# Patient Record
Sex: Female | Born: 1935
Health system: Southern US, Community
[De-identification: ages and names within clinical notes are randomized; demographics above are authoritative.]

## PROBLEM LIST (undated history)

## (undated) DIAGNOSIS — Z9289 Personal history of other medical treatment: Secondary | ICD-10-CM

## (undated) DIAGNOSIS — F329 Major depressive disorder, single episode, unspecified: Secondary | ICD-10-CM

## (undated) DIAGNOSIS — M545 Low back pain, unspecified: Secondary | ICD-10-CM

## (undated) DIAGNOSIS — B029 Zoster without complications: Secondary | ICD-10-CM

## (undated) DIAGNOSIS — R438 Other disturbances of smell and taste: Secondary | ICD-10-CM

## (undated) DIAGNOSIS — E039 Hypothyroidism, unspecified: Secondary | ICD-10-CM

## (undated) DIAGNOSIS — R7301 Impaired fasting glucose: Secondary | ICD-10-CM

## (undated) DIAGNOSIS — K589 Irritable bowel syndrome without diarrhea: Secondary | ICD-10-CM

## (undated) DIAGNOSIS — E559 Vitamin D deficiency, unspecified: Secondary | ICD-10-CM

## (undated) DIAGNOSIS — E041 Nontoxic single thyroid nodule: Secondary | ICD-10-CM

## (undated) DIAGNOSIS — R011 Cardiac murmur, unspecified: Secondary | ICD-10-CM

## (undated) DIAGNOSIS — I421 Obstructive hypertrophic cardiomyopathy: Secondary | ICD-10-CM

## (undated) DIAGNOSIS — I10 Essential (primary) hypertension: Secondary | ICD-10-CM

## (undated) DIAGNOSIS — M5416 Radiculopathy, lumbar region: Secondary | ICD-10-CM

## (undated) DIAGNOSIS — K219 Gastro-esophageal reflux disease without esophagitis: Secondary | ICD-10-CM

## (undated) DIAGNOSIS — F32A Depression, unspecified: Secondary | ICD-10-CM

## (undated) DIAGNOSIS — Z780 Asymptomatic menopausal state: Secondary | ICD-10-CM

## (undated) HISTORY — DX: Major depressive disorder, single episode, unspecified: F32.9

## (undated) HISTORY — DX: Obstructive hypertrophic cardiomyopathy: I42.1

## (undated) HISTORY — DX: Vitamin D deficiency, unspecified: E55.9

## (undated) HISTORY — PX: CATARACT EXTRACTION: SUR2

## (undated) HISTORY — DX: Irritable bowel syndrome, unspecified: K58.9

## (undated) HISTORY — PX: GLAUCOMA SURGERY: SHX656

## (undated) HISTORY — DX: Essential (primary) hypertension: I10

## (undated) HISTORY — DX: Cardiac murmur, unspecified: R01.1

## (undated) HISTORY — PX: ABDOMINAL HYSTERECTOMY: SHX81

## (undated) HISTORY — DX: Low back pain, unspecified: M54.50

## (undated) HISTORY — DX: Hypothyroidism, unspecified: E03.9

## (undated) HISTORY — DX: Low back pain: M54.5

## (undated) HISTORY — PX: THYROIDECTOMY: SHX17

## (undated) HISTORY — PX: BREAST BIOPSY: SHX20

## (undated) HISTORY — PX: OTHER SURGICAL HISTORY: SHX169

## (undated) HISTORY — DX: Gastro-esophageal reflux disease without esophagitis: K21.9

## (undated) HISTORY — DX: Other disturbances of smell and taste: R43.8

## (undated) HISTORY — DX: Impaired fasting glucose: R73.01

## (undated) HISTORY — DX: Radiculopathy, lumbar region: M54.16

## (undated) HISTORY — DX: Personal history of other medical treatment: Z92.89

## (undated) HISTORY — DX: Depression, unspecified: F32.A

## (undated) HISTORY — DX: Nontoxic single thyroid nodule: E04.1

## (undated) HISTORY — DX: Zoster without complications: B02.9

## (undated) HISTORY — DX: Asymptomatic menopausal state: Z78.0

---

## 2000-08-23 ENCOUNTER — Encounter: Admission: RE | Admit: 2000-08-23 | Discharge: 2000-08-23 | Payer: Self-pay | Admitting: Internal Medicine

## 2000-08-23 ENCOUNTER — Encounter: Payer: Self-pay | Admitting: Internal Medicine

## 2004-12-26 ENCOUNTER — Ambulatory Visit (HOSPITAL_COMMUNITY): Admission: RE | Admit: 2004-12-26 | Discharge: 2004-12-26 | Payer: Self-pay | Admitting: Gastroenterology

## 2010-08-07 ENCOUNTER — Encounter: Admission: RE | Admit: 2010-08-07 | Discharge: 2010-08-07 | Payer: Self-pay | Admitting: Internal Medicine

## 2011-03-09 NOTE — Op Note (Signed)
NAMEGLENNA, Vickie Mckee               ACCOUNT NO.:  [id number]   MEDICAL RECORD NO.:  [id number]          PATIENT TYPE:  AMB   LOCATION:  ENDO                         FACILITY:  [facility]   PHYSICIAN:  Danise Edge, M.D.   DATE OF BIRTH:  1930/11/17   DATE OF PROCEDURE:  12/26/2004  DATE OF DISCHARGE:                                 OPERATIVE REPORT   PROCEDURE:  Incomplete colonoscopy.   INDICATIONS:  [name] is a 77 year old female born June 22, 1931. [name] was scheduled to undergo a screening colonoscopy with  polypectomy to prevent colon cancer today. Due to colonic loop formation, I  was able to perform a proctocolonoscopy to the splenic flexure.  Ms.  Dutta has undergone a hysterectomy in the past and adhesions may have  prevented advancement of the endoscope to the cecum.   ENDOSCOPIST:  Danise Edge, M.D.   PREMEDICATION:  Versed 7 mg, Demerol 50 mg.   DESCRIPTION OF PROCEDURE:  After obtaining informed consent Ms. Wimberly was  placed in the left lateral decubitus position. I administered intravenous  Demerol and intravenous Versed to achieve conscious sedation for the  procedure. The patient's blood pressure, oxygen saturation and cardiac  rhythm were monitored throughout the procedure and documented in the medical  record.   Anal inspection and digital rectal exam were normal. The Olympus adjustable  pediatric colonoscope was introduced into the rectum and advanced to  approximately 70 cm from the anal verge which is near the splenic flexure.  Due to colonic loop formation which could not be controlled by applying  external abdominal pressure and repositioning the patient from the left  lateral decubitus position to the supine position and finally the right  lateral decubitus position. A complete colonoscopy was not performed.  Colonic preparation for the exam today was satisfactory at best.   Endoscopic appearance of the rectum was normal.  Endoscopic appearance of the  left colon with the endoscope advanced to 70 cm from the anal verge reveals  extensive colonic diverticulosis but no colonic neoplasia.   ASSESSMENT:  1.  Incomplete proctocolonoscopy to the splenic flexure. A complete      colonoscopy was not performed due to colonic loop formation.  2.  Extensive left colonic diverticulosis but no endoscopic evidence for the      presence of colorectal neoplasia by flexible Proctocolonoscopy to the      splenic flexure.   RECOMMENDATIONS:  Yearly stool Hemoccult card testing. Repeat flexible  proctosigmoidoscopy in 5 years.   If it is important for Ms. Merlin to have her entire colon visualized for  colon polyps, I would recommend scheduling a virtual colonoscopy.      MJ/MEDQ  D:  12/26/2004  T:  12/26/2004  Job:  [id number]   cc:   Theressa Millard, M.D.  [address] [place]  [place] [address]  Fax: [phone]

## 2011-10-29 DIAGNOSIS — R42 Dizziness and giddiness: Secondary | ICD-10-CM | POA: Diagnosis not present

## 2011-10-29 DIAGNOSIS — I10 Essential (primary) hypertension: Secondary | ICD-10-CM | POA: Diagnosis not present

## 2012-01-02 DIAGNOSIS — M25549 Pain in joints of unspecified hand: Secondary | ICD-10-CM | POA: Diagnosis not present

## 2012-01-21 DIAGNOSIS — M6530 Trigger finger, unspecified finger: Secondary | ICD-10-CM | POA: Diagnosis not present

## 2012-02-25 DIAGNOSIS — M19049 Primary osteoarthritis, unspecified hand: Secondary | ICD-10-CM | POA: Diagnosis not present

## 2012-02-25 DIAGNOSIS — M6530 Trigger finger, unspecified finger: Secondary | ICD-10-CM | POA: Diagnosis not present

## 2012-03-03 DIAGNOSIS — Z0000 Encounter for general adult medical examination without abnormal findings: Secondary | ICD-10-CM | POA: Diagnosis not present

## 2012-03-03 DIAGNOSIS — Z1331 Encounter for screening for depression: Secondary | ICD-10-CM | POA: Diagnosis not present

## 2012-03-03 DIAGNOSIS — I10 Essential (primary) hypertension: Secondary | ICD-10-CM | POA: Diagnosis not present

## 2012-03-26 DIAGNOSIS — M6530 Trigger finger, unspecified finger: Secondary | ICD-10-CM | POA: Diagnosis not present

## 2012-08-07 DIAGNOSIS — Z23 Encounter for immunization: Secondary | ICD-10-CM | POA: Diagnosis not present

## 2012-09-03 DIAGNOSIS — I10 Essential (primary) hypertension: Secondary | ICD-10-CM | POA: Diagnosis not present

## 2012-09-03 DIAGNOSIS — R42 Dizziness and giddiness: Secondary | ICD-10-CM | POA: Diagnosis not present

## 2012-09-03 DIAGNOSIS — F4321 Adjustment disorder with depressed mood: Secondary | ICD-10-CM | POA: Diagnosis not present

## 2013-03-05 DIAGNOSIS — Z0000 Encounter for general adult medical examination without abnormal findings: Secondary | ICD-10-CM | POA: Diagnosis not present

## 2013-03-05 DIAGNOSIS — I10 Essential (primary) hypertension: Secondary | ICD-10-CM | POA: Diagnosis not present

## 2013-03-05 DIAGNOSIS — M810 Age-related osteoporosis without current pathological fracture: Secondary | ICD-10-CM | POA: Diagnosis not present

## 2013-03-05 DIAGNOSIS — R634 Abnormal weight loss: Secondary | ICD-10-CM | POA: Diagnosis not present

## 2013-03-05 DIAGNOSIS — F339 Major depressive disorder, recurrent, unspecified: Secondary | ICD-10-CM | POA: Diagnosis not present

## 2013-03-10 DIAGNOSIS — I10 Essential (primary) hypertension: Secondary | ICD-10-CM | POA: Diagnosis not present

## 2013-08-13 DIAGNOSIS — Z23 Encounter for immunization: Secondary | ICD-10-CM | POA: Diagnosis not present

## 2013-08-26 DIAGNOSIS — I10 Essential (primary) hypertension: Secondary | ICD-10-CM | POA: Diagnosis not present

## 2013-08-26 DIAGNOSIS — R5381 Other malaise: Secondary | ICD-10-CM | POA: Diagnosis not present

## 2014-03-09 DIAGNOSIS — I10 Essential (primary) hypertension: Secondary | ICD-10-CM | POA: Diagnosis not present

## 2014-03-09 DIAGNOSIS — Z0000 Encounter for general adult medical examination without abnormal findings: Secondary | ICD-10-CM | POA: Diagnosis not present

## 2014-03-09 DIAGNOSIS — M899 Disorder of bone, unspecified: Secondary | ICD-10-CM | POA: Diagnosis not present

## 2014-03-09 DIAGNOSIS — E041 Nontoxic single thyroid nodule: Secondary | ICD-10-CM | POA: Diagnosis not present

## 2014-03-09 DIAGNOSIS — Z23 Encounter for immunization: Secondary | ICD-10-CM | POA: Diagnosis not present

## 2014-03-09 DIAGNOSIS — E559 Vitamin D deficiency, unspecified: Secondary | ICD-10-CM | POA: Diagnosis not present

## 2014-03-09 DIAGNOSIS — F339 Major depressive disorder, recurrent, unspecified: Secondary | ICD-10-CM | POA: Diagnosis not present

## 2014-03-16 DIAGNOSIS — E041 Nontoxic single thyroid nodule: Secondary | ICD-10-CM | POA: Diagnosis not present

## 2014-03-16 DIAGNOSIS — I10 Essential (primary) hypertension: Secondary | ICD-10-CM | POA: Diagnosis not present

## 2014-03-16 DIAGNOSIS — E559 Vitamin D deficiency, unspecified: Secondary | ICD-10-CM | POA: Diagnosis not present

## 2014-06-11 DIAGNOSIS — R5383 Other fatigue: Secondary | ICD-10-CM | POA: Diagnosis not present

## 2014-06-11 DIAGNOSIS — R42 Dizziness and giddiness: Secondary | ICD-10-CM | POA: Diagnosis not present

## 2014-06-11 DIAGNOSIS — R011 Cardiac murmur, unspecified: Secondary | ICD-10-CM | POA: Diagnosis not present

## 2014-06-11 DIAGNOSIS — R5381 Other malaise: Secondary | ICD-10-CM | POA: Diagnosis not present

## 2014-06-16 ENCOUNTER — Other Ambulatory Visit (HOSPITAL_COMMUNITY): Payer: Self-pay | Admitting: Internal Medicine

## 2014-06-16 ENCOUNTER — Ambulatory Visit (HOSPITAL_COMMUNITY): Payer: Medicare Other | Attending: Internal Medicine | Admitting: Radiology

## 2014-06-16 DIAGNOSIS — I517 Cardiomegaly: Secondary | ICD-10-CM | POA: Diagnosis not present

## 2014-06-16 DIAGNOSIS — R011 Cardiac murmur, unspecified: Secondary | ICD-10-CM

## 2014-06-16 NOTE — Progress Notes (Signed)
Echocardiogram performed.  

## 2014-06-25 DIAGNOSIS — R011 Cardiac murmur, unspecified: Secondary | ICD-10-CM | POA: Diagnosis not present

## 2014-07-23 ENCOUNTER — Encounter: Payer: Self-pay | Admitting: Interventional Cardiology

## 2014-07-23 ENCOUNTER — Ambulatory Visit (INDEPENDENT_AMBULATORY_CARE_PROVIDER_SITE_OTHER): Payer: Medicare Other | Admitting: Interventional Cardiology

## 2014-07-23 VITALS — BP 140/80 | HR 91 | Ht 60.0 in | Wt 140.0 lb

## 2014-07-23 DIAGNOSIS — R29898 Other symptoms and signs involving the musculoskeletal system: Secondary | ICD-10-CM | POA: Diagnosis not present

## 2014-07-23 DIAGNOSIS — R011 Cardiac murmur, unspecified: Secondary | ICD-10-CM | POA: Insufficient documentation

## 2014-07-23 DIAGNOSIS — I10 Essential (primary) hypertension: Secondary | ICD-10-CM

## 2014-07-23 DIAGNOSIS — I421 Obstructive hypertrophic cardiomyopathy: Secondary | ICD-10-CM

## 2014-07-23 MED ORDER — METOPROLOL TARTRATE 25 MG PO TABS: 25.0000 mg | ORAL_TABLET | Freq: Two times a day (BID) | ORAL | Status: DC

## 2014-07-23 NOTE — Patient Instructions (Signed)
Your physician has recommended you make the following change in your medication:   1. Stop Amlodipine.   2. Start Metoprolol 25 mg 1 tablet twice a day.  Your physician has requested that you regularly monitor and record your blood pressure readings at home. Please use the same machine at the same time of day to check your readings and record them to bring to your follow-up visit. Please call next week with BP Readings.  Your physician recommends that you schedule a follow-up appointment in: 2 weeks with Dr. Irish Lack and/or PA/NP.

## 2014-07-23 NOTE — Progress Notes (Signed)
Patient ID: Vickie Mckee, female   DOB: 1930-11-24, 80 y.o.   MRN: [id number]     Patient ID: Vickie Mckee MRN: [id number] DOB/AGE: 1931-05-07 80 y.o.   Referring Physician Dr. Delfina Redwood   Reason for Consultation  : Murmur  HPI: 80 year old woman who was found to have a heart murmur. She underwent echocardiogram which showed LVOT gradient, worse with Valsalva. She has not had syncope. She hasn't generalized weakness, particularly in her legs. This limits walking. She denies any chest discomfort. She does not exercise much due to leg weakness. Her weakness started after her husband died a year ago.  She has been on blood pressure medication for several years.  She is not doing any regular exercise at this time.   Current Outpatient Prescriptions  Medication Sig Dispense Refill  . amLODipine (NORVASC) 5 MG tablet Take 5 mg by mouth daily.       Marland Kitchen aspirin 81 MG tablet Take 81 mg by mouth daily.      . Calcium Carbonate-Vitamin D (RA CALCIUM PLUS VITAMIN D) 600-400 MG-UNIT per tablet Take 1 tablet by mouth 2 (two) times daily.      Marland Kitchen glucosamine-chondroitin 500-400 MG tablet Take 2 tablets by mouth daily.      . hydrochlorothiazide (HYDRODIURIL) 25 MG tablet Take 25 mg by mouth daily.       Marland Kitchen levothyroxine (SYNTHROID, LEVOTHROID) 75 MCG tablet Take 75 mcg by mouth daily before breakfast.       . meclizine (ANTIVERT) 25 MG tablet Take 12.5 mg by mouth 3 (three) times daily as needed for dizziness.      . Multiple Vitamin (MULTIVITAMIN) tablet Take 1 tablet by mouth daily. 40 mg      . NEXIUM 40 MG capsule Take 40 mg by mouth daily at 12 noon.       Marland Kitchen perphenazine-amitriptyline (ETRAFON/TRIAVIL) 4-10 MG TABS Take 1 tablet by mouth daily. depression      . PREMARIN 0.3 MG tablet Take 0.3 mg by mouth daily. 1/2 TABLET BY MOUTH DAILY      . Vitamin D, Ergocalciferol, (DRISDOL) 50000 UNITS CAPS capsule Take 50,000 Units by mouth every 7 (seven) days.       No current facility-administered  medications for this visit.   Past Medical History  Diagnosis Date  . Low back pain   . GERD (gastroesophageal reflux disease)   . Depression   . Thyroid nodule   . Postmenopausal     with osteopenia, bisphosphonates 1/05-1/11, improved osteopenia 4/12-repeat planned late 2015  . Vitamin D deficiency     repleted on weekly vitamin D 50000 iu  . IBS (irritable bowel syndrome)   . Decreased sense of smell     and taste-negative CT scan-2011  . Lumbar radiculopathy     with negative MRI (1991)  . Impaired fasting glucose   . Heart murmur     heard first time 4/12  . Shingles     2015  . History of mammogram     2010 and she does not want to repeat them anymore    Family History  Problem Relation Age of Onset  . Breast cancer Mother   . Hypertension Father   . CVA Father   . Heart failure Sister   . CAD Sister   . Kidney disease Sister   . COPD Brother   . Lung cancer Brother   . CAD Brother   . Diabetes Brother   . Stroke Father   .  Heart attack Neg Hx     History   Social History  . Marital Status: Married    Spouse Name: N/A    Number of Children: N/A  . Years of Education: N/A   Occupational History  . Not on file.   Social History Main Topics  . Smoking status: Never Smoker   . Smokeless tobacco: Not on file  . Alcohol Use: No  . Drug Use: Not on file  . Sexual Activity: Not on file   Other Topics Concern  . Not on file   Social History Narrative  . No narrative on file    Past Surgical History  Procedure Laterality Date  . Cataract extraction      removal with IOL bilateral  . Breast biopsy      x2  . Thyroidectomy      subtotal  . Skin cancer removal    . Glaucoma surgery      laser  . Abdominal hysterectomy        (Not in a hospital admission)  Review of systems complete and found to be negative unless listed above .  No nausea, vomiting.  No fever chills, No focal weakness,  No palpitations.  Physical Exam: Filed Vitals:    07/23/14 0944  BP: 140/80  Pulse: 91    Weight: 140 lb (63.504 kg)  Physical exam:  [place]/AT EOMI No JVD, No carotid bruit RRR F8H8  3/6 systolic murmur, worse with Valsalva No wheezing Soft. NT, nondistended No edema. No focal motor or sensory deficits Normal affect  Labs:   No results found for this basename: WBC, HGB, HCT, MCV, PLT   No results found for this basename: NA, K, CL, CO2, BUN, CREATININE, CALCIUM, LABALBU, PROT, BILITOT, ALKPHOS, ALT, AST, GLUCOSE,  in the last 168 hours No results found for this basename: CKTOTAL, CKMB, CKMBINDEX, TROPONINI    No results found for this basename: CHOL   No results found for this basename: HDL   No results found for this basename: LDLCALC   No results found for this basename: TRIG   No results found for this basename: CHOLHDL   No results found for this basename: LDLDIRECT      Radiology: EKG:  Normal sinus rhythm, LVH, associated ST segment changes  ASSESSMENT AND PLAN:   HOCM: First step is to stop vasodilator amlodipine.  WIll try negative inotrope, metoprolol.  If BP increases on metoprolol, would have to consider adding diltiazem or verapamil.  Goal is to slow heart rate and to use negative intropic effect to decrease LVOT obstruction. Doubt that we will need any invasive therapy for this obstruction given her age and overall status.  HTN: Borderline control.  May need to stop diuretic at some point given HOCM.  No edema in the past off of HCTZ per her report.  : The blood pressure readings next week. In 2 weeks, she should followup   in the office for blood pressure check.  Weakness: Leg weakness not related to HOCM.   Signed:   Mina Marble, MD, [facility] 07/23/2014, 10:43 AM

## 2014-08-02 DIAGNOSIS — Z23 Encounter for immunization: Secondary | ICD-10-CM | POA: Diagnosis not present

## 2014-08-06 ENCOUNTER — Ambulatory Visit (INDEPENDENT_AMBULATORY_CARE_PROVIDER_SITE_OTHER): Payer: Medicare Other | Admitting: Physician Assistant

## 2014-08-06 ENCOUNTER — Encounter: Payer: Self-pay | Admitting: Physician Assistant

## 2014-08-06 VITALS — BP 168/70 | HR 72 | Ht 60.0 in | Wt 140.0 lb

## 2014-08-06 DIAGNOSIS — I10 Essential (primary) hypertension: Secondary | ICD-10-CM | POA: Diagnosis not present

## 2014-08-06 DIAGNOSIS — I421 Obstructive hypertrophic cardiomyopathy: Secondary | ICD-10-CM | POA: Diagnosis not present

## 2014-08-06 DIAGNOSIS — R531 Weakness: Secondary | ICD-10-CM

## 2014-08-06 DIAGNOSIS — E039 Hypothyroidism, unspecified: Secondary | ICD-10-CM | POA: Diagnosis not present

## 2014-08-06 LAB — BASIC METABOLIC PANEL
BUN: 15 mg/dL (ref 6–23)
CO2: 29 meq/L (ref 19–32)
CREATININE: 1.1 mg/dL (ref 0.4–1.2)
Calcium: 9.4 mg/dL (ref 8.4–10.5)
Chloride: 100 mEq/L (ref 96–112)
GFR: 50.65 mL/min — ABNORMAL LOW (ref >60.00)
GLUCOSE: 100 mg/dL — AB (ref 70–99)
Potassium: 3.7 mEq/L (ref 3.5–5.1)
Sodium: 134 mEq/L — ABNORMAL LOW (ref 135–145)

## 2014-08-06 LAB — TSH: TSH: 1.05 u[IU]/mL (ref 0.35–4.50)

## 2014-08-06 MED ORDER — METOPROLOL SUCCINATE ER 50 MG PO TB24: 50.0000 mg | ORAL_TABLET | Freq: Two times a day (BID) | ORAL | Status: DC

## 2014-08-06 NOTE — Progress Notes (Signed)
[place] [place], [place]  [address] Phone: [phone] Fax:  [phone]  Date:  08/06/2014   Patient ID:  Vickie Mckee, Vickie Mckee 01/01/1931, MRN [id number]   PCP:  Valaria Good, MD  Cardiologist:  Dr. Irish Lack   History of Present Illness: Vickie Mckee is a 79 y.o. female with history of HTN, hypothyroidism, and HOCM who presents for clinic followup. She had an echo 05/2014 for a history of murmur which showed moderate concentric and severe basal hypertrophy with LVOT gradient 24->80 with Valsalva, EF 65-70%, grade 1 d/d, high ventricular fillings pressure, mild AI. She was seen by Dr. Irish Lack for her initial consult 07/23/14 at which time she noted generalized weakness particularly in her legs since her husband died a year ago. Amlodipine was stopped. Metoprolol tartrate 25mg  BID was added. Dr. Irish Lack considered stopping diuretic therapy but wanted her to f/u today to see how her BP was doing. He did not feel her leg weakness was due to HOCM.   Today she brings in a log of her BPs which mostly range 160-170/90s. This is consistent with the reading in clinic today. She denies any CP, SOB, nausea, near syncope or syncope. She is doing low grade exercises in a senior program to help strengthen her legs.  2D echo 05/2014 - Left ventricle: There is moderate concentric and severe basal septal hypertrophy with LVOT gradient 24 mmHg that increases to 80 mmHg with Valsalva maneuver. The cavity size was normal. There was moderate concentric hypertrophy. Systolic function was vigorous. The estimated ejection fraction was in the range of 65% to 70%. There was dynamic obstruction during Valsalvaat an indeterminate location, with a peak velocity of 448 cm/sec and a peak gradient of 80 mm Hg. Wall motion was normal; there were no regional wall motion abnormalities. Doppler parameters are consistent with abnormal left ventricular relaxation (grade 1 diastolic dysfunction). Doppler  parameters are consistent with high ventricular filling pressure. - Aortic valve: Trileaflet; mildly thickened, mildly calcified leaflets. Transvalvular velocity was within the normal range. There was no stenosis. There was mild regurgitation. - Aortic root: The aortic root was normal in size. - Mitral valve: Structurally normal valve. There was no regurgitation. - Right atrium: The atrium was normal in size. - Pulmonic valve: There was no regurgitation. - Pulmonary arteries: Systolic pressure was within the normal range. - Pericardium, extracardiac: There was no pericardial effusion. Impressions: - Severe basal septal hypertrophy with siggnificant LVOT gradient 24 mmHg that increases to 80 mmHg with Valsalva maneuver.   Recent Labs: No results found for requested labs within last 365 days.  Wt Readings from Last 3 Encounters:  08/06/14 140 lb (63.504 kg)  07/23/14 140 lb (63.504 kg)     Past Medical History  Diagnosis Date  . Low back pain   . GERD (gastroesophageal reflux disease)   . Depression   . Thyroid nodule   . Postmenopausal     with osteopenia, bisphosphonates 1/05-1/11, improved osteopenia 4/12-repeat planned late 2015  . Vitamin D deficiency     repleted on weekly vitamin D 50000 iu  . IBS (irritable bowel syndrome)   . Decreased sense of smell     and taste-negative CT scan-2011  . Lumbar radiculopathy     with negative MRI (1991)  . Impaired fasting glucose   . Heart murmur     heard first time 4/12  . Shingles     2015  . History of mammogram  2010 and she does not want to repeat them anymore  . Essential hypertension   . HOCM (hypertrophic obstructive cardiomyopathy)     a. Dx by echo 05/2014.    Current Outpatient Prescriptions  Medication Sig Dispense Refill  . aspirin 81 MG tablet Take 81 mg by mouth daily.      . Calcium Carbonate-Vitamin D (RA CALCIUM PLUS VITAMIN D) 600-400 MG-UNIT per tablet Take 1 tablet by mouth 2 (two) times daily.       Marland Kitchen glucosamine-chondroitin 500-400 MG tablet Take 2 tablets by mouth daily.      Marland Kitchen levothyroxine (SYNTHROID, LEVOTHROID) 75 MCG tablet Take 75 mcg by mouth daily before breakfast.       . meclizine (ANTIVERT) 25 MG tablet Take 12.5 mg by mouth 3 (three) times daily as needed for dizziness.      . Multiple Vitamin (MULTIVITAMIN) tablet Take 1 tablet by mouth daily. 40 mg      . NEXIUM 40 MG capsule Take 40 mg by mouth daily at 12 noon.       Marland Kitchen perphenazine-amitriptyline (ETRAFON/TRIAVIL) 4-10 MG TABS Take 1 tablet by mouth daily. depression      . PREMARIN 0.3 MG tablet 1/2 TABLET BY MOUTH DAILY      . Vitamin D, Ergocalciferol, (DRISDOL) 50000 UNITS CAPS capsule Take 50,000 Units by mouth every 7 (seven) days.      . metoprolol succinate (TOPROL-XL) 50 MG 24 hr tablet Take 1 tablet (50 mg total) by mouth 2 (two) times daily. Take with or immediately following a meal.  60 tablet  1   No current facility-administered medications for this visit.    Allergies:   Codeine   Social History:  The patient  reports that she has never smoked. She does not have any smokeless tobacco history on file. She reports that she does not drink alcohol.   Family History:  The patient's family history includes Breast cancer in her mother; CAD in her brother and sister; COPD in her brother; CVA in her father; Diabetes in her brother; Heart failure in her sister; Hypertension in her father; Kidney disease in her sister; Lung cancer in her brother; Stroke in her father. There is no history of Heart attack.   ROS:  Please see the history of present illness.    All other systems reviewed and negative.   PHYSICAL EXAM:  VS:  BP 168/70  Pulse 72  Ht 5' (1.524 m)  Wt 140 lb (63.504 kg)  BMI 27.34 kg/m2 Well nourished, well developed WF in no acute distress HEENT: normal Neck: no JVD Cardiac:  normal S1, S2; RRR; soft SEM, not significantly changed with Valsalva Lungs:  clear to auscultation bilaterally, no  wheezing, rhonchi or rales Abd: soft, nontender, no hepatomegaly Ext: no edema Skin: warm and dry Neuro:  moves all extremities spontaneously, no focal abnormalities noted   ASSESSMENT AND PLAN:  1. HOCM - I discussed the patient's echo with Dr. Aundra Dubin today in Dr. Hassell Done absence. He recommended to stop HCTZ and increase metoprolol to 50mg  BID, but thinks that Toprol XL will provide better 24-hour blood pressure coverage for this patient. I have discontinued Lopressor and prescribed Toprol XL 50mg  BID. She is aware this is a totally different dose/tablet. Willl ask her to check BP daily and call us in 1 week with result. If still elevated, would consider addition of verapamil/diltiazem.  2. HTN - see above. She has not had any recent labwork. Will check BMET and  TSH in case these need to guide our dosing further. 3. Leg weakness - I agree with Dr. Okey Regal that this does not seem related to her cardiac issues. F/u PCP.  Dispo: F/u 2 months with Dr. Irish Lack or myself. I also told her I would talk to our team about how to screen her family members. She has 2 daughters with no known prior cardiac disease.  Signed, Melina Copa, PA-C  08/06/2014 1:05 PM

## 2014-08-06 NOTE — Patient Instructions (Addendum)
Your physician has recommended you make the following change in your medication:   STOP TAKING HYDROCHLOROTHIAZIDE  STOP TAKING LOPRESSOR  START METOPROLOL XL 50 MG  TWICE A DAY   Your physician recommends that you schedule a follow-up appointment in: IN 2 MONTHS WITH DR [place] IF POSSIBLE    Your physician recommends that you return for lab work  TODAY BMET AND TSH   CHECK BLOOD PRESSURE FOR ONE WEEK AND CALL us WITH RESULTS   [place] TO DR [place]

## 2014-08-09 ENCOUNTER — Telehealth: Payer: Self-pay | Admitting: *Deleted

## 2014-08-09 NOTE — Telephone Encounter (Signed)
pt notified about lab results with verbal understanding  

## 2014-08-12 ENCOUNTER — Telehealth: Payer: Self-pay | Admitting: Physician Assistant

## 2014-08-12 NOTE — Telephone Encounter (Addendum)
I spoke to Dr. Caryl Comes about the patient's history of hypertrophic obstructive cardiomyopathy with regard to screening her daughters for the disease. He recommended that her children have an echocardiogram to further assess for the disease since it can be passed down. Please let the patient know. Thanks! Raymon Schlarb PA-C

## 2014-08-13 ENCOUNTER — Telehealth: Payer: Self-pay | Admitting: Interventional Cardiology

## 2014-08-13 NOTE — Telephone Encounter (Signed)
For Dr. Hassell Done review.

## 2014-08-13 NOTE — Telephone Encounter (Signed)
No answer. I will try to reach pt on Monday 10/26 and go over recommendations per Melina Copa, PA and Dr. Caryl Comes

## 2014-08-13 NOTE — Telephone Encounter (Signed)
New problem   Pt is calling to report her BP:  08/07/14      152/88 08/08/14      167/95 08/09/14      173/98 08/10/14      164/88 08/11/14      170/96 08/12/14      170/103 08/13/14      162/87

## 2014-08-14 NOTE — Telephone Encounter (Signed)
Start diltiazem CD 120 mg daily.

## 2014-08-16 ENCOUNTER — Telehealth: Payer: Self-pay | Admitting: *Deleted

## 2014-08-16 MED ORDER — DILTIAZEM HCL ER COATED BEADS 120 MG PO CP24: 120.0000 mg | ORAL_CAPSULE | Freq: Every day | ORAL | Status: DC

## 2014-08-16 NOTE — Telephone Encounter (Signed)
Patient informed of Dr. Hassell Done recommendations to begin Diltiazem CD 120mg .  Order sent to [facility] in [place]. She sees Truitt Merle NP in December.

## 2014-08-16 NOTE — Telephone Encounter (Signed)
Line was busy

## 2014-08-16 NOTE — Telephone Encounter (Signed)
s/w pt today and she has been made aware of per Dr. Caryl Comes that pt's daughters should get echo's due to HOCM in the family. Pt asked should they just go their own dr for this. I said yes and if they need to refer to [place] they can do that then. Pt said ok.

## 2014-08-20 ENCOUNTER — Telehealth: Payer: Self-pay | Admitting: Interventional Cardiology

## 2014-08-20 NOTE — Telephone Encounter (Signed)
Pt called to give readings...   08/20/2014 @ 9 am BP   10/24    168/108  10/25    167/197 10/26    179/89 10/27    165/101 10/28    177/92 10/29    159/93 10/30    156/88

## 2014-08-20 NOTE — Telephone Encounter (Signed)
Pt called her BP reading written bellow. Pt started taking the Diltiazem CD 120 mg three days ago. Pt takes her medication with breakfast between 7 and 8 AM BP has taken about 9:00 AM..

## 2014-08-21 NOTE — Telephone Encounter (Signed)
Increase diltiazem to 240 mg daily.  Please give [place] pulse/HR readings as well in the next week.

## 2014-08-23 NOTE — Telephone Encounter (Signed)
I attempted to call the patient back with Dr. Hassell Done recommendations. No answer/ no machine. I will call her back at a later time.

## 2014-08-25 NOTE — Telephone Encounter (Signed)
I spoke with the patient. She is aware of Dr. Irish Lack recommendations to increase diltiazem to 240 mg daily. She will do this and record BP/ HR readings. She will call back in 1 week with her readings.

## 2014-08-31 DIAGNOSIS — M810 Age-related osteoporosis without current pathological fracture: Secondary | ICD-10-CM | POA: Diagnosis not present

## 2014-08-31 DIAGNOSIS — I10 Essential (primary) hypertension: Secondary | ICD-10-CM | POA: Diagnosis not present

## 2014-09-30 ENCOUNTER — Other Ambulatory Visit: Payer: Self-pay | Admitting: Physician Assistant

## 2014-10-06 ENCOUNTER — Ambulatory Visit (INDEPENDENT_AMBULATORY_CARE_PROVIDER_SITE_OTHER): Payer: Medicare Other | Admitting: Nurse Practitioner

## 2014-10-06 ENCOUNTER — Encounter: Payer: Self-pay | Admitting: Nurse Practitioner

## 2014-10-06 VITALS — BP 140/82 | HR 74 | Ht 59.0 in | Wt 134.1 lb

## 2014-10-06 DIAGNOSIS — I421 Obstructive hypertrophic cardiomyopathy: Secondary | ICD-10-CM

## 2014-10-06 DIAGNOSIS — I10 Essential (primary) hypertension: Secondary | ICD-10-CM | POA: Diagnosis not present

## 2014-10-06 DIAGNOSIS — R531 Weakness: Secondary | ICD-10-CM | POA: Diagnosis not present

## 2014-10-06 MED ORDER — DILTIAZEM HCL ER COATED BEADS 240 MG PO CP24: 240.0000 mg | ORAL_CAPSULE | Freq: Every day | ORAL | Status: DC

## 2014-10-06 NOTE — Patient Instructions (Addendum)
I think you are doing well  Stay on your current medicines -  I have sent in the 240 mg of Diltiazem to take once a day  See Dr. Irish Lack in 4 months  You have "HOCM" -  Hypertrophic cardiomyopathy  Call the [place] office at [phone] if you have any questions, problems or concerns.

## 2014-10-06 NOTE — Progress Notes (Signed)
Vickie Mckee Date of Birth: 1931-09-25 Medical Record #[id number]  History of Present Illness: Vickie Mckee is seen back today for a follow up visit. Seen for Dr. Irish Mckee. She a 80 y.o. female with history of HTN, hypothyroidism, and HOCM. She had an echo 05/2014 for a history of murmur which showed moderate concentric and severe basal hypertrophy with LVOT gradient 24->80 with Valsalva, EF 65-70%, grade 1 d/d, high ventricular fillings pressure, mild AI.   She was seen by Dr. Irish Mckee for her initial consult 07/23/14 at which time she noted generalized weakness particularly in her legs since her husband died a year ago. Amlodipine was stopped. Metoprolol tartrate 25mg  BID was added. He did not feel her leg weakness was due to HOCM.   Last seen in October by Vickie Ku, PA. Based on echo results - HCTZ was stopped and she was changed to Toprol. To consider Verapamil/Diltiazem on follow up.  Comes in today. Here with her 2 twin daughters. She is doing well. No chest pain. Breathing is good. Not lightheaded or dizzy. No syncope. BP doing well. She feels good on her medicine. One of her twin girls had a syncopal spell earlier this year and was seen and evaluated with an echo and was negative for HOCM. Continues with some weakness of her legs - chronic issue.  Current Outpatient Prescriptions  Medication Sig Dispense Refill  . aspirin 81 MG tablet Take 81 mg by mouth daily.    . Calcium Carbonate-Vitamin D (RA CALCIUM PLUS VITAMIN D) 600-400 MG-UNIT per tablet Take 1 tablet by mouth 2 (two) times daily.    Marland Kitchen diltiazem (CARDIZEM CD) 120 MG 24 hr capsule Take two capsules (240 mg) by mouth once daily    . glucosamine-chondroitin 500-400 MG tablet Take 2 tablets by mouth daily.    Marland Kitchen levothyroxine (SYNTHROID, LEVOTHROID) 75 MCG tablet Take 75 mcg by mouth daily before breakfast.     . meclizine (ANTIVERT) 25 MG tablet Take 12.5 mg by mouth 3 (three) times daily as needed for dizziness.    .  metoprolol succinate (TOPROL-XL) 50 MG 24 hr tablet TAKE 1 TABLET BY MOUTH TWICE DAILY TAKE WITH OR IMMEDIATELY FOLLOWING A MEAL 60 tablet 0  . Multiple Vitamin (MULTIVITAMIN) tablet Take 1 tablet by mouth daily. 40 mg    . NEXIUM 40 MG capsule Take 40 mg by mouth daily at 12 noon.     Marland Kitchen perphenazine-amitriptyline (ETRAFON/TRIAVIL) 4-10 MG TABS Take 1 tablet by mouth daily. depression    . PREMARIN 0.3 MG tablet 1/2 TABLET BY MOUTH DAILY    . Vitamin D, Ergocalciferol, (DRISDOL) 50000 UNITS CAPS capsule Take 50,000 Units by mouth every 7 (seven) days.     No current facility-administered medications for this visit.    Allergies  Allergen Reactions  . Codeine Nausea Only    Past Medical History  Diagnosis Date  . Low back pain   . GERD (gastroesophageal reflux disease)   . Depression   . Thyroid nodule   . Postmenopausal     with osteopenia, bisphosphonates 1/05-1/11, improved osteopenia 4/12-repeat planned late 2015  . Vitamin D deficiency     repleted on weekly vitamin D 50000 iu  . IBS (irritable bowel syndrome)   . Decreased sense of smell     and taste-negative CT scan-2011  . Lumbar radiculopathy     with negative MRI (1991)  . Impaired fasting glucose   . Heart murmur     heard first  time 4/12  . Shingles     2015  . History of mammogram     2010 and she does not want to repeat them anymore  . Essential hypertension   . HOCM (hypertrophic obstructive cardiomyopathy)     a. Dx by echo 05/2014.  Marland Kitchen Hypothyroidism     Past Surgical History  Procedure Laterality Date  . Cataract extraction      removal with IOL bilateral  . Breast biopsy      x2  . Thyroidectomy      subtotal  . Skin cancer removal    . Glaucoma surgery      laser  . Abdominal hysterectomy      History  Smoking status  . Never Smoker   Smokeless tobacco  . Not on file    History  Alcohol Use No    Family History  Problem Relation Age of Onset  . Breast cancer Mother   .  Hypertension Father   . CVA Father   . Heart failure Sister   . CAD Sister   . Kidney disease Sister   . COPD Brother   . Lung cancer Brother   . CAD Brother   . Diabetes Brother   . Stroke Father   . Heart attack Neg Hx     Review of Systems: The review of systems is per the HPI.  All other systems were reviewed and are negative.  Physical Exam: BP 140/82 mmHg  Pulse 74  Ht 4\' 11"  (1.499 m)  Wt 134 lb 1.9 oz (60.836 kg)  BMI 27.07 kg/m2  SpO2 97% Patient is very pleasant and in no acute distress. Weight is down 6 pounds. Skin is warm and dry. Color is normal.  HEENT is unremarkable. Normocephalic/atraumatic. PERRL. Sclera are nonicteric. Neck is supple. No masses. No JVD. Lungs are clear. Cardiac exam shows a regular rate and rhythm. Soft systolic murmur noted. Abdomen is soft. Extremities are without edema. Gait and ROM are intact. No gross neurologic deficits noted.  Wt Readings from Last 3 Encounters:  10/06/14 134 lb 1.9 oz (60.836 kg)  08/06/14 140 lb (63.504 kg)  07/23/14 140 lb (63.504 kg)    LABORATORY DATA/PROCEDURES:  Lab Results  Component Value Date   GLUCOSE 100* 08/06/2014   NA 134* 08/06/2014   K 3.7 08/06/2014   CL 100 08/06/2014   CREATININE 1.1 08/06/2014   BUN 15 08/06/2014   CO2 29 08/06/2014   TSH 1.05 08/06/2014    BNP (last 3 results) No results for input(s): PROBNP in the last 8760 hours.  2D echo 05/2014 - Left ventricle: There is moderate concentric and severe basal septal hypertrophy with LVOT gradient 24 mmHg that increases to 80 mmHg with Valsalva maneuver. The cavity size was normal. There was moderate concentric hypertrophy. Systolic function was vigorous. The estimated ejection fraction was in the range of 65% to 70%. There was dynamic obstruction during Valsalvaat an indeterminate location, with a peak velocity of 448 cm/sec and a peak gradient of 80 mm Hg. Wall motion was normal; there were no regional wall motion abnormalities.  Doppler parameters are consistent with abnormal left ventricular relaxation (grade 1 diastolic dysfunction). Doppler parameters are consistent with high ventricular filling pressure. - Aortic valve: Trileaflet; mildly thickened, mildly calcified leaflets. Transvalvular velocity was within the normal range. There was no stenosis. There was mild regurgitation. - Aortic root: The aortic root was normal in size. - Mitral valve: Structurally normal valve. There was no regurgitation. -  Right atrium: The atrium was normal in size. - Pulmonic valve: There was no regurgitation. - Pulmonary arteries: Systolic pressure was within the normal range. - Pericardium, extracardiac: There was no pericardial effusion. Impressions: - Severe basal septal hypertrophy with siggnificant LVOT gradient 24 mmHg that increases to 80 mmHg with Valsalva maneuver.  Assessment / Plan: 1 HOCM - doing well clinically. Refilled diltiazem 240 mg daily. Continue with her current regimen. Reminded about need to maintain hydration. See back in 4 months.   2. HTN -  BP is stable on her current regimen.   3. Leg weakness - chronic issue - not felt to be cardiac related.  Patient is agreeable to this plan and will call if any problems develop in the interim.   Burtis Junes, RN, [facility] [address] [place], [place]  [id number] [phone]

## 2014-11-05 ENCOUNTER — Other Ambulatory Visit: Payer: Self-pay | Admitting: Interventional Cardiology

## 2014-12-22 ENCOUNTER — Telehealth: Payer: Self-pay | Admitting: Nurse Practitioner

## 2014-12-22 NOTE — Telephone Encounter (Signed)
New message      Pt request to talk to Vickie Mckee about her dizziness.  She has it all of the time.

## 2014-12-22 NOTE — Telephone Encounter (Signed)
C/o worsening dizziness that has now included nausea x 2-3 weeks. This is to the point of holding her metoprolol and diltiazem this am because she feels that one of these meds are causing the symptoms.  Pt has been taking meds as prescribed but bp/p are as follows; 12/21/14 182/100, 199/98, 166/101, 178/97, 179/80 with pulse range 69-72 12/22/14  145/84 p 68 Pt denies head cold symptoms,cp, states dizziness is worse an hour after meds.  Pt was told that Cecille Rubin gerhardt Np was not here. I will forward to Dr Irish Lack to consult. Pt was told I will call her around noon to update her, pt satisfied with plan.

## 2014-12-22 NOTE — Telephone Encounter (Signed)
Pt is continuing to hold both meds, feels less dizzy currently. Pt was told that she will hear more later today or tomorrow, pt satisfied with plan.

## 2014-12-22 NOTE — Telephone Encounter (Signed)
Dr Irish Lack will review note and address later.

## 2014-12-23 NOTE — Telephone Encounter (Signed)
She would benefit from negative inotropes instead of vasodilators for BP.  Would have her try verapamil (extended release) 120 mg daily to see if this helps with BP.  With the thickening of her heart, the high BP readings could cause her dizziness.  We need to try to get these down.

## 2014-12-24 ENCOUNTER — Telehealth: Payer: Self-pay | Admitting: *Deleted

## 2014-12-24 MED ORDER — VERAPAMIL HCL ER 120 MG PO TBCR: 120.0000 mg | EXTENDED_RELEASE_TABLET | Freq: Every day | ORAL | Status: DC

## 2014-12-24 NOTE — Telephone Encounter (Signed)
Patient called in to speak with nurse "urgently". Patient has been calling the last few days with sx/concerns regarding headache/dizziness that has been continuous since she started the diltiazem. She is concerned that the metoprolol and diltiazem is causing her problems. She wants to hold off taking them and it is unclear how often she has not taken them. Note from Dr. Irish Lack advises patient to begin Verapamil ER 120 mg daily. Patient will be holding the metoprolol and the diltiazem and starting Verapamil today. She will check her BP this afternoon and call back to report in. She will also monitor BP throughout the weekend to report back on Monday. Patient instructed that if she develops worsening sx, increased blood pressure, return of headache, numbness, tingling, blurred vision or other neurological sx to please seek emergent care through the ED or 911.  Also provided education related to verapamil (time of onset, take with food, no grapefruit juice, potential S.E.) and that patient should not drive when dizzy so someone else needs to go pick up her medication.  Patient verbalized understanding and agreement with treatment care plan.

## 2014-12-24 NOTE — Telephone Encounter (Signed)
Follow UP  Pt called to speak w/ Rn about condition- BP, headache. Phone call transferred to [place].

## 2014-12-24 NOTE — Telephone Encounter (Signed)
Additional phone encounter opened. See phone note dated for today. Will close this encounter.

## 2014-12-25 NOTE — Telephone Encounter (Signed)
OK to discontinue Dilt and toprol at this point

## 2014-12-28 NOTE — Telephone Encounter (Signed)
I called and spoke with the patient to make sure she knew she could discontinue diltiazem and toprol. She was aware of this. Follow up on her BP as well- she reports that yesterday was not a good day for her.  BP's (3/7) were 181/114, 169/104, 172/101, 170/101-  She had previously been dizzy on her dilt/ toprol- this has resolved, but she did feel weak. Today her BP is 150/91. She states she feels "good" today, but is still weak. Overall, better than yesterday.  She is taking verapamil 120 mg at bedtime.  I have advised her to continue to monitor her BP's, but try not to check more than twice daily.  I will forward to Dr. Irish Lack for review and any further recommendations.  I advised I will not call her back if there is nothing new to report from the doctor.  She is aware to call with worsening BP's/ symptoms. She is scheduled for follow up on 01/13/15 with Dr. Irish Lack.

## 2014-12-29 NOTE — Telephone Encounter (Signed)
If her BP is consistnetly > 150/90, would increase verapamil to 240 mg daily.

## 2014-12-29 NOTE — Telephone Encounter (Signed)
The patient is aware of Dr. Hassell Done recommendations. I advised her she may take verapamil 120 mg two tablets at the same time, or if she feels better splitting the dose, she can take a tablet in the AM and a tablet in the PM. She voices understanding.

## 2015-01-04 ENCOUNTER — Other Ambulatory Visit: Payer: Self-pay | Admitting: Interventional Cardiology

## 2015-01-13 ENCOUNTER — Ambulatory Visit
Admission: RE | Admit: 2015-01-13 | Discharge: 2015-01-13 | Disposition: A | Discharge status: Home / Self Care | Payer: Medicare Other | Source: Ambulatory Visit | Attending: Interventional Cardiology | Admitting: Interventional Cardiology

## 2015-01-13 ENCOUNTER — Ambulatory Visit (INDEPENDENT_AMBULATORY_CARE_PROVIDER_SITE_OTHER): Payer: Medicare Other | Admitting: Interventional Cardiology

## 2015-01-13 ENCOUNTER — Encounter: Payer: Self-pay | Admitting: Interventional Cardiology

## 2015-01-13 VITALS — BP 118/80 | HR 81 | Ht 59.0 in | Wt 136.0 lb

## 2015-01-13 DIAGNOSIS — R0602 Shortness of breath: Secondary | ICD-10-CM

## 2015-01-13 DIAGNOSIS — R05 Cough: Secondary | ICD-10-CM | POA: Diagnosis not present

## 2015-01-13 DIAGNOSIS — I421 Obstructive hypertrophic cardiomyopathy: Secondary | ICD-10-CM

## 2015-01-13 DIAGNOSIS — I10 Essential (primary) hypertension: Secondary | ICD-10-CM

## 2015-01-13 DIAGNOSIS — R079 Chest pain, unspecified: Secondary | ICD-10-CM

## 2015-01-13 LAB — COMPREHENSIVE METABOLIC PANEL
ALBUMIN: 3.9 g/dL (ref 3.5–5.2)
ALT: 11 U/L (ref 0–35)
AST: 14 U/L (ref 0–37)
Alkaline Phosphatase: 39 U/L (ref 39–117)
BUN: 17 mg/dL (ref 6–23)
CALCIUM: 10 mg/dL (ref 8.4–10.5)
CHLORIDE: 102 meq/L (ref 96–112)
CO2: 26 meq/L (ref 19–32)
Creatinine, Ser: 0.96 mg/dL (ref 0.40–1.20)
GFR: 59.2 mL/min — AB (ref >60.00)
Glucose, Bld: 88 mg/dL (ref 70–99)
POTASSIUM: 3.8 meq/L (ref 3.5–5.1)
Sodium: 137 mEq/L (ref 135–145)
Total Bilirubin: 0.4 mg/dL (ref 0.2–1.2)
Total Protein: 7 g/dL (ref 6.0–8.3)

## 2015-01-13 LAB — BRAIN NATRIURETIC PEPTIDE: PRO B NATRI PEPTIDE: 39 pg/mL (ref 0.0–100.0)

## 2015-01-13 NOTE — Patient Instructions (Addendum)
Your physician recommends that you continue on your current medications as directed. Please refer to the Current Medication list given to you today.  Your physician recommends that you return for lab work: TODAY (CMET/BNP)  A chest x-ray takes a picture of the organs and structures inside the chest, including the heart, lungs, and blood vessels. This test can show several things, including, whether the heart is enlarges; whether fluid is building up in the lungs; and whether pacemaker / defibrillator leads are still in place.  Your physician wants you to follow-up in: 6 months with Dr. Irish Lack. You will receive a reminder letter in the mail two months in advance. If you don't receive a letter, please call our office to schedule the follow-up appointment.

## 2015-01-13 NOTE — Progress Notes (Signed)
Cardiology Office Note   Date:  01/13/2015   ID:  Vickie Mckee, DOB Jul 11, 1931, MRN [id number]  PCP:  Dorian Heckle, MD  Cardiologist:  Jettie Booze., MD   No chief complaint on file. SHortness of breath    History of Present Illness: Vickie Mckee is a 81 y.o. female who presents for evalaution for HOCM.  She was changed from Amlodipine to Diltiazem.  She had SE with diltiazem and metoprolol (see telephone encounter from early March 2016).  She was changed to verapamil and her sx improved.   Her BP has been much improved.  Her HCTz was stopped several months ago.  Over the past month, she has had some SHOB.  She cannot get a deep breath.  She walks around the house for exercise.  SHe takes care of herself.  She cooks for herself but has not been driving due to the [facility].   No falling.  No recent chest xray or blood work.   No swelling, weight gain.  No problems at night.  No orthopnea or PND.  SHOB only happens in the daytime. She feels generally weak.    When she tries to take a deep breath, she will eventually cough once the breath gets deep enough. No sputum production.  Minimal discomfort in her chest with deep breathing and coughing.  Not related to walking.     Past Medical History  Diagnosis Date  . Low back pain   . GERD (gastroesophageal reflux disease)   . Depression   . Thyroid nodule   . Postmenopausal     with osteopenia, bisphosphonates 1/05-1/11, improved osteopenia 4/12-repeat planned late 2015  . Vitamin D deficiency     repleted on weekly vitamin D 50000 iu  . IBS (irritable bowel syndrome)   . Decreased sense of smell     and taste-negative CT scan-2011  . Lumbar radiculopathy     with negative MRI (1991)  . Impaired fasting glucose   . Heart murmur     heard first time 4/12  . Shingles     2015  . History of mammogram     2010 and she does not want to repeat them anymore  . Essential hypertension   . HOCM (hypertrophic obstructive  cardiomyopathy)     a. Dx by echo 05/2014.  Marland Kitchen Hypothyroidism     Past Surgical History  Procedure Laterality Date  . Cataract extraction      removal with IOL bilateral  . Breast biopsy      x2  . Thyroidectomy      subtotal  . Skin cancer removal    . Glaucoma surgery      laser  . Abdominal hysterectomy       Current Outpatient Prescriptions  Medication Sig Dispense Refill  . aspirin 81 MG tablet Take 81 mg by mouth daily.    . Calcium Carbonate-Vitamin D (RA CALCIUM PLUS VITAMIN D) 600-400 MG-UNIT per tablet Take 1 tablet by mouth 2 (two) times daily.    Marland Kitchen glucosamine-chondroitin 500-400 MG tablet Take 2 tablets by mouth daily.    Marland Kitchen levothyroxine (SYNTHROID, LEVOTHROID) 75 MCG tablet Take 75 mcg by mouth daily before breakfast.     . meclizine (ANTIVERT) 25 MG tablet Take 12.5 mg by mouth 3 (three) times daily as needed for dizziness.    . Multiple Vitamin (MULTIVITAMIN) tablet Take 1 tablet by mouth daily. 40 mg    . NEXIUM 40 MG capsule Take  40 mg by mouth daily at 12 noon.     Marland Kitchen perphenazine-amitriptyline (ETRAFON/TRIAVIL) 4-10 MG TABS Take 1 tablet by mouth daily. depression    . PREMARIN 0.3 MG tablet 1/2 TABLET BY MOUTH DAILY    . verapamil (CALAN-SR) 120 MG CR tablet Take 1 tablet (120 mg total) by mouth at bedtime. 90 tablet 1  . Vitamin D, Ergocalciferol, (DRISDOL) 50000 UNITS CAPS capsule Take 50,000 Units by mouth every 7 (seven) days.     No current facility-administered medications for this visit.    Allergies:   Codeine    Social History:  The patient  reports that she has never smoked. She has never used smokeless tobacco. She reports that she does not drink alcohol.   Family History:  The patient's *family history includes Breast cancer in her mother; CAD in her brother and sister; COPD in her brother; CVA in her father; Diabetes in her brother; Heart failure in her sister; Hypertension in her father; Kidney disease in her sister; Lung cancer in her  brother; Stroke in her father. There is no history of Heart attack.    ROS:  Please see the history of present illness.   Otherwise, review of systems are positive for shortness of breath.  She has had problems with pescription meds in the past.  Nonproductive cough.   All other systems are reviewed and negative.    PHYSICAL EXAM: VS:  BP 118/80 mmHg  Pulse 81  Ht $R'4\' 11"'rS$  (1.499 m)  Wt 136 lb (61.689 kg)  BMI 27.45 kg/m2  SpO2 99% , BMI Body mass index is 27.45 kg/(m^2). GEN: Well nourished, well developed, mildly SHOB HEENT: normal Neck: no JVD, carotid bruits, or masses Cardiac: RRR; no murmurs, rubs, or gallops,no edema  Respiratory:  clear to auscultation bilaterally, normal work of breathing GI: soft, nontender, nondistended, + BS MS: no deformity or atrophy Skin: warm and dry, no rash Neuro:  Strength and sensation are intact Psych: euthymic mood, anxious affect   EKG:  EKG is ordered today. The ekg ordered today demonstrates NSR, LVH, early repol pattern- no change   Recent Labs: 08/06/2014: BUN 15; Creatinine 1.1; Potassium 3.7; Sodium 134*; TSH 1.05    Lipid Panel No results found for: CHOL, TRIG, HDL, CHOLHDL, VLDL, LDLCALC, LDLDIRECT    Wt Readings from Last 3 Encounters:  01/13/15 136 lb (61.689 kg)  10/06/14 134 lb 1.9 oz (60.836 kg)  08/06/14 140 lb (63.504 kg)      Other studies Reviewed: Additional studies/ records that were reviewed today include: 2015 echo. Review of the above records demonstrates: Normal LV function, LVH   ASSESSMENT AND PLAN:  1.  SHOB: No evidence of fluid overload on exam. Will check chest x-ray and BNP along with electrolytes. She has had this shortness of breath now for a month. Question whether she could have some type of seasonal allergies. If her cardiac workup is negative, would recommend follow-up with her primary care physician. She does have an appointment in April. I reviewed her echocardiogram result. She does have  hypertrophic obstructive cardiomyopathy, but this would not cause her shortness of breath at rest.  ECG reviewed and showed no significant change.  2.  Hypertension: She is tolerating verapamil very well compared to diltiazem and metoprolol. We stopped the amlodipine because of her hypertrophic obstructive cardiomyopathy. Blood pressure is well controlled today. It has been controlled at home also.   Current medicines are reviewed at length with the patient today.  The  patient has concerns regarding medicines.  The following changes have been made:  no change  Labs/ tests ordered today include: CMET, BNP, CXR  Orders Placed This Encounter  Procedures  . DG Chest 2 View  . B Nat Peptide  . Comp Met (CMET)  . EKG 12-Lead     Disposition:   FU with me in 6 months   Teresita Madura., MD  01/13/2015 10:25 AM    [facility] [place], [place], [place]  [address] Phone: [phone]; Fax: [phone]

## 2015-01-17 ENCOUNTER — Telehealth: Payer: Self-pay | Admitting: Interventional Cardiology

## 2015-01-17 DIAGNOSIS — R0602 Shortness of breath: Secondary | ICD-10-CM | POA: Diagnosis not present

## 2015-01-17 DIAGNOSIS — T7840XA Allergy, unspecified, initial encounter: Secondary | ICD-10-CM | POA: Diagnosis not present

## 2015-01-17 DIAGNOSIS — Z7982 Long term (current) use of aspirin: Secondary | ICD-10-CM | POA: Diagnosis not present

## 2015-01-17 DIAGNOSIS — T783XXA Angioneurotic edema, initial encounter: Secondary | ICD-10-CM | POA: Diagnosis not present

## 2015-01-17 DIAGNOSIS — Z79899 Other long term (current) drug therapy: Secondary | ICD-10-CM | POA: Diagnosis not present

## 2015-01-17 NOTE — Telephone Encounter (Signed)
Spoke with daughter who reports pt was seen in the ED at [facility] today for swelling of her tongue.  This has been treated and pt is being sent home on prednisone.  The daughter is concerned that the cause of this was increasing the Verapamil to BID.  She is asking if it is OK that she stop the verapamil be stopped to see if the swelling improves.  Advised OK to hold but they need to keep a check on her BP and report if it increases.  I will forward to Dr Irish Lack for his information.

## 2015-01-17 NOTE — Telephone Encounter (Signed)
Pt c/o medication issue:  1. Name of Medication: Verapamil  2. How are you currently taking this medication (dosage and times per day)? 2 tab 120mg   3. Are you having a reaction (difficulty breathing--STAT)? Yes, Sob  4. What is your medication issue? Swelling of her tongue

## 2015-01-18 MED ORDER — VERAPAMIL HCL ER 240 MG PO TBCR: 240.0000 mg | EXTENDED_RELEASE_TABLET | Freq: Every day | ORAL | Status: DC

## 2015-01-18 NOTE — Telephone Encounter (Signed)
Spoke with pt in regards to Metoprolol. Pt states that she took her Metoprolol yesterday morning and that shortly after taking, her tongue began to swell and filled up her whole mouth. Her daughter came and took her to ED. Pt states that they put her on Prednisone taper packet and Famotidine 20mg  QD. Pt states that she has been more tired the last 4 weeks but has gotten out occasionally but is worn out the following day. Pt states that chest discomfort has improved since last weeks visit. Pt states that she did not take Verapamil yesterday morning.  Informed pt that Dr. Irish Lack had changed her to Verapamil 240mg  daily at the beginning of March. Pt states that she was out of the medication and that is why she didn't take it. Informed pt that I would send in prescription, verified pharmacy.  Informed pt that I would send this information to Dr. Irish Lack for review and would call her back if Dr. Irish Lack made any changes or had new orders. Informed pt of lab and chest xray results and advised her to contact PCP office to get an appt to be seen due to xray being suggestive of bronchitis. Pt verbalized understanding and was in agreement with this plan.

## 2015-01-18 NOTE — Telephone Encounter (Signed)
Follow up     Pt c/o medication issue:  1. Name of Medication: metoprolol tartrate  2. How are you currently taking this medication (dosage and times per day)? 25mg  bid 3. Are you having a reaction (difficulty breathing--STAT)? no 4. What is your medication issue? Pt said she had to go to the ER yesterday because her tongue was swelling and she could not breathe.  Pt had just taken this medication prior to her tongue swelling.

## 2015-01-18 NOTE — Telephone Encounter (Signed)
Stay on verapamil 240 mg daily.  Stop metoprolol.

## 2015-01-18 NOTE — Telephone Encounter (Signed)
Informed pt to stay on Verapamil 240mg  daily and to stop metoprolol. Pt verbalized understanding and was in agreement with this plan.

## 2015-01-21 ENCOUNTER — Telehealth: Payer: Self-pay | Admitting: Interventional Cardiology

## 2015-01-21 NOTE — Telephone Encounter (Signed)
New Message  Pt called states that she took verapamil (CALAN-SR) 240 MG CR tablet during the day instead of at night. She requests a call back to determine if its ok//sr

## 2015-01-21 NOTE — Telephone Encounter (Signed)
Spoke with pt and she states that she took a Verapamil 240 CR last night about 10pm and then accidentally took another one at 1pm today and wanted to know if it was ok. Spoke with Dr. Angelena Form and he said pt would be fine. Called pt back and informed her that Dr. Angelena Form said she would be fine. Pt verbalized understanding and was very thankful for the call.

## 2015-01-24 ENCOUNTER — Emergency Department (HOSPITAL_COMMUNITY): Payer: Medicare Other

## 2015-01-24 ENCOUNTER — Emergency Department (HOSPITAL_COMMUNITY)
Admission: EM | Admit: 2015-01-24 | Discharge: 2015-01-25 | Disposition: A | Discharge status: Home / Self Care | Payer: Medicare Other | Attending: Emergency Medicine | Admitting: Emergency Medicine

## 2015-01-24 ENCOUNTER — Encounter (HOSPITAL_COMMUNITY): Payer: Self-pay | Admitting: *Deleted

## 2015-01-24 DIAGNOSIS — Z79899 Other long term (current) drug therapy: Secondary | ICD-10-CM | POA: Diagnosis not present

## 2015-01-24 DIAGNOSIS — K219 Gastro-esophageal reflux disease without esophagitis: Secondary | ICD-10-CM | POA: Insufficient documentation

## 2015-01-24 DIAGNOSIS — I10 Essential (primary) hypertension: Secondary | ICD-10-CM | POA: Diagnosis not present

## 2015-01-24 DIAGNOSIS — Z8619 Personal history of other infectious and parasitic diseases: Secondary | ICD-10-CM | POA: Diagnosis not present

## 2015-01-24 DIAGNOSIS — R001 Bradycardia, unspecified: Secondary | ICD-10-CM | POA: Insufficient documentation

## 2015-01-24 DIAGNOSIS — E039 Hypothyroidism, unspecified: Secondary | ICD-10-CM | POA: Insufficient documentation

## 2015-01-24 DIAGNOSIS — Z792 Long term (current) use of antibiotics: Secondary | ICD-10-CM | POA: Insufficient documentation

## 2015-01-24 DIAGNOSIS — D72829 Elevated white blood cell count, unspecified: Secondary | ICD-10-CM | POA: Diagnosis not present

## 2015-01-24 DIAGNOSIS — R0602 Shortness of breath: Secondary | ICD-10-CM | POA: Diagnosis present

## 2015-01-24 DIAGNOSIS — T783XXD Angioneurotic edema, subsequent encounter: Secondary | ICD-10-CM | POA: Diagnosis not present

## 2015-01-24 DIAGNOSIS — R05 Cough: Secondary | ICD-10-CM | POA: Diagnosis not present

## 2015-01-24 DIAGNOSIS — R791 Abnormal coagulation profile: Secondary | ICD-10-CM | POA: Diagnosis not present

## 2015-01-24 NOTE — ED Notes (Addendum)
Pt reporting SOB for past 2 weeks.  Family reports that she did see her PCP today.  Pt has had recent chest x-ray and showed she had chest x-ray.  States the her PCP called her today and said she had an elevated WBC and D-dimer (2.1).  Physician recommend follow-up to rule out a PE.   Pt reports being started on an inhaler and antibiotic today.

## 2015-01-24 NOTE — ED Provider Notes (Signed)
CSN: [id number]     Arrival date & time 01/24/15  2149 History  This chart was scribed for Veryl Speak, MD by Eustaquio Maize, ED Scribe. This patient was seen in room APA10/APA10 and the patient's care was started at 11:44 PM.    Chief Complaint  Patient presents with  . Abnormal Lab    The history is provided by the patient and a relative. No language interpreter was used.     HPI Comments: Vickie Mckee is a 81 y.o. female with hx hypertrophic obstructive cardiomyopathy who presents to the Emergency Department complaining of an abnormal lab value. Pt was seen at PCP's office earlier today for shortness of breath and generalized weakness for the past 2 weeks. Pt had CXR and blood work done. She was called later in the day by PCP informing her that she had elevated WBC and D-Dimer (2.1) and referred to come to ED for PE  Rule out. Pt reports that she began coughing and having mild chest pains today as well. Pt was started on antibiotics today. Denies fever, leg swelling, diarrhea, nausea, vomiting, or any other symptoms. Pt was seen in the last 2 weeks at Cardiologists office as well as in the ED at [facility] prior to being seen at PCP's. She had CXRs done both times. The cardiologist informed pt that she had Bronchitis but she was not put on antibiotics at that time.   Past Medical History  Diagnosis Date  . Low back pain   . GERD (gastroesophageal reflux disease)   . Depression   . Thyroid nodule   . Postmenopausal     with osteopenia, bisphosphonates 1/05-1/11, improved osteopenia 4/12-repeat planned late 2015  . Vitamin D deficiency     repleted on weekly vitamin D 50000 iu  . IBS (irritable bowel syndrome)   . Decreased sense of smell     and taste-negative CT scan-2011  . Lumbar radiculopathy     with negative MRI (1991)  . Impaired fasting glucose   . Heart murmur     heard first time 4/12  . Shingles     2015  . History of mammogram     2010 and she does not want to repeat  them anymore  . Essential hypertension   . HOCM (hypertrophic obstructive cardiomyopathy)     a. Dx by echo 05/2014.  Marland Kitchen Hypothyroidism    Past Surgical History  Procedure Laterality Date  . Cataract extraction      removal with IOL bilateral  . Breast biopsy      x2  . Thyroidectomy      subtotal  . Skin cancer removal    . Glaucoma surgery      laser  . Abdominal hysterectomy     Family History  Problem Relation Age of Onset  . Breast cancer Mother   . Hypertension Father   . CVA Father   . Heart failure Sister   . CAD Sister   . Kidney disease Sister   . COPD Brother   . Lung cancer Brother   . CAD Brother   . Diabetes Brother   . Stroke Father   . Heart attack Neg Hx    History  Substance Use Topics  . Smoking status: Never Smoker   . Smokeless tobacco: Never Used  . Alcohol Use: No   OB History    No data available     Review of Systems  A complete 10 system review of systems was  obtained and all systems are negative except as noted in the HPI and PMH.    Allergies  Codeine  Home Medications   Prior to Admission medications   Medication Sig Start Date End Date Taking? Authorizing Provider  azithromycin (ZITHROMAX) 250 MG tablet Take by mouth daily.   Yes Historical Provider, MD  aspirin 81 MG tablet Take 81 mg by mouth daily.    Historical Provider, MD  Calcium Carbonate-Vitamin D (RA CALCIUM PLUS VITAMIN D) 600-400 MG-UNIT per tablet Take 1 tablet by mouth 2 (two) times daily.    Historical Provider, MD  glucosamine-chondroitin 500-400 MG tablet Take 2 tablets by mouth daily.    Historical Provider, MD  levothyroxine (SYNTHROID, LEVOTHROID) 75 MCG tablet Take 75 mcg by mouth daily before breakfast.  07/14/14   Historical Provider, MD  meclizine (ANTIVERT) 25 MG tablet Take 12.5 mg by mouth 3 (three) times daily as needed for dizziness.    Historical Provider, MD  Multiple Vitamin (MULTIVITAMIN) tablet Take 1 tablet by mouth daily. 40 mg    Historical  Provider, MD  NEXIUM 40 MG capsule Take 40 mg by mouth daily at 12 noon.  06/16/14   Historical Provider, MD  perphenazine-amitriptyline (ETRAFON/TRIAVIL) 4-10 MG TABS Take 1 tablet by mouth daily. depression 06/16/14   Historical Provider, MD  PREMARIN 0.3 MG tablet 1/2 TABLET BY MOUTH DAILY 07/14/14   Historical Provider, MD  verapamil (CALAN-SR) 240 MG CR tablet Take 1 tablet (240 mg total) by mouth at bedtime. 01/18/15   Jettie Booze, MD  Vitamin D, Ergocalciferol, (DRISDOL) 50000 UNITS CAPS capsule Take 50,000 Units by mouth every 7 (seven) days.    Historical Provider, MD   Triage Vitals: BP 121/69 mmHg  Pulse 88  Temp(Src)   Resp 18  Ht 4\' 10"  (1.473 m)  Wt 137 lb (62.143 kg)  BMI 28.64 kg/m2  SpO2 95%   Physical Exam  Constitutional: She is oriented to person, place, and time. She appears well-developed and well-nourished. No distress.  HENT:  Head: Normocephalic and atraumatic.  Eyes: Conjunctivae and EOM are normal.  Neck: Neck supple. No tracheal deviation present.  Cardiovascular: Normal rate, regular rhythm and normal heart sounds.   Pulmonary/Chest: Effort normal and breath sounds normal. No respiratory distress.  Musculoskeletal: Normal range of motion. She exhibits no edema.  Neurological: She is alert and oriented to person, place, and time.  Skin: Skin is warm and dry.  Psychiatric: She has a normal mood and affect. Her behavior is normal.  Nursing note and vitals reviewed.   ED Course  Procedures (including critical care time)  DIAGNOSTIC STUDIES: Oxygen Saturation is 95% on RA, normal by my interpretation.    COORDINATION OF CARE: 11:52 PM-Discussed treatment plan which includes CT Chest with pt at bedside and pt agreed to plan.   Labs Review Labs Reviewed  COMPREHENSIVE METABOLIC PANEL - Abnormal; Notable for the following:    Potassium 3.4 (*)    Glucose, Bld 106 (*)    BUN 25 (*)    Creatinine, Ser 1.24 (*)    GFR calc non Af Amer 40 (*)     GFR calc Af Amer 46 (*)    All other components within normal limits  CBC WITH DIFFERENTIAL/PLATELET - Abnormal; Notable for the following:    WBC 14.8 (*)    Neutro Abs 9.7 (*)    Monocytes Absolute 1.4 (*)    All other components within normal limits  TROPONIN I  BRAIN NATRIURETIC PEPTIDE  Imaging Review Dg Chest 2 View  01/24/2015   CLINICAL DATA:  Increasing shortness of breath for 1 week. Can to ED for abnormal lab values.  EXAM: CHEST  2 VIEW  COMPARISON:  01/17/2015  FINDINGS: No cardiomegaly. There are stable aortic and hilar contours. Chronic interstitial coarsening without pneumonia or edema. No effusion or pneumothorax.  IMPRESSION: Stable exam.  No acute cardiopulmonary disease.   Electronically Signed   By: Monte Fantasia M.D.   On: 01/24/2015 22:43   Ct Angio Chest Pe W/cm &/or Wo Cm  01/25/2015   CLINICAL DATA:  Patient was seen at primary care physician office earlier today for shortness of breath and generalize weakness over the past 2 weeks. Patient found to have elevated white cell count and elevated D-dimer. Cough and mild chest pain today.  EXAM: CT ANGIOGRAPHY CHEST WITH CONTRAST  TECHNIQUE: Multidetector CT imaging of the chest was performed using the standard protocol during bolus administration of intravenous contrast. Multiplanar CT image reconstructions and MIPs were obtained to evaluate the vascular anatomy.  CONTRAST:  139mL OMNIPAQUE IOHEXOL 350 MG/ML SOLN  COMPARISON:  None.  FINDINGS: Technically adequate study with good opacification of the central and segmental pulmonary arteries. No focal filling defects. No evidence of significant pulmonary embolus.  Normal heart size. Normal caliber thoracic aorta. No aortic dissection. Great vessel origins are patent. Mild calcification in the aorta. Small esophageal hiatal hernia. Esophagus is decompressed. No significant lymphadenopathy in the chest.  Peripheral interstitial changes in the lungs likely representing fibrosis.  No focal airspace disease or consolidation. No pneumothorax. No pleural effusions. Airways appear patent.  Included portions of the upper abdominal organs are grossly unremarkable. Degenerative changes in the spine. No destructive bone lesions.  Review of the MIP images confirms the above findings.  IMPRESSION: No evidence of significant pulmonary embolus. Peripheral interstitial fibrosis in the lungs. No evidence of active pulmonary disease.   Electronically Signed   By: Lucienne Capers M.D.   On: 01/25/2015 00:48     EKG Interpretation None      MDM   Final diagnoses:  Shortness of breath    Patient is an 81 year old female sent from her primary doctor's office for evaluation of shortness of breath and weakness for the past 2 weeks. She had laboratory studies done there yesterday which revealed an elevated d-dimer. She was sent here for rule out of pulmonary embolism. Her workup today reveals no evidence for pulmonary embolism, congestive heart failure, or other acute cardiopulmonary abnormality. She is not anemic and electrolytes are essentially within normal limits. She is observed and appears to be appropriate for discharge. She was started on an antibiotic earlier today for presumed bronchitis. We have decided to give this antibiotic 48 hours to work. If it is not improving, she is to follow up with her primary Dr. and return to the ER if her symptoms significantly worsen.   I personally performed the services described in this documentation, which was scribed in my presence. The recorded information has been reviewed and is accurate.      Veryl Speak, MD 01/25/15 [phone]

## 2015-01-25 DIAGNOSIS — R0602 Shortness of breath: Secondary | ICD-10-CM | POA: Diagnosis not present

## 2015-01-25 LAB — CBC WITH DIFFERENTIAL/PLATELET
BASOS ABS: 0 10*3/uL (ref 0.0–0.1)
BASOS PCT: 0 % (ref 0–1)
EOS ABS: 0.3 10*3/uL (ref 0.0–0.7)
Eosinophils Relative: 2 % (ref 0–5)
HCT: 42.7 % (ref 36.0–46.0)
Hemoglobin: 14.9 g/dL (ref 12.0–15.0)
LYMPHS PCT: 23 % (ref 12–46)
Lymphs Abs: 3.4 10*3/uL (ref 0.7–4.0)
MCH: 31 pg (ref 26.0–34.0)
MCHC: 34.9 g/dL (ref 30.0–36.0)
MCV: 88.8 fL (ref 78.0–100.0)
Monocytes Absolute: 1.4 10*3/uL — ABNORMAL HIGH (ref 0.1–1.0)
Monocytes Relative: 10 % (ref 3–12)
NEUTROS ABS: 9.7 10*3/uL — AB (ref 1.7–7.7)
NEUTROS PCT: 65 % (ref 43–77)
PLATELETS: 312 10*3/uL (ref 150–400)
RBC: 4.81 MIL/uL (ref 3.87–5.11)
RDW: 13.3 % (ref 11.5–15.5)
WBC: 14.8 10*3/uL — AB (ref 4.0–10.5)

## 2015-01-25 LAB — COMPREHENSIVE METABOLIC PANEL
ALT: 17 U/L (ref 0–35)
ANION GAP: 8 (ref 5–15)
AST: 15 U/L (ref 0–37)
Albumin: 3.9 g/dL (ref 3.5–5.2)
Alkaline Phosphatase: 41 U/L (ref 39–117)
BUN: 25 mg/dL — AB (ref 6–23)
CALCIUM: 9.1 mg/dL (ref 8.4–10.5)
CO2: 25 mmol/L (ref 19–32)
CREATININE: 1.24 mg/dL — AB (ref 0.50–1.10)
Chloride: 104 mmol/L (ref 96–112)
GFR calc Af Amer: 46 mL/min — ABNORMAL LOW (ref >90)
GFR calc non Af Amer: 40 mL/min — ABNORMAL LOW (ref >90)
Glucose, Bld: 106 mg/dL — ABNORMAL HIGH (ref 70–99)
Potassium: 3.4 mmol/L — ABNORMAL LOW (ref 3.5–5.1)
Sodium: 137 mmol/L (ref 135–145)
TOTAL PROTEIN: 6.8 g/dL (ref 6.0–8.3)
Total Bilirubin: 0.6 mg/dL (ref 0.3–1.2)

## 2015-01-25 LAB — TROPONIN I

## 2015-01-25 LAB — BRAIN NATRIURETIC PEPTIDE: B Natriuretic Peptide: 66 pg/mL (ref 0.0–100.0)

## 2015-01-25 MED ORDER — IOHEXOL 350 MG/ML SOLN
100.0000 mL | Freq: Once | INTRAVENOUS | Status: AC | PRN
  Administered 2015-01-25: 100 mL via INTRAVENOUS

## 2015-01-25 NOTE — ED Notes (Signed)
Patient states that she is not in  Pain, states she is tired and sleepy.

## 2015-01-25 NOTE — Discharge Instructions (Signed)
Continue your antibiotics as prescribed.  Return to the emergency department if your symptoms significantly worsen or change.   Shortness of Breath Shortness of breath means you have trouble breathing. It could also mean that you have a medical problem. You should get immediate medical care for shortness of breath. CAUSES   Not enough oxygen in the air such as with high altitudes or a smoke-filled room.  Certain lung diseases, infections, or problems.  Heart disease or conditions, such as angina or heart failure.  Low red blood cells (anemia).  Poor physical fitness, which can cause shortness of breath when you exercise.  Chest or back injuries or stiffness.  Being overweight.  Smoking.  Anxiety, which can make you feel like you are not getting enough air. DIAGNOSIS  Serious medical problems can often be found during your physical exam. Tests may also be done to determine why you are having shortness of breath. Tests may include:  Chest X-rays.  Lung function tests.  Blood tests.  An electrocardiogram (ECG).  An ambulatory electrocardiogram. An ambulatory ECG records your heartbeat patterns over a 24-hour period.  Exercise testing.  A transthoracic echocardiogram (TTE). During echocardiography, sound waves are used to evaluate how blood flows through your heart.  A transesophageal echocardiogram (TEE).  Imaging scans. Your health care provider may not be able to find a cause for your shortness of breath after your exam. In this case, it is important to have a follow-up exam with your health care provider as directed.  TREATMENT  Treatment for shortness of breath depends on the cause of your symptoms and can vary greatly. HOME CARE INSTRUCTIONS   Do not smoke. Smoking is a common cause of shortness of breath. If you smoke, ask for help to quit.  Avoid being around chemicals or things that may bother your breathing, such as paint fumes and dust.  Rest as needed.  Slowly resume your usual activities.  If medicines were prescribed, take them as directed for the full length of time directed. This includes oxygen and any inhaled medicines.  Keep all follow-up appointments as directed by your health care provider. SEEK MEDICAL CARE IF:   Your condition does not improve in the time expected.  You have a hard time doing your normal activities even with rest.  You have any new symptoms. SEEK IMMEDIATE MEDICAL CARE IF:   Your shortness of breath gets worse.  You feel light-headed, faint, or develop a cough not controlled with medicines.  You start coughing up blood.  You have pain with breathing.  You have chest pain or pain in your arms, shoulders, or abdomen.  You have a fever.  You are unable to walk up stairs or exercise the way you normally do. MAKE SURE YOU:  Understand these instructions.  Will watch your condition.  Will get help right away if you are not doing well or get worse. Document Released: 07/03/2001 Document Revised: 10/13/2013 Document Reviewed: 12/24/2011 [facility] Patient Information 2015 [place], [place]. This information is not intended to replace advice given to you by your health care provider. Make sure you discuss any questions you have with your health care provider.

## 2015-02-01 DIAGNOSIS — I10 Essential (primary) hypertension: Secondary | ICD-10-CM | POA: Diagnosis not present

## 2015-02-01 DIAGNOSIS — I421 Obstructive hypertrophic cardiomyopathy: Secondary | ICD-10-CM | POA: Diagnosis not present

## 2015-02-01 DIAGNOSIS — J209 Acute bronchitis, unspecified: Secondary | ICD-10-CM | POA: Diagnosis not present

## 2015-04-01 DIAGNOSIS — I10 Essential (primary) hypertension: Secondary | ICD-10-CM | POA: Diagnosis not present

## 2015-04-01 DIAGNOSIS — Z1389 Encounter for screening for other disorder: Secondary | ICD-10-CM | POA: Diagnosis not present

## 2015-04-01 DIAGNOSIS — Z0000 Encounter for general adult medical examination without abnormal findings: Secondary | ICD-10-CM | POA: Diagnosis not present

## 2015-04-01 DIAGNOSIS — F3340 Major depressive disorder, recurrent, in remission, unspecified: Secondary | ICD-10-CM | POA: Diagnosis not present

## 2015-04-01 DIAGNOSIS — I421 Obstructive hypertrophic cardiomyopathy: Secondary | ICD-10-CM | POA: Diagnosis not present

## 2015-04-07 ENCOUNTER — Telehealth: Payer: Self-pay | Admitting: *Deleted

## 2015-04-07 NOTE — Telephone Encounter (Signed)
Patient called and would like a call back to discuss if ok to take the meds prescribed by her dentist yesterday along with her cardiac meds. Please advise. Thanks, MI

## 2015-04-07 NOTE — Telephone Encounter (Signed)
Spoke with pt and she states that she is having a tooth extracted soon and that her Dentist prescribed Clindamycin 300mg  and she wanted to make sure that it was ok to take this medication. Informed pt that was fine as that is on of the medications we typically prescribe as well for dental procedures. Pt verbalized understanding.

## 2015-04-18 ENCOUNTER — Telehealth: Payer: Self-pay | Admitting: Interventional Cardiology

## 2015-04-18 NOTE — Telephone Encounter (Signed)
Pt states she continues to be short of breath, it is not improved and unchanged  since office visit with Dr Irish Lack 01/13/15. Pt states she saw MD 01/24/15, had lab done and was sent to ED later that day. ED records from 01/24/15  indicate no evidence for pulmonary embolus, congestive heart failure or other cardiopulmonary abnormality. Pt states her chest hurts when she takes a deep breath and is constant.  Pt saw her PCP 2 weeks ago, discussed her symptoms with PCP, was not given any new recommendations.   I reviewed with Dr Albina Billet recommended a PFT if pt would like to pursue this, follow up with PCP.  Pt advised, verbalized understanding, declined to schedule PFT at this time, said she would call back if she changed her mind about having PFT done.

## 2015-04-18 NOTE — Telephone Encounter (Signed)
New Message   Pt c/o Shortness Of Breath: STAT if SOB developed within the last 24 hours or pt is noticeably SOB on the phone  1. Are you currently SOB (can you hear that pt is SOB on the phone)? Yes  2. How long have you been experiencing SOB? Weeks  3. Are you SOB when sitting or when up moving around? BOTH pt can not lay down to sleep   4. Are you currently experiencing any other symptoms? Pt sad she is having trouble walking

## 2015-05-31 ENCOUNTER — Telehealth: Payer: Self-pay | Admitting: Interventional Cardiology

## 2015-05-31 DIAGNOSIS — R0602 Shortness of breath: Secondary | ICD-10-CM

## 2015-05-31 NOTE — Telephone Encounter (Signed)
Spoke with pt and informed her that Dr. Irish Lack would like for her to have PFTs and see what those results show. Pt verbalized understanding and was in agreement with this plan.

## 2015-05-31 NOTE — Telephone Encounter (Signed)
Ok to check PFTs: lung volumes, spirometry DLCO.  -Shortness of breath Await results

## 2015-05-31 NOTE — Telephone Encounter (Signed)
Spoke with pt and she states that she has been feeling weaker over the last few weeks. Pt states some days she feels better than others but then she has days where she is just generally weak and can hardly get around. Pt states she does have SOB even when sitting, no SOB noted while on the phone. Pt checked vitals while on the phone and they were 121/81, HR 97. Pt states that she is eating and drinking well. Pt states she occasionally has a slight pain in the middle of her chest that does not radiate. Pt states she seen her PCP about a month ago for yearly physical and no changes were made. Informed pt that back in June, Dr. Irish Lack had wanted to check her PFTs and at that time she declined. Pt states that she is willing to have this done now if Dr. Irish Lack feels it is necessary. Pt states that daughter wanted pt to be seen this month if at all possible instead of waiting until appt with Dr. Irish Lack on 9/21. Will forward to Dr. Irish Lack for review and advisement.

## 2015-05-31 NOTE — Telephone Encounter (Signed)
New Message      Pt's daughter calling stating that pt is getting weaker and weaker everyday and wants to know if pt can be seen this month. There are no PA appt's available for this month. Please call back and advise.

## 2015-06-29 ENCOUNTER — Ambulatory Visit (INDEPENDENT_AMBULATORY_CARE_PROVIDER_SITE_OTHER): Payer: Medicare Other | Admitting: Internal Medicine

## 2015-06-29 DIAGNOSIS — R0602 Shortness of breath: Secondary | ICD-10-CM

## 2015-06-29 LAB — PULMONARY FUNCTION TEST
DL/VA % pred: 119 %
DL/VA: 4.88 ml/min/mmHg/L
DLCO UNC: 18.51 ml/min/mmHg
DLCO unc % pred: 105 %
FEF 25-75 Post: 4.42 L/sec
FEF 25-75 Pre: 4.05 L/sec
FEF2575-%Change-Post: 9 %
FEF2575-%Pred-Post: 428 %
FEF2575-%Pred-Pre: 393 %
FEV1-%CHANGE-POST: 0 %
FEV1-%PRED-POST: 169 %
FEV1-%Pred-Pre: 168 %
FEV1-POST: 2.38 L
FEV1-Pre: 2.38 L
FEV1FVC-%Change-Post: 2 %
FEV1FVC-%Pred-Pre: 120 %
FEV6-%CHANGE-POST: -2 %
FEV6-%PRED-POST: 145 %
FEV6-%PRED-PRE: 148 %
FEV6-POST: 2.63 L
FEV6-Pre: 2.7 L
FEV6FVC-%Change-Post: 0 %
FEV6FVC-%Pred-Post: 106 %
FEV6FVC-%Pred-Pre: 107 %
FVC-%Change-Post: -1 %
FVC-%PRED-POST: 136 %
FVC-%Pred-Pre: 139 %
FVC-Post: 2.65 L
FVC-Pre: 2.7 L
POST FEV6/FVC RATIO: 99 %
PRE FEV6/FVC RATIO: 100 %
Post FEV1/FVC ratio: 90 %
Pre FEV1/FVC ratio: 88 %
RV % PRED: 76 %
RV: 1.66 L
TLC % pred: 99 %
TLC: 4.27 L

## 2015-06-29 NOTE — Progress Notes (Signed)
PFT done today. 

## 2015-07-13 ENCOUNTER — Telehealth: Payer: Self-pay | Admitting: Interventional Cardiology

## 2015-07-13 ENCOUNTER — Ambulatory Visit: Payer: Medicare Other | Admitting: Interventional Cardiology

## 2015-07-13 NOTE — Telephone Encounter (Signed)
error 

## 2015-07-18 ENCOUNTER — Telehealth: Payer: Self-pay | Admitting: Interventional Cardiology

## 2015-07-18 MED ORDER — VERAPAMIL HCL ER 240 MG PO TBCR: 240.0000 mg | EXTENDED_RELEASE_TABLET | Freq: Every day | ORAL | Status: DC

## 2015-07-18 NOTE — Telephone Encounter (Signed)
Spoke with pt and she states that she called the pharmacy to get a refill and they told her that it had been cancelled. Advised pt that I would send in a new prescription because we have no stopped this medication. Pt verbalized understanding and was appreciative for call back.

## 2015-07-18 NOTE — Telephone Encounter (Signed)
New Message  Pt calling to speak w;/ RN concerning continuing verapamil. Pt was to take medication for f/u on 9/21 but had to resch for 10/18. Pt wanted to see if she needed to continue medication or not. Please call back and discuss,.

## 2015-08-03 DIAGNOSIS — Z23 Encounter for immunization: Secondary | ICD-10-CM | POA: Diagnosis not present

## 2015-08-09 ENCOUNTER — Ambulatory Visit (INDEPENDENT_AMBULATORY_CARE_PROVIDER_SITE_OTHER): Payer: Medicare Other | Admitting: Interventional Cardiology

## 2015-08-09 ENCOUNTER — Encounter: Payer: Self-pay | Admitting: Interventional Cardiology

## 2015-08-09 VITALS — BP 120/70 | HR 89 | Ht <= 58 in | Wt 131.0 lb

## 2015-08-09 DIAGNOSIS — R29898 Other symptoms and signs involving the musculoskeletal system: Secondary | ICD-10-CM

## 2015-08-09 DIAGNOSIS — I10 Essential (primary) hypertension: Secondary | ICD-10-CM | POA: Diagnosis not present

## 2015-08-09 DIAGNOSIS — I421 Obstructive hypertrophic cardiomyopathy: Secondary | ICD-10-CM | POA: Diagnosis not present

## 2015-08-09 NOTE — Patient Instructions (Signed)
Medication Instructions:  Same-no changes  Labwork: None  Testing/Procedures: None  Follow-Up: Your physician recommends that you schedule a follow-up appointment in: as needed       

## 2015-08-09 NOTE — Progress Notes (Signed)
Patient ID: Vickie Mckee, female   DOB: [birth year], 81 y.o.   MRN: [id number]     Cardiology Office Note   Date:  08/09/2015   ID:  Vickie Mckee, DOB 07/15/1931, MRN [id number]  PCP:  Kandice Hams, MD    No chief complaint on file.    Wt Readings from Last 3 Encounters:  08/09/15 131 lb (59.421 kg)  01/24/15 137 lb (62.143 kg)  01/13/15 136 lb (61.689 kg)       History of Present Illness: Vickie Mckee is a 80 y.o. female   who presents for evalaution for HOCM. She was changed from Amlodipine to Diltiazem. She had SE with diltiazem and metoprolol (see telephone encounter from early March 2016). She was changed to verapamil and her sx improved. Her BP has been much improved. Her HCTz was stopped in the past as well.  She reports leg weakness after an episode of bronchitis.  SHe still tries to walk.  Her breathing is better.  Leg weakness limits her the most.  SHe has nearly fallen.    She has lost 10 lbs intentionally.  No chest pain or recent shortness of breath.    Past Medical History  Diagnosis Date  . Low back pain   . GERD (gastroesophageal reflux disease)   . Depression   . Thyroid nodule   . Postmenopausal     with osteopenia, bisphosphonates 1/05-1/11, improved osteopenia 4/12-repeat planned late 2015  . Vitamin D deficiency     repleted on weekly vitamin D 50000 iu  . IBS (irritable bowel syndrome)   . Decreased sense of smell     and taste-negative CT scan-2011  . Lumbar radiculopathy     with negative MRI (1991)  . Impaired fasting glucose   . Heart murmur     heard first time 4/12  . Shingles     2015  . History of mammogram     2010 and she does not want to repeat them anymore  . Essential hypertension   . HOCM (hypertrophic obstructive cardiomyopathy) ([place])     a. Dx by echo 05/2014.  Marland Kitchen Hypothyroidism     Past Surgical History  Procedure Laterality Date  . Cataract extraction      removal with IOL bilateral  . Breast biopsy      x2  . Thyroidectomy      subtotal  . Skin cancer removal    . Glaucoma surgery      laser  . Abdominal hysterectomy       Current Outpatient Prescriptions  Medication Sig Dispense Refill  . aspirin 81 MG tablet Take 81 mg by mouth daily.    . Calcium Carbonate-Vitamin D (RA CALCIUM PLUS VITAMIN D) 600-400 MG-UNIT per tablet Take 1 tablet by mouth 2 (two) times daily.    . famotidine (PEPCID) 20 MG tablet Take 20 mg by mouth daily.     Marland Kitchen glucosamine-chondroitin 500-400 MG tablet Take 2 tablets by mouth daily.    . hydrochlorothiazide (HYDRODIURIL) 25 MG tablet Take 25 mg by mouth daily.     Marland Kitchen levothyroxine (SYNTHROID, LEVOTHROID) 75 MCG tablet Take 75 mcg by mouth daily before breakfast.     . meclizine (ANTIVERT) 25 MG tablet Take 12.5 mg by mouth 3 (three) times daily as needed for dizziness.    . Multiple Vitamin (MULTIVITAMIN) tablet Take 1 tablet by mouth daily. 40 mg    . NEXIUM 40 MG capsule Take 40 mg by mouth daily  at 12 noon.     Marland Kitchen perphenazine-amitriptyline (ETRAFON/TRIAVIL) 4-10 MG TABS Take 1 tablet by mouth daily. depression    . sertraline (ZOLOFT) 50 MG tablet Take 50 mg by mouth daily.     . verapamil (CALAN-SR) 240 MG CR tablet Take 1 tablet (240 mg total) by mouth at bedtime. 30 tablet 3  . Vitamin D, Ergocalciferol, (DRISDOL) 50000 UNITS CAPS capsule Take 50,000 Units by mouth every 7 (seven) days.     No current facility-administered medications for this visit.    Allergies:   Codeine    Social History:  The patient  reports that she has never smoked. She has never used smokeless tobacco. She reports that she does not drink alcohol.   Family History:  The patient's family history includes Breast cancer in her mother; CAD in her brother and sister; COPD in her brother; CVA in her father; Diabetes in her brother; Heart failure in her sister; Hypertension in her father; Kidney disease in her sister; Lung cancer in her brother; Stroke in her father. There is no  history of Heart attack.    ROS:  Please see the history of present illness.   Otherwise, review of systems are positive for cough improved; leg weakness.   All other systems are reviewed and negative.    PHYSICAL EXAM: VS:  BP 120/70 mmHg  Pulse 89  Ht 4\' 10"  (1.473 m)  Wt 131 lb (59.421 kg)  BMI 27.39 kg/m2  SpO2 93% , BMI Body mass index is 27.39 kg/(m^2). GEN: Well nourished, well developed, in no acute distress HEENT: normal Neck: no JVD, carotid bruits, or masses Cardiac: RRR; no murmurs, rubs, or gallops,no edema  Respiratory:  clear to auscultation bilaterally, normal work of breathing GI: soft, nontender, nondistended, + BS MS: no deformity or atrophy Skin: warm and dry, no rash Neuro:  Strength and sensation are intact, 4/5 leg strength bilaterally, symmetric Psych: euthymic mood, full affect     Recent Labs: 01/13/2015: Pro B Natriuretic peptide (BNP) 39.0 01/25/2015: ALT 17; B Natriuretic Peptide 66.0; BUN 25*; Creatinine, Ser 1.24*; Hemoglobin 14.9; Platelets 312; Potassium 3.4*; Sodium 137   Lipid Panel No results found for: CHOL, TRIG, HDL, CHOLHDL, VLDL, LDLCALC, LDLDIRECT   Other studies Reviewed: Additional studies/ records that were reviewed today with results demonstrating: Echo showing significant dynamic intracavitary LV gradient.   ASSESSMENT AND PLAN:  1. Hypertrophic cardiomyopathy: She has no exertional symptoms in terms of shortness of breath or chest discomfort. She is only limited by leg weakness. Would not plan any further invasive management of her hypertrophic artery mouth he. Continue verapamil. 2. Leg weakness: I asked her to follow-up with her primary care doctor. Therapy may help. 3. Hypertension: Well controlled. Continue current medicines. Unclear exactly what medicines she is on. Will have to confirm nonetheless. She may have restarted HCTZ. 4. She will follow-up with Dr. Delfina Redwood. She is comfortable following up As needed.   Current  medicines are reviewed at length with the patient today.  The patient concerns regarding her medicines were addressed.  The following changes have been made:  No change  Labs/ tests ordered today include:  No orders of the defined types were placed in this encounter.    Recommend 150 minutes/week of aerobic exercise Low fat, low carb, high fiber diet recommended  Disposition:   FU in prn   Teresita Madura., MD  08/09/2015 2:02 PM    [facility] [place],  [place]  [address] Phone: [phone]; Fax: [phone]

## 2016-04-30 DIAGNOSIS — Z0000 Encounter for general adult medical examination without abnormal findings: Secondary | ICD-10-CM | POA: Diagnosis not present

## 2016-04-30 DIAGNOSIS — I10 Essential (primary) hypertension: Secondary | ICD-10-CM | POA: Diagnosis not present

## 2016-04-30 DIAGNOSIS — R5383 Other fatigue: Secondary | ICD-10-CM | POA: Diagnosis not present

## 2016-04-30 DIAGNOSIS — Z1389 Encounter for screening for other disorder: Secondary | ICD-10-CM | POA: Diagnosis not present

## 2016-04-30 DIAGNOSIS — E039 Hypothyroidism, unspecified: Secondary | ICD-10-CM | POA: Diagnosis not present

## 2016-04-30 DIAGNOSIS — M810 Age-related osteoporosis without current pathological fracture: Secondary | ICD-10-CM | POA: Diagnosis not present

## 2016-04-30 DIAGNOSIS — I421 Obstructive hypertrophic cardiomyopathy: Secondary | ICD-10-CM | POA: Diagnosis not present

## 2016-04-30 DIAGNOSIS — F3340 Major depressive disorder, recurrent, in remission, unspecified: Secondary | ICD-10-CM | POA: Diagnosis not present

## 2016-04-30 DIAGNOSIS — R29898 Other symptoms and signs involving the musculoskeletal system: Secondary | ICD-10-CM | POA: Diagnosis not present

## 2017-02-06 DIAGNOSIS — C44629 Squamous cell carcinoma of skin of left upper limb, including shoulder: Secondary | ICD-10-CM | POA: Diagnosis not present

## 2017-02-06 DIAGNOSIS — B078 Other viral warts: Secondary | ICD-10-CM | POA: Diagnosis not present

## 2017-06-18 DIAGNOSIS — I10 Essential (primary) hypertension: Secondary | ICD-10-CM | POA: Diagnosis not present

## 2017-06-18 DIAGNOSIS — M810 Age-related osteoporosis without current pathological fracture: Secondary | ICD-10-CM | POA: Diagnosis not present

## 2017-06-18 DIAGNOSIS — E039 Hypothyroidism, unspecified: Secondary | ICD-10-CM | POA: Diagnosis not present

## 2017-06-18 DIAGNOSIS — E559 Vitamin D deficiency, unspecified: Secondary | ICD-10-CM | POA: Diagnosis not present

## 2017-06-18 DIAGNOSIS — K219 Gastro-esophageal reflux disease without esophagitis: Secondary | ICD-10-CM | POA: Diagnosis not present

## 2017-06-18 DIAGNOSIS — F3340 Major depressive disorder, recurrent, in remission, unspecified: Secondary | ICD-10-CM | POA: Diagnosis not present

## 2017-06-18 DIAGNOSIS — I421 Obstructive hypertrophic cardiomyopathy: Secondary | ICD-10-CM | POA: Diagnosis not present

## 2017-07-17 DIAGNOSIS — I10 Essential (primary) hypertension: Secondary | ICD-10-CM | POA: Diagnosis not present

## 2017-07-17 DIAGNOSIS — E041 Nontoxic single thyroid nodule: Secondary | ICD-10-CM | POA: Diagnosis not present

## 2017-07-17 DIAGNOSIS — M810 Age-related osteoporosis without current pathological fracture: Secondary | ICD-10-CM | POA: Diagnosis not present

## 2017-07-17 DIAGNOSIS — K219 Gastro-esophageal reflux disease without esophagitis: Secondary | ICD-10-CM | POA: Diagnosis not present

## 2017-07-17 DIAGNOSIS — I421 Obstructive hypertrophic cardiomyopathy: Secondary | ICD-10-CM | POA: Diagnosis not present

## 2017-07-17 DIAGNOSIS — Z23 Encounter for immunization: Secondary | ICD-10-CM | POA: Diagnosis not present

## 2017-07-17 DIAGNOSIS — Z0000 Encounter for general adult medical examination without abnormal findings: Secondary | ICD-10-CM | POA: Diagnosis not present

## 2017-07-17 DIAGNOSIS — Z1389 Encounter for screening for other disorder: Secondary | ICD-10-CM | POA: Diagnosis not present

## 2017-07-17 DIAGNOSIS — F322 Major depressive disorder, single episode, severe without psychotic features: Secondary | ICD-10-CM | POA: Diagnosis not present

## 2017-07-17 DIAGNOSIS — E039 Hypothyroidism, unspecified: Secondary | ICD-10-CM | POA: Diagnosis not present

## 2018-01-16 DIAGNOSIS — E559 Vitamin D deficiency, unspecified: Secondary | ICD-10-CM | POA: Diagnosis not present

## 2018-01-16 DIAGNOSIS — E039 Hypothyroidism, unspecified: Secondary | ICD-10-CM | POA: Diagnosis not present

## 2018-01-16 DIAGNOSIS — I10 Essential (primary) hypertension: Secondary | ICD-10-CM | POA: Diagnosis not present

## 2018-01-16 DIAGNOSIS — I421 Obstructive hypertrophic cardiomyopathy: Secondary | ICD-10-CM | POA: Diagnosis not present

## 2018-01-16 DIAGNOSIS — F322 Major depressive disorder, single episode, severe without psychotic features: Secondary | ICD-10-CM | POA: Diagnosis not present

## 2018-01-16 DIAGNOSIS — M810 Age-related osteoporosis without current pathological fracture: Secondary | ICD-10-CM | POA: Diagnosis not present

## 2018-09-26 DIAGNOSIS — M810 Age-related osteoporosis without current pathological fracture: Secondary | ICD-10-CM | POA: Diagnosis not present

## 2018-09-26 DIAGNOSIS — F3340 Major depressive disorder, recurrent, in remission, unspecified: Secondary | ICD-10-CM | POA: Diagnosis not present

## 2018-09-26 DIAGNOSIS — Z1389 Encounter for screening for other disorder: Secondary | ICD-10-CM | POA: Diagnosis not present

## 2018-09-26 DIAGNOSIS — R413 Other amnesia: Secondary | ICD-10-CM | POA: Diagnosis not present

## 2018-09-26 DIAGNOSIS — E039 Hypothyroidism, unspecified: Secondary | ICD-10-CM | POA: Diagnosis not present

## 2018-09-26 DIAGNOSIS — E781 Pure hyperglyceridemia: Secondary | ICD-10-CM | POA: Diagnosis not present

## 2018-09-26 DIAGNOSIS — Z0000 Encounter for general adult medical examination without abnormal findings: Secondary | ICD-10-CM | POA: Diagnosis not present

## 2018-09-26 DIAGNOSIS — I421 Obstructive hypertrophic cardiomyopathy: Secondary | ICD-10-CM | POA: Diagnosis not present

## 2018-12-02 DIAGNOSIS — R413 Other amnesia: Secondary | ICD-10-CM | POA: Diagnosis not present

## 2018-12-02 DIAGNOSIS — M810 Age-related osteoporosis without current pathological fracture: Secondary | ICD-10-CM | POA: Diagnosis not present

## 2018-12-02 DIAGNOSIS — F3340 Major depressive disorder, recurrent, in remission, unspecified: Secondary | ICD-10-CM | POA: Diagnosis not present

## 2018-12-03 ENCOUNTER — Encounter: Payer: Self-pay | Admitting: Neurology

## 2019-02-23 ENCOUNTER — Ambulatory Visit: Payer: Medicare Other | Admitting: Neurology

## 2019-03-27 ENCOUNTER — Encounter: Payer: Self-pay | Admitting: Neurology

## 2019-04-14 ENCOUNTER — Ambulatory Visit: Payer: Medicare Other | Admitting: Neurology

## 2019-06-22 DIAGNOSIS — I10 Essential (primary) hypertension: Secondary | ICD-10-CM | POA: Diagnosis not present

## 2019-06-22 DIAGNOSIS — E039 Hypothyroidism, unspecified: Secondary | ICD-10-CM | POA: Diagnosis not present

## 2019-06-22 DIAGNOSIS — R413 Other amnesia: Secondary | ICD-10-CM | POA: Diagnosis not present

## 2019-06-22 DIAGNOSIS — F3340 Major depressive disorder, recurrent, in remission, unspecified: Secondary | ICD-10-CM | POA: Diagnosis not present

## 2019-06-22 DIAGNOSIS — M810 Age-related osteoporosis without current pathological fracture: Secondary | ICD-10-CM | POA: Diagnosis not present

## 2020-02-22 DIAGNOSIS — Z23 Encounter for immunization: Secondary | ICD-10-CM | POA: Diagnosis not present

## 2020-03-08 DIAGNOSIS — Z23 Encounter for immunization: Secondary | ICD-10-CM | POA: Diagnosis not present

## 2020-07-20 ENCOUNTER — Other Ambulatory Visit (HOSPITAL_COMMUNITY): Payer: Self-pay | Admitting: Internal Medicine

## 2020-07-20 DIAGNOSIS — I10 Essential (primary) hypertension: Secondary | ICD-10-CM | POA: Diagnosis not present

## 2020-07-20 DIAGNOSIS — R413 Other amnesia: Secondary | ICD-10-CM | POA: Diagnosis not present

## 2020-07-20 DIAGNOSIS — M810 Age-related osteoporosis without current pathological fracture: Secondary | ICD-10-CM | POA: Diagnosis not present

## 2020-07-20 DIAGNOSIS — F3340 Major depressive disorder, recurrent, in remission, unspecified: Secondary | ICD-10-CM | POA: Diagnosis not present

## 2020-07-20 DIAGNOSIS — Z0000 Encounter for general adult medical examination without abnormal findings: Secondary | ICD-10-CM | POA: Diagnosis not present

## 2020-07-20 DIAGNOSIS — Z1389 Encounter for screening for other disorder: Secondary | ICD-10-CM | POA: Diagnosis not present

## 2020-07-20 DIAGNOSIS — E039 Hypothyroidism, unspecified: Secondary | ICD-10-CM | POA: Diagnosis not present

## 2020-07-20 DIAGNOSIS — Z23 Encounter for immunization: Secondary | ICD-10-CM | POA: Diagnosis not present

## 2020-08-16 ENCOUNTER — Other Ambulatory Visit (HOSPITAL_COMMUNITY): Payer: Medicare Other

## 2020-08-22 ENCOUNTER — Other Ambulatory Visit (HOSPITAL_COMMUNITY): Payer: Medicare Other

## 2020-08-25 DIAGNOSIS — R946 Abnormal results of thyroid function studies: Secondary | ICD-10-CM | POA: Diagnosis not present

## 2020-11-03 ENCOUNTER — Other Ambulatory Visit: Payer: Self-pay | Admitting: Internal Medicine

## 2020-11-03 ENCOUNTER — Ambulatory Visit
Admission: RE | Admit: 2020-11-03 | Discharge: 2020-11-03 | Disposition: A | Discharge status: Home / Self Care | Payer: Medicare Other | Source: Ambulatory Visit | Attending: Internal Medicine | Admitting: Internal Medicine

## 2020-11-03 DIAGNOSIS — I10 Essential (primary) hypertension: Secondary | ICD-10-CM | POA: Diagnosis not present

## 2020-11-03 DIAGNOSIS — S0990XA Unspecified injury of head, initial encounter: Secondary | ICD-10-CM | POA: Diagnosis not present

## 2020-11-03 DIAGNOSIS — S0990XS Unspecified injury of head, sequela: Secondary | ICD-10-CM

## 2020-11-03 DIAGNOSIS — R42 Dizziness and giddiness: Secondary | ICD-10-CM | POA: Diagnosis not present

## 2020-11-03 DIAGNOSIS — W19XXXS Unspecified fall, sequela: Secondary | ICD-10-CM | POA: Diagnosis not present

## 2020-11-03 DIAGNOSIS — R040 Epistaxis: Secondary | ICD-10-CM | POA: Diagnosis not present

## 2020-11-03 DIAGNOSIS — G44209 Tension-type headache, unspecified, not intractable: Secondary | ICD-10-CM | POA: Diagnosis not present

## 2021-02-06 DIAGNOSIS — I10 Essential (primary) hypertension: Secondary | ICD-10-CM | POA: Diagnosis not present

## 2021-02-06 DIAGNOSIS — R040 Epistaxis: Secondary | ICD-10-CM | POA: Diagnosis not present

## 2021-02-06 DIAGNOSIS — F3340 Major depressive disorder, recurrent, in remission, unspecified: Secondary | ICD-10-CM | POA: Diagnosis not present

## 2021-02-06 DIAGNOSIS — I421 Obstructive hypertrophic cardiomyopathy: Secondary | ICD-10-CM | POA: Diagnosis not present

## 2021-02-06 DIAGNOSIS — M810 Age-related osteoporosis without current pathological fracture: Secondary | ICD-10-CM | POA: Diagnosis not present

## 2021-02-06 DIAGNOSIS — E039 Hypothyroidism, unspecified: Secondary | ICD-10-CM | POA: Diagnosis not present

## 2021-05-29 ENCOUNTER — Inpatient Hospital Stay (HOSPITAL_COMMUNITY)
Admission: EM | Admit: 2021-05-29 | Discharge: 2021-06-01 | DRG: 065 | Disposition: A | Discharge status: Rehabilitation Facility | Payer: Medicare Other | Attending: Family Medicine | Admitting: Family Medicine

## 2021-05-29 ENCOUNTER — Inpatient Hospital Stay (HOSPITAL_COMMUNITY): Payer: Medicare Other

## 2021-05-29 ENCOUNTER — Emergency Department (HOSPITAL_COMMUNITY): Payer: Medicare Other

## 2021-05-29 ENCOUNTER — Other Ambulatory Visit (HOSPITAL_COMMUNITY): Payer: Medicare Other

## 2021-05-29 ENCOUNTER — Other Ambulatory Visit: Payer: Self-pay

## 2021-05-29 ENCOUNTER — Encounter (HOSPITAL_COMMUNITY): Payer: Self-pay | Admitting: *Deleted

## 2021-05-29 DIAGNOSIS — F0390 Unspecified dementia without behavioral disturbance: Secondary | ICD-10-CM | POA: Diagnosis present

## 2021-05-29 DIAGNOSIS — I10 Essential (primary) hypertension: Secondary | ICD-10-CM | POA: Diagnosis present

## 2021-05-29 DIAGNOSIS — Z7982 Long term (current) use of aspirin: Secondary | ICD-10-CM | POA: Diagnosis not present

## 2021-05-29 DIAGNOSIS — I6329 Cerebral infarction due to unspecified occlusion or stenosis of other precerebral arteries: Secondary | ICD-10-CM | POA: Diagnosis not present

## 2021-05-29 DIAGNOSIS — Z743 Need for continuous supervision: Secondary | ICD-10-CM | POA: Diagnosis not present

## 2021-05-29 DIAGNOSIS — I639 Cerebral infarction, unspecified: Secondary | ICD-10-CM

## 2021-05-29 DIAGNOSIS — G8194 Hemiplegia, unspecified affecting left nondominant side: Secondary | ICD-10-CM | POA: Diagnosis present

## 2021-05-29 DIAGNOSIS — W1830XA Fall on same level, unspecified, initial encounter: Secondary | ICD-10-CM | POA: Diagnosis present

## 2021-05-29 DIAGNOSIS — G319 Degenerative disease of nervous system, unspecified: Secondary | ICD-10-CM | POA: Diagnosis not present

## 2021-05-29 DIAGNOSIS — I672 Cerebral atherosclerosis: Secondary | ICD-10-CM | POA: Diagnosis present

## 2021-05-29 DIAGNOSIS — I499 Cardiac arrhythmia, unspecified: Secondary | ICD-10-CM | POA: Diagnosis not present

## 2021-05-29 DIAGNOSIS — M8580 Other specified disorders of bone density and structure, unspecified site: Secondary | ICD-10-CM | POA: Diagnosis present

## 2021-05-29 DIAGNOSIS — I6502 Occlusion and stenosis of left vertebral artery: Secondary | ICD-10-CM | POA: Diagnosis not present

## 2021-05-29 DIAGNOSIS — F419 Anxiety disorder, unspecified: Secondary | ICD-10-CM | POA: Diagnosis present

## 2021-05-29 DIAGNOSIS — Z823 Family history of stroke: Secondary | ICD-10-CM | POA: Diagnosis not present

## 2021-05-29 DIAGNOSIS — I6389 Other cerebral infarction: Secondary | ICD-10-CM

## 2021-05-29 DIAGNOSIS — R1312 Dysphagia, oropharyngeal phase: Secondary | ICD-10-CM | POA: Diagnosis present

## 2021-05-29 DIAGNOSIS — Z79899 Other long term (current) drug therapy: Secondary | ICD-10-CM

## 2021-05-29 DIAGNOSIS — E039 Hypothyroidism, unspecified: Secondary | ICD-10-CM

## 2021-05-29 DIAGNOSIS — E890 Postprocedural hypothyroidism: Secondary | ICD-10-CM | POA: Diagnosis present

## 2021-05-29 DIAGNOSIS — D72829 Elevated white blood cell count, unspecified: Secondary | ICD-10-CM | POA: Diagnosis present

## 2021-05-29 DIAGNOSIS — I6350 Cerebral infarction due to unspecified occlusion or stenosis of unspecified cerebral artery: Secondary | ICD-10-CM | POA: Diagnosis not present

## 2021-05-29 DIAGNOSIS — G9340 Encephalopathy, unspecified: Secondary | ICD-10-CM | POA: Diagnosis not present

## 2021-05-29 DIAGNOSIS — G8929 Other chronic pain: Secondary | ICD-10-CM | POA: Diagnosis present

## 2021-05-29 DIAGNOSIS — Z7902 Long term (current) use of antithrombotics/antiplatelets: Secondary | ICD-10-CM | POA: Diagnosis not present

## 2021-05-29 DIAGNOSIS — F32A Depression, unspecified: Secondary | ICD-10-CM | POA: Diagnosis present

## 2021-05-29 DIAGNOSIS — Z8249 Family history of ischemic heart disease and other diseases of the circulatory system: Secondary | ICD-10-CM

## 2021-05-29 DIAGNOSIS — Z801 Family history of malignant neoplasm of trachea, bronchus and lung: Secondary | ICD-10-CM

## 2021-05-29 DIAGNOSIS — R29898 Other symptoms and signs involving the musculoskeletal system: Secondary | ICD-10-CM | POA: Diagnosis not present

## 2021-05-29 DIAGNOSIS — Z85828 Personal history of other malignant neoplasm of skin: Secondary | ICD-10-CM

## 2021-05-29 DIAGNOSIS — K5900 Constipation, unspecified: Secondary | ICD-10-CM | POA: Diagnosis present

## 2021-05-29 DIAGNOSIS — Z825 Family history of asthma and other chronic lower respiratory diseases: Secondary | ICD-10-CM

## 2021-05-29 DIAGNOSIS — E785 Hyperlipidemia, unspecified: Secondary | ICD-10-CM | POA: Diagnosis present

## 2021-05-29 DIAGNOSIS — K589 Irritable bowel syndrome without diarrhea: Secondary | ICD-10-CM | POA: Diagnosis present

## 2021-05-29 DIAGNOSIS — Z20822 Contact with and (suspected) exposure to covid-19: Secondary | ICD-10-CM | POA: Diagnosis present

## 2021-05-29 DIAGNOSIS — I69322 Dysarthria following cerebral infarction: Secondary | ICD-10-CM | POA: Diagnosis not present

## 2021-05-29 DIAGNOSIS — E559 Vitamin D deficiency, unspecified: Secondary | ICD-10-CM | POA: Diagnosis present

## 2021-05-29 DIAGNOSIS — I69354 Hemiplegia and hemiparesis following cerebral infarction affecting left non-dominant side: Secondary | ICD-10-CM | POA: Diagnosis present

## 2021-05-29 DIAGNOSIS — M5416 Radiculopathy, lumbar region: Secondary | ICD-10-CM | POA: Diagnosis present

## 2021-05-29 DIAGNOSIS — R29711 NIHSS score 11: Secondary | ICD-10-CM | POA: Diagnosis present

## 2021-05-29 DIAGNOSIS — R29818 Other symptoms and signs involving the nervous system: Secondary | ICD-10-CM | POA: Diagnosis not present

## 2021-05-29 DIAGNOSIS — Z803 Family history of malignant neoplasm of breast: Secondary | ICD-10-CM

## 2021-05-29 DIAGNOSIS — E876 Hypokalemia: Secondary | ICD-10-CM | POA: Diagnosis not present

## 2021-05-29 DIAGNOSIS — Z885 Allergy status to narcotic agent status: Secondary | ICD-10-CM

## 2021-05-29 DIAGNOSIS — Z90710 Acquired absence of both cervix and uterus: Secondary | ICD-10-CM

## 2021-05-29 DIAGNOSIS — I69391 Dysphagia following cerebral infarction: Secondary | ICD-10-CM | POA: Diagnosis not present

## 2021-05-29 DIAGNOSIS — R471 Dysarthria and anarthria: Secondary | ICD-10-CM | POA: Diagnosis present

## 2021-05-29 DIAGNOSIS — R404 Transient alteration of awareness: Secondary | ICD-10-CM | POA: Diagnosis not present

## 2021-05-29 DIAGNOSIS — Z79890 Hormone replacement therapy (postmenopausal): Secondary | ICD-10-CM

## 2021-05-29 DIAGNOSIS — I6523 Occlusion and stenosis of bilateral carotid arteries: Secondary | ICD-10-CM | POA: Diagnosis not present

## 2021-05-29 DIAGNOSIS — Z833 Family history of diabetes mellitus: Secondary | ICD-10-CM

## 2021-05-29 DIAGNOSIS — I69952 Hemiplegia and hemiparesis following unspecified cerebrovascular disease affecting left dominant side: Secondary | ICD-10-CM | POA: Diagnosis not present

## 2021-05-29 DIAGNOSIS — I421 Obstructive hypertrophic cardiomyopathy: Secondary | ICD-10-CM | POA: Diagnosis present

## 2021-05-29 DIAGNOSIS — K219 Gastro-esophageal reflux disease without esophagitis: Secondary | ICD-10-CM | POA: Diagnosis present

## 2021-05-29 DIAGNOSIS — I69319 Unspecified symptoms and signs involving cognitive functions following cerebral infarction: Secondary | ICD-10-CM | POA: Diagnosis not present

## 2021-05-29 DIAGNOSIS — E46 Unspecified protein-calorie malnutrition: Secondary | ICD-10-CM | POA: Diagnosis present

## 2021-05-29 DIAGNOSIS — R52 Pain, unspecified: Secondary | ICD-10-CM | POA: Diagnosis not present

## 2021-05-29 DIAGNOSIS — R29810 Facial weakness: Secondary | ICD-10-CM | POA: Diagnosis present

## 2021-05-29 DIAGNOSIS — Z841 Family history of disorders of kidney and ureter: Secondary | ICD-10-CM

## 2021-05-29 DIAGNOSIS — E8809 Other disorders of plasma-protein metabolism, not elsewhere classified: Secondary | ICD-10-CM | POA: Diagnosis not present

## 2021-05-29 DIAGNOSIS — G8190 Hemiplegia, unspecified affecting unspecified side: Secondary | ICD-10-CM | POA: Diagnosis not present

## 2021-05-29 LAB — DIFFERENTIAL
Band Neutrophils: 2 %
Basophils Absolute: 0 10*3/uL (ref 0.0–0.1)
Basophils Relative: 0 %
Eosinophils Absolute: 0.2 10*3/uL (ref 0.0–0.5)
Eosinophils Relative: 1 %
Lymphocytes Relative: 11 %
Lymphs Abs: 2.3 10*3/uL (ref 0.7–4.0)
Monocytes Absolute: 0 10*3/uL — ABNORMAL LOW (ref 0.1–1.0)
Monocytes Relative: 0 %
Neutro Abs: 18.7 10*3/uL — ABNORMAL HIGH (ref 1.7–7.7)
Neutrophils Relative %: 86 %

## 2021-05-29 LAB — COMPREHENSIVE METABOLIC PANEL WITH GFR
ALT: 20 U/L (ref 0–44)
AST: 47 U/L — ABNORMAL HIGH (ref 15–41)
Albumin: 4.2 g/dL (ref 3.5–5.0)
Alkaline Phosphatase: 37 U/L — ABNORMAL LOW (ref 38–126)
Anion gap: 13 (ref 5–15)
BUN: 18 mg/dL (ref 8–23)
CO2: 23 mmol/L (ref 22–32)
Calcium: 8.9 mg/dL (ref 8.9–10.3)
Chloride: 100 mmol/L (ref 98–111)
Creatinine, Ser: 0.9 mg/dL (ref 0.44–1.00)
GFR, Estimated: >60 mL/min
Glucose, Bld: 137 mg/dL — ABNORMAL HIGH (ref 70–99)
Potassium: 2.6 mmol/L — CL (ref 3.5–5.1)
Sodium: 136 mmol/L (ref 135–145)
Total Bilirubin: 1.2 mg/dL (ref 0.3–1.2)
Total Protein: 7.4 g/dL (ref 6.5–8.1)

## 2021-05-29 LAB — RESP PANEL BY RT-PCR (FLU A&B, COVID) ARPGX2
Influenza A by PCR: NEGATIVE
Influenza B by PCR: NEGATIVE
SARS Coronavirus 2 by RT PCR: NEGATIVE

## 2021-05-29 LAB — RAPID URINE DRUG SCREEN, HOSP PERFORMED
Amphetamines: NOT DETECTED
Barbiturates: NOT DETECTED
Benzodiazepines: NOT DETECTED
Cocaine: NOT DETECTED
Opiates: NOT DETECTED
Tetrahydrocannabinol: NOT DETECTED

## 2021-05-29 LAB — CBC
HCT: 45.5 % (ref 36.0–46.0)
Hemoglobin: 15.6 g/dL — ABNORMAL HIGH (ref 12.0–15.0)
MCH: 29.9 pg (ref 26.0–34.0)
MCHC: 34.3 g/dL (ref 30.0–36.0)
MCV: 87.2 fL (ref 80.0–100.0)
Platelets: 317 10*3/uL (ref 150–400)
RBC: 5.22 MIL/uL — ABNORMAL HIGH (ref 3.87–5.11)
RDW: 13.7 % (ref 11.5–15.5)
WBC: 21.3 10*3/uL — ABNORMAL HIGH (ref 4.0–10.5)
nRBC: 0 % (ref 0.0–0.2)

## 2021-05-29 LAB — I-STAT CHEM 8, ED
BUN: 18 mg/dL (ref 8–23)
Calcium, Ion: 1.15 mmol/L (ref 1.15–1.40)
Chloride: 100 mmol/L (ref 98–111)
Creatinine, Ser: 0.7 mg/dL (ref 0.44–1.00)
Glucose, Bld: 133 mg/dL — ABNORMAL HIGH (ref 70–99)
HCT: 46 % (ref 36.0–46.0)
Hemoglobin: 15.6 g/dL — ABNORMAL HIGH (ref 12.0–15.0)
Potassium: 2.6 mmol/L — CL (ref 3.5–5.1)
Sodium: 141 mmol/L (ref 135–145)
TCO2: 27 mmol/L (ref 22–32)

## 2021-05-29 LAB — URINALYSIS, ROUTINE W REFLEX MICROSCOPIC
Bacteria, UA: NONE SEEN
Bilirubin Urine: NEGATIVE
Glucose, UA: NEGATIVE mg/dL
Ketones, ur: 80 mg/dL — AB
Nitrite: NEGATIVE
Protein, ur: 30 mg/dL — AB
Specific Gravity, Urine: 1.043 — ABNORMAL HIGH (ref 1.005–1.030)
pH: 8 (ref 5.0–8.0)

## 2021-05-29 LAB — PROTIME-INR
INR: 1 (ref 0.8–1.2)
Prothrombin Time: 13.5 s (ref 11.4–15.2)

## 2021-05-29 LAB — VITAMIN D 25 HYDROXY (VIT D DEFICIENCY, FRACTURES): Vit D, 25-Hydroxy: 36.2 ng/mL (ref 30–100)

## 2021-05-29 LAB — TSH: TSH: 2.093 u[IU]/mL (ref 0.350–4.500)

## 2021-05-29 LAB — ETHANOL: Alcohol, Ethyl (B): <10 mg/dL (ref <10)

## 2021-05-29 LAB — APTT: aPTT: 25 s (ref 24–36)

## 2021-05-29 LAB — MAGNESIUM: Magnesium: 1.8 mg/dL (ref 1.7–2.4)

## 2021-05-29 LAB — VITAMIN B12: Vitamin B-12: 584 pg/mL (ref 180–914)

## 2021-05-29 LAB — CBG MONITORING, ED: Glucose-Capillary: 141 mg/dL — ABNORMAL HIGH (ref 70–99)

## 2021-05-29 MED ORDER — ASPIRIN 325 MG PO TABS
650.0000 mg | ORAL_TABLET | Freq: Once | ORAL | Status: DC
  Filled 2021-05-29: qty 2

## 2021-05-29 MED ORDER — LEVOTHYROXINE SODIUM 50 MCG PO TABS: 75.0000 ug | ORAL_TABLET | Freq: Every day | ORAL | Status: DC

## 2021-05-29 MED ORDER — LEVOTHYROXINE SODIUM 100 MCG/5ML IV SOLN: 37.5000 ug | Freq: Every day | INTRAVENOUS | Status: DC

## 2021-05-29 MED ORDER — PANTOPRAZOLE SODIUM 40 MG PO TBEC: 40.0000 mg | DELAYED_RELEASE_TABLET | Freq: Every day | ORAL | Status: DC

## 2021-05-29 MED ORDER — ACETAMINOPHEN 325 MG PO TABS
650.0000 mg | ORAL_TABLET | ORAL | Status: DC | PRN
  Administered 2021-05-31: 650 mg via ORAL
  Filled 2021-05-29: qty 2

## 2021-05-29 MED ORDER — POTASSIUM CHLORIDE CRYS ER 20 MEQ PO TBCR
40.0000 meq | EXTENDED_RELEASE_TABLET | Freq: Once | ORAL | Status: DC
  Filled 2021-05-29: qty 2

## 2021-05-29 MED ORDER — PANTOPRAZOLE SODIUM 40 MG IV SOLR
40.0000 mg | INTRAVENOUS | Status: DC
  Administered 2021-05-29: 40 mg via INTRAVENOUS
  Filled 2021-05-29 (×2): qty 40

## 2021-05-29 MED ORDER — POTASSIUM CHLORIDE 10 MEQ/100ML IV SOLN
10.0000 meq | INTRAVENOUS | Status: AC
  Administered 2021-05-29 (×2): 10 meq via INTRAVENOUS
  Filled 2021-05-29 (×2): qty 100

## 2021-05-29 MED ORDER — POTASSIUM CHLORIDE IN NACL 40-0.9 MEQ/L-% IV SOLN
INTRAVENOUS | Status: DC
  Filled 2021-05-29 (×2): qty 1000

## 2021-05-29 MED ORDER — ASPIRIN 325 MG PO TABS: 650.0000 mg | ORAL_TABLET | Freq: Once | ORAL | Status: DC

## 2021-05-29 MED ORDER — SERTRALINE HCL 50 MG PO TABS: 100.0000 mg | ORAL_TABLET | Freq: Every day | ORAL | Status: DC

## 2021-05-29 MED ORDER — ACETAMINOPHEN 160 MG/5ML PO SOLN: 650.0000 mg | ORAL | Status: DC | PRN

## 2021-05-29 MED ORDER — SENNOSIDES-DOCUSATE SODIUM 8.6-50 MG PO TABS: 1.0000 | ORAL_TABLET | Freq: Every evening | ORAL | Status: DC | PRN

## 2021-05-29 MED ORDER — CLOPIDOGREL BISULFATE 75 MG PO TABS
75.0000 mg | ORAL_TABLET | Freq: Every day | ORAL | Status: DC
  Administered 2021-05-30 – 2021-06-01 (×3): 75 mg via ORAL
  Filled 2021-05-29 (×3): qty 1

## 2021-05-29 MED ORDER — IOHEXOL 350 MG/ML SOLN
75.0000 mL | Freq: Once | INTRAVENOUS | Status: AC | PRN
  Administered 2021-05-29: 75 mL via INTRAVENOUS

## 2021-05-29 MED ORDER — POTASSIUM CHLORIDE 10 MEQ/100ML IV SOLN
10.0000 meq | Freq: Once | INTRAVENOUS | Status: AC
  Administered 2021-05-29: 10 meq via INTRAVENOUS
  Filled 2021-05-29: qty 100

## 2021-05-29 MED ORDER — CLOPIDOGREL BISULFATE 75 MG PO TABS: 75.0000 mg | ORAL_TABLET | Freq: Every day | ORAL | Status: DC

## 2021-05-29 MED ORDER — ASPIRIN 300 MG RE SUPP
300.0000 mg | Freq: Every day | RECTAL | Status: DC
  Administered 2021-05-29 – 2021-05-30 (×2): 300 mg via RECTAL
  Filled 2021-05-29 (×2): qty 1

## 2021-05-29 MED ORDER — ASPIRIN EC 81 MG PO TBEC: 81.0000 mg | DELAYED_RELEASE_TABLET | Freq: Every day | ORAL | Status: DC

## 2021-05-29 MED ORDER — ENOXAPARIN SODIUM 40 MG/0.4ML IJ SOSY
40.0000 mg | PREFILLED_SYRINGE | INTRAMUSCULAR | Status: DC
  Administered 2021-05-29 – 2021-05-31 (×3): 40 mg via SUBCUTANEOUS
  Filled 2021-05-29 (×3): qty 0.4

## 2021-05-29 MED ORDER — SODIUM CHLORIDE 0.9 % IV SOLN: INTRAVENOUS | Status: DC

## 2021-05-29 MED ORDER — STROKE: EARLY STAGES OF RECOVERY BOOK
Freq: Once | Status: AC
  Filled 2021-05-29: qty 1

## 2021-05-29 MED ORDER — ACETAMINOPHEN 650 MG RE SUPP: 650.0000 mg | RECTAL | Status: DC | PRN

## 2021-05-29 MED ORDER — DONEPEZIL HCL 5 MG PO TABS: 5.0000 mg | ORAL_TABLET | Freq: Every day | ORAL | Status: DC

## 2021-05-29 NOTE — ED Notes (Signed)
Patient assisted on bedpan and states she needs bedside commode to have bowel movement. Patient educated on complete left sided weakness and safety issue with trying to assist to bedside commode. Daughter at bedside verbalized understanding. 2 person assist with changing patient's brief. Purewick resecured.

## 2021-05-29 NOTE — ED Notes (Signed)
Yellow Fall Risk Arm Band applied. Yellow grip socks applied. Instructed pt to not attempt to get up due to left sided weakness.

## 2021-05-29 NOTE — ED Notes (Signed)
Patient transported to CT 

## 2021-05-29 NOTE — H&P (Signed)
History and Physical    Vickie Mckee [id number] DOB: 05/28/1931 DOA: 05/29/2021  PCP: Seward Carol, MD   Patient coming from: home   I have personally briefly reviewed patient's old medical records in [place]  Chief Complaint: left sided weakness and dysarthria  HPI: Vickie Mckee is a 87 y.o. female with medical history significant of essential hypertension, hypothyroidism, gastroesophageal reflux disease, depression, vitamin D deficiency, history of HOCM and chronic low back pain; who presented to the hospital secondary to left-sided weakness and dysarthria.  Patient is on clear about exact Timing of initiation of her symptoms; she reported last felt normal after waking up this morning around 7 AM.  Patient reported ambulating as she normally does after waking up in the morning, and anywhere in between 8 and 9 she has no recollection of the events, family was not able to reach her and contacted EMS in the way to her house.  Patient was found on the floor demonstrating left-sided weakness, left lateralized gaze, vomiting content next to her and also dysarthria.  Patient denies chest pain, shortness of breath, abdominal pain, headaches, fever, chills, dysuria, hematuria, melena, hematochezia, sick contacts or any other complaints.  There is no history of urinary or fecal incontinence when patient was found.  Of note, there is no records of COVID vaccination; PCR in the ED was negative.  ED Course: Patient with strokelike symptoms demonstrating dysarthria, left-sided weakness with a muscle strength of 3 out of 5; positive hypokalemia, and negative CT head without contrast.  Case was discussed with neurology service who felt patient was out of window for tPA; was recommended for patient to be admitted to [facility] to complete stroke work-up.  Review of Systems: As per HPI otherwise all other systems reviewed and are negative.   Past Medical History:  Diagnosis Date    Decreased sense of smell    and taste-negative CT scan-2011   Depression    Essential hypertension    GERD (gastroesophageal reflux disease)    Heart murmur    heard first time 4/12   History of mammogram    2010 and she does not want to repeat them anymore   HOCM (hypertrophic obstructive cardiomyopathy) ([place])    a. Dx by echo 05/2014.   Hypothyroidism    IBS (irritable bowel syndrome)    Impaired fasting glucose    Low back pain    Lumbar radiculopathy    with negative MRI (1991)   Postmenopausal    with osteopenia, bisphosphonates 1/05-1/11, improved osteopenia 4/12-repeat planned late 2015   Shingles    2015   Thyroid nodule    Vitamin D deficiency    repleted on weekly vitamin D 50000 iu    Past Surgical History:  Procedure Laterality Date   ABDOMINAL HYSTERECTOMY     BREAST BIOPSY     x2   CATARACT EXTRACTION     removal with IOL bilateral   GLAUCOMA SURGERY     laser   skin cancer removal     THYROIDECTOMY     subtotal    Social History  reports that she has never smoked. She has never used smokeless tobacco. She reports that she does not drink alcohol and does not use drugs.  Allergies  Allergen Reactions   Codeine Nausea Only    Family History  Problem Relation Age of Onset   Breast cancer Mother    Hypertension Father    CVA Father  Heart failure Sister    CAD Sister    Kidney disease Sister    COPD Brother    Lung cancer Brother    CAD Brother    Diabetes Brother    Stroke Father    Heart attack Neg Hx     Prior to Admission medications   Medication Sig Start Date End Date Taking? Authorizing Provider  Cholecalciferol (VITAMIN D-3) 25 MCG (1000 UT) CAPS Take 1 capsule by mouth daily.   Yes [provider]  donepezil (ARICEPT) 5 MG tablet Take 5 mg by mouth at bedtime.   Yes [provider]  hydrochlorothiazide (HYDRODIURIL) 25 MG tablet Take 25 mg by mouth daily.  07/27/15  Yes [provider]  levothyroxine  (SYNTHROID, LEVOTHROID) 75 MCG tablet Take 75 mcg by mouth daily before breakfast.  07/14/14  Yes [provider]  pyridOXINE (VITAMIN B-6) 25 MG tablet Take 25 mg by mouth daily.   Yes [provider]  sertraline (ZOLOFT) 50 MG tablet Take 100 mg by mouth daily. 07/27/15  Yes [provider]  verapamil (CALAN-SR) 240 MG CR tablet Take 1 tablet (240 mg total) by mouth at bedtime. 07/18/15  Yes Jettie Booze, MD  vitamin C (ASCORBIC ACID) 500 MG tablet Take 500 mg by mouth daily.   Yes [provider]  zinc gluconate 50 MG tablet Take 50 mg by mouth daily.   Yes [provider]    Physical Exam: Vitals:   05/29/21 1358 05/29/21 1400 05/29/21 1430 05/29/21 1445  BP:  (!) 163/74 (!) 157/63 (!) 163/70  Pulse:  85 71 98  Resp:  (!) 25 (!) 28 (!) 24  Temp: 97.7 F (36.5 C)     TempSrc: Oral     SpO2:  95% 96% 96%  Weight: 60.4 kg     Height: '5\' 2"'$  (1.575 m)       Constitutional: No nystagmus, no chest pain, no nausea vomiting currently.  Some improvement in previously described dysarthria appreciated; continue to experience left-sided weakness (left upper extremity more than left lower extremity). Vitals:   05/29/21 1358 05/29/21 1400 05/29/21 1430 05/29/21 1445  BP:  (!) 163/74 (!) 157/63 (!) 163/70  Pulse:  85 71 98  Resp:  (!) 25 (!) 28 (!) 24  Temp: 97.7 F (36.5 C)     TempSrc: Oral     SpO2:  95% 96% 96%  Weight: 60.4 kg     Height: '5\' 2"'$  (1.575 m)      Eyes: PERRL, lids and conjunctivae normal; no icterus ENMT: Mucous membranes are moist. Posterior pharynx clear of any exudate or lesions. Neck: normal, supple, no masses, no thyromegaly, no JVD. Respiratory: clear to auscultation bilaterally, no wheezing, no crackles. Normal respiratory effort. No accessory muscle use.  Cardiovascular: Regular rate and rhythm, no rubs, no gallops, no JVD on exam. Abdomen: no tenderness, no masses palpated. No hepatosplenomegaly. Bowel sounds  positive.  Musculoskeletal: No cyanosis or clubbing; no joint deformity appreciated.  No edema Skin: No rashes or petechiae. Neurologic: CN 2-12 grossly intact.  Patient with left-sided 3 out of 5 muscle strength; positive dysarthria, expressed having mild difficulty finding words. Psychiatric: Normal judgment and insight. Alert and oriented x 3.  Stable mood.   Labs on Admission: I have personally reviewed following labs and imaging studies  CBC: Recent Labs  Lab 05/29/21 1327 05/29/21 1401  WBC 21.3*  --   NEUTROABS 18.7*  --   HGB 15.6* 15.6*  HCT  45.5 46.0  MCV 87.2  --   PLT 317  --     Basic Metabolic Panel: Recent Labs  Lab 05/29/21 1327 05/29/21 1328 05/29/21 1401  NA 136  --  141  K 2.6*  --  2.6*  CL 100  --  100  CO2 23  --   --   GLUCOSE 137*  --  133*  BUN 18  --  18  CREATININE 0.90  --  0.70  CALCIUM 8.9  --   --   MG  --  1.8  --     GFR: Estimated Creatinine Clearance: 42.4 mL/min (by C-G formula based on SCr of 0.7 mg/dL).  Liver Function Tests: Recent Labs  Lab 05/29/21 1327  AST 47*  ALT 20  ALKPHOS 37*  BILITOT 1.2  PROT 7.4  ALBUMIN 4.2    Urine analysis: No results found for: COLORURINE, APPEARANCEUR, LABSPEC, [place], GLUCOSEU, HGBUR, BILIRUBINUR, KETONESUR, PROTEINUR, UROBILINOGEN, NITRITE, LEUKOCYTESUR  Radiological Exams on Admission: CT ANGIO NECK W OR WO CONTRAST  Result Date: 05/29/2021 CLINICAL DATA:  Neuro deficit, acute, stroke suspected. Additional history provided: Left-sided weakness onset 1 hour prior to arrival. EXAM: CT ANGIOGRAPHY HEAD AND NECK TECHNIQUE: Multidetector CT imaging of the head and neck was performed using the standard protocol during bolus administration of intravenous contrast. Multiplanar CT image reconstructions and MIPs were obtained to evaluate the vascular anatomy. Carotid stenosis measurements (when applicable) are obtained utilizing NASCET criteria, using the distal internal carotid diameter as  the denominator. CONTRAST:  93m OMNIPAQUE IOHEXOL 350 MG/ML SOLN COMPARISON:  Noncontrast head CT performed earlier today 05/29/2021. FINDINGS: CTA NECK FINDINGS Aortic arch: Common origin of the innominate and left common carotid arteries. The left vertebral artery arises directly from the aortic arch. Atherosclerotic plaque within the visualized aortic arch and proximal major branch vessels of the neck. No hemodynamically significant innominate or proximal subclavian artery stenosis. Right carotid system: CCA and ICA patent within the neck without stenosis mild calcified plaque within the carotid bifurcation. Left carotid system: CCA and ICA patent within the neck without stenosis. Minimal calcified plaque within the carotid bifurcation. Nonspecific fusiform dilation of the distal cervical ICA to 7 mm. Vertebral arteries: Patent within the neck. The right vertebral artery is dominant. Mild to moderate stenosis within the distal V2 left vertebral artery. No other significant cervical vertebral artery stenosis is identified. Skeleton: Cervical spondylosis. C3-C4 and C4-C5 grade 1 anterolisthesis. No acute bony abnormality or aggressive osseous lesion. Other neck: No neck mass or cervical lymphadenopathy. Upper chest: No consolidation within the imaged lung apices. Review of the MIP images confirms the above findings CTA HEAD FINDINGS Anterior circulation: The intracranial internal carotid arteries are patent. Nonstenotic calcified plaque within both vessels. The M1 middle cerebral arteries are patent. No M2 proximal branch occlusion or high-grade proximal stenosis is identified. The anterior cerebral arteries are patent. The A1 left anterior cerebral artery is hypoplastic. 1 mm posteriorly projecting vascular protrusion arising from the region of the anterior communicating artery likely reflecting an aneurysm (series 6, image 99). 2 mm aneurysm arising from the distal M1 segment of the right middle cerebral artery  (series 9, image 23). At least one branch vessel appears to arise from this aneurysm Posterior circulation: The non dominant intracranial left vertebral artery is developmentally diminutive, but patent. The intracranial right vertebral artery is patent without significant stenosis. Developmentally diminutive basilar artery with superimposed mild atherosclerotic irregularity and stenoses. Hypoplastic P1 segments bilaterally with sizable bilateral posterior communicating  arteries. The posterior cerebral arteries are patent without high-grade proximal stenosis. Venous sinuses: Within the limitations of contrast timing, no convincing thrombus. Anatomic variants: As described Review of the MIP images confirms the above findings No intracranial large vessel occlusion identified. These results were called by telephone at the time of interpretation on 05/29/2021 at 2:25pm to provider ERIC [facility] , who verbally acknowledged these results. IMPRESSION: CTA neck: 1. The common carotid and internal carotid arteries are patent within the neck without stenosis. Mild atherosclerotic plaque within both carotid systems within the neck, as described. 2. Vertebral arteries patent within the neck. Mild-to-moderate stenosis within the distal V2 segment of the non-dominant left vertebral artery. 3. Mild nonspecific fusiform dilation of the distal cervical left ICA to 7 mm. CTA head: 1. No intracranial large vessel occlusion or proximal high-grade arterial stenosis identified. 2. Intracranial atherosclerotic disease, as described. 3. 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 At least one branch vessel arises from this aneurysm. 4. 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 Electronically Signed   By: Kellie Simmering DO   On: 05/29/2021 14:53   CT HEAD CODE STROKE WO CONTRAST  Result Date: 05/29/2021 CLINICAL DATA:  Code stroke. Neuro deficit, acute, stroke suspected. Additional  history provided: Left-sided weakness, last known well 1 hour prior to arrival. EXAM: CT HEAD WITHOUT CONTRAST TECHNIQUE: Contiguous axial images were obtained from the base of the skull through the vertex without intravenous contrast. COMPARISON:  Head CT 11/03/2020. FINDINGS: Brain: Mild generalized cerebral atrophy. There is no acute intracranial hemorrhage. No demarcated cortical infarct. No extra-axial fluid collection. No evidence of an intracranial mass. No midline shift. Vascular: No hyperdense vessel.  Atherosclerotic calcifications Skull: Normal. Negative for fracture or focal lesion. Sinuses/Orbits: Leftward gaze. Mild mucosal thickening within the left ethmoid air cells. Small volume frothy secretions within the right sphenoid sinus. ASPECTS ([place] Stroke Program Early CT Score) - Ganglionic level infarction (caudate, lentiform nuclei, internal capsule, insula, M1-M3 cortex): 7 - Supraganglionic infarction (M4-M6 cortex): 3 Total score (0-10 with 10 being normal): 10 These results were called by telephone at the time of interpretation on 05/29/2021 at 1:38 pm to provider [facility] , who verbally acknowledged these results. IMPRESSION: No evidence of acute intracranial abnormality. ASPECTS is 10. Mild generalized cerebral atrophy. Paranasal sinus disease at the imaged levels, as described. Electronically Signed   By: Kellie Simmering DO   On: 05/29/2021 13:40   CT ANGIO HEAD CODE STROKE  Result Date: 05/29/2021 CLINICAL DATA:  Neuro deficit, acute, stroke suspected. Additional history provided: Left-sided weakness onset 1 hour prior to arrival. EXAM: CT ANGIOGRAPHY HEAD AND NECK TECHNIQUE: Multidetector CT imaging of the head and neck was performed using the standard protocol during bolus administration of intravenous contrast. Multiplanar CT image reconstructions and MIPs were obtained to evaluate the vascular anatomy. Carotid stenosis measurements (when applicable) are obtained utilizing NASCET  criteria, using the distal internal carotid diameter as the denominator. CONTRAST:  22m OMNIPAQUE IOHEXOL 350 MG/ML SOLN COMPARISON:  Noncontrast head CT performed earlier today 05/29/2021. FINDINGS: CTA NECK FINDINGS Aortic arch: Common origin of the innominate and left common carotid arteries. The left vertebral artery arises directly from the aortic arch. Atherosclerotic plaque within the visualized aortic arch and proximal major branch vessels of the neck. No hemodynamically significant innominate or proximal subclavian artery stenosis. Right carotid system: CCA and ICA patent within the neck without stenosis mild calcified plaque within the carotid  bifurcation. Left carotid system: CCA and ICA patent within the neck without stenosis. Minimal calcified plaque within the carotid bifurcation. Nonspecific fusiform dilation of the distal cervical ICA to 7 mm. Vertebral arteries: Patent within the neck. The right vertebral artery is dominant. Mild to moderate stenosis within the distal V2 left vertebral artery. No other significant cervical vertebral artery stenosis is identified. Skeleton: Cervical spondylosis. C3-C4 and C4-C5 grade 1 anterolisthesis. No acute bony abnormality or aggressive osseous lesion. Other neck: No neck mass or cervical lymphadenopathy. Upper chest: No consolidation within the imaged lung apices. Review of the MIP images confirms the above findings CTA HEAD FINDINGS Anterior circulation: The intracranial internal carotid arteries are patent. Nonstenotic calcified plaque within both vessels. The M1 middle cerebral arteries are patent. No M2 proximal branch occlusion or high-grade proximal stenosis is identified. The anterior cerebral arteries are patent. The A1 left anterior cerebral artery is hypoplastic. 1 mm posteriorly projecting vascular protrusion arising from the region of the anterior communicating artery likely reflecting an aneurysm (series 6, image 99). 2 mm aneurysm arising from  the distal M1 segment of the right middle cerebral artery (series 9, image 23). At least one branch vessel appears to arise from this aneurysm Posterior circulation: The non dominant intracranial left vertebral artery is developmentally diminutive, but patent. The intracranial right vertebral artery is patent without significant stenosis. Developmentally diminutive basilar artery with superimposed mild atherosclerotic irregularity and stenoses. Hypoplastic P1 segments bilaterally with sizable bilateral posterior communicating arteries. The posterior cerebral arteries are patent without high-grade proximal stenosis. Venous sinuses: Within the limitations of contrast timing, no convincing thrombus. Anatomic variants: As described Review of the MIP images confirms the above findings No intracranial large vessel occlusion identified. These results were called by telephone at the time of interpretation on 05/29/2021 at 2:25pm to provider ERIC [facility] , who verbally acknowledged these results. IMPRESSION: CTA neck: 1. The common carotid and internal carotid arteries are patent within the neck without stenosis. Mild atherosclerotic plaque within both carotid systems within the neck, as described. 2. Vertebral arteries patent within the neck. Mild-to-moderate stenosis within the distal V2 segment of the non-dominant left vertebral artery. 3. Mild nonspecific fusiform dilation of the distal cervical left ICA to 7 mm. CTA head: 1. No intracranial large vessel occlusion or proximal high-grade arterial stenosis identified. 2. Intracranial atherosclerotic disease, as described. 3. 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 At least one branch vessel arises from this aneurysm. 4. 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 Electronically Signed   By: Kellie Simmering DO   On: 05/29/2021 14:53    EKG: Independently reviewed.  No acute ischemic changes.  Sinus  rhythm.  Assessment/Plan 1-strokelike symptoms: With concern for right-sided Ischemic stroke ([place]) -Patient has failed bedside swallowing evaluation and will kept n.p.o. -Speech therapy, physical therapy and Occupational Therapy has been requested. -Will check MRI, 2D echo, TSH, lipid panel, A1c, vitamin D and vitamin B12. -Given inability to take oral medications currently will use 300 mg of aspirin rectally. -Neurology service has been consulted and will follow further recommendations. -Of note, patient was actively taking a baby aspirin on daily basis prior to hospitalization. -will check EEG -So far CT head without contrast demonstrated no acute intracranial abnormality. -CT angiogram of the neck with common carotid and internal carotid arteries are patent within the neck without stenosis.  Mild atherosclerotic plaque within both carotid system within the neck appreciated.  Vertebral arteries  patent within the neck with a mild to moderate stenosis within the distal V2 segment of the nondominant left vertebral artery.  CT angio of the head without intracranial large pressure occlusion or proximal high-grade arterial stenosis identified.  2-hypokalemia -In the setting of diuretic use most likely -Will provide fluid resuscitation and electrolyte repletion -will check magnesium level  3-leukocytosis -No signs of acute infection appreciated -Most likely stress demargination -Will follow white blood cells count after fluid resuscitation.  4-hypertension -Allowing permissive hypertension in the setting of acute ischemic process -If Needed will use hydralazine.  5-hypothyroidism -Will check TSH as mentioned above -Continue Synthroid; provided through IV given inability to swallow pills currently.  6-gastroesophageal flux disease -Continue PPI  7-depression -Will resume home antidepressant regimen when able to tolerate p.o.  8-history of mild dementia -Plan is to resume the use of  donepezil when able to tolerate p.o.   DVT prophylaxis: Lovenox Code Status:   Full code Family Communication:  No family available during evaluation. Disposition Plan:   Patient is from:  Home  Anticipated DC to:  To be determined  Anticipated DC date:  Hopefully 2 days to reach medical stability  Anticipated DC barriers: Stroke work-up completion condition by neurology service.  Consults called:  Neurology service Admission status:  Inpatient, telemetry, length of stay more than 2 midnights.  Severity of Illness: The appropriate patient status for this patient is INPATIENT. Inpatient status is judged to be reasonable and necessary in order to provide the required intensity of service to ensure the patient's safety. The patient's presenting symptoms, physical exam findings, and initial radiographic and laboratory data in the context of their chronic comorbidities is felt to place them at high risk for further clinical deterioration. Furthermore, it is not anticipated that the patient will be medically stable for discharge from the hospital within 2 midnights of admission. The following factors support the patient status of inpatient.   " The patient's presenting symptoms include left-sided weakness and dysarthria. " The worrisome physical exam findings include ongoing left-sided weakness, dysarthria and difficulty finding words.. " The initial radiographic and laboratory data are worrisome because of CT scan of the head was negative for acute intracranial normalities; no physical examination suggesting acute right-sided ischemic stroke.. " The chronic co-morbidities include risk factors included hypertension and age; patient also with hypothyroidism and failure to receive protection while using baby aspirin on daily basis prior to admission..   * I certify that at the point of admission it is my clinical judgment that the patient will require inpatient hospital care spanning beyond 2  midnights from the point of admission due to high intensity of service, high risk for further deterioration and high frequency of surveillance required.Barton Dubois MD [facility]  How to contact the [facility] Attending or Consulting provider [place] or covering provider during after hours [place], for this patient?   Check the care team in [facility] and look for a) attending/consulting [facility] provider listed and b) the [facility] team listed Log into [email or web address] and use [place]'s universal password to access. If you do not have the password, please contact the hospital operator. Locate the [facility] provider you are looking for under [facility] and page to a number that you can be directly reached. If you still have difficulty reaching the provider, please page the [facility] (Director on Call) for the Hospitalists listed on amion for assistance.  05/29/2021, 5:14 PM

## 2021-05-29 NOTE — ED Notes (Signed)
Dr. Almyra Free notified for failing stroke swallow screen. Patient and family educated on NPO due to coughing after one sip of water. Dr. Almyra Free verbalized to withhold all medications and NPO.

## 2021-05-29 NOTE — ED Notes (Signed)
Awaiting treatment decision via Neurologist.

## 2021-05-29 NOTE — ED Provider Notes (Signed)
[facility] EMERGENCY DEPARTMENT Provider Note   CSN: [id number] Arrival date & time: 05/29/21  1316  An emergency department physician performed an initial assessment on this suspected stroke patient at 1320.  History Chief Complaint  Patient presents with   Code Stroke    Vickie Mckee is a 87 y.o. female.  Patient presented by ambulance as a stroke alert activation.  She is alone and think she woke up around 8 or 9 AM.  She states that she was able to ambulate across her home.  The next and she knew she was on the ground.  Family tried to call her to check on her, when they could not reach her ambulance was called to her home and they found her on the ground.  Patient appeared to have left-sided upper and lower extremity weakness and slurred speech.  Otherwise awake and alert and attempting to follow commands.      Past Medical History:  Diagnosis Date   Decreased sense of smell    and taste-negative CT scan-2011   Depression    Essential hypertension    GERD (gastroesophageal reflux disease)    Heart murmur    heard first time 4/12   History of mammogram    2010 and she does not want to repeat them anymore   HOCM (hypertrophic obstructive cardiomyopathy) ([place])    a. Dx by echo 05/2014.   Hypothyroidism    IBS (irritable bowel syndrome)    Impaired fasting glucose    Low back pain    Lumbar radiculopathy    with negative MRI (1991)   Postmenopausal    with osteopenia, bisphosphonates 1/05-1/11, improved osteopenia 4/12-repeat planned late 2015   Shingles    2015   Thyroid nodule    Vitamin D deficiency    repleted on weekly vitamin D 50000 iu    Patient Active Problem List   Diagnosis Date Noted   Essential hypertension    Hypothyroidism    HOCM (hypertrophic obstructive cardiomyopathy) ([place]) 07/23/2014   Weakness of both legs 07/23/2014   Heart murmur     Past Surgical History:  Procedure Laterality Date   ABDOMINAL HYSTERECTOMY     BREAST BIOPSY      x2   CATARACT EXTRACTION     removal with IOL bilateral   GLAUCOMA SURGERY     laser   skin cancer removal     THYROIDECTOMY     subtotal     OB History   No obstetric history on file.     Family History  Problem Relation Age of Onset   Breast cancer Mother    Hypertension Father    CVA Father    Heart failure Sister    CAD Sister    Kidney disease Sister    COPD Brother    Lung cancer Brother    CAD Brother    Diabetes Brother    Stroke Father    Heart attack Neg Hx     Social History   Tobacco Use   Smoking status: Never   Smokeless tobacco: Never  Vaping Use   Vaping Use: Never used  Substance Use Topics   Alcohol use: No    Alcohol/week: 0.0 standard drinks   Drug use: Never    Home Medications Prior to Admission medications   Medication Sig Start Date End Date Taking? Authorizing Provider  aspirin 81 MG tablet Take 81 mg by mouth daily.    [provider]  Calcium Carbonate-Vitamin  D (RA CALCIUM PLUS VITAMIN D) 600-400 MG-UNIT per tablet Take 1 tablet by mouth 2 (two) times daily.    [provider]  famotidine (PEPCID) 20 MG tablet Take 20 mg by mouth daily.  07/27/15   [provider]  glucosamine-chondroitin 500-400 MG tablet Take 2 tablets by mouth daily.    [provider]  hydrochlorothiazide (HYDRODIURIL) 25 MG tablet Take 25 mg by mouth daily.  07/27/15   [provider]  levothyroxine (SYNTHROID, LEVOTHROID) 75 MCG tablet Take 75 mcg by mouth daily before breakfast.  07/14/14   [provider]  meclizine (ANTIVERT) 25 MG tablet Take 12.5 mg by mouth 3 (three) times daily as needed for dizziness.    [provider]  Multiple Vitamin (MULTIVITAMIN) tablet Take 1 tablet by mouth daily. 40 mg    [provider]  NEXIUM 40 MG capsule Take 40 mg by mouth daily at 12 noon.  06/16/14   [provider]  perphenazine-amitriptyline (ETRAFON/TRIAVIL) 4-10 MG TABS Take 1 tablet by  mouth daily. depression 06/16/14   [provider]  sertraline (ZOLOFT) 50 MG tablet Take 50 mg by mouth daily.  07/27/15   [provider]  verapamil (CALAN-SR) 240 MG CR tablet Take 1 tablet (240 mg total) by mouth at bedtime. 07/18/15   Jettie Booze, MD  Vitamin D, Ergocalciferol, (DRISDOL) 50000 UNITS CAPS capsule Take 50,000 Units by mouth every 7 (seven) days.    [provider]    Allergies    Codeine  Review of Systems   Review of Systems  Constitutional:  Negative for fever.  HENT:  Negative for ear pain.   Eyes:  Negative for pain.  Respiratory:  Negative for cough.   Cardiovascular:  Negative for chest pain.  Gastrointestinal:  Negative for abdominal pain.  Genitourinary:  Negative for flank pain.  Musculoskeletal:  Negative for back pain.  Skin:  Negative for rash.  Neurological:  Negative for headaches.   Physical Exam Updated Vital Signs BP (!) 157/63   Pulse 71   Temp 97.7 F (36.5 C) (Oral)   Resp (!) 28   Ht '5\' 2"'$  (1.575 m)   Wt 60.4 kg   SpO2 96%   BMI 24.34 kg/m   Physical Exam Constitutional:      General: She is not in acute distress.    Appearance: Normal appearance.  HENT:     Head: Normocephalic.     Nose: Nose normal.  Eyes:     Extraocular Movements: Extraocular movements intact.  Cardiovascular:     Rate and Rhythm: Normal rate.  Pulmonary:     Effort: Pulmonary effort is normal.  Musculoskeletal:        General: Normal range of motion.     Cervical back: Normal range of motion.  Neurological:     Mental Status: She is alert.     Comments: Weakness to left upper and left lower extremity 3 out of 5.  Cranial nerves II through XII otherwise appear to be intact.  Patient has slurred speech, no obvious facial droop noted.    ED Results / Procedures / Treatments   Labs (all labs ordered are listed, but only abnormal results are displayed) Labs Reviewed  CBC - Abnormal; Notable for the following  components:      Result Value   WBC 21.3 (*)    RBC 5.22 (*)    Hemoglobin 15.6 (*)    All other components within normal limits  COMPREHENSIVE  METABOLIC PANEL - Abnormal; Notable for the following components:   Potassium 2.6 (*)    Glucose, Bld 137 (*)    AST 47 (*)    Alkaline Phosphatase 37 (*)    All other components within normal limits  I-STAT CHEM 8, ED - Abnormal; Notable for the following components:   Potassium 2.6 (*)    Glucose, Bld 133 (*)    Hemoglobin 15.6 (*)    All other components within normal limits  CBG MONITORING, ED - Abnormal; Notable for the following components:   Glucose-Capillary 141 (*)    All other components within normal limits  RESP PANEL BY RT-PCR (FLU A&B, COVID) ARPGX2  ETHANOL  PROTIME-INR  APTT  DIFFERENTIAL  RAPID URINE DRUG SCREEN, HOSP PERFORMED  URINALYSIS, ROUTINE W REFLEX MICROSCOPIC    EKG None  Radiology CT HEAD CODE STROKE WO CONTRAST  Result Date: 05/29/2021 CLINICAL DATA:  Code stroke. Neuro deficit, acute, stroke suspected. Additional history provided: Left-sided weakness, last known well 1 hour prior to arrival. EXAM: CT HEAD WITHOUT CONTRAST TECHNIQUE: Contiguous axial images were obtained from the base of the skull through the vertex without intravenous contrast. COMPARISON:  Head CT 11/03/2020. FINDINGS: Brain: Mild generalized cerebral atrophy. There is no acute intracranial hemorrhage. No demarcated cortical infarct. No extra-axial fluid collection. No evidence of an intracranial mass. No midline shift. Vascular: No hyperdense vessel.  Atherosclerotic calcifications Skull: Normal. Negative for fracture or focal lesion. Sinuses/Orbits: Leftward gaze. Mild mucosal thickening within the left ethmoid air cells. Small volume frothy secretions within the right sphenoid sinus. ASPECTS ([place] Stroke Program Early CT Score) - Ganglionic level infarction (caudate, lentiform nuclei, internal capsule, insula, M1-M3 cortex): 7 -  Supraganglionic infarction (M4-M6 cortex): 3 Total score (0-10 with 10 being normal): 10 These results were called by telephone at the time of interpretation on 05/29/2021 at 1:38 pm to provider [facility] , who verbally acknowledged these results. IMPRESSION: No evidence of acute intracranial abnormality. ASPECTS is 10. Mild generalized cerebral atrophy. Paranasal sinus disease at the imaged levels, as described. Electronically Signed   By: Kellie Simmering DO   On: 05/29/2021 13:40    Procedures .Critical Care  Date/Time: 05/29/2021 2:38 PM Performed by: Luna Fuse, MD Authorized by: Luna Fuse, MD   Critical care provider statement:    Critical care time (minutes):  45   Critical care was necessary to treat or prevent imminent or life-threatening deterioration of the following conditions:  CNS failure or compromise   Critical care was time spent personally by me on the following activities:  Discussions with consultants, evaluation of patient's response to treatment, examination of patient, ordering and performing treatments and interventions, ordering and review of laboratory studies, ordering and review of radiographic studies, pulse oximetry, re-evaluation of patient's condition, obtaining history from patient or surrogate and review of old charts   Medications Ordered in ED Medications  potassium chloride 10 mEq in 100 mL IVPB (has no administration in time range)  potassium chloride SA (KLOR-CON) CR tablet 40 mEq (has no administration in time range)  aspirin tablet 650 mg (has no administration in time range)  clopidogrel (PLAVIX) tablet 75 mg (has no administration in time range)  aspirin EC tablet 81 mg (has no administration in time range)  iohexol (OMNIPAQUE) 350 MG/ML injection 75 mL (75 mLs Intravenous Contrast Given 05/29/21 1416)    ED Course  I have reviewed the triage vital signs and the nursing notes.  Pertinent labs & imaging  results that were available during my care  of the patient were reviewed by me and considered in my medical decision making (see chart for details).    MDM Rules/Calculators/A&P                           Stroke alert was activated upon arrival to the ER.  CT imaging of the brain shows no acute findings.  No large vessel occlusion noted on CT angio.  Case reviewed by telemetry neurology, recommending admission here at [facility] and further MRI imaging and work-up.  Potassium found to be low and given repletion here.   Final Clinical Impression(s) / ED Diagnoses Final diagnoses:  Acute ischemic stroke ([place])  Hypokalemia    Rx / DC Orders ED Discharge Orders     None        Luna Fuse, MD 05/29/21 1438

## 2021-05-29 NOTE — ED Triage Notes (Signed)
Pt brought in by [facility] with c/o left sided facial droop, slurred speech, left sided weakness (arm and leg). EMS reports BP 180/80, CBG WNL. Pt reports she got up this morning and felt normal and walked. Pt unsure exactly what time she woke up, her best timeframe at this time is between 0800-900. Family found pt lying on the floor around 1100. Pt had vomited on the floor. Pt lives alone.

## 2021-05-29 NOTE — ED Notes (Signed)
Dr. Earnest Conroy into speak with patient and daughter per daughters request.

## 2021-05-29 NOTE — Progress Notes (Signed)
  Echocardiogram 2D Echocardiogram has been performed.  Vickie Mckee 05/29/2021, 4:22 PM

## 2021-05-29 NOTE — Consult Note (Signed)
[facility] TeleNeurology Consult Services    Date of Service:  05/29/2021    Impression: Acute ischemic stroke. Possible localizations include right thalamus, right internal capsule and right pons.     Metrics: Last Known Well: Between 7 AM and 9 AM per patient Symptoms: As per HPI.  Patient is not a candidate for thrombolytic.   Location of the provider: [facility]  Location of the patient: [facility] Pre-Morbid Modified Rankin Scale: 0  This consult was provided via telemedicine with 2-way video and audio communication. The patient/family was informed that care would be provided in this way and agreed to receive care in this manner.   ED Physician notified of diagnostic impression and management plan at: 2:34 PM.   Assessment: 87 year old female presenting with acute dysarthria, LUE > LLE weakness and mild left sided facial droop.   -Exam findings are most consistent with acute ischemic stroke. Possible localizations include right thalamus, right internal capsule and right pons.   -CT head shows no evidence of acute intracranial abnormality. ASPECTS is 10. Mild generalized cerebral atrophy is noted.   -CTA neck: The common carotid and internal carotid arteries are patent within the neck without stenosis. Mild atherosclerotic plaque within both carotid systems within the neck. Vertebral arteries patent within the neck. Mild-to-moderate stenosis within the distal V2 segment of the non-dominant left vertebral artery. Mild nonspecific fusiform dilation of the distal cervical left ICA to 7 mm. -CTA head: No intracranial large vessel occlusion or proximal high-grade arterial stenosis identified. Intracranial atherosclerotic disease. 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 At least one branch vessel arises from this aneurysm. 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 -Stroke  risk factors:  HTN and advanced age - Classifiable as having failed ASA monotherapy     Recommendations: - Stroke work up to include MRI brain, TTE and cardiac telemetry. - HgbA1c, fasting lipid panel - PT consult, OT consult, Speech consult - ASA 650 mg crushed x 1 now. Add Plavix 75 mg po qd to her daily ASA regimen of 81 mg po qd - Given her advanced age, risks of statin therapy may outweigh potential benefits.  - Modified permissive HTN protocol given advanced age. For the next 24 hours, treat if SBP > 180. After 24 hours, gradually normalize SBP to a goal of 120-140.  - Risk factor modification - Frequent neuro checks - NPO until passes stroke swallow screen - TSH - Continue home donepezil      ------------------------------------------------------------------------------   History of Present Illness: The patient is an 87 year old female with a PMHx of HTN, hypertrophic obstructive cardiomyopathy, hypothyroidism, shingles, IBS, GERD and vitamin D deficiency who presents to the [facility] ED with acute onset of left facial droop, left arm and leg weakness and slurred speech. LKN was not clear, patient stating between 8-9 AM initially, then 7 AM on subsequent interview. She woke up normal and was able to ambulate, with time of symptom onset not exactly known. She lives at home alone. Family tried to call and got no answer, so someone from the family came by her home where she was found on the floor. Family member also noted vomitus on the floor. EMS was called and on arrival they noted left sided weakness, left facial droop and slurred speech. She also had a left gaze preference, which had resolved by the time of her arrival to the ED.  Per EMS, her slurred speech had improved somewhat by the time of her arrival to the ED. Her BP per EMS was 180/80. CBG was normal.      Past Medical History: Past Medical History:  Diagnosis Date   Decreased sense of smell    and taste-negative CT  scan-2011   Depression    Essential hypertension    GERD (gastroesophageal reflux disease)    Heart murmur    heard first time 4/12   History of mammogram    2010 and she does not want to repeat them anymore   HOCM (hypertrophic obstructive cardiomyopathy) ([place])    a. Dx by echo 05/2014.   Hypothyroidism    IBS (irritable bowel syndrome)    Impaired fasting glucose    Low back pain    Lumbar radiculopathy    with negative MRI (1991)   Postmenopausal    with osteopenia, bisphosphonates 1/05-1/11, improved osteopenia 4/12-repeat planned late 2015   Shingles    2015   Thyroid nodule    Vitamin D deficiency    repleted on weekly vitamin D 50000 iu      Past Surgical History: Past Surgical History:  Procedure Laterality Date   ABDOMINAL HYSTERECTOMY     BREAST BIOPSY     x2   CATARACT EXTRACTION     removal with IOL bilateral   GLAUCOMA SURGERY     laser   skin cancer removal     THYROIDECTOMY     subtotal      Medications:  No current facility-administered medications on file prior to encounter.   Current Outpatient Medications on File Prior to Encounter  Medication Sig Dispense Refill   Cholecalciferol (VITAMIN D-3) 25 MCG (1000 UT) CAPS Take 1 capsule by mouth daily.     donepezil (ARICEPT) 5 MG tablet Take 5 mg by mouth at bedtime.     hydrochlorothiazide (HYDRODIURIL) 25 MG tablet Take 25 mg by mouth daily.      levothyroxine (SYNTHROID, LEVOTHROID) 75 MCG tablet Take 75 mcg by mouth daily before breakfast.      pyridOXINE (VITAMIN B-6) 25 MG tablet Take 25 mg by mouth daily.     sertraline (ZOLOFT) 50 MG tablet Take 100 mg by mouth daily.     verapamil (CALAN-SR) 240 MG CR tablet Take 1 tablet (240 mg total) by mouth at bedtime. 30 tablet 3   vitamin C (ASCORBIC ACID) 500 MG tablet Take 500 mg by mouth daily.     zinc gluconate 50 MG tablet Take 50 mg by mouth daily.    Daily ASA      Social History: No alcohol or drug use. Never smoker.   Family History:   Reviewed in Epic   ROS: The patient is a poor historian, limiting provision of ROS.     Anticoagulant use:  No.    Antiplatelet use: ASA   Examination:      1A: Level of Consciousness - 0 1B: Ask Month and Age - 2 1C: Blink Eyes & Squeeze Hands - 0 2: Test Horizontal Extraocular Movements - 0 3: Test Visual Fields - 0 4: Test Facial Palsy (Use Grimace if Obtunded) - 1 5A: Test Left Arm Motor Drift - 4 5B: Test Right Arm Motor Drift - 3 6A: Test Left Leg Motor Drift - 0 6B: Test Right Leg Motor Drift - 0 7: Test Limb Ataxia (FNF/Heel-Shin) - 0 8: Test Sensation -  0 9: Test Language/Aphasia - 0 10: Test Dysarthria -  Severe Dysarthria: 1 11: Test Extinction/Inattention - Extinction to bilateral simultaneous stimulation 0   NIHSS Score: 11     Patient/Family was informed the Neurology Consult would occur via TeleHealth consult by way of interactive audio and video telecommunications and consented to receiving care in this manner.   Patient is being evaluated for possible acute neurologic impairment and high pretest probability of imminent or life-threatening deterioration. I spent total of 40 minutes providing care to this patient, including time for face to face visit via telemedicine, review of medical records, imaging studies and discussion of findings with providers, the patient and/or family.   Electronically signed: Dr. Kerney Elbe

## 2021-05-29 NOTE — ED Notes (Signed)
Patient is not a TPA candidate per neurologist.

## 2021-05-30 ENCOUNTER — Inpatient Hospital Stay (HOSPITAL_COMMUNITY)
Admit: 2021-05-30 | Discharge: 2021-05-30 | Disposition: A | Discharge status: Home / Self Care | Payer: Medicare Other | Attending: Internal Medicine | Admitting: Internal Medicine

## 2021-05-30 ENCOUNTER — Inpatient Hospital Stay (HOSPITAL_COMMUNITY): Payer: Medicare Other

## 2021-05-30 DIAGNOSIS — I69952 Hemiplegia and hemiparesis following unspecified cerebrovascular disease affecting left dominant side: Secondary | ICD-10-CM | POA: Diagnosis not present

## 2021-05-30 DIAGNOSIS — G9340 Encephalopathy, unspecified: Secondary | ICD-10-CM

## 2021-05-30 DIAGNOSIS — R471 Dysarthria and anarthria: Secondary | ICD-10-CM | POA: Diagnosis not present

## 2021-05-30 LAB — LIPID PANEL
Cholesterol: 203 mg/dL — ABNORMAL HIGH (ref 0–200)
HDL: 37 mg/dL — ABNORMAL LOW (ref >40)
LDL Cholesterol: 127 mg/dL — ABNORMAL HIGH (ref 0–99)
Total CHOL/HDL Ratio: 5.5 RATIO
Triglycerides: 196 mg/dL — ABNORMAL HIGH (ref <150)
VLDL: 39 mg/dL (ref 0–40)

## 2021-05-30 LAB — HEMOGLOBIN A1C
Hgb A1c MFr Bld: 5.7 % — ABNORMAL HIGH (ref 4.8–5.6)
Mean Plasma Glucose: 116.89 mg/dL

## 2021-05-30 LAB — GLUCOSE, CAPILLARY
Glucose-Capillary: 116 mg/dL — ABNORMAL HIGH (ref 70–99)
Glucose-Capillary: 138 mg/dL — ABNORMAL HIGH (ref 70–99)
Glucose-Capillary: 111 mg/dL — ABNORMAL HIGH (ref 70–99)
Glucose-Capillary: 125 mg/dL — ABNORMAL HIGH (ref 70–99)

## 2021-05-30 LAB — ECHOCARDIOGRAM COMPLETE
AV Mean grad: 4 mmHg
AV Peak grad: 8.4 mmHg
Ao pk vel: 1.45 m/s
Area-P 1/2: 6.17 cm2
Height: 62 in
S' Lateral: 1.51 cm
Weight: 2129.6 oz

## 2021-05-30 MED ORDER — HYDRALAZINE HCL 20 MG/ML IJ SOLN: 10.0000 mg | Freq: Three times a day (TID) | INTRAMUSCULAR | Status: DC | PRN

## 2021-05-30 MED ORDER — SERTRALINE HCL 50 MG PO TABS
100.0000 mg | ORAL_TABLET | Freq: Every day | ORAL | Status: DC
  Administered 2021-05-30 – 2021-06-01 (×3): 100 mg via ORAL
  Filled 2021-05-30 (×3): qty 2

## 2021-05-30 MED ORDER — LEVOTHYROXINE SODIUM 75 MCG PO TABS
75.0000 ug | ORAL_TABLET | Freq: Every day | ORAL | Status: DC
  Administered 2021-05-31 – 2021-06-01 (×2): 75 ug via ORAL
  Filled 2021-05-30 (×2): qty 1

## 2021-05-30 MED ORDER — ASCORBIC ACID 500 MG PO TABS
500.0000 mg | ORAL_TABLET | Freq: Every day | ORAL | Status: DC
  Administered 2021-05-30 – 2021-06-01 (×3): 500 mg via ORAL
  Filled 2021-05-30 (×3): qty 1

## 2021-05-30 MED ORDER — DONEPEZIL HCL 5 MG PO TABS
5.0000 mg | ORAL_TABLET | Freq: Every day | ORAL | Status: DC
  Administered 2021-05-30 – 2021-05-31 (×2): 5 mg via ORAL
  Filled 2021-05-30 (×2): qty 1

## 2021-05-30 MED ORDER — PANTOPRAZOLE SODIUM 40 MG PO TBEC
40.0000 mg | DELAYED_RELEASE_TABLET | Freq: Every day | ORAL | Status: DC
  Administered 2021-05-30 – 2021-06-01 (×3): 40 mg via ORAL
  Filled 2021-05-30 (×3): qty 1

## 2021-05-30 MED ORDER — METHOCARBAMOL 1000 MG/10ML IJ SOLN
500.0000 mg | Freq: Three times a day (TID) | INTRAVENOUS | Status: DC | PRN
  Administered 2021-05-30: 500 mg via INTRAVENOUS
  Filled 2021-05-30: qty 5

## 2021-05-30 MED ORDER — ASPIRIN 325 MG PO TABS
325.0000 mg | ORAL_TABLET | Freq: Every day | ORAL | Status: DC
  Administered 2021-06-01: 325 mg via ORAL
  Filled 2021-05-30 (×2): qty 1

## 2021-05-30 MED ORDER — ZINC SULFATE 220 (50 ZN) MG PO CAPS
220.0000 mg | ORAL_CAPSULE | Freq: Every day | ORAL | Status: DC
  Administered 2021-05-30 – 2021-06-01 (×3): 220 mg via ORAL
  Filled 2021-05-30 (×3): qty 1

## 2021-05-30 MED ORDER — LORAZEPAM 2 MG/ML IJ SOLN
1.0000 mg | Freq: Once | INTRAMUSCULAR | Status: AC
  Administered 2021-05-30: 1 mg via INTRAVENOUS
  Filled 2021-05-30: qty 1

## 2021-05-30 NOTE — Evaluation (Addendum)
Speech Language Pathology Evaluation Patient Details Name: Vickie Mckee MRN: [id number] DOB: 03-Sep-1931 Today's Date: 05/30/2021 Time: [id number] SLP Time Calculation (min) (ACUTE ONLY): 25 min  Problem List:  Patient Active Problem List   Diagnosis Date Noted   Ischemic stroke ([place]) 05/29/2021   Essential hypertension    Hypothyroidism    HOCM (hypertrophic obstructive cardiomyopathy) ([place]) 07/23/2014   Weakness of both legs 07/23/2014   Heart murmur    Past Medical History:  Past Medical History:  Diagnosis Date   Decreased sense of smell    and taste-negative CT scan-2011   Depression    Essential hypertension    GERD (gastroesophageal reflux disease)    Heart murmur    heard first time 4/12   History of mammogram    2010 and she does not want to repeat them anymore   HOCM (hypertrophic obstructive cardiomyopathy) ([place])    a. Dx by echo 05/2014.   Hypothyroidism    IBS (irritable bowel syndrome)    Impaired fasting glucose    Low back pain    Lumbar radiculopathy    with negative MRI (1991)   Postmenopausal    with osteopenia, bisphosphonates 1/05-1/11, improved osteopenia 4/12-repeat planned late 2015   Shingles    2015   Thyroid nodule    Vitamin D deficiency    repleted on weekly vitamin D 50000 iu   Past Surgical History:  Past Surgical History:  Procedure Laterality Date   ABDOMINAL HYSTERECTOMY     BREAST BIOPSY     x2   CATARACT EXTRACTION     removal with IOL bilateral   GLAUCOMA SURGERY     laser   skin cancer removal     THYROIDECTOMY     subtotal   HPI:  87 y.o. female with medical history significant of essential hypertension, hypothyroidism, gastroesophageal reflux disease, depression, vitamin D deficiency, history of HOCM and chronic low back pain; who presented to the hospital secondary to left-sided weakness and dysarthria.  Patient is on clear about exact  Timing of initiation of her symptoms; she reported last felt normal after waking up  this morning around 7 AM.  Patient reported ambulating as she normally does after waking up in the morning, and anywhere in between 8 and 9 she has no recollection of the events, family was not able to reach her and contacted EMS in the way to her house.  Patient was found on the floor demonstrating left-sided weakness, left lateralized gaze, vomiting content next to her and also dysarthria; MRI reveals "Acute right pontine infarct. Mild associated edema without mass effect. Multiple small remote bilateral cerebellar lacunar infarcts BSE/MBS completed recommending Dysphagia 1/nectar-thickened liquids; SLE generated.  Assessment / Plan / Recommendation Clinical Impression  Pt administered the SLUMS ([place] Mental Status Examination) with the following score obtained 3/26 (with writing portion eliminated) with 27/30 being a normal score on this assessment.  Deficits in the areas of orientation, auditory comprehension, attention and memory noted with specific difficulty observed with retaining words after a time delay, answering questions within a short paragraph, simple calculation task, repeating numbers backwards past 2 digits, and directives past 2-step with functional tasks.  Pt was only able to state name, but unable to determine date, situation and/or place during this assessment.  Pt's speech was mild-moderately dysarthric and perseverative with phrase-conversation being 50-75% intelligible d/t decreased vocal intensity and imprecise articulation during simple conversation.  Pt with perseveration and tangential speech during conversation with overall attention  impacting all areas of impairment.  Pt was able to read simple phrases/sentences aloud to decode information, but was unable to comprehend and/or retain information from a short paragraph when asked questions regarding information presented.  Recommend pt continue to receive ST for dysphagia and speech/cognitive-linguistic deficits  during acute stay.  Thank you for this consultation.    SLP Assessment  SLP Recommendation/Assessment: Patient needs continued Speech Language Pathology Services SLP Visit Diagnosis: Dysarthria and anarthria (R47.1);Attention and concentration deficit;Cognitive communication deficit (R41.841) Attention and concentration deficit following: Cerebral infarction    Follow Up Recommendations  24 hour supervision/assistance;Other (comment) (TBD)    Frequency and Duration min 2x/week  1 week      SLP Evaluation Cognition  Overall Cognitive Status: Impaired/Different from baseline Arousal/Alertness: Awake/alert Orientation Level: Oriented to person;Disoriented to place;Disoriented to time;Disoriented to situation Attention: Sustained Sustained Attention: Impaired Sustained Attention Impairment: Verbal basic;Functional basic Memory: Impaired Memory Impairment: Retrieval deficit;Decreased recall of new information;Decreased short term memory Decreased Short Term Memory: Verbal basic;Functional basic Immediate Memory Recall: Sock;Blue;Bed Memory Recall Sock: Not able to recall Memory Recall Blue: Not able to recall Memory Recall Bed: Not able to recall Awareness: Impaired Awareness Impairment: Anticipatory impairment Problem Solving: Impaired Problem Solving Impairment: Verbal basic;Functional basic Behaviors: Perseveration Safety/Judgment: Impaired       Comprehension  Auditory Comprehension Overall Auditory Comprehension: Impaired Commands: Impaired Two Step Basic Commands: 25-49% accurate Conversation: Simple Interfering Components: Attention;Working Field seismologist: Repetition Retail banker: Within [employer] Reading Comprehension Reading Status: Unable to assess (comment) (Pt able to decode but comprehension/memory impact overall ability)    Expression Expression Primary Mode of Expression: Verbal Verbal  Expression Overall Verbal Expression: Appears within functional limits for tasks assessed Level of Generative/Spontaneous Verbalization: Conversation Repetition: No impairment Naming: No impairment Pragmatics: Impairment Impairments: Topic maintenance;Turn Taking;Topic appropriateness Interfering Components: Attention Non-Verbal Means of Communication: Not applicable Written Expression Dominant Hand: Right Written Expression: Not tested   Oral / Motor  Oral Motor/Sensory Function Overall Oral Motor/Sensory Function: Mild impairment Facial ROM: Reduced left Facial Symmetry: Abnormal symmetry left Facial Strength: Reduced left Facial Sensation: Reduced left Lingual ROM: Reduced left Lingual Symmetry: Abnormal symmetry left Lingual Strength: Reduced Lingual Sensation: Reduced Motor Speech Overall Motor Speech: Appears within functional limits for tasks assessed Respiration: Within functional limits Phonation: Low vocal intensity Resonance: Within functional limits Articulation: Impaired Level of Impairment: Phrase Intelligibility: Intelligibility reduced Word: 50-74% accurate Phrase: 50-74% accurate Sentence: 50-74% accurate Conversation: 25-49% accurate Motor Planning: Witnin functional limits Motor Speech Errors: Not applicable                       Elvina Sidle, M.S., CCC-SLP 05/30/2021, 2:23 PM

## 2021-05-30 NOTE — Consult Note (Signed)
Ahoskie A. Merlene Laughter, MD     [email or web address]          Vickie Mckee is an 87 y.o. female.   ASSESSMENT/PLAN:  Right pontine infarct presenting with the severe dysarthria and left hemiplegia: Risk factors age and the hypertension. Dual antiplatelet agents are recommended for 1 month. Afterwards single agent aspirin 81 mg chewable.  Statin is also recommended. The patient will needed tends physical and occupational therapies given the profound weakness.  Small intracranial aneurysms: Annual surveillance would CTA is recommended. Intervention not recommended at this time given the age, size of the aneurysms and comorbidities.     The patient is 62 and lives by herself. She typically gets around with a walker. She has twin daughters that checks on her frequently. They had difficulties getting hold of her and the decided to checking her physically. She was noted to be on the floor with spasming of the left lower extremity dysarthric and weak on the left side. She continues to have significant weakness on the left side and the her symptoms persist. She complains of significant spasm in of the left lower extremity. It appears that she was amnestic to how she fell on the floor. No visible injuries are reported. She apparently has been noted to have mild memory impairment. The review systems otherwise negative.     GENERAL:  She is doing well at this time.  HEENT:  Neck is supple no trauma noted.  ABDOMEN: soft  EXTREMITIES: No edema   BACK: Normal  SKIN: Normal by inspection.    MENTAL STATUS:  she is awake and cooperates with evaluation. She has a severe dysarthria. She is oriented to her age but not to the current month thinking it is April. She knows she is in the hospital.  CRANIAL NERVES: Pupils are equal, round and reactive to light and accomodation; extra ocular movements are full, there is no significant nystagmus; visual fields are full; upper and lower  facial muscles are normal in strength and symmetric, there is flattening of the L nasolabial folds; tongue is midline; uvula is midline; shoulder elevation is normal.  MOTOR:  Right side shows normal tone, bulk and strength without drift. Left side is 0/5. The left leg is also abducted and externally rotated.  COORDINATION: Right finger to nose is normal, No rest tremor; no intention tremor; no postural tremor; no bradykinesia.  REFLEXES: Deep tendon reflexes are symmetrical and normal.   SENSATION: Normal to pain.  NIHSS 1,3,3,2  EQUAL 9.  Blood pressure (!) 154/77, pulse 78, temperature 98.1 F (36.7 C), temperature source Oral, resp. rate 20, height '5\' 2"'$  (1.575 m), weight 60.4 kg, SpO2 97 %.  Past Medical History:  Diagnosis Date   Decreased sense of smell    and taste-negative CT scan-2011   Depression    Essential hypertension    GERD (gastroesophageal reflux disease)    Heart murmur    heard first time 4/12   History of mammogram    2010 and she does not want to repeat them anymore   HOCM (hypertrophic obstructive cardiomyopathy) ([place])    a. Dx by echo 05/2014.   Hypothyroidism    IBS (irritable bowel syndrome)    Impaired fasting glucose    Low back pain    Lumbar radiculopathy    with negative MRI (1991)   Postmenopausal    with osteopenia, bisphosphonates 1/05-1/11, improved osteopenia 4/12-repeat planned late 2015   Shingles    2015  Thyroid nodule    Vitamin D deficiency    repleted on weekly vitamin D 50000 iu    Past Surgical History:  Procedure Laterality Date   ABDOMINAL HYSTERECTOMY     BREAST BIOPSY     x2   CATARACT EXTRACTION     removal with IOL bilateral   GLAUCOMA SURGERY     laser   skin cancer removal     THYROIDECTOMY     subtotal    Family History  Problem Relation Age of Onset   Breast cancer Mother    Hypertension Father    CVA Father    Heart failure Sister    CAD Sister    Kidney disease Sister    COPD Brother    Lung  cancer Brother    CAD Brother    Diabetes Brother    Stroke Father    Heart attack Neg Hx     Social History:  reports that she has never smoked. She has never used smokeless tobacco. She reports that she does not drink alcohol and does not use drugs.  Allergies:  Allergies  Allergen Reactions   Codeine Nausea Only    Medications: Prior to Admission medications   Medication Sig Start Date End Date Taking? Authorizing Provider  Cholecalciferol (VITAMIN D-3) 25 MCG (1000 UT) CAPS Take 1 capsule by mouth daily.   Yes [provider]  donepezil (ARICEPT) 5 MG tablet Take 5 mg by mouth at bedtime.   Yes [provider]  hydrochlorothiazide (HYDRODIURIL) 25 MG tablet Take 25 mg by mouth daily.  07/27/15  Yes [provider]  levothyroxine (SYNTHROID, LEVOTHROID) 75 MCG tablet Take 75 mcg by mouth daily before breakfast.  07/14/14  Yes [provider]  pyridOXINE (VITAMIN B-6) 25 MG tablet Take 25 mg by mouth daily.   Yes [provider]  sertraline (ZOLOFT) 50 MG tablet Take 100 mg by mouth daily. 07/27/15  Yes [provider]  verapamil (CALAN-SR) 240 MG CR tablet Take 1 tablet (240 mg total) by mouth at bedtime. 07/18/15  Yes Jettie Booze, MD  vitamin C (ASCORBIC ACID) 500 MG tablet Take 500 mg by mouth daily.   Yes [provider]  zinc gluconate 50 MG tablet Take 50 mg by mouth daily.   Yes [provider]    Scheduled Meds:  vitamin C  500 mg Oral Daily   [START ON 05/31/2021] aspirin  325 mg Oral Daily   clopidogrel  75 mg Oral Daily   donepezil  5 mg Oral QHS   enoxaparin (LOVENOX) injection  40 mg Subcutaneous Q24H   [START ON 05/31/2021] levothyroxine  75 mcg Oral QAC breakfast   pantoprazole  40 mg Oral Daily   sertraline  100 mg Oral Daily   zinc sulfate  220 mg Oral Daily   Continuous Infusions:  0.9 % NaCl with KCl 40 mEq / L 75 mL/hr at 05/29/21 1745   methocarbamol (ROBAXIN) IV 500 mg  (05/30/21 1206)   PRN Meds:.acetaminophen **OR** acetaminophen (TYLENOL) oral liquid 160 mg/5 mL **OR** acetaminophen, hydrALAZINE, methocarbamol (ROBAXIN) IV     Results for orders placed or performed during the hospital encounter of 05/29/21 (from the past 48 hour(s))  Ethanol     Status: None   Collection Time: 05/29/21  1:27 PM  Result Value Ref Range   Alcohol, Ethyl (B) <10 <10 mg/dL    Comment: (NOTE) Lowest detectable limit for serum alcohol is 10 mg/dL.  For medical  purposes only. Performed at [facility], [address], [place], [place] [address]   Protime-INR     Status: None   Collection Time: 05/29/21  1:27 PM  Result Value Ref Range   Prothrombin Time 13.5 11.4 - 15.2 seconds   INR 1.0 0.8 - 1.2    Comment: (NOTE) INR goal varies based on device and disease states. Performed at [facility], [address] [place], [place] [address]   APTT     Status: None   Collection Time: 05/29/21  1:27 PM  Result Value Ref Range   aPTT 25 24 - 36 seconds    Comment: Performed at [facility], [address] [place], [place] [address]  CBC     Status: Abnormal   Collection Time: 05/29/21  1:27 PM  Result Value Ref Range   WBC 21.3 (H) 4.0 - 10.5 K/uL   RBC 5.22 (H) 3.87 - 5.11 MIL/uL   Hemoglobin 15.6 (H) 12.0 - 15.0 g/dL   HCT 45.5 36.0 - 46.0 %   MCV 87.2 80.0 - 100.0 fL   MCH 29.9 26.0 - 34.0 pg   MCHC 34.3 30.0 - 36.0 g/dL   RDW 13.7 11.5 - 15.5 %   Platelets 317 150 - 400 K/uL   nRBC 0.0 0.0 - 0.2 %    Comment: Performed at [facility], [address] [place], [place] [address]  Differential     Status: Abnormal   Collection Time: 05/29/21  1:27 PM  Result Value Ref Range   Neutrophils Relative % 86 %   Neutro Abs 18.7 (H) 1.7 - 7.7 K/uL   Band Neutrophils 2 %   Lymphocytes Relative 11 %   Lymphs Abs 2.3 0.7 - 4.0 K/uL   Monocytes Relative 0 %   Monocytes Absolute 0.0 (L) 0.1 - 1.0 K/uL   Eosinophils Relative 1 %   Eosinophils Absolute 0.2 0.0 - 0.5  K/uL   Basophils Relative 0 %   Basophils Absolute 0.0 0.0 - 0.1 K/uL    Comment: Performed at [facility], [address] [place], [place] [address]  Comprehensive metabolic panel     Status: Abnormal   Collection Time: 05/29/21  1:27 PM  Result Value Ref Range   Sodium 136 135 - 145 mmol/L   Potassium 2.6 (LL) 3.5 - 5.1 mmol/L    Comment: CRITICAL RESULT CALLED TO, READ BACK BY AND VERIFIED WITH: LONG,J 1405 05/29/2021 COLEMAN,R    Chloride 100 98 - 111 mmol/L   CO2 23 22 - 32 mmol/L   Glucose, Bld 137 (H) 70 - 99 mg/dL    Comment: Glucose reference range applies only to samples taken after fasting for at least 8 hours.   BUN 18 8 - 23 mg/dL   Creatinine, Ser 0.90 0.44 - 1.00 mg/dL   Calcium 8.9 8.9 - 10.3 mg/dL   Total Protein 7.4 6.5 - 8.1 g/dL   Albumin 4.2 3.5 - 5.0 g/dL   AST 47 (H) 15 - 41 U/L   ALT 20 0 - 44 U/L   Alkaline Phosphatase 37 (L) 38 - 126 U/L   Total Bilirubin 1.2 0.3 - 1.2 mg/dL   GFR, Estimated >60 >60 mL/min    Comment: (NOTE) Calculated using the CKD-EPI Creatinine Equation (2021)    Anion gap 13 5 - 15    Comment: Performed at [facility], [address], [place], [place] [address]  Urine rapid drug screen (hosp performed)     Status: None  Collection Time: 05/29/21  1:27 PM  Result Value Ref Range   Opiates NONE DETECTED NONE DETECTED   Cocaine NONE DETECTED NONE DETECTED   Benzodiazepines NONE DETECTED NONE DETECTED   Amphetamines NONE DETECTED NONE DETECTED   Tetrahydrocannabinol NONE DETECTED NONE DETECTED   Barbiturates NONE DETECTED NONE DETECTED    Comment: (NOTE) DRUG SCREEN FOR MEDICAL PURPOSES ONLY.  IF CONFIRMATION IS NEEDED FOR ANY PURPOSE, NOTIFY LAB WITHIN 5 DAYS.  LOWEST DETECTABLE LIMITS FOR URINE DRUG SCREEN Drug Class                     Cutoff (ng/mL) Amphetamine and metabolites    1000 Barbiturate and metabolites    200 Benzodiazepine                 [id number] Tricyclics and metabolites     300 Opiates and metabolites         300 Cocaine and metabolites        300 THC                            50 Performed at [facility], [address], [place], [place] [address]   Urinalysis, Routine w reflex microscopic Urine, Clean Catch     Status: Abnormal   Collection Time: 05/29/21  1:27 PM  Result Value Ref Range   Color, Urine YELLOW YELLOW   APPearance CLEAR CLEAR   Specific Gravity, Urine 1.043 (H) 1.005 - 1.030   pH 8.0 5.0 - 8.0   Glucose, UA NEGATIVE NEGATIVE mg/dL   Hgb urine dipstick MODERATE (A) NEGATIVE   Bilirubin Urine NEGATIVE NEGATIVE   Ketones, ur 80 (A) NEGATIVE mg/dL   Protein, ur 30 (A) NEGATIVE mg/dL   Nitrite NEGATIVE NEGATIVE   Leukocytes,Ua SMALL (A) NEGATIVE   RBC / HPF 11-20 0 - 5 RBC/hpf   WBC, UA 6-10 0 - 5 WBC/hpf   Bacteria, UA NONE SEEN NONE SEEN   Squamous Epithelial / LPF 0-5 0 - 5    Comment: Performed at [facility], [address], [place], [place] [address]  TSH     Status: None   Collection Time: 05/29/21  1:28 PM  Result Value Ref Range   TSH 2.093 0.350 - 4.500 uIU/mL    Comment: Performed by a 3rd Generation assay with a functional sensitivity of <=0.01 uIU/mL. Performed at [facility], [address], [place], [place] [address]   Vitamin B12     Status: None   Collection Time: 05/29/21  1:28 PM  Result Value Ref Range   Vitamin B-12 584 180 - 914 pg/mL    Comment: (NOTE) This assay is not validated for testing neonatal or myeloproliferative syndrome specimens for Vitamin B12 levels. Performed at [facility], [address] [place], [place] [address]   Magnesium     Status: None   Collection Time: 05/29/21  1:28 PM  Result Value Ref Range   Magnesium 1.8 1.7 - 2.4 mg/dL    Comment: Performed at [facility], [address], [place], [place] [address]  VITAMIN D 25 Hydroxy (Vit-D Deficiency, Fractures)     Status: None   Collection Time: 05/29/21  1:28 PM  Result Value Ref Range   Vit D, 25-Hydroxy 36.20 30 - 100 ng/mL    Comment: (NOTE) Vitamin D  deficiency has been defined by the Institute of Medicine  and an Endocrine Society practice guideline as a level of  serum 25-OH  vitamin D less than 20 ng/mL (1,2). The Endocrine Society went on to  further define vitamin D insufficiency as a level between 21 and 29  ng/mL (2).  1. IOM (Institute of Medicine). 2010. Dietary reference intakes for  calcium and D. [place]: The [employer]. 2. Holick MF, Binkley [place], Bischoff-Ferrari HA, et al. Evaluation,  treatment, and prevention of vitamin D deficiency: an Endocrine  Society clinical practice guideline, JCEM. 2011 Jul; 96(7): 1911-30.  Performed at [facility], [place] [address] [place], [place] [address]   Resp Panel by RT-PCR (Flu A&B, Covid) Nasopharyngeal Swab     Status: None   Collection Time: 05/29/21  1:40 PM   Specimen: Nasopharyngeal Swab; Nasopharyngeal(NP) swabs in vial transport medium  Result Value Ref Range   SARS Coronavirus 2 by RT PCR NEGATIVE NEGATIVE    Comment: (NOTE) SARS-CoV-2 target nucleic acids are NOT DETECTED.  The SARS-CoV-2 RNA is generally detectable in upper respiratory specimens during the acute phase of infection. The lowest concentration of SARS-CoV-2 viral copies this assay can detect is 138 copies/mL. A negative result does not preclude SARS-Cov-2 infection and should not be used as the sole basis for treatment or other patient management decisions. A negative result may occur with  improper specimen collection/handling, submission of specimen other than nasopharyngeal swab, presence of viral mutation(s) within the areas targeted by this assay, and inadequate number of viral copies(<138 copies/mL). A negative result must be combined with clinical observations, patient history, and epidemiological information. The expected result is Negative.  Fact Sheet for Patients:  EntrepreneurPulse.com.au  Fact Sheet for Healthcare Providers:   IncredibleEmployment.be  This test is no t yet approved or cleared by the [place] FDA and  has been authorized for detection and/or diagnosis of SARS-CoV-2 by FDA under an Emergency Use Authorization (EUA). This EUA will remain  in effect (meaning this test can be used) for the duration of the COVID-19 declaration under Section 564(b)(1) of the Act, 21 U.S.C.section 360bbb-3(b)(1), unless the authorization is terminated  or revoked sooner.       Influenza A by PCR NEGATIVE NEGATIVE   Influenza B by PCR NEGATIVE NEGATIVE    Comment: (NOTE) The Xpert Xpress SARS-CoV-2/FLU/RSV plus assay is intended as an aid in the diagnosis of influenza from Nasopharyngeal swab specimens and should not be used as a sole basis for treatment. Nasal washings and aspirates are unacceptable for Xpert Xpress SARS-CoV-2/FLU/RSV testing.  Fact Sheet for Patients: EntrepreneurPulse.com.au  Fact Sheet for Healthcare Providers: IncredibleEmployment.be  This test is not yet approved or cleared by the [place] FDA and has been authorized for detection and/or diagnosis of SARS-CoV-2 by FDA under an Emergency Use Authorization (EUA). This EUA will remain in effect (meaning this test can be used) for the duration of the COVID-19 declaration under Section 564(b)(1) of the Act, 21 U.S.C. section 360bbb-3(b)(1), unless the authorization is terminated or revoked.  Performed at [facility], [address], [place], [place] [address]   CBG monitoring, ED     Status: Abnormal   Collection Time: 05/29/21  1:43 PM  Result Value Ref Range   Glucose-Capillary 141 (H) 70 - 99 mg/dL    Comment: Glucose reference range applies only to samples taken after fasting for at least 8 hours.  I-stat chem 8, ED     Status: Abnormal   Collection Time: 05/29/21  2:01 PM  Result Value Ref Range   Sodium 141 135 - 145  mmol/L   Potassium 2.6 (LL) 3.5 - 5.1 mmol/L    Chloride 100 98 - 111 mmol/L   BUN 18 8 - 23 mg/dL   Creatinine, Ser 0.70 0.44 - 1.00 mg/dL   Glucose, Bld 133 (H) 70 - 99 mg/dL    Comment: Glucose reference range applies only to samples taken after fasting for at least 8 hours.   Calcium, Ion 1.15 1.15 - 1.40 mmol/L   TCO2 27 22 - 32 mmol/L   Hemoglobin 15.6 (H) 12.0 - 15.0 g/dL   HCT 46.0 36.0 - 46.0 %  Hemoglobin A1c     Status: Abnormal   Collection Time: 05/30/21  3:51 AM  Result Value Ref Range   Hgb A1c MFr Bld 5.7 (H) 4.8 - 5.6 %    Comment: (NOTE) Pre diabetes:          5.7%-6.4%  Diabetes:              >6.4%  Glycemic control for   <7.0% adults with diabetes    Mean Plasma Glucose 116.89 mg/dL    Comment: Performed at [facility] [address] [place], [place] [address]  Lipid panel     Status: Abnormal   Collection Time: 05/30/21  3:51 AM  Result Value Ref Range   Cholesterol 203 (H) 0 - 200 mg/dL   Triglycerides 196 (H) <150 mg/dL   HDL 37 (L) >40 mg/dL   Total CHOL/HDL Ratio 5.5 RATIO   VLDL 39 0 - 40 mg/dL   LDL Cholesterol 127 (H) 0 - 99 mg/dL    Comment:        Total Cholesterol/HDL:CHD Risk Coronary Heart Disease Risk Table                     Men   Women  1/2 Average Risk   3.4   3.3  Average Risk       5.0   4.4  2 X Average Risk   9.6   7.1  3 X Average Risk  23.4   11.0        Use the calculated Patient Ratio above and the CHD Risk Table to determine the patient's CHD Risk.        ATP III CLASSIFICATION (LDL):  <100     mg/dL   Optimal  100-129  mg/dL   Near or Above                    Optimal  130-159  mg/dL   Borderline  160-189  mg/dL   High  >190     mg/dL   Very High Performed at [facility], [address], [place], [place] [address]   Glucose, capillary     Status: Abnormal   Collection Time: 05/30/21  7:54 AM  Result Value Ref Range   Glucose-Capillary 116 (H) 70 - 99 mg/dL    Comment: Glucose reference range applies only to samples taken after fasting for at least 8  hours.  Glucose, capillary     Status: Abnormal   Collection Time: 05/30/21 11:54 AM  Result Value Ref Range   Glucose-Capillary 138 (H) 70 - 99 mg/dL    Comment: Glucose reference range applies only to samples taken after fasting for at least 8 hours.  Glucose, capillary     Status: Abnormal   Collection Time: 05/30/21  5:09 PM  Result Value Ref Range   Glucose-Capillary 111 (H) 70 - 99 mg/dL  Comment: Glucose reference range applies only to samples taken after fasting for at least 8 hours.    Studies/Results:  TTE  1. No SAM noted mid/apical cavity obliteration in systole with some flow  acceleration peak velocity in 50msec range . Left ventricular ejection  fraction, by estimation, is 60 to 65%. The left ventricle has normal  function. The left ventricle has no  regional wall motion abnormalities. There is severe left ventricular  hypertrophy. Left ventricular diastolic parameters are consistent with  Grade I diastolic dysfunction (impaired relaxation).   2. Right ventricular systolic function is normal. The right ventricular  size is normal.   3. The mitral valve is normal in structure. No evidence of mitral valve  regurgitation. No evidence of mitral stenosis.   4. The aortic valve is normal in structure. Aortic valve regurgitation is  not visualized. No aortic stenosis is present.   5. The inferior vena cava is normal in size with greater than 50%  respiratory variability, suggesting right atrial pressure of 3 mmHg.       EEG Description: The posterior dominant rhythm consists of 6 Hz activity of moderate voltage (25-35 uV) seen predominantly in posterior head regions, symmetric and reactive to eye opening and eye closing. Sleep was characterized by vertex waves, sleep spindles (12 to 14 Hz), maximal frontocentral region. Hyperventilation and photic stimulation were not performed.      ABNORMALITY - Background slow   IMPRESSION: This study is suggestive of mild  diffuse encephalopathy, nonspecific etiology. No seizures or epileptiform discharges were seen throughout the recording.    HEAD NECK CTA IMPRESSION: CTA neck:   1. The common carotid and internal carotid arteries are patent within the neck without stenosis. Mild atherosclerotic plaque within both carotid systems within the neck, as described. 2. Vertebral arteries patent within the neck. Mild-to-moderate stenosis within the distal V2 segment of the non-dominant left vertebral artery. 3. Mild nonspecific fusiform dilation of the distal cervical left ICA to 7 mm.   CTA head:   1. No intracranial large vessel occlusion or proximal high-grade arterial stenosis identified. 2. Intracranial atherosclerotic disease, as described. 3. 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 At least one branch vessel arises from this aneurysm. 4. 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      BRAIN MRI FINDINGS: Motion limited study.  Within this limitation:   Brain: Acute right pontine infarct. Mild associated edema without mass effect. Mild for age scattered T2 hyperintensities in the white matter, nonspecific but likely related to chronic microvascular ischemic disease. Multiple small remote bilateral cerebellar lacunar infarcts. Moderate atrophy with prominence of the extra-axial spaces. Bilateral choroid plexus xanthogranulomas, which restrict diffusion. No hydrocephalus. No midline shift. No evidence of acute hemorrhage.   Vascular: Further evaluated on same day CTA.   Skull and upper cervical spine: Normal marrow signal.   Sinuses/Orbits: Mild ethmoid air cell mucosal thickening. Small volume of frothy secretions in the right sphenoid sinus. No acute orbital findings.   Other: No sizable mastoid effusions.   IMPRESSION: 1. Acute right pontine infarct. Mild associated edema without mass effect. 2. Multiple small  remote bilateral cerebellar lacunar infarcts. 3. Mild for age chronic microvascular ischemic disease and moderate atrophy.     THE MRI SCAN IS REVIEWED IN PERSON AND SHOWS ACUTE INFARCT INVOLVING RIGHT PONTINE [facility]. IS VERY LITTLE ATROPHY FOR AGE ALSO THE WHITE CHANGES NOTED. NO HEMORRHAGE.  Lason Eveland A. Merlene Laughter, M.D.  Diplomate, Tax adviser of Psychiatry and Neurology ( Neurology). 05/30/2021, 5:48 PM

## 2021-05-30 NOTE — Evaluation (Addendum)
Clinical/Bedside Swallow Evaluation Patient Details  Name: Vickie Mckee MRN: [id number] Date of Birth: April 08, 1931  Today's Date: 05/30/2021 Time: SLP Start Time (ACUTE ONLY): 0830 SLP Stop Time (ACUTE ONLY): 0915 SLP Time Calculation (min) (ACUTE ONLY): 45 min  Past Medical History:  Past Medical History:  Diagnosis Date   Decreased sense of smell    and taste-negative CT scan-2011   Depression    Essential hypertension    GERD (gastroesophageal reflux disease)    Heart murmur    heard first time 4/12   History of mammogram    2010 and she does not want to repeat them anymore   HOCM (hypertrophic obstructive cardiomyopathy) ([place])    a. Dx by echo 05/2014.   Hypothyroidism    IBS (irritable bowel syndrome)    Impaired fasting glucose    Low back pain    Lumbar radiculopathy    with negative MRI (1991)   Postmenopausal    with osteopenia, bisphosphonates 1/05-1/11, improved osteopenia 4/12-repeat planned late 2015   Shingles    2015   Thyroid nodule    Vitamin D deficiency    repleted on weekly vitamin D 50000 iu   Past Surgical History:  Past Surgical History:  Procedure Laterality Date   ABDOMINAL HYSTERECTOMY     BREAST BIOPSY     x2   CATARACT EXTRACTION     removal with IOL bilateral   GLAUCOMA SURGERY     laser   skin cancer removal     THYROIDECTOMY     subtotal   HPI:  Vickie Mckee is a 87 y.o. female with medical history significant of essential hypertension, hypothyroidism, gastroesophageal reflux disease, depression, vitamin D deficiency, history of HOCM and chronic low back pain; who presented to the hospital secondary to left-sided weakness and dysarthria. Patient was found on the floor demonstrating left-sided weakness, left lateralized gaze, vomiting content next to her and also dysarthria. MRI reveals "Acute right pontine infarct. Mild associated edema without mass effect. Multiple small remote bilateral cerebellar lacunar infarcts." Pt failed [facility]  stroke swallow, BSE requested.   Assessment / Plan / Recommendation Clinical Impression  Clinical swallowing evaluation completed while Pt was sitting upright in bed; Pt's two daughters were present at bedside for evaluation. Oral mechanism exam reveals L facial droop and decreased L ROM. Pt consumed ice chips without signs of airway compromise, however with increased amounts of thin liquid via cup and straw sips of liquid resulted in immediate coughing and wet vocal quality. Puree was initially consumed without incident but after 2-3 tsp bites Pt began to cough and report globus sensation. One regular trial resulted in poor oral awareness, decreased mastication and coordination of bolus. Recommend continue NPO (with the exception of single ice chips) pending MBSS completion later this am in order to objectively assess the swallowing function. SLP Visit Diagnosis: Dysphagia, unspecified (R13.10)    Aspiration Risk  Moderate aspiration risk    Diet Recommendation NPO   Liquid Administration via: Spoon (for single ice chips after oral care)    Other  Recommendations Oral Care Recommendations: Oral care QID;Oral care prior to ice chip/H20   Follow up Recommendations   Inpatient Rehab     Frequency and Duration min 2x/week          Prognosis Prognosis for Safe Diet Advancement: Good      Swallow Study   General Date of Onset: 05/29/21 HPI: Vickie Mckee is a 87 y.o. female with medical history significant  of essential hypertension, hypothyroidism, gastroesophageal reflux disease, depression, vitamin D deficiency, history of HOCM and chronic low back pain; who presented to the hospital secondary to left-sided weakness and dysarthria. Patient was found on the floor demonstrating left-sided weakness, left lateralized gaze, vomiting content next to her and also dysarthria. MRI reveals "Acute right pontine infarct. Mild associated edema without mass effect. Multiple small remote bilateral  cerebellar lacunar infarcts." Pt failed [facility] stroke swallow, BSE requested. Type of Study: Bedside Swallow Evaluation Previous Swallow Assessment: none in chart Diet Prior to this Study: NPO Temperature Spikes Noted: No Respiratory Status: Room air History of Recent Intubation: No Behavior/Cognition: Alert;Cooperative;Pleasant mood Oral Cavity Assessment: Dry Oral Care Completed by SLP: Recent completion by staff Oral Cavity - Dentition: Adequate natural dentition Vision: Functional for self-feeding Self-Feeding Abilities: Able to feed self;Needs assist;Needs set up Patient Positioning: Upright in bed Baseline Vocal Quality: Normal Volitional Cough: Strong Volitional Swallow: Able to elicit    Oral/Motor/Sensory Function Overall Oral Motor/Sensory Function: Moderate impairment Facial ROM: Reduced left;Suspected CN VII (facial) dysfunction Facial Symmetry: Abnormal symmetry left Facial Strength: Reduced left Facial Sensation: Reduced left Lingual ROM: Reduced left Lingual Symmetry: Abnormal symmetry left   Ice Chips Ice chips: Within functional limits   Thin Liquid Thin Liquid: Impaired Presentation: Spoon;Cup;Straw Oral Phase Impairments: Poor awareness of bolus;Reduced labial seal;Reduced lingual movement/coordination Oral Phase Functional Implications: Left anterior spillage Pharyngeal  Phase Impairments: Multiple swallows;Throat Clearing - Delayed;Throat Clearing - Immediate;Cough - Immediate;Cough - Delayed    Nectar Thick Nectar Thick Liquid: Impaired Presentation: Cup Pharyngeal Phase Impairments: Multiple swallows;Wet Vocal Quality;Cough - Delayed   Honey Thick Honey Thick Liquid: Not tested   Puree Puree: Impaired Presentation: Spoon Oral Phase Functional Implications: Prolonged oral transit;Oral residue;Oral holding Pharyngeal Phase Impairments: Multiple swallows;Wet Vocal Quality;Cough - Delayed   Solid     Solid: Impaired Presentation: Self Fed Oral Phase  Impairments: Poor awareness of bolus;Impaired mastication;Reduced lingual movement/coordination Oral Phase Functional Implications: Oral residue;Oral holding;Prolonged oral transit;Impaired mastication Pharyngeal Phase Impairments: Multiple swallows;Throat Clearing - Delayed     Marek Nghiem H. Roddie Mc, CCC-SLP Speech Language Pathologist  Wende Bushy 05/30/2021,9:30 AM

## 2021-05-30 NOTE — Plan of Care (Signed)
  Problem: Acute Rehab PT Goals(only PT should resolve) Goal: Pt Will Go Supine/Side To Sit Outcome: Progressing Flowsheets (Taken 05/30/2021 1238) Pt will go Supine/Side to Sit: with minimal assist Goal: Pt Will Go Sit To Supine/Side Outcome: Progressing Flowsheets (Taken 05/30/2021 1238) Pt will go Sit to Supine/Side: with minimal assist Goal: Patient Will Transfer Sit To/From Stand Outcome: Progressing Flowsheets (Taken 05/30/2021 1238) Patient will transfer sit to/from stand:  with moderate assist  with minimal assist Goal: Pt Will Transfer Bed To Chair/Chair To Bed Outcome: Progressing Flowsheets (Taken 05/30/2021 1238) Pt will Transfer Bed to Chair/Chair to Bed:  with min assist  with mod assist Goal: Pt Will Ambulate Outcome: Progressing Flowsheets (Taken 05/30/2021 1238) Pt will Ambulate:  25 feet  with least restrictive assistive device  with minimal assist  with moderate assist Goal: Pt/caregiver will Perform Home Exercise Program Outcome: Progressing Flowsheets (Taken 05/30/2021 1238) Pt/caregiver will Perform Home Exercise Program:  For increased strengthening  For improved balance  With Supervision, verbal cues required/provided  12:40 PM, 05/30/21 Mearl Latin PT, DPT Physical Therapist at [facility]

## 2021-05-30 NOTE — Progress Notes (Signed)
SLP Cancellation Note  Patient Details Name: Vickie Mckee MRN: [id number] DOB: [birth year]   Cancelled treatment:        Acknowledge SLE order; Primary need - dysphagia evaluated today via BSE and MBSS. Plan to complete SLE when schedule permits. Thank you,  Jaikob Borgwardt H. Roddie Mc, CCC-SLP Speech Language Pathologist    Wende Bushy 05/30/2021, 11:41 AM

## 2021-05-30 NOTE — Evaluation (Signed)
Physical Therapy Evaluation Patient Details Name: Vickie Mckee MRN: [id number] DOB: Feb 13, 1931 Today's Date: 05/30/2021   History of Present Illness  Vickie Mckee is a 87 y.o. female with medical history significant of essential hypertension, hypothyroidism, gastroesophageal reflux disease, depression, vitamin D deficiency, history of HOCM and chronic low back pain; who presented to the hospital secondary to left-sided weakness and dysarthria.  Patient is on clear about exact  Timing of initiation of her symptoms; she reported last felt normal after waking up this morning around 7 AM.  Patient reported ambulating as she normally does after waking up in the morning, and anywhere in between 8 and 9 she has no recollection of the events, family was not able to reach her and contacted EMS in the way to her house.  Patient was found on the floor demonstrating left-sided weakness, left lateralized gaze, vomiting content next to her and also dysarthria.  Patient denies chest pain, shortness of breath, abdominal pain, headaches, fever, chills, dysuria, hematuria, melena, hematochezia, sick contacts or any other complaints.  There is no history of urinary or fecal incontinence when patient was found.   Clinical Impression  Patient limited for functional mobility as stated below secondary to weakness, fatigue, hemiplegia, and impaired balance. Patient requiring mostly mod assist with bed mobility secondary to limited L sided function. Patient able to pull to seated with PT assist. Patient demonstrating impaired sitting balance initially with L sided lean requiring support which improves after about 10 minutes and patient able to maintain sitting balance without assist. Patient assisted back to supine at end of session with nursing and family present. Patient will benefit from continued physical therapy in hospital and recommended venue below to increase strength, balance, endurance for safe ADLs and gait.      Follow Up Recommendations CIR    Equipment Recommendations  None recommended by PT    Recommendations for Other Services       Precautions / Restrictions Precautions Precautions: Fall Restrictions Weight Bearing Restrictions: No      Mobility  Bed Mobility Overal bed mobility: Needs Assistance Bed Mobility: Supine to Sit;Sit to Supine;Rolling Rolling: Mod assist;Min assist   Supine to sit: Mod assist;HOB elevated Sit to supine: Mod assist   General bed mobility comments: assist for LE movement and to pull to seated to transition to seated EOB; assist for LE movement back into bed    Transfers                    Ambulation/Gait                Stairs            Wheelchair Mobility    Modified Rankin (Stroke Patients Only)       Balance Overall balance assessment: Needs assistance Sitting-balance support: Single extremity supported;Feet unsupported Sitting balance-Leahy Scale: Fair Sitting balance - Comments: fair/poor initially, after several minutes able to maintain sitting balance without PT assist Postural control: Left lateral lean                                   Pertinent Vitals/Pain Pain Assessment: No/denies pain    Home Living Family/patient expects to be discharged to:: Private residence Living Arrangements: Alone Available Help at Discharge: Family;Friend(s);Available PRN/intermittently Type of Home: House Home Access: Ramped entrance     Home Layout: Two level;Able to live on main level with bedroom/bathroom  Home Equipment: [place] - 2 wheels;Shower seat;Bedside commode      Prior Function Level of Independence: Independent with assistive device(s)         Comments: Patient and family state houshold ambulation with RW, independent with basic ADL, family and friends assist PRN     Hand Dominance        Extremity/Trunk Assessment   Upper Extremity Assessment Upper Extremity Assessment: Defer  to OT evaluation    Lower Extremity Assessment Lower Extremity Assessment: LLE deficits/detail LLE Deficits / Details: LLE flaccid with spontanous L ankle DF, no spasticity LLE; grossly tender and hypertonic L hip adductors likely due to strain with falling LLE Sensation: WNL       Communication   Communication: No difficulties  Cognition Arousal/Alertness: Awake/alert Behavior During Therapy: WFL for tasks assessed/performed Overall Cognitive Status: Within Functional Limits for tasks assessed                                        General Comments      Exercises     Assessment/Plan    PT Assessment Patient needs continued PT services  PT Problem List Decreased strength;Decreased mobility;Decreased coordination;Decreased activity tolerance;Decreased balance;Decreased knowledge of use of DME       PT Treatment Interventions DME instruction;Therapeutic exercise;Gait training;Balance training;Stair training;Neuromuscular re-education;Functional mobility training;Therapeutic activities;Patient/family education    PT Goals (Current goals can be found in the Care Plan section)  Acute Rehab PT Goals Patient Stated Goal: return home after getting more mobile PT Goal Formulation: With patient Time For Goal Achievement: 06/13/21 Potential to Achieve Goals: Fair    Frequency Min 3X/week   Barriers to discharge        Co-evaluation               AM-PAC PT "6 Clicks" Mobility  Outcome Measure Help needed turning from your back to your side while in a flat bed without using bedrails?: A Little Help needed moving from lying on your back to sitting on the side of a flat bed without using bedrails?: A Lot Help needed moving to and from a bed to a chair (including a wheelchair)?: A Lot Help needed standing up from a chair using your arms (e.g., wheelchair or bedside chair)?: A Lot Help needed to walk in hospital room?: A Lot Help needed climbing 3-5 steps  with a railing? : Total 6 Click Score: 12    End of Session   Activity Tolerance: Patient tolerated treatment well Patient left: in bed;with call bell/phone within reach;with nursing/sitter in room;with family/visitor present Nurse Communication: Mobility status PT Visit Diagnosis: Unsteadiness on feet (R26.81);Other abnormalities of gait and mobility (R26.89);Muscle weakness (generalized) (M62.81);Other symptoms and signs involving the nervous system (R29.898);Hemiplegia and hemiparesis Hemiplegia - Right/Left: Left Hemiplegia - caused by: Cerebral infarction    Time: [id number] PT Time Calculation (min) (ACUTE ONLY): 36 min   Charges:   PT Evaluation $PT Eval Moderate Complexity: 1 Mod PT Treatments $Therapeutic Activity: 23-37 mins       12:35 PM, 05/30/21 Mearl Latin PT, DPT Physical Therapist at [facility]

## 2021-05-30 NOTE — Progress Notes (Addendum)
Initial Nutrition Assessment  DOCUMENTATION CODES:      INTERVENTION:  Magic cup TID with meals, each supplement provides 290 kcal and 9 grams of protein   NUTRITION DIAGNOSIS:   Inadequate oral intake related to dysphagia as evidenced by Meal intake 0%.   GOAL:  Patient will meet greater than or equal to 90% of their needs   MONITOR:  Diet advancement, Labs, PO intake  REASON FOR ASSESSMENT:   Malnutrition Screening Tool    ASSESSMENT: Patient is a 86 yo female with GERD, HTN, Vitamin D deficiency, chronic low back pain.   Presents to ED with left sided weakness and dysarthria. Stroke work up in progress.   SLP following and recommending NPO at this time. Moderate aspiration risk.  Spoke with patient bedside. Nonsensical statements at times. Unable to obtain meaningful nutrition history at this point. Will continue to follow.  Pureed diet for lunch untouched. Nectar thick liquids.   Medications reviewed and include: Protonix, levothyroxine.   Weight history stable: 59-61 kg since December of 2015.  IVF- NS KCL @ 75 ml/hr.  Labs: Hypokalemia BMP Latest Ref Rng & Units 05/29/2021 05/29/2021 01/25/2015  Glucose 70 - 99 mg/dL 133(H) 137(H) 106(H)  BUN 8 - 23 mg/dL 18 18 25(H)  Creatinine 0.44 - 1.00 mg/dL 0.70 0.90 1.24(H)  Sodium 135 - 145 mmol/L 141 136 137  Potassium 3.5 - 5.1 mmol/L 2.6(LL) 2.6(LL) 3.4(L)  Chloride 98 - 111 mmol/L 100 100 104  CO2 22 - 32 mmol/L - 23 25  Calcium 8.9 - 10.3 mg/dL - 8.9 9.1      NUTRITION - FOCUSED PHYSICAL EXAM: Nutrition-Focused physical exam completed. Findings are moderate upper arm fat depletion, moderate clavicle muscle depletion.     Diet Order:   Diet Order             DIET - DYS 1 Room service appropriate? Yes; Fluid consistency: Nectar Thick  Diet effective now                   EDUCATION NEEDS:  Not appropriate for education at this time  Skin:  Skin Assessment: Reviewed RN Assessment  Last BM:   Unknown  Height:   Ht Readings from Last 1 Encounters:  05/29/21 '5\' 2"'$  (1.575 m)    Weight:   Wt Readings from Last 1 Encounters:  05/29/21 60.4 kg    Ideal Body Weight:   50 kg  BMI:  Body mass index is 24.34 kg/m.  Estimated Nutritional Needs:   Kcal:  1600-1700  Protein:  78-85  Fluid:  >1500 ml   Colman Cater MS,RD,CSG,LDN Contact: AMION

## 2021-05-30 NOTE — Progress Notes (Signed)
PROGRESS NOTE    Vickie Mckee  [id number] DOB: June 06, 1931 DOA: 05/29/2021 PCP: Seward Carol, MD   Chief Complaint  Patient presents with   Code Stroke    Brief admission narrative:  Vickie Mckee is a 87 y.o. female with medical history significant of essential hypertension, hypothyroidism, gastroesophageal reflux disease, depression, vitamin D deficiency, history of HOCM and chronic low back pain; who presented to the hospital secondary to left-sided weakness and dysarthria.  Patient is on clear about exact Timing of initiation of her symptoms; she reported last felt normal after waking up this morning around 7 AM.  Patient reported ambulating as she normally does after waking up in the morning, and anywhere in between 8 and 9 she has no recollection of the events, family was not able to reach her and contacted EMS in the way to her house.  Patient was found on the floor demonstrating left-sided weakness, left lateralized gaze, vomiting content next to her and also dysarthria.  Patient denies chest pain, shortness of breath, abdominal pain, headaches, fever, chills, dysuria, hematuria, melena, hematochezia, sick contacts or any other complaints.  There is no history of urinary or fecal incontinence when patient was found.   Of note, there is no records of COVID vaccination; PCR in the ED was negative.   ED Course: Patient with strokelike symptoms demonstrating dysarthria, left-sided weakness with a muscle strength of 3 out of 5; positive hypokalemia, and negative CT head without contrast.  Case was discussed with neurology service who felt patient was out of window for tPA; was recommended for patient to be admitted to [facility] to complete stroke work-up.  Assessment & Plan: 1-acute right pontine ischemic stroke -Speech therapy has seen patient and recommendation for dysphagia 1 diet in place -Physical therapy recommending CIR -No large vessels occlusion  appreciated -Neurology evaluation pending -EEG demonstrating no epileptiform waves. -Secondary prevention using aspirin and Plavix. -Continue risk factor modification and allow permissive hypertension.  2-hypertension -Continue as needed hydralazine -Allowing permissive hypertension.  3-hypothyroidism -Continue Synthroid  4-mild dementia/depression and anxiety -Continue Aricept. -Continue sertraline  5-gastroesophageal flux disease -Continue PPI  6-hypokalemia -Electrolytes has been repleted -Will follow trend  7-dysphagia: Secondary to residual effects from acute stroke -Will follow speech therapy recommendation -Currently dysphagia 1 diet with medications given as a whole inside applesauce -Patient is status post MBS   DVT prophylaxis: Lovenox Code Status: Full code Family Communication: Daughter is at bedside. Disposition:   Status is: Inpatient  Remains inpatient appropriate because:IV treatments appropriate due to intensity of illness or inability to take PO  Dispo: The patient is from: Home              Anticipated d/c is to: CIR              Patient currently is not medically stable to d/c.   Difficult to place patient No       Consultants:  Neurology service  Procedures:  See below for x-ray reports.  Antimicrobials:  None   Subjective: Complaining of left-sided weakness, difficulty finding words and dysarthria.  No chest pain, no abdominal pain, no nausea or vomiting.  Denies dysuria.  Objective: Vitals:   05/30/21 0901 05/30/21 0928 05/30/21 1242 05/30/21 1744  BP: 138/77 (!) 158/74 (!) 144/66 (!) 154/77  Pulse: 83 79 82 78  Resp: '17 20 20 20  '$ Temp: 98.2 F (36.8 C) 98.5 F (36.9 C) 97.6 F (36.4 C) 98.1 F (36.7 C)  TempSrc: Oral  Oral Oral  SpO2: 98% 97% 94% 97%  Weight:      Height:        Intake/Output Summary (Last 24 hours) at 05/30/2021 1812 Last data filed at 05/30/2021 1200 Gross per 24 hour  Intake 0 ml  Output --  Net  0 ml   Filed Weights   05/29/21 1358  Weight: 60.4 kg    Examination:  General exam: Afebrile, no chest pain, no nausea, no vomiting.  Still with dysarthria, mild difficulty finding words, repeating herself at times and with complaints of left-sided weakness. Respiratory system: Good air movement bilaterally; no wheezing or crackles. Cardiovascular system: S1 & S2 heard, RRR.  No JVD, no rubs, no gallops. Gastrointestinal system: Abdomen is nondistended, soft and nontender. No organomegaly or masses felt. Normal bowel sounds heard. Central nervous system: Alert and oriented.  Left-sided weakness (muscle strength 3 out of 5), positive dysarthria and difficulty finding words. Extremities: No cyanosis or clubbing. Skin: No petechiae. Psychiatry: Mood & affect appropriate.     Data Reviewed: I have personally reviewed following labs and imaging studies  CBC: Recent Labs  Lab 05/29/21 1327 05/29/21 1401  WBC 21.3*  --   NEUTROABS 18.7*  --   HGB 15.6* 15.6*  HCT 45.5 46.0  MCV 87.2  --   PLT 317  --     Basic Metabolic Panel: Recent Labs  Lab 05/29/21 1327 05/29/21 1328 05/29/21 1401  NA 136  --  141  K 2.6*  --  2.6*  CL 100  --  100  CO2 23  --   --   GLUCOSE 137*  --  133*  BUN 18  --  18  CREATININE 0.90  --  0.70  CALCIUM 8.9  --   --   MG  --  1.8  --     GFR: Estimated Creatinine Clearance: 42.4 mL/min (by C-G formula based on SCr of 0.7 mg/dL).  Liver Function Tests: Recent Labs  Lab 05/29/21 1327  AST 47*  ALT 20  ALKPHOS 37*  BILITOT 1.2  PROT 7.4  ALBUMIN 4.2    CBG: Recent Labs  Lab 05/29/21 1343 05/30/21 0754 05/30/21 1154 05/30/21 1709  GLUCAP 141* 116* 138* 111*     Recent Results (from the past 240 hour(s))  Resp Panel by RT-PCR (Flu A&B, Covid) Nasopharyngeal Swab     Status: None   Collection Time: 05/29/21  1:40 PM   Specimen: Nasopharyngeal Swab; Nasopharyngeal(NP) swabs in vial transport medium  Result Value Ref Range  Status   SARS Coronavirus 2 by RT PCR NEGATIVE NEGATIVE Final    Comment: (NOTE) SARS-CoV-2 target nucleic acids are NOT DETECTED.  The SARS-CoV-2 RNA is generally detectable in upper respiratory specimens during the acute phase of infection. The lowest concentration of SARS-CoV-2 viral copies this assay can detect is 138 copies/mL. A negative result does not preclude SARS-Cov-2 infection and should not be used as the sole basis for treatment or other patient management decisions. A negative result may occur with  improper specimen collection/handling, submission of specimen other than nasopharyngeal swab, presence of viral mutation(s) within the areas targeted by this assay, and inadequate number of viral copies(<138 copies/mL). A negative result must be combined with clinical observations, patient history, and epidemiological information. The expected result is Negative.  Fact Sheet for Patients:  EntrepreneurPulse.com.au  Fact Sheet for Healthcare Providers:  IncredibleEmployment.be  This test is no t yet approved or cleared by the [place] and  has been authorized for detection and/or diagnosis of SARS-CoV-2 by FDA under an Emergency Use Authorization (EUA). This EUA will remain  in effect (meaning this test can be used) for the duration of the COVID-19 declaration under Section 564(b)(1) of the Act, 21 U.S.C.section 360bbb-3(b)(1), unless the authorization is terminated  or revoked sooner.       Influenza A by PCR NEGATIVE NEGATIVE Final   Influenza B by PCR NEGATIVE NEGATIVE Final    Comment: (NOTE) The Xpert Xpress SARS-CoV-2/FLU/RSV plus assay is intended as an aid in the diagnosis of influenza from Nasopharyngeal swab specimens and should not be used as a sole basis for treatment. Nasal washings and aspirates are unacceptable for Xpert Xpress SARS-CoV-2/FLU/RSV testing.  Fact Sheet for  Patients: EntrepreneurPulse.com.au  Fact Sheet for Healthcare Providers: IncredibleEmployment.be  This test is not yet approved or cleared by the [place] FDA and has been authorized for detection and/or diagnosis of SARS-CoV-2 by FDA under an Emergency Use Authorization (EUA). This EUA will remain in effect (meaning this test can be used) for the duration of the COVID-19 declaration under Section 564(b)(1) of the Act, 21 U.S.C. section 360bbb-3(b)(1), unless the authorization is terminated or revoked.  Performed at [facility], [address] [place], [place] [address]      Radiology Studies: CT ANGIO NECK W OR WO CONTRAST  Result Date: 05/29/2021 CLINICAL DATA:  Neuro deficit, acute, stroke suspected. Additional history provided: Left-sided weakness onset 1 hour prior to arrival. EXAM: CT ANGIOGRAPHY HEAD AND NECK TECHNIQUE: Multidetector CT imaging of the head and neck was performed using the standard protocol during bolus administration of intravenous contrast. Multiplanar CT image reconstructions and MIPs were obtained to evaluate the vascular anatomy. Carotid stenosis measurements (when applicable) are obtained utilizing NASCET criteria, using the distal internal carotid diameter as the denominator. CONTRAST:  81m OMNIPAQUE IOHEXOL 350 MG/ML SOLN COMPARISON:  Noncontrast head CT performed earlier today 05/29/2021. FINDINGS: CTA NECK FINDINGS Aortic arch: Common origin of the innominate and left common carotid arteries. The left vertebral artery arises directly from the aortic arch. Atherosclerotic plaque within the visualized aortic arch and proximal major branch vessels of the neck. No hemodynamically significant innominate or proximal subclavian artery stenosis. Right carotid system: CCA and ICA patent within the neck without stenosis mild calcified plaque within the carotid bifurcation. Left carotid system: CCA and ICA patent within the neck  without stenosis. Minimal calcified plaque within the carotid bifurcation. Nonspecific fusiform dilation of the distal cervical ICA to 7 mm. Vertebral arteries: Patent within the neck. The right vertebral artery is dominant. Mild to moderate stenosis within the distal V2 left vertebral artery. No other significant cervical vertebral artery stenosis is identified. Skeleton: Cervical spondylosis. C3-C4 and C4-C5 grade 1 anterolisthesis. No acute bony abnormality or aggressive osseous lesion. Other neck: No neck mass or cervical lymphadenopathy. Upper chest: No consolidation within the imaged lung apices. Review of the MIP images confirms the above findings CTA HEAD FINDINGS Anterior circulation: The intracranial internal carotid arteries are patent. Nonstenotic calcified plaque within both vessels. The M1 middle cerebral arteries are patent. No M2 proximal branch occlusion or high-grade proximal stenosis is identified. The anterior cerebral arteries are patent. The A1 left anterior cerebral artery is hypoplastic. 1 mm posteriorly projecting vascular protrusion arising from the region of the anterior communicating artery likely reflecting an aneurysm (series 6, image 99). 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 At least  one branch vessel appears to arise from this aneurysm Posterior circulation: The non dominant intracranial left vertebral artery is developmentally diminutive, but patent. The intracranial right vertebral artery is patent without significant stenosis. Developmentally diminutive basilar artery with superimposed mild atherosclerotic irregularity and stenoses. Hypoplastic P1 segments bilaterally with sizable bilateral posterior communicating arteries. The posterior cerebral arteries are patent without high-grade proximal stenosis. Venous sinuses: Within the limitations of contrast timing, no convincing thrombus. Anatomic variants: As  described Review of the MIP images confirms the above findings No intracranial large vessel occlusion identified. These results were called by telephone at the time of interpretation on 05/29/2021 at 2:25pm to provider Vickie Mckee [facility] , who verbally acknowledged these results. IMPRESSION: CTA neck: 1. The common carotid and internal carotid arteries are patent within the neck without stenosis. Mild atherosclerotic plaque within both carotid systems within the neck, as described. 2. Vertebral arteries patent within the neck. Mild-to-moderate stenosis within the distal V2 segment of the non-dominant left vertebral artery. 3. Mild nonspecific fusiform dilation of the distal cervical left ICA to 7 mm. CTA head: 1. No intracranial large vessel occlusion or proximal high-grade arterial stenosis identified. 2. Intracranial atherosclerotic disease, as described. 3. 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 At least one branch vessel arises from this aneurysm. 4. 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 Electronically Signed   By: Kellie Simmering DO   On: 05/29/2021 14:53   MR BRAIN WO CONTRAST  Result Date: 05/29/2021 CLINICAL DATA:  Neuro deficit, acute, stroke suspected EXAM: MRI HEAD WITHOUT CONTRAST TECHNIQUE: Multiplanar, multiecho pulse sequences of the brain and surrounding structures were obtained without intravenous contrast. COMPARISON:  Same day CT head. FINDINGS: Motion limited study.  Within this limitation: Brain: Acute right pontine infarct. Mild associated edema without mass effect. Mild for age scattered T2 hyperintensities in the white matter, nonspecific but likely related to chronic microvascular ischemic disease. Multiple small remote bilateral cerebellar lacunar infarcts. Moderate atrophy with prominence of the extra-axial spaces. Bilateral choroid plexus xanthogranulomas, which restrict diffusion. No hydrocephalus. No midline shift. No  evidence of acute hemorrhage. Vascular: Further evaluated on same day CTA. Skull and upper cervical spine: Normal marrow signal. Sinuses/Orbits: Mild ethmoid air cell mucosal thickening. Small volume of frothy secretions in the right sphenoid sinus. No acute orbital findings. Other: No sizable mastoid effusions. IMPRESSION: 1. Acute right pontine infarct. Mild associated edema without mass effect. 2. Multiple small remote bilateral cerebellar lacunar infarcts. 3. Mild for age chronic microvascular ischemic disease and moderate atrophy. Electronically Signed   By: Margaretha Sheffield MD   On: 05/29/2021 17:51   DG Swallowing Func-Speech Pathology  Result Date: 05/30/2021 Formatting of this result is different from the original. Objective Swallowing Evaluation: Type of Study: MBS-Modified Barium Swallow Study  Patient Details Name: Vickie Mckee MRN: [id number] Date of Birth: 12-03-1930 Today's Date: 05/30/2021 Time: SLP Start Time (ACUTE ONLY): 1015 -SLP Stop Time (ACUTE ONLY): [id number] SLP Time Calculation (min) (ACUTE ONLY): 21 min Past Medical History: Past Medical History: Diagnosis Date  Decreased sense of smell   and taste-negative CT scan-2011  Depression   Essential hypertension   GERD (gastroesophageal reflux disease)   Heart murmur   heard first time 4/12  History of mammogram   2010 and she does not want to repeat them anymore  HOCM (hypertrophic obstructive cardiomyopathy) ([place])   a. Dx by echo 05/2014.  Hypothyroidism   IBS (irritable bowel syndrome)  Impaired fasting glucose   Low back pain   Lumbar radiculopathy   with negative MRI (1991)  Postmenopausal   with osteopenia, bisphosphonates 1/05-1/11, improved osteopenia 4/12-repeat planned late 2015  Shingles   2015  Thyroid nodule   Vitamin D deficiency   repleted on weekly vitamin D 50000 iu Past Surgical History: Past Surgical History: Procedure Laterality Date  ABDOMINAL HYSTERECTOMY    BREAST BIOPSY    x2  CATARACT EXTRACTION    removal with IOL  bilateral  GLAUCOMA SURGERY    laser  skin cancer removal    THYROIDECTOMY    subtotal HPI: Vickie Mckee is a 87 y.o. female with medical history significant of essential hypertension, hypothyroidism, gastroesophageal reflux disease, depression, vitamin D deficiency, history of HOCM and chronic low back pain; who presented to the hospital secondary to left-sided weakness and dysarthria. Patient was found on the floor demonstrating left-sided weakness, left lateralized gaze, vomiting content next to her and also dysarthria. MRI reveals "Acute right pontine infarct. Mild associated edema without mass effect. Multiple small remote bilateral cerebellar lacunar infarcts." Pt failed [place] stroke swallow, BSE requested.  No data recorded Assessment / Plan / Recommendation CHL IP CLINICAL IMPRESSIONS 05/30/2021 Clinical Impression Pt presents with mild/moderate sensorimotor oropharyngeal dysphagia characterized by silent and sensed aspiration of thin liquids. Pt demonstrates decreased laryngeal vestibule closure with cup sips of thin liquid resulting in penetration to the cords during the swallow that is not sensed and trace amounts typically fall below the cords after the swallow (aspiration is sensed ~50% of the time). Decreased penetration was noted with straw sips of thin liquids, however, trace amounts still fall to the cords and were likely eventually aspirated. Chin tuck was very effective in minimizing/eliminating penetration during the swallow, which also minimized/eliminated aspiration after the swallow. Minimal/occasional flash/shallow penetration visualized with NTL trials with NO aspiration. Puree textures were consumed withouth incident, note as multiple trials went on Pt with mild valleculae residue required a verbal cue for repeat dry swallow. With regular textures Pt demonstrated prolonged oral prep stage, reporting biting her L cheeck during mastication, poor awareness of bolus at times (resulting in  holding) and mild/mod oral residue after the swallow. Pt swallowed barium tablet without incident. Recommend initiate conservative diet of D1/puree and NECTAR thick liquids with initiation of skilled ST to implement strategies for oral dysphagia and trials of thin liquids with chin tuck -- further, permit free water (water only after oral care). Despite the fact that chin tuck was effective in minimizing/eliminating penetration/aspiration during the swallow Pt is still at high risk for aspiration with thin liquids secondary to changes in mentation post this acute CVA resulting in talking during PO despite cues not to, suspected memory deficit (?ability to consistently utilize chin tuck), and mild pharyngeal residue post swallow could still be silently aspirated. Puree is recommended secondary to poor oral control and awareness of new decreased labial/lingual ROM and strength. Pt has good potential for diet upgrade to thin/soft textures with ST therapy and as awareness and mentation hopefully improve. Above reviewed with Pt and Pt's daughters. SLP Visit Diagnosis Dysphagia, oropharyngeal phase (R13.12) Attention and concentration deficit following -- Frontal lobe and executive function deficit following -- Impact on safety and function Mild aspiration risk;Moderate aspiration risk   CHL IP TREATMENT RECOMMENDATION 05/30/2021 Treatment Recommendations Therapy as outlined in treatment plan below   Prognosis 05/30/2021 Prognosis for Safe Diet Advancement Good Barriers to Reach Goals Cognitive deficits Barriers/Prognosis Comment -- CHL IP DIET RECOMMENDATION  05/30/2021 SLP Diet Recommendations Dysphagia 1 (Puree) solids;Nectar thick liquid Liquid Administration via Cup Medication Administration Whole meds with liquid Compensations Minimize environmental distractions;Slow rate;Small sips/bites Postural Changes Remain semi-upright after after feeds/meals (Comment);Seated upright at 90 degrees   CHL IP OTHER RECOMMENDATIONS  05/30/2021 Recommended Consults -- Oral Care Recommendations Oral care BID Other Recommendations Remove water pitcher;Order thickener from pharmacy;Prohibited food (jello, ice cream, thin soups)   CHL IP FOLLOW UP RECOMMENDATIONS 05/30/2021 Follow up Recommendations Skilled Nursing facility;24 hour supervision/assistance   CHL IP FREQUENCY AND DURATION 05/30/2021 Speech Therapy Frequency (ACUTE ONLY) min 2x/week Treatment Duration 1 week      CHL IP ORAL PHASE 05/30/2021 Oral Phase Impaired Oral - Pudding Teaspoon -- Oral - Pudding Cup -- Oral - Honey Teaspoon -- Oral - Honey Cup -- Oral - Nectar Teaspoon NT Oral - Nectar Cup Decreased bolus cohesion;Lingual pumping Oral - Nectar Straw NT Oral - Thin Teaspoon Premature spillage;Decreased bolus cohesion;Piecemeal swallowing;Lingual/palatal residue;Lingual pumping;Weak lingual manipulation Oral - Thin Cup Premature spillage;Decreased bolus cohesion;Piecemeal swallowing;Lingual/palatal residue;Lingual pumping;Weak lingual manipulation Oral - Thin Straw Premature spillage;Decreased bolus cohesion;Piecemeal swallowing;Lingual/palatal residue;Lingual pumping;Weak lingual manipulation Oral - Puree Decreased bolus cohesion;Delayed oral transit;Lingual pumping;Weak lingual manipulation;Lingual/palatal residue;Piecemeal swallowing Oral - Mech Soft NT Oral - Regular Decreased bolus cohesion;Delayed oral transit;Lingual pumping;Weak lingual manipulation;Lingual/palatal residue;Piecemeal swallowing Oral - Multi-Consistency NT Oral - Pill WFL Oral Phase - Comment --  CHL IP PHARYNGEAL PHASE 05/30/2021 Pharyngeal Phase Impaired Pharyngeal- Pudding Teaspoon -- Pharyngeal -- Pharyngeal- Pudding Cup -- Pharyngeal -- Pharyngeal- Honey Teaspoon -- Pharyngeal -- Pharyngeal- Honey Cup -- Pharyngeal -- Pharyngeal- Nectar Teaspoon NT Pharyngeal -- Pharyngeal- Nectar Cup Penetration/Aspiration during swallow;Pharyngeal residue - pyriform;Pharyngeal residue - valleculae Pharyngeal Material enters  airway, remains ABOVE vocal cords then ejected out Pharyngeal- Nectar Straw NT Pharyngeal -- Pharyngeal- Thin Teaspoon Delayed swallow initiation-pyriform sinuses;Penetration/Aspiration during swallow;Trace aspiration;Pharyngeal residue - valleculae;Pharyngeal residue - pyriform Pharyngeal Material enters airway, remains ABOVE vocal cords then ejected out Pharyngeal- Thin Cup Delayed swallow initiation-pyriform sinuses;Penetration/Aspiration during swallow;Trace aspiration;Pharyngeal residue - valleculae;Pharyngeal residue - pyriform Pharyngeal Material enters airway, passes BELOW cords and not ejected out despite cough attempt by patient Pharyngeal- Thin Straw Delayed swallow initiation-pyriform sinuses;Penetration/Aspiration during swallow;Pharyngeal residue - valleculae;Pharyngeal residue - pyriform Pharyngeal Material enters airway, remains ABOVE vocal cords then ejected out Pharyngeal- Puree Pharyngeal residue - valleculae Pharyngeal -- Pharyngeal- Mechanical Soft NT Pharyngeal -- Pharyngeal- Regular Pharyngeal residue - valleculae Pharyngeal -- Pharyngeal- Multi-consistency NT Pharyngeal -- Pharyngeal- Pill WFL Pharyngeal -- Pharyngeal Comment --  CHL IP CERVICAL ESOPHAGEAL PHASE 05/30/2021 Cervical Esophageal Phase WFL Pudding Teaspoon -- Pudding Cup -- Honey Teaspoon -- Honey Cup -- Nectar Teaspoon -- Nectar Cup -- Nectar Straw -- Thin Teaspoon -- Thin Cup -- Thin Straw -- Puree -- Mechanical Soft -- Regular -- Multi-consistency -- Pill -- Cervical Esophageal Comment -- Amelia H. Roddie Mc, CCC-SLP Speech Language Pathologist Wende Bushy 05/30/2021, 11:41 AM              EEG adult  Result Date: 05/30/2021 Lora Havens, MD     05/30/2021  5:27 PM Patient Name: Vickie Mckee MRN: [id number] Epilepsy Attending: Lora Havens Referring Physician/Provider: Dr Barton Dubois Date: 05/30/2021 Duration: 24.27 mins Patient history: 87yo F with left sided weakness and dysarthria. EEG to evaluate for  seizure. Level of alertness: Awake, asleep AEDs during EEG study: Technical aspects: This EEG study was done with scalp electrodes positioned according to the 10-20 International system of electrode placement. Electrical activity was acquired at  a sampling rate of '500Hz'$  and reviewed with a high frequency filter of '70Hz'$  and a low frequency filter of '1Hz'$ . EEG data were recorded continuously and digitally stored. Description: The posterior dominant rhythm consists of 6 Hz activity of moderate voltage (25-35 uV) seen predominantly in posterior head regions, symmetric and reactive to eye opening and eye closing. Sleep was characterized by vertex waves, sleep spindles (12 to 14 Hz), maximal frontocentral region. Hyperventilation and photic stimulation were not performed.   ABNORMALITY - Background slow IMPRESSION: This study is suggestive of mild diffuse encephalopathy, nonspecific etiology. No seizures or epileptiform discharges were seen throughout the recording. Lora Havens   ECHOCARDIOGRAM COMPLETE  Result Date: 05/30/2021    ECHOCARDIOGRAM REPORT   Patient Name:   Vickie Mckee Date of Exam: 05/29/2021 Medical Rec #:  [id number]       Height:       62.0 in Accession #:    [id number]      Weight:       133.1 lb Date of Birth:  [birth year]        BSA:          1.608 m Patient Age:    33 years        BP:           163/70 mmHg Patient Gender: F               HR:           89 bpm. Exam Location:  [facility] Procedure: 2D Echo, Cardiac Doppler and Color Doppler Indications:    Stroke I63.9  History:        Patient has prior history of Echocardiogram examinations, most                 recent 06/16/2014. Signs/Symptoms:Murmur; Risk                 Factors:Hypertension. HOCM (hypertrophic obstructive                 cardiomyopathy), GERD.  Sonographer:    Wenda Low Referring Phys: [place]  Sonographer Comments: Patient is unable to perform Valsalva and breathing instructions due to AMS. IMPRESSIONS  1.  No SAM noted mid/apical cavity obliteration in systole with some flow acceleration peak velocity in 87msec range . Left ventricular ejection fraction, by estimation, is 60 to 65%. The left ventricle has normal function. The left ventricle has no regional wall motion abnormalities. There is severe left ventricular hypertrophy. Left ventricular diastolic parameters are consistent with Grade I diastolic dysfunction (impaired relaxation).  2. Right ventricular systolic function is normal. The right ventricular size is normal.  3. The mitral valve is normal in structure. No evidence of mitral valve regurgitation. No evidence of mitral stenosis.  4. The aortic valve is normal in structure. Aortic valve regurgitation is not visualized. No aortic stenosis is present.  5. The inferior vena cava is normal in size with greater than 50% respiratory variability, suggesting right atrial pressure of 3 mmHg. FINDINGS  Left Ventricle: No SAM noted mid/apical cavity obliteration in systole with some flow acceleration peak velocity in 266mec range. Left ventricular ejection fraction, by estimation, is 60 to 65%. The left ventricle has normal function. The left ventricle  has no regional wall motion abnormalities. The left ventricular internal cavity size was small. There is severe left ventricular hypertrophy. Left ventricular diastolic parameters are consistent with Grade I diastolic dysfunction (impaired relaxation). Right Ventricle: The right  ventricular size is normal. No increase in right ventricular wall thickness. Right ventricular systolic function is normal. Left Atrium: Left atrial size was normal in size. Right Atrium: Right atrial size was normal in size. Pericardium: There is no evidence of pericardial effusion. Mitral Valve: The mitral valve is normal in structure. No evidence of mitral valve regurgitation. No evidence of mitral valve stenosis. MV peak gradient, 16.6 mmHg. The mean mitral valve gradient is 4.0 mmHg.  Tricuspid Valve: The tricuspid valve is normal in structure. Tricuspid valve regurgitation is not demonstrated. No evidence of tricuspid stenosis. Aortic Valve: The aortic valve is normal in structure. Aortic valve regurgitation is not visualized. No aortic stenosis is present. Aortic valve mean gradient measures 4.0 mmHg. Aortic valve peak gradient measures 8.4 mmHg. Pulmonic Valve: The pulmonic valve was normal in structure. Pulmonic valve regurgitation is not visualized. No evidence of pulmonic stenosis. Aorta: The aortic root is normal in size and structure. Venous: The inferior vena cava is normal in size with greater than 50% respiratory variability, suggesting right atrial pressure of 3 mmHg. IAS/Shunts: No atrial level shunt detected by color flow Doppler.  LEFT VENTRICLE PLAX 2D LVIDd:         2.79 cm Diastology LVIDs:         1.51 cm LV e' medial:    8.59 cm/s LV PW:         0.97 cm LV E/e' medial:  6.0 LV IVS:        1.78 cm LV e' lateral:   6.31 cm/s                        LV E/e' lateral: 8.1  LEFT ATRIUM             Index LA diam:        2.90 cm 1.80 cm/m LA Vol (A2C):   18.7 ml 11.63 ml/m LA Vol (A4C):   23.8 ml 14.80 ml/m LA Biplane Vol: 24.9 ml 15.49 ml/m  AORTIC VALVE AV Vmax:           145.00 cm/s AV Vmean:          98.300 cm/s AV VTI:            0.272 m AV Peak Grad:      8.4 mmHg AV Mean Grad:      4.0 mmHg LVOT Vmax:         120.00 cm/s LVOT Vmean:        86.300 cm/s LVOT VTI:          0.251 m LVOT/AV VTI ratio: 0.92 MITRAL VALVE MV Area (PHT): 6.17 cm     SHUNTS MV Peak grad:  16.6 mmHg    Systemic VTI: 0.25 m MV Mean grad:  4.0 mmHg MV Vmax:       2.04 m/s MV Vmean:      92.0 cm/s MV Decel Time: 123 msec MV E velocity: 51.40 cm/s MV A velocity: 107.00 cm/s MV E/A ratio:  0.48 Jenkins Rouge MD Electronically signed by Jenkins Rouge MD Signature Date/Time: 05/30/2021/8:31:34 AM    Final    CT HEAD CODE STROKE WO CONTRAST  Result Date: 05/29/2021 CLINICAL DATA:  Code stroke. Neuro deficit,  acute, stroke suspected. Additional history provided: Left-sided weakness, last known well 1 hour prior to arrival. EXAM: CT HEAD WITHOUT CONTRAST TECHNIQUE: Contiguous axial images were obtained from the base of the skull through the vertex without intravenous contrast. COMPARISON:  Head CT 11/03/2020. FINDINGS: Brain: Mild generalized cerebral atrophy. There is no acute intracranial hemorrhage. No demarcated cortical infarct. No extra-axial fluid collection. No evidence of an intracranial mass. No midline shift. Vascular: No hyperdense vessel.  Atherosclerotic calcifications Skull: Normal. Negative for fracture or focal lesion. Sinuses/Orbits: Leftward gaze. Mild mucosal thickening within the left ethmoid air cells. Small volume frothy secretions within the right sphenoid sinus. ASPECTS ([place] Stroke Program Early CT Score) - Ganglionic level infarction (caudate, lentiform nuclei, internal capsule, insula, M1-M3 cortex): 7 - Supraganglionic infarction (M4-M6 cortex): 3 Total score (0-10 with 10 being normal): 10 These results were called by telephone at the time of interpretation on 05/29/2021 at 1:38 pm to provider [facility] , who verbally acknowledged these results. IMPRESSION: No evidence of acute intracranial abnormality. ASPECTS is 10. Mild generalized cerebral atrophy. Paranasal sinus disease at the imaged levels, as described. Electronically Signed   By: Kellie Simmering DO   On: 05/29/2021 13:40   CT ANGIO HEAD CODE STROKE  Result Date: 05/29/2021 CLINICAL DATA:  Neuro deficit, acute, stroke suspected. Additional history provided: Left-sided weakness onset 1 hour prior to arrival. EXAM: CT ANGIOGRAPHY HEAD AND NECK TECHNIQUE: Multidetector CT imaging of the head and neck was performed using the standard protocol during bolus administration of intravenous contrast. Multiplanar CT image reconstructions and MIPs were obtained to evaluate the vascular anatomy. Carotid stenosis measurements (when applicable)  are obtained utilizing NASCET criteria, using the distal internal carotid diameter as the denominator. CONTRAST:  59m OMNIPAQUE IOHEXOL 350 MG/ML SOLN COMPARISON:  Noncontrast head CT performed earlier today 05/29/2021. FINDINGS: CTA NECK FINDINGS Aortic arch: Common origin of the innominate and left common carotid arteries. The left vertebral artery arises directly from the aortic arch. Atherosclerotic plaque within the visualized aortic arch and proximal major branch vessels of the neck. No hemodynamically significant innominate or proximal subclavian artery stenosis. Right carotid system: CCA and ICA patent within the neck without stenosis mild calcified plaque within the carotid bifurcation. Left carotid system: CCA and ICA patent within the neck without stenosis. Minimal calcified plaque within the carotid bifurcation. Nonspecific fusiform dilation of the distal cervical ICA to 7 mm. Vertebral arteries: Patent within the neck. The right vertebral artery is dominant. Mild to moderate stenosis within the distal V2 left vertebral artery. No other significant cervical vertebral artery stenosis is identified. Skeleton: Cervical spondylosis. C3-C4 and C4-C5 grade 1 anterolisthesis. No acute bony abnormality or aggressive osseous lesion. Other neck: No neck mass or cervical lymphadenopathy. Upper chest: No consolidation within the imaged lung apices. Review of the MIP images confirms the above findings CTA HEAD FINDINGS Anterior circulation: The intracranial internal carotid arteries are patent. Nonstenotic calcified plaque within both vessels. The M1 middle cerebral arteries are patent. No M2 proximal branch occlusion or high-grade proximal stenosis is identified. The anterior cerebral arteries are patent. The A1 left anterior cerebral artery is hypoplastic. 1 mm posteriorly projecting vascular protrusion arising from the region of the anterior communicating artery likely reflecting an aneurysm (series 6, image 99).  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 At least one branch vessel appears to arise from this aneurysm Posterior circulation: The non dominant intracranial left vertebral artery is developmentally diminutive, but patent. The intracranial right vertebral artery is patent without significant stenosis. Developmentally diminutive basilar artery with superimposed mild atherosclerotic irregularity and stenoses. Hypoplastic P1 segments bilaterally with sizable bilateral posterior communicating arteries. The posterior cerebral arteries are  patent without high-grade proximal stenosis. Venous sinuses: Within the limitations of contrast timing, no convincing thrombus. Anatomic variants: As described Review of the MIP images confirms the above findings No intracranial large vessel occlusion identified. These results were called by telephone at the time of interpretation on 05/29/2021 at 2:25pm to provider Vickie Mckee [facility] , who verbally acknowledged these results. IMPRESSION: CTA neck: 1. The common carotid and internal carotid arteries are patent within the neck without stenosis. Mild atherosclerotic plaque within both carotid systems within the neck, as described. 2. Vertebral arteries patent within the neck. Mild-to-moderate stenosis within the distal V2 segment of the non-dominant left vertebral artery. 3. Mild nonspecific fusiform dilation of the distal cervical left ICA to 7 mm. CTA head: 1. No intracranial large vessel occlusion or proximal high-grade arterial stenosis identified. 2. Intracranial atherosclerotic disease, as described. 3. 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 At least one branch vessel arises from this aneurysm. 4. 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 Electronically Signed   By: Kellie Simmering DO   On: 05/29/2021 14:53     Scheduled Meds:  vitamin C  500 mg Oral Daily    [START ON 05/31/2021] aspirin  325 mg Oral Daily   clopidogrel  75 mg Oral Daily   donepezil  5 mg Oral QHS   enoxaparin (LOVENOX) injection  40 mg Subcutaneous Q24H   [START ON 05/31/2021] levothyroxine  75 mcg Oral QAC breakfast   pantoprazole  40 mg Oral Daily   sertraline  100 mg Oral Daily   zinc sulfate  220 mg Oral Daily   Continuous Infusions:  0.9 % NaCl with KCl 40 mEq / L 50 mL/hr at 05/30/21 1803   methocarbamol (ROBAXIN) IV 500 mg (05/30/21 1206)     LOS: 1 day    Time spent: 35 minutes.   Barton Dubois, MD [facility]   To contact the attending provider between 7A-7P or the covering provider during after hours 7P-7A, please log into the web site [email or web address] and access using universal [place] password for that web site. If you do not have the password, please call the hospital operator.  05/30/2021, 6:12 PM

## 2021-05-30 NOTE — Procedures (Signed)
Patient Name: TALORE CLINE  MRN: [id number]  Epilepsy Attending: Lora Havens  Referring Physician/Provider: Dr Barton Dubois Date: 05/30/2021 Duration: 24.27 mins  Patient history: 87yo F with left sided weakness and dysarthria. EEG to evaluate for seizure.   Level of alertness: Awake, asleep  AEDs during EEG study:   Technical aspects: This EEG study was done with scalp electrodes positioned according to the 10-20 International system of electrode placement. Electrical activity was acquired at a sampling rate of '500Hz'$  and reviewed with a high frequency filter of '70Hz'$  and a low frequency filter of '1Hz'$ . EEG data were recorded continuously and digitally stored.   Description: The posterior dominant rhythm consists of 6 Hz activity of moderate voltage (25-35 uV) seen predominantly in posterior head regions, symmetric and reactive to eye opening and eye closing. Sleep was characterized by vertex waves, sleep spindles (12 to 14 Hz), maximal frontocentral region. Hyperventilation and photic stimulation were not performed.     ABNORMALITY - Background slow  IMPRESSION: This study is suggestive of mild diffuse encephalopathy, nonspecific etiology. No seizures or epileptiform discharges were seen throughout the recording.  Jaman Aro Barbra Sarks

## 2021-05-30 NOTE — Progress Notes (Signed)
EEG Completed; Results Pending  

## 2021-05-30 NOTE — Plan of Care (Signed)
Pt admitted for fall/possible stroke. Pt put on Tele NSR. Bed in lowest position, bed alarm on, 3/4 rails up and locked. BP (!) 146/81 (BP Location: Right Arm)   Pulse 83   Temp 98.5 F (36.9 C) (Oral)   Resp 19   Ht '5\' 2"'$  (1.575 m)   Wt 60.4 kg   SpO2 95%   BMI 24.34 kg/m  Pt had no c/o pain or s/s of distress. Pt failed swallow study downstairs and remains NPO.

## 2021-05-30 NOTE — Evaluation (Signed)
Modified Barium Swallow Progress Note  Patient Details  Name: Vickie Mckee MRN: [id number] Date of Birth: 1931-09-23  Today's Date: 05/30/2021  Modified Barium Swallow completed.  Full report located under Chart Review in the Imaging Section.  Brief recommendations include the following:  Clinical Impression  Pt presents with mild/moderate sensorimotor oropharyngeal dysphagia characterized by silent and sensed aspiration of thin liquids. Pt demonstrates decreased laryngeal vestibule closure with cup sips of thin liquid resulting in penetration to the cords during the swallow that is not sensed and trace amounts typically fall below the cords after the swallow (aspiration is sensed ~50% of the time). Decreased penetration was noted with straw sips of thin liquids, however, trace amounts still fall to the cords and were likely eventually aspirated. Chin tuck was very effective in minimizing/eliminating penetration during the swallow, which also minimized/eliminated aspiration after the swallow. Minimal/occasional flash/shallow penetration visualized with NTL trials with NO aspiration. Puree textures were consumed withouth incident, note as multiple trials went on Pt with mild valleculae residue required a verbal cue for repeat dry swallow. With regular textures Pt demonstrated prolonged oral prep stage, reporting biting her L cheeck during mastication, poor awareness of bolus at times (resulting in holding) and mild/mod oral residue after the swallow. Pt swallowed barium tablet without incident. Recommend initiate conservative diet of D1/puree and NECTAR thick liquids with initiation of skilled ST to implement strategies for oral dysphagia and trials of thin liquids with chin tuck -- further, permit free water (water only after oral care). Despite the fact that chin tuck was effective in minimizing/eliminating penetration/aspiration during the swallow Pt is still at high risk for aspiration with thin  liquids secondary to changes in mentation post this acute CVA resulting in talking during PO despite cues not to, suspected memory deficit (?ability to consistently utilize chin tuck), and mild pharyngeal residue post swallow could still be silently aspirated. Puree is recommended secondary to poor oral control and awareness of new decreased labial/lingual ROM and strength. Pt has good potential for diet upgrade to thin/soft textures with ST therapy and as awareness and mentation hopefully improve. Above reviewed with Pt and Pt's daughters.   Swallow Evaluation Recommendations       SLP Diet Recommendations: Dysphagia 1 (Puree) solids;Nectar thick liquid   Liquid Administration via: Cup   Medication Administration: Whole meds with liquid   Supervision: Patient able to self feed;Staff to assist with self feeding;Intermittent supervision to cue for compensatory strategies   Compensations: Minimize environmental distractions;Slow rate;Small sips/bites   Postural Changes: Remain semi-upright after after feeds/meals (Comment);Seated upright at 90 degrees   Oral Care Recommendations: Oral care BID   Other Recommendations: Remove water pitcher;Order thickener from pharmacy;Prohibited food (jello, ice cream, thin soups)  Mcdaniel Ohms H. Roddie Mc, [place] Speech Language Pathologist   Wende Bushy 05/30/2021,11:38 AM

## 2021-05-31 LAB — CBC
HCT: 40.9 % (ref 36.0–46.0)
Hemoglobin: 13.8 g/dL (ref 12.0–15.0)
MCH: 30.3 pg (ref 26.0–34.0)
MCHC: 33.7 g/dL (ref 30.0–36.0)
MCV: 89.7 fL (ref 80.0–100.0)
Platelets: 269 10*3/uL (ref 150–400)
RBC: 4.56 MIL/uL (ref 3.87–5.11)
RDW: 14.5 % (ref 11.5–15.5)
WBC: 12.6 10*3/uL — ABNORMAL HIGH (ref 4.0–10.5)
nRBC: 0 % (ref 0.0–0.2)

## 2021-05-31 LAB — BASIC METABOLIC PANEL
Anion gap: 9 (ref 5–15)
BUN: 14 mg/dL (ref 8–23)
CO2: 24 mmol/L (ref 22–32)
Calcium: 8.4 mg/dL — ABNORMAL LOW (ref 8.9–10.3)
Chloride: 107 mmol/L (ref 98–111)
Creatinine, Ser: 0.7 mg/dL (ref 0.44–1.00)
GFR, Estimated: >60 mL/min (ref >60)
Glucose, Bld: 118 mg/dL — ABNORMAL HIGH (ref 70–99)
Potassium: 3 mmol/L — ABNORMAL LOW (ref 3.5–5.1)
Sodium: 140 mmol/L (ref 135–145)

## 2021-05-31 LAB — GLUCOSE, CAPILLARY
Glucose-Capillary: 124 mg/dL — ABNORMAL HIGH (ref 70–99)
Glucose-Capillary: 106 mg/dL — ABNORMAL HIGH (ref 70–99)
Glucose-Capillary: 128 mg/dL — ABNORMAL HIGH (ref 70–99)

## 2021-05-31 MED ORDER — ATORVASTATIN CALCIUM 40 MG PO TABS
40.0000 mg | ORAL_TABLET | Freq: Every day | ORAL | Status: DC
  Administered 2021-05-31 – 2021-06-01 (×2): 40 mg via ORAL
  Filled 2021-05-31 (×2): qty 1

## 2021-05-31 MED ORDER — ASPIRIN 300 MG RE SUPP
300.0000 mg | Freq: Once | RECTAL | Status: AC
  Administered 2021-05-31: 300 mg via RECTAL
  Filled 2021-05-31: qty 1

## 2021-05-31 MED ORDER — KCL IN DEXTROSE-NACL 40-5-0.45 MEQ/L-%-% IV SOLN: INTRAVENOUS | Status: DC

## 2021-05-31 NOTE — Progress Notes (Signed)
Pt attempting to take clothes off and removed tele. Dr. Arville Go aware and prn ativan given with relief. Louanne Skye

## 2021-05-31 NOTE — Progress Notes (Signed)
OT Cancellation Note  Patient Details Name: Vickie Mckee MRN: [id number] DOB: [birth year]   Cancelled Treatment:    Reason Eval/Treat Not Completed: Fatigue/lethargy limiting ability to participate. Pt reportedly was given ativan last night and is sleeping soundly this morning. Will attempt to evaluate pt later as time permits.   Appolonia Ackert OT, MOT   Larey Seat 05/31/2021, 9:34 AM

## 2021-05-31 NOTE — Progress Notes (Addendum)
PROGRESS NOTE    [name]  [id number] DOB: Jan 14, 1931 DOA: 05/29/2021 PCP: Seward Carol, MD   Chief Complaint  Patient presents with   Code Stroke    Brief admission narrative:  Vickie Mckee is a 87 y.o. female with medical history significant of essential hypertension, hypothyroidism, gastroesophageal reflux disease, depression, vitamin D deficiency, history of HOCM and chronic low back pain; who presented to the hospital secondary to left-sided weakness and dysarthria.  Patient is on clear about exact Timing of initiation of her symptoms; she reported last felt normal after waking up this morning around 7 AM.  Patient reported ambulating as she normally does after waking up in the morning, and anywhere in between 8 and 9 she has no recollection of the events, family was not able to reach her and contacted EMS in the way to her house.  Patient was found on the floor demonstrating left-sided weakness, left lateralized gaze, vomiting content next to her and also dysarthria.  Patient denies chest pain, shortness of breath, abdominal pain, headaches, fever, chills, dysuria, hematuria, melena, hematochezia, sick contacts or any other complaints.  There is no history of urinary or fecal incontinence when patient was found.   Of note, there is no records of COVID vaccination; PCR in the ED was negative.   ED Course: Patient with strokelike symptoms demonstrating dysarthria, left-sided weakness with a muscle strength of 3 out of 5; positive hypokalemia, and negative CT head without contrast.  Case was discussed with neurology service who felt patient was out of window for tPA; was recommended for patient to be admitted to [facility] to complete stroke work-up.  Assessment & Plan: 1-acute right pontine ischemic stroke -Speech therapy has seen patient and recommendation for dysphagia 1 diet in place -Physical therapy recommending CIR -No large vessels occlusion  appreciated -Neurology evaluation appreciated -EEG demonstrating no epileptiform waves. -Continue aspirin and Plavix for 1 month and then antiplatelet monotherapy after that -Start Lipitor (LDL 127 HDL 37) -Continue risk factor modification and allow permissive hypertension.  2-hypertension -Continue as needed hydralazine -Allowing permissive hypertension.  3-hypothyroidism -Continue Synthroid  4-mild dementia/depression and anxiety-- -Continue Aricept. -Continue sertraline  5-gastroesophageal flux disease -Continue PPI  6-hypokalemia -Electrolytes has been repleted -Will follow trend  7-dysphagia: Secondary to residual effects from acute stroke --Speech therapy eval appreciated, MBS noted -Currently dysphagia 1 diet with medications given as a whole inside applesauce  8)Dispo--- awaiting possible transfer to [facility] for rehab   DVT prophylaxis: Lovenox Code Status: Full code Family Communication: Daughter is at bedside. Disposition:   Status is: Inpatient  Remains inpatient appropriate because:IV treatments appropriate due to intensity of illness or inability to take PO  Dispo: The patient is from: Home              Anticipated d/c is to: [facility]              Patient currently is not medically stable to d/c.   Difficult to place patient No     Consultants:  Neurology service  Procedures:  See below for x-ray reports.  Antimicrobials:  None   Subjective: -Sleepy this a.m. after receiving Ativan overnight -daughter at bedside  Objective: Vitals:   05/31/21 0528 05/31/21 0841 05/31/21 1002 05/31/21 1300  BP: (!) 161/88 (!) 165/73 (!) 145/86 (!) 164/75  Pulse: 88 86 82 72  Resp: 18 (!) 28 (!) 22 20  Temp: 99.3 F (37.4 C) 98.1 F (36.7 C) 98.2 F (36.8 C)  TempSrc: Oral Axillary Axillary Axillary  SpO2: 98% (!) 89% 96% 97%  Weight:      Height:        Intake/Output Summary (Last 24 hours) at 05/31/2021 1420 Last data filed at 05/31/2021 1039 Gross  per 24 hour  Intake 725.37 ml  Output 300 ml  Net 425.37 ml   Filed Weights   05/29/21 1358  Weight: 60.4 kg    Examination:   Physical Exam  Gen:-Quite sleepy after IV Ativan  HEENT:- .AT, No sclera icterus Neck-Supple Neck,No JVD,.  Lungs-  CTAB, and movement is fair  CV- S1, S2 normal, RRR Abd-  +ve B.Sounds, Abd Soft, No tenderness,    Extremity/Skin:- No  edema,   good pulses Psych-at baseline usually quite functional at per family, with only mild cognitive and memory concerns  neuro-generalized weakness, speech deficits persist, swallowing concerns persist, left-sided hemiparesis 3/5 strength noted    Data Reviewed: I have personally reviewed following labs and imaging studies  CBC: Recent Labs  Lab 05/29/21 1327 05/29/21 1401 05/31/21 0427  WBC 21.3*  --  12.6*  NEUTROABS 18.7*  --   --   HGB 15.6* 15.6* 13.8  HCT 45.5 46.0 40.9  MCV 87.2  --  89.7  PLT 317  --  [id number]    Basic Metabolic Panel: Recent Labs  Lab 05/29/21 1327 05/29/21 1328 05/29/21 1401 05/31/21 0427  NA 136  --  141 140  K 2.6*  --  2.6* 3.0*  CL 100  --  100 107  CO2 23  --   --  24  GLUCOSE 137*  --  133* 118*  BUN 18  --  18 14  CREATININE 0.90  --  0.70 0.70  CALCIUM 8.9  --   --  8.4*  MG  --  1.8  --   --     GFR: Estimated Creatinine Clearance: 42.4 mL/min (by C-G formula based on SCr of 0.7 mg/dL).  Liver Function Tests: Recent Labs  Lab 05/29/21 1327  AST 47*  ALT 20  ALKPHOS 37*  BILITOT 1.2  PROT 7.4  ALBUMIN 4.2    CBG: Recent Labs  Lab 05/30/21 1154 05/30/21 1709 05/30/21 2135 05/31/21 0731 05/31/21 1145  GLUCAP 138* 111* 125* 124* 106*     Recent Results (from the past 240 hour(s))  Resp Panel by RT-PCR (Flu A&B, Covid) Nasopharyngeal Swab     Status: None   Collection Time: 05/29/21  1:40 PM   Specimen: Nasopharyngeal Swab; Nasopharyngeal(NP) swabs in vial transport medium  Result Value Ref Range Status   SARS Coronavirus 2 by RT PCR  NEGATIVE NEGATIVE Final    Comment: (NOTE) SARS-CoV-2 target nucleic acids are NOT DETECTED.  The SARS-CoV-2 RNA is generally detectable in upper respiratory specimens during the acute phase of infection. The lowest concentration of SARS-CoV-2 viral copies this assay can detect is 138 copies/mL. A negative result does not preclude SARS-Cov-2 infection and should not be used as the sole basis for treatment or other patient management decisions. A negative result may occur with  improper specimen collection/handling, submission of specimen other than nasopharyngeal swab, presence of viral mutation(s) within the areas targeted by this assay, and inadequate number of viral copies(<138 copies/mL). A negative result must be combined with clinical observations, patient history, and epidemiological information. The expected result is Negative.  Fact Sheet for Patients:  EntrepreneurPulse.com.au  Fact Sheet for Healthcare Providers:  IncredibleEmployment.be  This test is no t yet approved or cleared  by the [place] and  has been authorized for detection and/or diagnosis of SARS-CoV-2 by FDA under an Emergency Use Authorization (EUA). This EUA will remain  in effect (meaning this test can be used) for the duration of the COVID-19 declaration under Section 564(b)(1) of the Act, 21 U.S.C.section 360bbb-3(b)(1), unless the authorization is terminated  or revoked sooner.       Influenza A by PCR NEGATIVE NEGATIVE Final   Influenza B by PCR NEGATIVE NEGATIVE Final    Comment: (NOTE) The Xpert Xpress SARS-CoV-2/FLU/RSV plus assay is intended as an aid in the diagnosis of influenza from Nasopharyngeal swab specimens and should not be used as a sole basis for treatment. Nasal washings and aspirates are unacceptable for Xpert Xpress SARS-CoV-2/FLU/RSV testing.  Fact Sheet for Patients: EntrepreneurPulse.com.au  Fact Sheet for  Healthcare Providers: IncredibleEmployment.be  This test is not yet approved or cleared by the [place] FDA and has been authorized for detection and/or diagnosis of SARS-CoV-2 by FDA under an Emergency Use Authorization (EUA). This EUA will remain in effect (meaning this test can be used) for the duration of the COVID-19 declaration under Section 564(b)(1) of the Act, 21 U.S.C. section 360bbb-3(b)(1), unless the authorization is terminated or revoked.  Performed at [facility], [address] [place], [place] [address]      Radiology Studies: CT ANGIO NECK W OR WO CONTRAST  Result Date: 05/29/2021 CLINICAL DATA:  Neuro deficit, acute, stroke suspected. Additional history provided: Left-sided weakness onset 1 hour prior to arrival. EXAM: CT ANGIOGRAPHY HEAD AND NECK TECHNIQUE: Multidetector CT imaging of the head and neck was performed using the standard protocol during bolus administration of intravenous contrast. Multiplanar CT image reconstructions and MIPs were obtained to evaluate the vascular anatomy. Carotid stenosis measurements (when applicable) are obtained utilizing NASCET criteria, using the distal internal carotid diameter as the denominator. CONTRAST:  22m OMNIPAQUE IOHEXOL 350 MG/ML SOLN COMPARISON:  Noncontrast head CT performed earlier today 05/29/2021. FINDINGS: CTA NECK FINDINGS Aortic arch: Common origin of the innominate and left common carotid arteries. The left vertebral artery arises directly from the aortic arch. Atherosclerotic plaque within the visualized aortic arch and proximal major branch vessels of the neck. No hemodynamically significant innominate or proximal subclavian artery stenosis. Right carotid system: CCA and ICA patent within the neck without stenosis mild calcified plaque within the carotid bifurcation. Left carotid system: CCA and ICA patent within the neck without stenosis. Minimal calcified plaque within the carotid bifurcation.  Nonspecific fusiform dilation of the distal cervical ICA to 7 mm. Vertebral arteries: Patent within the neck. The right vertebral artery is dominant. Mild to moderate stenosis within the distal V2 left vertebral artery. No other significant cervical vertebral artery stenosis is identified. Skeleton: Cervical spondylosis. C3-C4 and C4-C5 grade 1 anterolisthesis. No acute bony abnormality or aggressive osseous lesion. Other neck: No neck mass or cervical lymphadenopathy. Upper chest: No consolidation within the imaged lung apices. Review of the MIP images confirms the above findings CTA HEAD FINDINGS Anterior circulation: The intracranial internal carotid arteries are patent. Nonstenotic calcified plaque within both vessels. The M1 middle cerebral arteries are patent. No M2 proximal branch occlusion or high-grade proximal stenosis is identified. The anterior cerebral arteries are patent. The A1 left anterior cerebral artery is hypoplastic. 1 mm posteriorly projecting vascular protrusion arising from the region of the anterior communicating artery likely reflecting an aneurysm (series 6, image 99). 2 mm aneurysm arising from the distal M1 segment of the right middle cerebral  artery (series 9, image 23). At least one branch vessel appears to arise from this aneurysm Posterior circulation: The non dominant intracranial left vertebral artery is developmentally diminutive, but patent. The intracranial right vertebral artery is patent without significant stenosis. Developmentally diminutive basilar artery with superimposed mild atherosclerotic irregularity and stenoses. Hypoplastic P1 segments bilaterally with sizable bilateral posterior communicating arteries. The posterior cerebral arteries are patent without high-grade proximal stenosis. Venous sinuses: Within the limitations of contrast timing, no convincing thrombus. Anatomic variants: As described Review of the MIP images confirms the above findings No intracranial  large vessel occlusion identified. These results were called by telephone at the time of interpretation on 05/29/2021 at 2:25pm to provider ERIC [facility] , who verbally acknowledged these results. IMPRESSION: CTA neck: 1. The common carotid and internal carotid arteries are patent within the neck without stenosis. Mild atherosclerotic plaque within both carotid systems within the neck, as described. 2. Vertebral arteries patent within the neck. Mild-to-moderate stenosis within the distal V2 segment of the non-dominant left vertebral artery. 3. Mild nonspecific fusiform dilation of the distal cervical left ICA to 7 mm. CTA head: 1. No intracranial large vessel occlusion or proximal high-grade arterial stenosis identified. 2. Intracranial atherosclerotic disease, as described. 3. 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 At least one branch vessel arises from this aneurysm. 4. 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 Electronically Signed   By: Kellie Simmering DO   On: 05/29/2021 14:53   MR BRAIN WO CONTRAST  Result Date: 05/29/2021 CLINICAL DATA:  Neuro deficit, acute, stroke suspected EXAM: MRI HEAD WITHOUT CONTRAST TECHNIQUE: Multiplanar, multiecho pulse sequences of the brain and surrounding structures were obtained without intravenous contrast. COMPARISON:  Same day CT head. FINDINGS: Motion limited study.  Within this limitation: Brain: Acute right pontine infarct. Mild associated edema without mass effect. Mild for age scattered T2 hyperintensities in the white matter, nonspecific but likely related to chronic microvascular ischemic disease. Multiple small remote bilateral cerebellar lacunar infarcts. Moderate atrophy with prominence of the extra-axial spaces. Bilateral choroid plexus xanthogranulomas, which restrict diffusion. No hydrocephalus. No midline shift. No evidence of acute hemorrhage. Vascular: Further evaluated on same day CTA. Skull and  upper cervical spine: Normal marrow signal. Sinuses/Orbits: Mild ethmoid air cell mucosal thickening. Small volume of frothy secretions in the right sphenoid sinus. No acute orbital findings. Other: No sizable mastoid effusions. IMPRESSION: 1. Acute right pontine infarct. Mild associated edema without mass effect. 2. Multiple small remote bilateral cerebellar lacunar infarcts. 3. Mild for age chronic microvascular ischemic disease and moderate atrophy. Electronically Signed   By: Margaretha Sheffield MD   On: 05/29/2021 17:51   DG Swallowing Func-Speech Pathology  Result Date: 05/30/2021 Formatting of this result is different from the original. Objective Swallowing Evaluation: Type of Study: MBS-Modified Barium Swallow Study  Patient Details Name: GALINA OREA MRN: [id number] Date of Birth: 10/23/1930 Today's Date: 05/30/2021 Time: SLP Start Time (ACUTE ONLY): 1015 -SLP Stop Time (ACUTE ONLY): [id number] SLP Time Calculation (min) (ACUTE ONLY): 21 min Past Medical History: Past Medical History: Diagnosis Date  Decreased sense of smell   and taste-negative CT scan-2011  Depression   Essential hypertension   GERD (gastroesophageal reflux disease)   Heart murmur   heard first time 4/12  History of mammogram   2010 and she does not want to repeat them anymore  HOCM (hypertrophic obstructive cardiomyopathy) ([place])   a. Dx by echo 05/2014.  Hypothyroidism  IBS (irritable bowel syndrome)   Impaired fasting glucose   Low back pain   Lumbar radiculopathy   with negative MRI (1991)  Postmenopausal   with osteopenia, bisphosphonates 1/05-1/11, improved osteopenia 4/12-repeat planned late 2015  Shingles   2015  Thyroid nodule   Vitamin D deficiency   repleted on weekly vitamin D 50000 iu Past Surgical History: Past Surgical History: Procedure Laterality Date  ABDOMINAL HYSTERECTOMY    BREAST BIOPSY    x2  CATARACT EXTRACTION    removal with IOL bilateral  GLAUCOMA SURGERY    laser  skin cancer removal    THYROIDECTOMY    subtotal HPI:  TAKIAH VANDENBERG is a 87 y.o. female with medical history significant of essential hypertension, hypothyroidism, gastroesophageal reflux disease, depression, vitamin D deficiency, history of HOCM and chronic low back pain; who presented to the hospital secondary to left-sided weakness and dysarthria. Patient was found on the floor demonstrating left-sided weakness, left lateralized gaze, vomiting content next to her and also dysarthria. MRI reveals "Acute right pontine infarct. Mild associated edema without mass effect. Multiple small remote bilateral cerebellar lacunar infarcts." Pt failed [place] stroke swallow, BSE requested.  No data recorded Assessment / Plan / Recommendation CHL IP CLINICAL IMPRESSIONS 05/30/2021 Clinical Impression Pt presents with mild/moderate sensorimotor oropharyngeal dysphagia characterized by silent and sensed aspiration of thin liquids. Pt demonstrates decreased laryngeal vestibule closure with cup sips of thin liquid resulting in penetration to the cords during the swallow that is not sensed and trace amounts typically fall below the cords after the swallow (aspiration is sensed ~50% of the time). Decreased penetration was noted with straw sips of thin liquids, however, trace amounts still fall to the cords and were likely eventually aspirated. Chin tuck was very effective in minimizing/eliminating penetration during the swallow, which also minimized/eliminated aspiration after the swallow. Minimal/occasional flash/shallow penetration visualized with NTL trials with NO aspiration. Puree textures were consumed withouth incident, note as multiple trials went on Pt with mild valleculae residue required a verbal cue for repeat dry swallow. With regular textures Pt demonstrated prolonged oral prep stage, reporting biting her L cheeck during mastication, poor awareness of bolus at times (resulting in holding) and mild/mod oral residue after the swallow. Pt swallowed barium tablet without  incident. Recommend initiate conservative diet of D1/puree and NECTAR thick liquids with initiation of skilled ST to implement strategies for oral dysphagia and trials of thin liquids with chin tuck -- further, permit free water (water only after oral care). Despite the fact that chin tuck was effective in minimizing/eliminating penetration/aspiration during the swallow Pt is still at high risk for aspiration with thin liquids secondary to changes in mentation post this acute CVA resulting in talking during PO despite cues not to, suspected memory deficit (?ability to consistently utilize chin tuck), and mild pharyngeal residue post swallow could still be silently aspirated. Puree is recommended secondary to poor oral control and awareness of new decreased labial/lingual ROM and strength. Pt has good potential for diet upgrade to thin/soft textures with ST therapy and as awareness and mentation hopefully improve. Above reviewed with Pt and Pt's daughters. SLP Visit Diagnosis Dysphagia, oropharyngeal phase (R13.12) Attention and concentration deficit following -- Frontal lobe and executive function deficit following -- Impact on safety and function Mild aspiration risk;Moderate aspiration risk   CHL IP TREATMENT RECOMMENDATION 05/30/2021 Treatment Recommendations Therapy as outlined in treatment plan below   Prognosis 05/30/2021 Prognosis for Safe Diet Advancement Good Barriers to Reach Goals Cognitive deficits Barriers/Prognosis  Comment -- CHL IP DIET RECOMMENDATION 05/30/2021 SLP Diet Recommendations Dysphagia 1 (Puree) solids;Nectar thick liquid Liquid Administration via Cup Medication Administration Whole meds with liquid Compensations Minimize environmental distractions;Slow rate;Small sips/bites Postural Changes Remain semi-upright after after feeds/meals (Comment);Seated upright at 90 degrees   CHL IP OTHER RECOMMENDATIONS 05/30/2021 Recommended Consults -- Oral Care Recommendations Oral care BID Other Recommendations  Remove water pitcher;Order thickener from pharmacy;Prohibited food (jello, ice cream, thin soups)   CHL IP FOLLOW UP RECOMMENDATIONS 05/30/2021 Follow up Recommendations Skilled Nursing facility;24 hour supervision/assistance   CHL IP FREQUENCY AND DURATION 05/30/2021 Speech Therapy Frequency (ACUTE ONLY) min 2x/week Treatment Duration 1 week      CHL IP ORAL PHASE 05/30/2021 Oral Phase Impaired Oral - Pudding Teaspoon -- Oral - Pudding Cup -- Oral - Honey Teaspoon -- Oral - Honey Cup -- Oral - Nectar Teaspoon NT Oral - Nectar Cup Decreased bolus cohesion;Lingual pumping Oral - Nectar Straw NT Oral - Thin Teaspoon Premature spillage;Decreased bolus cohesion;Piecemeal swallowing;Lingual/palatal residue;Lingual pumping;Weak lingual manipulation Oral - Thin Cup Premature spillage;Decreased bolus cohesion;Piecemeal swallowing;Lingual/palatal residue;Lingual pumping;Weak lingual manipulation Oral - Thin Straw Premature spillage;Decreased bolus cohesion;Piecemeal swallowing;Lingual/palatal residue;Lingual pumping;Weak lingual manipulation Oral - Puree Decreased bolus cohesion;Delayed oral transit;Lingual pumping;Weak lingual manipulation;Lingual/palatal residue;Piecemeal swallowing Oral - Mech Soft NT Oral - Regular Decreased bolus cohesion;Delayed oral transit;Lingual pumping;Weak lingual manipulation;Lingual/palatal residue;Piecemeal swallowing Oral - Multi-Consistency NT Oral - Pill WFL Oral Phase - Comment --  CHL IP PHARYNGEAL PHASE 05/30/2021 Pharyngeal Phase Impaired Pharyngeal- Pudding Teaspoon -- Pharyngeal -- Pharyngeal- Pudding Cup -- Pharyngeal -- Pharyngeal- Honey Teaspoon -- Pharyngeal -- Pharyngeal- Honey Cup -- Pharyngeal -- Pharyngeal- Nectar Teaspoon NT Pharyngeal -- Pharyngeal- Nectar Cup Penetration/Aspiration during swallow;Pharyngeal residue - pyriform;Pharyngeal residue - valleculae Pharyngeal Material enters airway, remains ABOVE vocal cords then ejected out Pharyngeal- Nectar Straw NT Pharyngeal --  Pharyngeal- Thin Teaspoon Delayed swallow initiation-pyriform sinuses;Penetration/Aspiration during swallow;Trace aspiration;Pharyngeal residue - valleculae;Pharyngeal residue - pyriform Pharyngeal Material enters airway, remains ABOVE vocal cords then ejected out Pharyngeal- Thin Cup Delayed swallow initiation-pyriform sinuses;Penetration/Aspiration during swallow;Trace aspiration;Pharyngeal residue - valleculae;Pharyngeal residue - pyriform Pharyngeal Material enters airway, passes BELOW cords and not ejected out despite cough attempt by patient Pharyngeal- Thin Straw Delayed swallow initiation-pyriform sinuses;Penetration/Aspiration during swallow;Pharyngeal residue - valleculae;Pharyngeal residue - pyriform Pharyngeal Material enters airway, remains ABOVE vocal cords then ejected out Pharyngeal- Puree Pharyngeal residue - valleculae Pharyngeal -- Pharyngeal- Mechanical Soft NT Pharyngeal -- Pharyngeal- Regular Pharyngeal residue - valleculae Pharyngeal -- Pharyngeal- Multi-consistency NT Pharyngeal -- Pharyngeal- Pill WFL Pharyngeal -- Pharyngeal Comment --  CHL IP CERVICAL ESOPHAGEAL PHASE 05/30/2021 Cervical Esophageal Phase WFL Pudding Teaspoon -- Pudding Cup -- Honey Teaspoon -- Honey Cup -- Nectar Teaspoon -- Nectar Cup -- Nectar Straw -- Thin Teaspoon -- Thin Cup -- Thin Straw -- Puree -- Mechanical Soft -- Regular -- Multi-consistency -- Pill -- Cervical Esophageal Comment -- Amelia H. Roddie Mc, CCC-SLP Speech Language Pathologist Wende Bushy 05/30/2021, 11:41 AM              EEG adult  Result Date: 05/30/2021 Lora Havens, MD     05/30/2021  5:27 PM Patient Name: Vickie Mckee MRN: [id number] Epilepsy Attending: Lora Havens Referring Physician/Provider: Dr Barton Dubois Date: 05/30/2021 Duration: 24.27 mins Patient history: 87yo F with left sided weakness and dysarthria. EEG to evaluate for seizure. Level of alertness: Awake, asleep AEDs during EEG study: Technical aspects: This EEG  study was done with scalp electrodes positioned according to the 10-20 International system of electrode  placement. Electrical activity was acquired at a sampling rate of '500Hz'$  and reviewed with a high frequency filter of '70Hz'$  and a low frequency filter of '1Hz'$ . EEG data were recorded continuously and digitally stored. Description: The posterior dominant rhythm consists of 6 Hz activity of moderate voltage (25-35 uV) seen predominantly in posterior head regions, symmetric and reactive to eye opening and eye closing. Sleep was characterized by vertex waves, sleep spindles (12 to 14 Hz), maximal frontocentral region. Hyperventilation and photic stimulation were not performed.   ABNORMALITY - Background slow IMPRESSION: This study is suggestive of mild diffuse encephalopathy, nonspecific etiology. No seizures or epileptiform discharges were seen throughout the recording. Lora Havens   ECHOCARDIOGRAM COMPLETE  Result Date: 05/30/2021    ECHOCARDIOGRAM REPORT   Patient Name:   TALLIE PLEWA Date of Exam: 05/29/2021 Medical Rec #:  [id number]       Height:       62.0 in Accession #:    [id number]      Weight:       133.1 lb Date of Birth:  [birth year]        BSA:          1.608 m Patient Age:    36 years        BP:           163/70 mmHg Patient Gender: F               HR:           89 bpm. Exam Location:  [facility] Procedure: 2D Echo, Cardiac Doppler and Color Doppler Indications:    Stroke I63.9  History:        Patient has prior history of Echocardiogram examinations, most                 recent 06/16/2014. Signs/Symptoms:Murmur; Risk                 Factors:Hypertension. HOCM (hypertrophic obstructive                 cardiomyopathy), GERD.  Sonographer:    Wenda Low Referring Phys: [place]  Sonographer Comments: Patient is unable to perform Valsalva and breathing instructions due to AMS. IMPRESSIONS  1. No SAM noted mid/apical cavity obliteration in systole with some flow acceleration peak  velocity in 108msec range . Left ventricular ejection fraction, by estimation, is 60 to 65%. The left ventricle has normal function. The left ventricle has no regional wall motion abnormalities. There is severe left ventricular hypertrophy. Left ventricular diastolic parameters are consistent with Grade I diastolic dysfunction (impaired relaxation).  2. Right ventricular systolic function is normal. The right ventricular size is normal.  3. The mitral valve is normal in structure. No evidence of mitral valve regurgitation. No evidence of mitral stenosis.  4. The aortic valve is normal in structure. Aortic valve regurgitation is not visualized. No aortic stenosis is present.  5. The inferior vena cava is normal in size with greater than 50% respiratory variability, suggesting right atrial pressure of 3 mmHg. FINDINGS  Left Ventricle: No SAM noted mid/apical cavity obliteration in systole with some flow acceleration peak velocity in 263mec range. Left ventricular ejection fraction, by estimation, is 60 to 65%. The left ventricle has normal function. The left ventricle  has no regional wall motion abnormalities. The left ventricular internal cavity size was small. There is severe left ventricular hypertrophy. Left ventricular diastolic parameters are consistent with Grade I diastolic dysfunction (  impaired relaxation). Right Ventricle: The right ventricular size is normal. No increase in right ventricular wall thickness. Right ventricular systolic function is normal. Left Atrium: Left atrial size was normal in size. Right Atrium: Right atrial size was normal in size. Pericardium: There is no evidence of pericardial effusion. Mitral Valve: The mitral valve is normal in structure. No evidence of mitral valve regurgitation. No evidence of mitral valve stenosis. MV peak gradient, 16.6 mmHg. The mean mitral valve gradient is 4.0 mmHg. Tricuspid Valve: The tricuspid valve is normal in structure. Tricuspid valve regurgitation  is not demonstrated. No evidence of tricuspid stenosis. Aortic Valve: The aortic valve is normal in structure. Aortic valve regurgitation is not visualized. No aortic stenosis is present. Aortic valve mean gradient measures 4.0 mmHg. Aortic valve peak gradient measures 8.4 mmHg. Pulmonic Valve: The pulmonic valve was normal in structure. Pulmonic valve regurgitation is not visualized. No evidence of pulmonic stenosis. Aorta: The aortic root is normal in size and structure. Venous: The inferior vena cava is normal in size with greater than 50% respiratory variability, suggesting right atrial pressure of 3 mmHg. IAS/Shunts: No atrial level shunt detected by color flow Doppler.  LEFT VENTRICLE PLAX 2D LVIDd:         2.79 cm Diastology LVIDs:         1.51 cm LV e' medial:    8.59 cm/s LV PW:         0.97 cm LV E/e' medial:  6.0 LV IVS:        1.78 cm LV e' lateral:   6.31 cm/s                        LV E/e' lateral: 8.1  LEFT ATRIUM             Index LA diam:        2.90 cm 1.80 cm/m LA Vol (A2C):   18.7 ml 11.63 ml/m LA Vol (A4C):   23.8 ml 14.80 ml/m LA Biplane Vol: 24.9 ml 15.49 ml/m  AORTIC VALVE AV Vmax:           145.00 cm/s AV Vmean:          98.300 cm/s AV VTI:            0.272 m AV Peak Grad:      8.4 mmHg AV Mean Grad:      4.0 mmHg LVOT Vmax:         120.00 cm/s LVOT Vmean:        86.300 cm/s LVOT VTI:          0.251 m LVOT/AV VTI ratio: 0.92 MITRAL VALVE MV Area (PHT): 6.17 cm     SHUNTS MV Peak grad:  16.6 mmHg    Systemic VTI: 0.25 m MV Mean grad:  4.0 mmHg MV Vmax:       2.04 m/s MV Vmean:      92.0 cm/s MV Decel Time: 123 msec MV E velocity: 51.40 cm/s MV A velocity: 107.00 cm/s MV E/A ratio:  0.48 Jenkins Rouge MD Electronically signed by Jenkins Rouge MD Signature Date/Time: 05/30/2021/8:31:34 AM    Final    CT ANGIO HEAD CODE STROKE  Result Date: 05/29/2021 CLINICAL DATA:  Neuro deficit, acute, stroke suspected. Additional history provided: Left-sided weakness onset 1 hour prior to arrival. EXAM:  CT ANGIOGRAPHY HEAD AND NECK TECHNIQUE: Multidetector CT imaging of the head and neck was performed using the standard protocol during bolus administration  of intravenous contrast. Multiplanar CT image reconstructions and MIPs were obtained to evaluate the vascular anatomy. Carotid stenosis measurements (when applicable) are obtained utilizing NASCET criteria, using the distal internal carotid diameter as the denominator. CONTRAST:  67m OMNIPAQUE IOHEXOL 350 MG/ML SOLN COMPARISON:  Noncontrast head CT performed earlier today 05/29/2021. FINDINGS: CTA NECK FINDINGS Aortic arch: Common origin of the innominate and left common carotid arteries. The left vertebral artery arises directly from the aortic arch. Atherosclerotic plaque within the visualized aortic arch and proximal major branch vessels of the neck. No hemodynamically significant innominate or proximal subclavian artery stenosis. Right carotid system: CCA and ICA patent within the neck without stenosis mild calcified plaque within the carotid bifurcation. Left carotid system: CCA and ICA patent within the neck without stenosis. Minimal calcified plaque within the carotid bifurcation. Nonspecific fusiform dilation of the distal cervical ICA to 7 mm. Vertebral arteries: Patent within the neck. The right vertebral artery is dominant. Mild to moderate stenosis within the distal V2 left vertebral artery. No other significant cervical vertebral artery stenosis is identified. Skeleton: Cervical spondylosis. C3-C4 and C4-C5 grade 1 anterolisthesis. No acute bony abnormality or aggressive osseous lesion. Other neck: No neck mass or cervical lymphadenopathy. Upper chest: No consolidation within the imaged lung apices. Review of the MIP images confirms the above findings CTA HEAD FINDINGS Anterior circulation: The intracranial internal carotid arteries are patent. Nonstenotic calcified plaque within both vessels. The M1 middle cerebral arteries are patent. No M2  proximal branch occlusion or high-grade proximal stenosis is identified. The anterior cerebral arteries are patent. The A1 left anterior cerebral artery is hypoplastic. 1 mm posteriorly projecting vascular protrusion arising from the region of the anterior communicating artery likely reflecting an aneurysm (series 6, image 99). 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 At least one branch vessel appears to arise from this aneurysm Posterior circulation: The non dominant intracranial left vertebral artery is developmentally diminutive, but patent. The intracranial right vertebral artery is patent without significant stenosis. Developmentally diminutive basilar artery with superimposed mild atherosclerotic irregularity and stenoses. Hypoplastic P1 segments bilaterally with sizable bilateral posterior communicating arteries. The posterior cerebral arteries are patent without high-grade proximal stenosis. Venous sinuses: Within the limitations of contrast timing, no convincing thrombus. Anatomic variants: As described Review of the MIP images confirms the above findings No intracranial large vessel occlusion identified. These results were called by telephone at the time of interpretation on 05/29/2021 at 2:25pm to provider [facility], who verbally acknowledged these results. IMPRESSION: CTA neck: 1. The common carotid and internal carotid arteries are patent within the neck without stenosis. Mild atherosclerotic plaque within both carotid systems within the neck, as described. 2. Vertebral arteries patent within the neck. Mild-to-moderate stenosis within the distal V2 segment of the non-dominant left vertebral artery. 3. Mild nonspecific fusiform dilation of the distal cervical left ICA to 7 mm. CTA head: 1. No intracranial large vessel occlusion or proximal high-grade arterial stenosis identified. 2. Intracranial atherosclerotic disease, as described. 3.  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 At least one branch vessel arises from this aneurysm. 4. 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 Electronically Signed   By: KKellie SimmeringDO   On: 05/29/2021 14:53     Scheduled Meds:  vitamin C  500 mg Oral Daily   aspirin  325 mg Oral Daily   atorvastatin  40 mg Oral Daily   clopidogrel  75 mg Oral Daily   donepezil  5 mg Oral QHS   enoxaparin (LOVENOX) injection  40 mg Subcutaneous Q24H   levothyroxine  75 mcg Oral QAC breakfast   pantoprazole  40 mg Oral Daily   sertraline  100 mg Oral Daily   zinc sulfate  220 mg Oral Daily   Continuous Infusions:  dextrose 5 % and 0.45 % NaCl with KCl 40 mEq/L 75 mL/hr at 05/31/21 1139   methocarbamol (ROBAXIN) IV 500 mg (05/30/21 1206)     LOS: 2 days    Roxan Hockey, MD [facility]   To contact the attending provider between 7A-7P or the covering provider during after hours 7P-7A, please log into the web site [email or web address] and access using universal [place] password for that web site. If you do not have the password, please call the hospital operator.  05/31/2021, 2:20 PM

## 2021-05-31 NOTE — Progress Notes (Signed)
Inpatient Rehab Admissions Coordinator:   Spoke to pt's daughter, Butch Penny, over the phone to discuss CIR recommendations.  I explained average length of stay ~2 weeks (dependent upon progress), with 3 hr/day of therapy.  I reviewed Medicare benefits with her.  Family is open to CIR and will plan to provide 24/7 supervision to min assist at discharge in the patient's home.  She did state that pt could also discharge to a family member's home if needed.  Prior to admission pt was mod I with her RW for mobility and ADLs, with assist for all IADLs and family checking in on her daily.  I will continue to follow for medical readiness and potential CIR admit pending bed availability.   Shann Medal, PT, DPT Admissions Coordinator [phone] 05/31/21  1:24 PM

## 2021-06-01 ENCOUNTER — Other Ambulatory Visit: Payer: Self-pay

## 2021-06-01 ENCOUNTER — Inpatient Hospital Stay (HOSPITAL_COMMUNITY)
Admission: RE | Admit: 2021-06-01 | Discharge: 2021-06-30 | DRG: 057 | Disposition: A | Discharge status: Skilled Nursing Facility | Payer: Medicare Other | Source: Other Acute Inpatient Hospital | Attending: Physical Medicine & Rehabilitation | Admitting: Physical Medicine & Rehabilitation

## 2021-06-01 ENCOUNTER — Encounter (HOSPITAL_COMMUNITY): Payer: Self-pay | Admitting: Physical Medicine & Rehabilitation

## 2021-06-01 DIAGNOSIS — M545 Low back pain, unspecified: Secondary | ICD-10-CM | POA: Diagnosis not present

## 2021-06-01 DIAGNOSIS — I639 Cerebral infarction, unspecified: Secondary | ICD-10-CM

## 2021-06-01 DIAGNOSIS — R262 Difficulty in walking, not elsewhere classified: Secondary | ICD-10-CM | POA: Diagnosis not present

## 2021-06-01 DIAGNOSIS — Z885 Allergy status to narcotic agent status: Secondary | ICD-10-CM

## 2021-06-01 DIAGNOSIS — G459 Transient cerebral ischemic attack, unspecified: Secondary | ICD-10-CM | POA: Diagnosis not present

## 2021-06-01 DIAGNOSIS — E876 Hypokalemia: Secondary | ICD-10-CM | POA: Diagnosis not present

## 2021-06-01 DIAGNOSIS — R41841 Cognitive communication deficit: Secondary | ICD-10-CM | POA: Diagnosis not present

## 2021-06-01 DIAGNOSIS — G8194 Hemiplegia, unspecified affecting left nondominant side: Secondary | ICD-10-CM | POA: Diagnosis not present

## 2021-06-01 DIAGNOSIS — K5900 Constipation, unspecified: Secondary | ICD-10-CM | POA: Diagnosis present

## 2021-06-01 DIAGNOSIS — E039 Hypothyroidism, unspecified: Secondary | ICD-10-CM | POA: Diagnosis present

## 2021-06-01 DIAGNOSIS — E785 Hyperlipidemia, unspecified: Secondary | ICD-10-CM | POA: Diagnosis present

## 2021-06-01 DIAGNOSIS — I6350 Cerebral infarction due to unspecified occlusion or stenosis of unspecified cerebral artery: Secondary | ICD-10-CM | POA: Diagnosis present

## 2021-06-01 DIAGNOSIS — R1312 Dysphagia, oropharyngeal phase: Secondary | ICD-10-CM | POA: Diagnosis present

## 2021-06-01 DIAGNOSIS — Z841 Family history of disorders of kidney and ureter: Secondary | ICD-10-CM

## 2021-06-01 DIAGNOSIS — Z8249 Family history of ischemic heart disease and other diseases of the circulatory system: Secondary | ICD-10-CM

## 2021-06-01 DIAGNOSIS — Z825 Family history of asthma and other chronic lower respiratory diseases: Secondary | ICD-10-CM | POA: Diagnosis not present

## 2021-06-01 DIAGNOSIS — E559 Vitamin D deficiency, unspecified: Secondary | ICD-10-CM | POA: Diagnosis present

## 2021-06-01 DIAGNOSIS — K219 Gastro-esophageal reflux disease without esophagitis: Secondary | ICD-10-CM | POA: Diagnosis present

## 2021-06-01 DIAGNOSIS — R770 Abnormality of albumin: Secondary | ICD-10-CM | POA: Diagnosis not present

## 2021-06-01 DIAGNOSIS — Z7401 Bed confinement status: Secondary | ICD-10-CM | POA: Diagnosis not present

## 2021-06-01 DIAGNOSIS — K0889 Other specified disorders of teeth and supporting structures: Secondary | ICD-10-CM | POA: Diagnosis not present

## 2021-06-01 DIAGNOSIS — Z801 Family history of malignant neoplasm of trachea, bronchus and lung: Secondary | ICD-10-CM

## 2021-06-01 DIAGNOSIS — Z79890 Hormone replacement therapy (postmenopausal): Secondary | ICD-10-CM

## 2021-06-01 DIAGNOSIS — Z803 Family history of malignant neoplasm of breast: Secondary | ICD-10-CM

## 2021-06-01 DIAGNOSIS — Z823 Family history of stroke: Secondary | ICD-10-CM

## 2021-06-01 DIAGNOSIS — Q282 Arteriovenous malformation of cerebral vessels: Secondary | ICD-10-CM | POA: Diagnosis not present

## 2021-06-01 DIAGNOSIS — M5416 Radiculopathy, lumbar region: Secondary | ICD-10-CM | POA: Diagnosis present

## 2021-06-01 DIAGNOSIS — E8809 Other disorders of plasma-protein metabolism, not elsewhere classified: Secondary | ICD-10-CM | POA: Diagnosis not present

## 2021-06-01 DIAGNOSIS — Z6823 Body mass index (BMI) 23.0-23.9, adult: Secondary | ICD-10-CM

## 2021-06-01 DIAGNOSIS — I69391 Dysphagia following cerebral infarction: Secondary | ICD-10-CM | POA: Diagnosis not present

## 2021-06-01 DIAGNOSIS — K589 Irritable bowel syndrome without diarrhea: Secondary | ICD-10-CM | POA: Diagnosis present

## 2021-06-01 DIAGNOSIS — I69322 Dysarthria following cerebral infarction: Secondary | ICD-10-CM | POA: Diagnosis not present

## 2021-06-01 DIAGNOSIS — M8580 Other specified disorders of bone density and structure, unspecified site: Secondary | ICD-10-CM | POA: Diagnosis present

## 2021-06-01 DIAGNOSIS — E46 Unspecified protein-calorie malnutrition: Secondary | ICD-10-CM | POA: Diagnosis present

## 2021-06-01 DIAGNOSIS — H9190 Unspecified hearing loss, unspecified ear: Secondary | ICD-10-CM | POA: Diagnosis not present

## 2021-06-01 DIAGNOSIS — F0390 Unspecified dementia without behavioral disturbance: Secondary | ICD-10-CM | POA: Diagnosis not present

## 2021-06-01 DIAGNOSIS — I421 Obstructive hypertrophic cardiomyopathy: Secondary | ICD-10-CM | POA: Diagnosis present

## 2021-06-01 DIAGNOSIS — I69319 Unspecified symptoms and signs involving cognitive functions following cerebral infarction: Secondary | ICD-10-CM | POA: Diagnosis not present

## 2021-06-01 DIAGNOSIS — Z833 Family history of diabetes mellitus: Secondary | ICD-10-CM

## 2021-06-01 DIAGNOSIS — I10 Essential (primary) hypertension: Secondary | ICD-10-CM | POA: Diagnosis present

## 2021-06-01 DIAGNOSIS — Z20822 Contact with and (suspected) exposure to covid-19: Secondary | ICD-10-CM | POA: Diagnosis present

## 2021-06-01 DIAGNOSIS — Z741 Need for assistance with personal care: Secondary | ICD-10-CM | POA: Diagnosis not present

## 2021-06-01 DIAGNOSIS — J690 Pneumonitis due to inhalation of food and vomit: Secondary | ICD-10-CM | POA: Diagnosis not present

## 2021-06-01 DIAGNOSIS — R531 Weakness: Secondary | ICD-10-CM | POA: Diagnosis not present

## 2021-06-01 DIAGNOSIS — F32A Depression, unspecified: Secondary | ICD-10-CM | POA: Diagnosis present

## 2021-06-01 DIAGNOSIS — Z79899 Other long term (current) drug therapy: Secondary | ICD-10-CM

## 2021-06-01 DIAGNOSIS — M6281 Muscle weakness (generalized): Secondary | ICD-10-CM | POA: Diagnosis not present

## 2021-06-01 DIAGNOSIS — I69354 Hemiplegia and hemiparesis following cerebral infarction affecting left non-dominant side: Principal | ICD-10-CM

## 2021-06-01 DIAGNOSIS — F39 Unspecified mood [affective] disorder: Secondary | ICD-10-CM | POA: Diagnosis not present

## 2021-06-01 DIAGNOSIS — I63211 Cerebral infarction due to unspecified occlusion or stenosis of right vertebral arteries: Secondary | ICD-10-CM | POA: Diagnosis not present

## 2021-06-01 LAB — BASIC METABOLIC PANEL
Anion gap: 4 — ABNORMAL LOW (ref 5–15)
BUN: 16 mg/dL (ref 8–23)
CO2: 26 mmol/L (ref 22–32)
Calcium: 8.2 mg/dL — ABNORMAL LOW (ref 8.9–10.3)
Chloride: 108 mmol/L (ref 98–111)
Creatinine, Ser: 0.77 mg/dL (ref 0.44–1.00)
GFR, Estimated: >60 mL/min (ref >60)
Glucose, Bld: 121 mg/dL — ABNORMAL HIGH (ref 70–99)
Potassium: 3.1 mmol/L — ABNORMAL LOW (ref 3.5–5.1)
Sodium: 138 mmol/L (ref 135–145)

## 2021-06-01 LAB — CBC
HCT: 37.4 % (ref 36.0–46.0)
HCT: 41.5 % (ref 36.0–46.0)
Hemoglobin: 12.9 g/dL (ref 12.0–15.0)
Hemoglobin: 13.7 g/dL (ref 12.0–15.0)
MCH: 31.2 pg (ref 26.0–34.0)
MCH: 29.6 pg (ref 26.0–34.0)
MCHC: 34.5 g/dL (ref 30.0–36.0)
MCHC: 33 g/dL (ref 30.0–36.0)
MCV: 90.3 fL (ref 80.0–100.0)
MCV: 89.6 fL (ref 80.0–100.0)
Platelets: 240 10*3/uL (ref 150–400)
Platelets: 262 10*3/uL (ref 150–400)
RBC: 4.14 MIL/uL (ref 3.87–5.11)
RBC: 4.63 MIL/uL (ref 3.87–5.11)
RDW: 14.4 % (ref 11.5–15.5)
RDW: 14.2 % (ref 11.5–15.5)
WBC: 9 10*3/uL (ref 4.0–10.5)
WBC: 9.4 10*3/uL (ref 4.0–10.5)
nRBC: 0 % (ref 0.0–0.2)
nRBC: 0 % (ref 0.0–0.2)

## 2021-06-01 LAB — GLUCOSE, CAPILLARY
Glucose-Capillary: 135 mg/dL — ABNORMAL HIGH (ref 70–99)
Glucose-Capillary: 122 mg/dL — ABNORMAL HIGH (ref 70–99)
Glucose-Capillary: 115 mg/dL — ABNORMAL HIGH (ref 70–99)

## 2021-06-01 LAB — CREATININE, SERUM
Creatinine, Ser: 0.74 mg/dL (ref 0.44–1.00)
GFR, Estimated: >60 mL/min (ref >60)

## 2021-06-01 MED ORDER — POTASSIUM CHLORIDE CRYS ER 20 MEQ PO TBCR
40.0000 meq | EXTENDED_RELEASE_TABLET | ORAL | Status: DC
Start: 2021-06-01 — End: 2021-06-01
  Administered 2021-06-01: 40 meq via ORAL
  Filled 2021-06-01: qty 2

## 2021-06-01 MED ORDER — CLOPIDOGREL BISULFATE 75 MG PO TABS
75.0000 mg | ORAL_TABLET | Freq: Every day | ORAL | Status: DC
  Administered 2021-06-02 – 2021-06-30 (×29): 75 mg via ORAL
  Filled 2021-06-01 (×29): qty 1

## 2021-06-01 MED ORDER — PANTOPRAZOLE SODIUM 40 MG PO TBEC
40.0000 mg | DELAYED_RELEASE_TABLET | Freq: Every day | ORAL | Status: DC
  Administered 2021-06-02 – 2021-06-07 (×6): 40 mg via ORAL
  Filled 2021-06-01 (×7): qty 1

## 2021-06-01 MED ORDER — ENSURE ENLIVE PO LIQD
237.0000 mL | Freq: Two times a day (BID) | ORAL | Status: DC
  Administered 2021-06-01 – 2021-06-30 (×51): 237 mL via ORAL

## 2021-06-01 MED ORDER — LEVOTHYROXINE SODIUM 75 MCG PO TABS
75.0000 ug | ORAL_TABLET | Freq: Every day | ORAL | Status: DC
  Administered 2021-06-02 – 2021-06-30 (×29): 75 ug via ORAL
  Filled 2021-06-01 (×29): qty 1

## 2021-06-01 MED ORDER — ACETAMINOPHEN 650 MG RE SUPP
650.0000 mg | RECTAL | Status: DC | PRN
  Filled 2021-06-01: qty 1

## 2021-06-01 MED ORDER — POLYETHYLENE GLYCOL 3350 17 G PO PACK
17.0000 g | PACK | Freq: Every day | ORAL | Status: DC
  Administered 2021-06-02 – 2021-06-30 (×26): 17 g via ORAL
  Filled 2021-06-01 (×29): qty 1

## 2021-06-01 MED ORDER — PANTOPRAZOLE SODIUM 40 MG PO TBEC: 40.0000 mg | DELAYED_RELEASE_TABLET | Freq: Every day | ORAL | 3 refills | Status: DC

## 2021-06-01 MED ORDER — ASPIRIN EC 81 MG PO TBEC
81.0000 mg | DELAYED_RELEASE_TABLET | Freq: Every day | ORAL | Status: DC
  Administered 2021-06-02 – 2021-06-27 (×26): 81 mg via ORAL
  Filled 2021-06-01 (×27): qty 1

## 2021-06-01 MED ORDER — ENOXAPARIN SODIUM 40 MG/0.4ML IJ SOSY
40.0000 mg | PREFILLED_SYRINGE | INTRAMUSCULAR | Status: DC
  Administered 2021-06-01 – 2021-06-23 (×23): 40 mg via SUBCUTANEOUS
  Filled 2021-06-01 (×25): qty 0.4

## 2021-06-01 MED ORDER — ASCORBIC ACID 500 MG PO TABS
500.0000 mg | ORAL_TABLET | Freq: Every day | ORAL | Status: DC
  Administered 2021-06-02 – 2021-06-30 (×29): 500 mg via ORAL
  Filled 2021-06-01 (×29): qty 1

## 2021-06-01 MED ORDER — POLYETHYLENE GLYCOL 3350 17 G PO PACK
17.0000 g | PACK | Freq: Every day | ORAL | Status: DC
  Administered 2021-06-01: 17 g via ORAL
  Filled 2021-06-01: qty 1

## 2021-06-01 MED ORDER — ZINC SULFATE 220 (50 ZN) MG PO CAPS
220.0000 mg | ORAL_CAPSULE | Freq: Every day | ORAL | Status: DC
  Administered 2021-06-02 – 2021-06-30 (×29): 220 mg via ORAL
  Filled 2021-06-01 (×29): qty 1

## 2021-06-01 MED ORDER — SENNOSIDES-DOCUSATE SODIUM 8.6-50 MG PO TABS: 2.0000 | ORAL_TABLET | Freq: Every day | ORAL | Status: DC

## 2021-06-01 MED ORDER — ACETAMINOPHEN 160 MG/5ML PO SOLN: 650.0000 mg | ORAL | Status: DC | PRN

## 2021-06-01 MED ORDER — SENNOSIDES-DOCUSATE SODIUM 8.6-50 MG PO TABS
2.0000 | ORAL_TABLET | Freq: Every day | ORAL | Status: DC
  Administered 2021-06-01 – 2021-06-29 (×29): 2 via ORAL
  Filled 2021-06-01 (×28): qty 2

## 2021-06-01 MED ORDER — ENOXAPARIN SODIUM 40 MG/0.4ML IJ SOSY: 40.0000 mg | PREFILLED_SYRINGE | INTRAMUSCULAR | Status: DC

## 2021-06-01 MED ORDER — SERTRALINE HCL 50 MG PO TABS
100.0000 mg | ORAL_TABLET | Freq: Every day | ORAL | Status: DC
  Administered 2021-06-02 – 2021-06-30 (×29): 100 mg via ORAL
  Filled 2021-06-01 (×29): qty 2

## 2021-06-01 MED ORDER — ATORVASTATIN CALCIUM 40 MG PO TABS
40.0000 mg | ORAL_TABLET | Freq: Every day | ORAL | Status: DC
  Administered 2021-06-02 – 2021-06-30 (×29): 40 mg via ORAL
  Filled 2021-06-01 (×30): qty 1

## 2021-06-01 MED ORDER — ACETAMINOPHEN 325 MG PO TABS
650.0000 mg | ORAL_TABLET | ORAL | Status: DC | PRN
  Administered 2021-06-03 – 2021-06-27 (×14): 650 mg via ORAL
  Filled 2021-06-01 (×18): qty 2

## 2021-06-01 MED ORDER — DONEPEZIL HCL 5 MG PO TABS
5.0000 mg | ORAL_TABLET | Freq: Every day | ORAL | Status: DC
  Administered 2021-06-01 – 2021-06-29 (×29): 5 mg via ORAL
  Filled 2021-06-01 (×29): qty 1

## 2021-06-01 MED ORDER — CLOPIDOGREL BISULFATE 75 MG PO TABS: 75.0000 mg | ORAL_TABLET | Freq: Every day | ORAL | 5 refills | Status: DC

## 2021-06-01 MED ORDER — SENNOSIDES-DOCUSATE SODIUM 8.6-50 MG PO TABS: 2.0000 | ORAL_TABLET | Freq: Every day | ORAL | 5 refills | Status: DC

## 2021-06-01 MED ORDER — ASPIRIN 81 MG PO TBEC: 81.0000 mg | DELAYED_RELEASE_TABLET | Freq: Every day | ORAL | 0 refills | Status: DC

## 2021-06-01 MED ORDER — ACETAMINOPHEN 325 MG PO TABS: 650.0000 mg | ORAL_TABLET | ORAL | 0 refills | Status: DC | PRN

## 2021-06-01 MED ORDER — ASPIRIN EC 81 MG PO TBEC
81.0000 mg | DELAYED_RELEASE_TABLET | Freq: Every day | ORAL | Status: DC
  Filled 2021-06-01: qty 1

## 2021-06-01 MED ORDER — BISACODYL 10 MG RE SUPP
10.0000 mg | Freq: Once | RECTAL | Status: AC
  Administered 2021-06-01: 10 mg via RECTAL
  Filled 2021-06-01: qty 1

## 2021-06-01 MED ORDER — ATORVASTATIN CALCIUM 40 MG PO TABS: 40.0000 mg | ORAL_TABLET | Freq: Every day | ORAL | 5 refills | Status: DC

## 2021-06-01 MED ORDER — POLYETHYLENE GLYCOL 3350 17 G PO PACK: 17.0000 g | PACK | Freq: Every day | ORAL | 0 refills | Status: AC

## 2021-06-01 NOTE — Progress Notes (Signed)
New Admission Note:     Arrival Method: [facility]     Mental Orientation: alert but disoriented   Assessment:done   Skin:ecchymotic areas arms   IV'S:left forearm SL   Pain:none   Tubes and Drains:none   Safety Measures:discussed to family   Vital Signs:done   Height and Weight:done   Rehab Orientation:done   Family:daughters in room    Notes:   Orders have been reviewed and implemented. Will continue to monitor the patient. Call light has been placed within reach and bed alarm has been activated.   Glenis Smoker BSN, RN-BC Phone number: [phone]

## 2021-06-01 NOTE — Discharge Summary (Signed)
Vickie Mckee, is a 87 y.o. female  DOB [birth year]  MRN [id number].  Admission date:  05/29/2021  Admitting Physician  Barton Dubois, MD  Discharge Date:  06/01/2021   Primary MD  Seward Carol, MD  Recommendations for primary care physician for things to follow:   1)Please take Aspirin 81 mg daily along with Plavix 75 mg daily for 30 days then after that STOP the aspirin and continue ONLY Plavix 75 mg daily indefinitely--for stroke prevention  2)Avoid ibuprofen/Advil/Aleve/Motrin/Goody Powders/Naproxen/BC powders/Meloxicam/Diclofenac/Indomethacin and other Nonsteroidal anti-inflammatory medications as these will make you more likely to bleed and can cause stomach ulcers, can also cause Kidney problems.    Admission Diagnosis  Hypokalemia [E87.6] Acute ischemic stroke (HCC) [I63.9] Ischemic stroke ([place]) [I63.9]   Discharge Diagnosis  Hypokalemia [E87.6] Acute ischemic stroke ([place]) [I63.9] Ischemic stroke ([place]) [I63.9]   Principal Problem:   Ischemic stroke/Acute right pontine infarct.  Active Problems:   Essential hypertension   Hypothyroidism      Past Medical History:  Diagnosis Date   Decreased sense of smell    and taste-negative CT scan-2011   Depression    Essential hypertension    GERD (gastroesophageal reflux disease)    Heart murmur    heard first time 4/12   History of mammogram    2010 and she does not want to repeat them anymore   HOCM (hypertrophic obstructive cardiomyopathy) ([place])    a. Dx by echo 05/2014.   Hypothyroidism    IBS (irritable bowel syndrome)    Impaired fasting glucose    Low back pain    Lumbar radiculopathy    with negative MRI (1991)   Postmenopausal    with osteopenia, bisphosphonates 1/05-1/11, improved osteopenia 4/12-repeat planned late 2015   Shingles    2015   Thyroid nodule    Vitamin D deficiency    repleted on weekly vitamin D 50000 iu     Past Surgical History:  Procedure Laterality Date   ABDOMINAL HYSTERECTOMY     BREAST BIOPSY     x2   CATARACT EXTRACTION     removal with IOL bilateral   GLAUCOMA SURGERY     laser   skin cancer removal     THYROIDECTOMY     subtotal     HPI  from the history and physical done on the day of admission:    Chief Complaint: left sided weakness and dysarthria   HPI: Vickie Mckee is a 87 y.o. female with medical history significant of essential hypertension, hypothyroidism, gastroesophageal reflux disease, depression, vitamin D deficiency, history of HOCM and chronic low back pain; who presented to the hospital secondary to left-sided weakness and dysarthria.  Patient is on clear about exact Timing of initiation of her symptoms; she reported last felt normal after waking up this morning around 7 AM.  Patient reported ambulating as she normally does after waking up in the morning, and anywhere in between 8 and 9 she has no recollection of the events, family was not able  to reach her and contacted EMS in the way to her house.  Patient was found on the floor demonstrating left-sided weakness, left lateralized gaze, vomiting content next to her and also dysarthria.  Patient denies chest pain, shortness of breath, abdominal pain, headaches, fever, chills, dysuria, hematuria, melena, hematochezia, sick contacts or any other complaints.  There is no history of urinary or fecal incontinence when patient was found.   Of note, there is no records of COVID vaccination; PCR in the ED was negative.   ED Course: Patient with strokelike symptoms demonstrating dysarthria, left-sided weakness with a muscle strength of 3 out of 5; positive hypokalemia, and negative CT head without contrast.  Case was discussed with neurology service who felt patient was out of window for tPA; was recommended for patient to be admitted to [facility] to complete stroke work-up.   Review of Systems: As per HPI  otherwise all other systems reviewed and are negative.     Hospital Course:     Assessment & Plan: 1-acute right pontine ischemic stroke -Speech therapy has seen patient and recommendation for dysphagia 1 diet   -Speech and swallowing difficulties continues to improve  -Physical therapy and occupational therapist recommending CIR -Neuro imaging studies show no large vessels occlusion  -Neurology evaluation appreciated -EEG demonstrating no epileptiform waves.  take Aspirin 81 mg daily along with Plavix 75 mg daily for 30 days then after that STOP the aspirin and continue ONLY Plavix 75 mg daily indefinitely--for stroke prevention -Continue Lipitor 40 mg daily (LDL 127 HDL 37)    2-hypertension -We initially allowed permissive hypertension, okay to restart PTA antihypertensives on 06/02/2021   3-hypothyroidism -Continue Synthroid, TSH WNL   4-mild dementia/depression and anxiety--no significant behavioral problems -Continue Aricept. -Continue sertraline   5-gastroesophageal flux disease -Continue PPI   6-hypokalemia -ReplaceD   7-dysphagia: Secondary to residual effects from acute stroke --Speech therapy eval appreciated, MBS noted -Currently dysphagia 1 diet with medications given as a whole inside applesauce   8)Dispo--- transfer to CIR for rehab     DVT prophylaxis: Lovenox Code Status: Full code Family Communication: Daughter is at bedside. Disposition:    Discharge Condition: STABLE   Follow UP-PCP post discharge   Consults obtained -neurologist  Diet and Activity recommendation:  As advised  Discharge Instructions    Discharge Instructions     Call MD for:  difficulty breathing, headache or visual disturbances   Complete by: As directed    Call MD for:  persistant dizziness or light-headedness   Complete by: As directed    Call MD for:  persistant nausea and vomiting   Complete by: As directed    Call MD for:  temperature >100.4   Complete by: As  directed    Diet - low sodium heart healthy   Complete by: As directed    Discharge instructions   Complete by: As directed    1)Please take Aspirin 81 mg daily along with Plavix 75 mg daily for 30 days then after that STOP the aspirin and continue ONLY Plavix 75 mg daily indefinitely--for stroke prevention  2)Avoid ibuprofen/Advil/Aleve/Motrin/Goody Powders/Naproxen/BC powders/Meloxicam/Diclofenac/Indomethacin and other Nonsteroidal anti-inflammatory medications as these will make you more likely to bleed and can cause stomach ulcers, can also cause Kidney problems.   Increase activity slowly   Complete by: As directed         Discharge Medications     Allergies as of 06/01/2021       Reactions   Codeine Nausea  Only        Medication List     STOP taking these medications    hydrochlorothiazide 25 MG tablet Commonly known as: HYDRODIURIL       TAKE these medications    acetaminophen 325 MG tablet Commonly known as: TYLENOL Take 2 tablets (650 mg total) by mouth every 4 (four) hours as needed for mild pain (or temp > 37.5 C (99.5 F)).   aspirin 81 MG EC tablet Take 1 tablet (81 mg total) by mouth daily with breakfast. Swallow whole. Start taking on: June 02, 2021   atorvastatin 40 MG tablet Commonly known as: LIPITOR Take 1 tablet (40 mg total) by mouth daily. Start taking on: June 02, 2021   clopidogrel 75 MG tablet Commonly known as: PLAVIX Take 1 tablet (75 mg total) by mouth daily. Start taking on: June 02, 2021   donepezil 5 MG tablet Commonly known as: ARICEPT Take 5 mg by mouth at bedtime.   levothyroxine 75 MCG tablet Commonly known as: SYNTHROID Take 75 mcg by mouth daily before breakfast.   pantoprazole 40 MG tablet Commonly known as: PROTONIX Take 1 tablet (40 mg total) by mouth daily. Start taking on: June 02, 2021   polyethylene glycol 17 g packet Commonly known as: MIRALAX / GLYCOLAX Take 17 g by mouth daily.   pyridOXINE  25 MG tablet Commonly known as: VITAMIN B-6 Take 25 mg by mouth daily.   senna-docusate 8.6-50 MG tablet Commonly known as: Senokot-S Take 2 tablets by mouth at bedtime.   sertraline 50 MG tablet Commonly known as: ZOLOFT Take 100 mg by mouth daily.   verapamil 240 MG CR tablet Commonly known as: CALAN-SR Take 1 tablet (240 mg total) by mouth at bedtime.   vitamin C 500 MG tablet Commonly known as: ASCORBIC ACID Take 500 mg by mouth daily.   Vitamin D-3 25 MCG (1000 UT) Caps Take 1 capsule by mouth daily.   zinc gluconate 50 MG tablet Take 50 mg by mouth daily.       Major procedures and Radiology Reports - PLEASE review detailed and final reports for all details, in brief -    CT ANGIO NECK W OR WO CONTRAST  Result Date: 05/29/2021 CLINICAL DATA:  Neuro deficit, acute, stroke suspected. Additional history provided: Left-sided weakness onset 1 hour prior to arrival. EXAM: CT ANGIOGRAPHY HEAD AND NECK TECHNIQUE: Multidetector CT imaging of the head and neck was performed using the standard protocol during bolus administration of intravenous contrast. Multiplanar CT image reconstructions and MIPs were obtained to evaluate the vascular anatomy. Carotid stenosis measurements (when applicable) are obtained utilizing NASCET criteria, using the distal internal carotid diameter as the denominator. CONTRAST:  66m OMNIPAQUE IOHEXOL 350 MG/ML SOLN COMPARISON:  Noncontrast head CT performed earlier today 05/29/2021. FINDINGS: CTA NECK FINDINGS Aortic arch: Common origin of the innominate and left common carotid arteries. The left vertebral artery arises directly from the aortic arch. Atherosclerotic plaque within the visualized aortic arch and proximal major branch vessels of the neck. No hemodynamically significant innominate or proximal subclavian artery stenosis. Right carotid system: CCA and ICA patent within the neck without stenosis mild calcified plaque within the carotid bifurcation.  Left carotid system: CCA and ICA patent within the neck without stenosis. Minimal calcified plaque within the carotid bifurcation. Nonspecific fusiform dilation of the distal cervical ICA to 7 mm. Vertebral arteries: Patent within the neck. The right vertebral artery is dominant. Mild to moderate stenosis within the distal V2 left  vertebral artery. No other significant cervical vertebral artery stenosis is identified. Skeleton: Cervical spondylosis. C3-C4 and C4-C5 grade 1 anterolisthesis. No acute bony abnormality or aggressive osseous lesion. Other neck: No neck mass or cervical lymphadenopathy. Upper chest: No consolidation within the imaged lung apices. Review of the MIP images confirms the above findings CTA HEAD FINDINGS Anterior circulation: The intracranial internal carotid arteries are patent. Nonstenotic calcified plaque within both vessels. The M1 middle cerebral arteries are patent. No M2 proximal branch occlusion or high-grade proximal stenosis is identified. The anterior cerebral arteries are patent. The A1 left anterior cerebral artery is hypoplastic. 1 mm posteriorly projecting vascular protrusion arising from the region of the anterior communicating artery likely reflecting an aneurysm (series 6, image 99). 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 At least one branch vessel appears to arise from this aneurysm Posterior circulation: The non dominant intracranial left vertebral artery is developmentally diminutive, but patent. The intracranial right vertebral artery is patent without significant stenosis. Developmentally diminutive basilar artery with superimposed mild atherosclerotic irregularity and stenoses. Hypoplastic P1 segments bilaterally with sizable bilateral posterior communicating arteries. The posterior cerebral arteries are patent without high-grade proximal stenosis. Venous sinuses: Within the limitations of contrast  timing, no convincing thrombus. Anatomic variants: As described Review of the MIP images confirms the above findings No intracranial large vessel occlusion identified. These results were called by telephone at the time of interpretation on 05/29/2021 at 2:25pm to provider ERIC [facility] , who verbally acknowledged these results. IMPRESSION: CTA neck: 1. The common carotid and internal carotid arteries are patent within the neck without stenosis. Mild atherosclerotic plaque within both carotid systems within the neck, as described. 2. Vertebral arteries patent within the neck. Mild-to-moderate stenosis within the distal V2 segment of the non-dominant left vertebral artery. 3. Mild nonspecific fusiform dilation of the distal cervical left ICA to 7 mm. CTA head: 1. No intracranial large vessel occlusion or proximal high-grade arterial stenosis identified. 2. Intracranial atherosclerotic disease, as described. 3. 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 At least one branch vessel arises from this aneurysm. 4. 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 Electronically Signed   By: Kellie Simmering DO   On: 05/29/2021 14:53   MR BRAIN WO CONTRAST  Result Date: 05/29/2021 CLINICAL DATA:  Neuro deficit, acute, stroke suspected EXAM: MRI HEAD WITHOUT CONTRAST TECHNIQUE: Multiplanar, multiecho pulse sequences of the brain and surrounding structures were obtained without intravenous contrast. COMPARISON:  Same day CT head. FINDINGS: Motion limited study.  Within this limitation: Brain: Acute right pontine infarct. Mild associated edema without mass effect. Mild for age scattered T2 hyperintensities in the white matter, nonspecific but likely related to chronic microvascular ischemic disease. Multiple small remote bilateral cerebellar lacunar infarcts. Moderate atrophy with prominence of the extra-axial spaces. Bilateral choroid plexus xanthogranulomas, which restrict  diffusion. No hydrocephalus. No midline shift. No evidence of acute hemorrhage. Vascular: Further evaluated on same day CTA. Skull and upper cervical spine: Normal marrow signal. Sinuses/Orbits: Mild ethmoid air cell mucosal thickening. Small volume of frothy secretions in the right sphenoid sinus. No acute orbital findings. Other: No sizable mastoid effusions. IMPRESSION: 1. Acute right pontine infarct. Mild associated edema without mass effect. 2. Multiple small remote bilateral cerebellar lacunar infarcts. 3. Mild for age chronic microvascular ischemic disease and moderate atrophy. Electronically Signed   By: Margaretha Sheffield MD  On: 05/29/2021 17:51   DG Swallowing Func-Speech Pathology  Result Date: 05/30/2021 Formatting of this result is different from the original. Objective Swallowing Evaluation: Type of Study: MBS-Modified Barium Swallow Study  Patient Details Name: Vickie Mckee MRN: [id number] Date of Birth: 1931/06/15 Today's Date: 05/30/2021 Time: SLP Start Time (ACUTE ONLY): 1015 -SLP Stop Time (ACUTE ONLY): [id number] SLP Time Calculation (min) (ACUTE ONLY): 21 min Past Medical History: Past Medical History: Diagnosis Date  Decreased sense of smell   and taste-negative CT scan-2011  Depression   Essential hypertension   GERD (gastroesophageal reflux disease)   Heart murmur   heard first time 4/12  History of mammogram   2010 and she does not want to repeat them anymore  HOCM (hypertrophic obstructive cardiomyopathy) ([place])   a. Dx by echo 05/2014.  Hypothyroidism   IBS (irritable bowel syndrome)   Impaired fasting glucose   Low back pain   Lumbar radiculopathy   with negative MRI (1991)  Postmenopausal   with osteopenia, bisphosphonates 1/05-1/11, improved osteopenia 4/12-repeat planned late 2015  Shingles   2015  Thyroid nodule   Vitamin D deficiency   repleted on weekly vitamin D 50000 iu Past Surgical History: Past Surgical History: Procedure Laterality Date  ABDOMINAL HYSTERECTOMY    BREAST BIOPSY    x2   CATARACT EXTRACTION    removal with IOL bilateral  GLAUCOMA SURGERY    laser  skin cancer removal    THYROIDECTOMY    subtotal HPI: Vickie Mckee is a 87 y.o. female with medical history significant of essential hypertension, hypothyroidism, gastroesophageal reflux disease, depression, vitamin D deficiency, history of HOCM and chronic low back pain; who presented to the hospital secondary to left-sided weakness and dysarthria. Patient was found on the floor demonstrating left-sided weakness, left lateralized gaze, vomiting content next to her and also dysarthria. MRI reveals "Acute right pontine infarct. Mild associated edema without mass effect. Multiple small remote bilateral cerebellar lacunar infarcts." Pt failed [place] stroke swallow, BSE requested.  No data recorded Assessment / Plan / Recommendation CHL IP CLINICAL IMPRESSIONS 05/30/2021 Clinical Impression Pt presents with mild/moderate sensorimotor oropharyngeal dysphagia characterized by silent and sensed aspiration of thin liquids. Pt demonstrates decreased laryngeal vestibule closure with cup sips of thin liquid resulting in penetration to the cords during the swallow that is not sensed and trace amounts typically fall below the cords after the swallow (aspiration is sensed ~50% of the time). Decreased penetration was noted with straw sips of thin liquids, however, trace amounts still fall to the cords and were likely eventually aspirated. Chin tuck was very effective in minimizing/eliminating penetration during the swallow, which also minimized/eliminated aspiration after the swallow. Minimal/occasional flash/shallow penetration visualized with NTL trials with NO aspiration. Puree textures were consumed withouth incident, note as multiple trials went on Pt with mild valleculae residue required a verbal cue for repeat dry swallow. With regular textures Pt demonstrated prolonged oral prep stage, reporting biting her L cheeck during mastication, poor  awareness of bolus at times (resulting in holding) and mild/mod oral residue after the swallow. Pt swallowed barium tablet without incident. Recommend initiate conservative diet of D1/puree and NECTAR thick liquids with initiation of skilled ST to implement strategies for oral dysphagia and trials of thin liquids with chin tuck -- further, permit free water (water only after oral care). Despite the fact that chin tuck was effective in minimizing/eliminating penetration/aspiration during the swallow Pt is still at high risk for aspiration with thin liquids secondary to changes  in mentation post this acute CVA resulting in talking during PO despite cues not to, suspected memory deficit (?ability to consistently utilize chin tuck), and mild pharyngeal residue post swallow could still be silently aspirated. Puree is recommended secondary to poor oral control and awareness of new decreased labial/lingual ROM and strength. Pt has good potential for diet upgrade to thin/soft textures with ST therapy and as awareness and mentation hopefully improve. Above reviewed with Pt and Pt's daughters. SLP Visit Diagnosis Dysphagia, oropharyngeal phase (R13.12) Attention and concentration deficit following -- Frontal lobe and executive function deficit following -- Impact on safety and function Mild aspiration risk;Moderate aspiration risk   CHL IP TREATMENT RECOMMENDATION 05/30/2021 Treatment Recommendations Therapy as outlined in treatment plan below   Prognosis 05/30/2021 Prognosis for Safe Diet Advancement Good Barriers to Reach Goals Cognitive deficits Barriers/Prognosis Comment -- CHL IP DIET RECOMMENDATION 05/30/2021 SLP Diet Recommendations Dysphagia 1 (Puree) solids;Nectar thick liquid Liquid Administration via Cup Medication Administration Whole meds with liquid Compensations Minimize environmental distractions;Slow rate;Small sips/bites Postural Changes Remain semi-upright after after feeds/meals (Comment);Seated upright at 90  degrees   CHL IP OTHER RECOMMENDATIONS 05/30/2021 Recommended Consults -- Oral Care Recommendations Oral care BID Other Recommendations Remove water pitcher;Order thickener from pharmacy;Prohibited food (jello, ice cream, thin soups)   CHL IP FOLLOW UP RECOMMENDATIONS 05/30/2021 Follow up Recommendations Skilled Nursing facility;24 hour supervision/assistance   CHL IP FREQUENCY AND DURATION 05/30/2021 Speech Therapy Frequency (ACUTE ONLY) min 2x/week Treatment Duration 1 week      CHL IP ORAL PHASE 05/30/2021 Oral Phase Impaired Oral - Pudding Teaspoon -- Oral - Pudding Cup -- Oral - Honey Teaspoon -- Oral - Honey Cup -- Oral - Nectar Teaspoon NT Oral - Nectar Cup Decreased bolus cohesion;Lingual pumping Oral - Nectar Straw NT Oral - Thin Teaspoon Premature spillage;Decreased bolus cohesion;Piecemeal swallowing;Lingual/palatal residue;Lingual pumping;Weak lingual manipulation Oral - Thin Cup Premature spillage;Decreased bolus cohesion;Piecemeal swallowing;Lingual/palatal residue;Lingual pumping;Weak lingual manipulation Oral - Thin Straw Premature spillage;Decreased bolus cohesion;Piecemeal swallowing;Lingual/palatal residue;Lingual pumping;Weak lingual manipulation Oral - Puree Decreased bolus cohesion;Delayed oral transit;Lingual pumping;Weak lingual manipulation;Lingual/palatal residue;Piecemeal swallowing Oral - Mech Soft NT Oral - Regular Decreased bolus cohesion;Delayed oral transit;Lingual pumping;Weak lingual manipulation;Lingual/palatal residue;Piecemeal swallowing Oral - Multi-Consistency NT Oral - Pill WFL Oral Phase - Comment --  CHL IP PHARYNGEAL PHASE 05/30/2021 Pharyngeal Phase Impaired Pharyngeal- Pudding Teaspoon -- Pharyngeal -- Pharyngeal- Pudding Cup -- Pharyngeal -- Pharyngeal- Honey Teaspoon -- Pharyngeal -- Pharyngeal- Honey Cup -- Pharyngeal -- Pharyngeal- Nectar Teaspoon NT Pharyngeal -- Pharyngeal- Nectar Cup Penetration/Aspiration during swallow;Pharyngeal residue - pyriform;Pharyngeal residue -  valleculae Pharyngeal Material enters airway, remains ABOVE vocal cords then ejected out Pharyngeal- Nectar Straw NT Pharyngeal -- Pharyngeal- Thin Teaspoon Delayed swallow initiation-pyriform sinuses;Penetration/Aspiration during swallow;Trace aspiration;Pharyngeal residue - valleculae;Pharyngeal residue - pyriform Pharyngeal Material enters airway, remains ABOVE vocal cords then ejected out Pharyngeal- Thin Cup Delayed swallow initiation-pyriform sinuses;Penetration/Aspiration during swallow;Trace aspiration;Pharyngeal residue - valleculae;Pharyngeal residue - pyriform Pharyngeal Material enters airway, passes BELOW cords and not ejected out despite cough attempt by patient Pharyngeal- Thin Straw Delayed swallow initiation-pyriform sinuses;Penetration/Aspiration during swallow;Pharyngeal residue - valleculae;Pharyngeal residue - pyriform Pharyngeal Material enters airway, remains ABOVE vocal cords then ejected out Pharyngeal- Puree Pharyngeal residue - valleculae Pharyngeal -- Pharyngeal- Mechanical Soft NT Pharyngeal -- Pharyngeal- Regular Pharyngeal residue - valleculae Pharyngeal -- Pharyngeal- Multi-consistency NT Pharyngeal -- Pharyngeal- Pill WFL Pharyngeal -- Pharyngeal Comment --  CHL IP CERVICAL ESOPHAGEAL PHASE 05/30/2021 Cervical Esophageal Phase WFL Pudding Teaspoon -- Pudding Cup -- Honey Teaspoon -- Honey Cup -- [employer]  Teaspoon -- Nectar Cup -- Nectar Straw -- Thin Teaspoon -- Thin Cup -- Thin Straw -- Puree -- Mechanical Soft -- Regular -- Multi-consistency -- Pill -- Cervical Esophageal Comment -- Amelia H. Roddie Mc, CCC-SLP Speech Language Pathologist Wende Bushy 05/30/2021, 11:41 AM              EEG adult  Result Date: 05/30/2021 Lora Havens, MD     05/30/2021  5:27 PM Patient Name: Vickie Mckee MRN: [id number] Epilepsy Attending: Lora Havens Referring Physician/Provider: Dr Barton Dubois Date: 05/30/2021 Duration: 24.27 mins Patient history: 87yo F with left sided weakness and  dysarthria. EEG to evaluate for seizure. Level of alertness: Awake, asleep AEDs during EEG study: Technical aspects: This EEG study was done with scalp electrodes positioned according to the 10-20 International system of electrode placement. Electrical activity was acquired at a sampling rate of '500Hz'$  and reviewed with a high frequency filter of '70Hz'$  and a low frequency filter of '1Hz'$ . EEG data were recorded continuously and digitally stored. Description: The posterior dominant rhythm consists of 6 Hz activity of moderate voltage (25-35 uV) seen predominantly in posterior head regions, symmetric and reactive to eye opening and eye closing. Sleep was characterized by vertex waves, sleep spindles (12 to 14 Hz), maximal frontocentral region. Hyperventilation and photic stimulation were not performed.   ABNORMALITY - Background slow IMPRESSION: This study is suggestive of mild diffuse encephalopathy, nonspecific etiology. No seizures or epileptiform discharges were seen throughout the recording. Lora Havens   ECHOCARDIOGRAM COMPLETE  Result Date: 05/30/2021    ECHOCARDIOGRAM REPORT   Patient Name:   Vickie Mckee Date of Exam: 05/29/2021 Medical Rec #:  [id number]       Height:       62.0 in Accession #:    [id number]      Weight:       133.1 lb Date of Birth:  [birth year]        BSA:          1.608 m Patient Age:    50 years        BP:           163/70 mmHg Patient Gender: F               HR:           89 bpm. Exam Location:  [facility] Procedure: 2D Echo, Cardiac Doppler and Color Doppler Indications:    Stroke I63.9  History:        Patient has prior history of Echocardiogram examinations, most                 recent 06/16/2014. Signs/Symptoms:Murmur; Risk                 Factors:Hypertension. HOCM (hypertrophic obstructive                 cardiomyopathy), GERD.  Sonographer:    Wenda Low Referring Phys: [place]  Sonographer Comments: Patient is unable to perform Valsalva and breathing  instructions due to AMS. IMPRESSIONS  1. No SAM noted mid/apical cavity obliteration in systole with some flow acceleration peak velocity in 46msec range . Left ventricular ejection fraction, by estimation, is 60 to 65%. The left ventricle has normal function. The left ventricle has no regional wall motion abnormalities. There is severe left ventricular hypertrophy. Left ventricular diastolic parameters are consistent with Grade I diastolic dysfunction (impaired relaxation).  2. Right ventricular systolic function  is normal. The right ventricular size is normal.  3. The mitral valve is normal in structure. No evidence of mitral valve regurgitation. No evidence of mitral stenosis.  4. The aortic valve is normal in structure. Aortic valve regurgitation is not visualized. No aortic stenosis is present.  5. The inferior vena cava is normal in size with greater than 50% respiratory variability, suggesting right atrial pressure of 3 mmHg. FINDINGS  Left Ventricle: No SAM noted mid/apical cavity obliteration in systole with some flow acceleration peak velocity in 68msec range. Left ventricular ejection fraction, by estimation, is 60 to 65%. The left ventricle has normal function. The left ventricle  has no regional wall motion abnormalities. The left ventricular internal cavity size was small. There is severe left ventricular hypertrophy. Left ventricular diastolic parameters are consistent with Grade I diastolic dysfunction (impaired relaxation). Right Ventricle: The right ventricular size is normal. No increase in right ventricular wall thickness. Right ventricular systolic function is normal. Left Atrium: Left atrial size was normal in size. Right Atrium: Right atrial size was normal in size. Pericardium: There is no evidence of pericardial effusion. Mitral Valve: The mitral valve is normal in structure. No evidence of mitral valve regurgitation. No evidence of mitral valve stenosis. MV peak gradient, 16.6 mmHg. The  mean mitral valve gradient is 4.0 mmHg. Tricuspid Valve: The tricuspid valve is normal in structure. Tricuspid valve regurgitation is not demonstrated. No evidence of tricuspid stenosis. Aortic Valve: The aortic valve is normal in structure. Aortic valve regurgitation is not visualized. No aortic stenosis is present. Aortic valve mean gradient measures 4.0 mmHg. Aortic valve peak gradient measures 8.4 mmHg. Pulmonic Valve: The pulmonic valve was normal in structure. Pulmonic valve regurgitation is not visualized. No evidence of pulmonic stenosis. Aorta: The aortic root is normal in size and structure. Venous: The inferior vena cava is normal in size with greater than 50% respiratory variability, suggesting right atrial pressure of 3 mmHg. IAS/Shunts: No atrial level shunt detected by color flow Doppler.  LEFT VENTRICLE PLAX 2D LVIDd:         2.79 cm Diastology LVIDs:         1.51 cm LV e' medial:    8.59 cm/s LV PW:         0.97 cm LV E/e' medial:  6.0 LV IVS:        1.78 cm LV e' lateral:   6.31 cm/s                        LV E/e' lateral: 8.1  LEFT ATRIUM             Index LA diam:        2.90 cm 1.80 cm/m LA Vol (A2C):   18.7 ml 11.63 ml/m LA Vol (A4C):   23.8 ml 14.80 ml/m LA Biplane Vol: 24.9 ml 15.49 ml/m  AORTIC VALVE AV Vmax:           145.00 cm/s AV Vmean:          98.300 cm/s AV VTI:            0.272 m AV Peak Grad:      8.4 mmHg AV Mean Grad:      4.0 mmHg LVOT Vmax:         120.00 cm/s LVOT Vmean:        86.300 cm/s LVOT VTI:          0.251 m LVOT/AV VTI ratio: 0.92  MITRAL VALVE MV Area (PHT): 6.17 cm     SHUNTS MV Peak grad:  16.6 mmHg    Systemic VTI: 0.25 m MV Mean grad:  4.0 mmHg MV Vmax:       2.04 m/s MV Vmean:      92.0 cm/s MV Decel Time: 123 msec MV E velocity: 51.40 cm/s MV A velocity: 107.00 cm/s MV E/A ratio:  0.48 Jenkins Rouge MD Electronically signed by Jenkins Rouge MD Signature Date/Time: 05/30/2021/8:31:34 AM    Final    CT HEAD CODE STROKE WO CONTRAST  Result Date:  05/29/2021 CLINICAL DATA:  Code stroke. Neuro deficit, acute, stroke suspected. Additional history provided: Left-sided weakness, last known well 1 hour prior to arrival. EXAM: CT HEAD WITHOUT CONTRAST TECHNIQUE: Contiguous axial images were obtained from the base of the skull through the vertex without intravenous contrast. COMPARISON:  Head CT 11/03/2020. FINDINGS: Brain: Mild generalized cerebral atrophy. There is no acute intracranial hemorrhage. No demarcated cortical infarct. No extra-axial fluid collection. No evidence of an intracranial mass. No midline shift. Vascular: No hyperdense vessel.  Atherosclerotic calcifications Skull: Normal. Negative for fracture or focal lesion. Sinuses/Orbits: Leftward gaze. Mild mucosal thickening within the left ethmoid air cells. Small volume frothy secretions within the right sphenoid sinus. ASPECTS (Nicasio Stroke Program Early CT Score) - Ganglionic level infarction (caudate, lentiform nuclei, internal capsule, insula, M1-M3 cortex): 7 - Supraganglionic infarction (M4-M6 cortex): 3 Total score (0-10 with 10 being normal): 10 These results were called by telephone at the time of interpretation on 05/29/2021 at 1:38 pm to provider [facility] , who verbally acknowledged these results. IMPRESSION: No evidence of acute intracranial abnormality. ASPECTS is 10. Mild generalized cerebral atrophy. Paranasal sinus disease at the imaged levels, as described. Electronically Signed   By: Kellie Simmering DO   On: 05/29/2021 13:40   CT ANGIO HEAD CODE STROKE  Result Date: 05/29/2021 CLINICAL DATA:  Neuro deficit, acute, stroke suspected. Additional history provided: Left-sided weakness onset 1 hour prior to arrival. EXAM: CT ANGIOGRAPHY HEAD AND NECK TECHNIQUE: Multidetector CT imaging of the head and neck was performed using the standard protocol during bolus administration of intravenous contrast. Multiplanar CT image reconstructions and MIPs were obtained to evaluate the vascular  anatomy. Carotid stenosis measurements (when applicable) are obtained utilizing NASCET criteria, using the distal internal carotid diameter as the denominator. CONTRAST:  82m OMNIPAQUE IOHEXOL 350 MG/ML SOLN COMPARISON:  Noncontrast head CT performed earlier today 05/29/2021. FINDINGS: CTA NECK FINDINGS Aortic arch: Common origin of the innominate and left common carotid arteries. The left vertebral artery arises directly from the aortic arch. Atherosclerotic plaque within the visualized aortic arch and proximal major branch vessels of the neck. No hemodynamically significant innominate or proximal subclavian artery stenosis. Right carotid system: CCA and ICA patent within the neck without stenosis mild calcified plaque within the carotid bifurcation. Left carotid system: CCA and ICA patent within the neck without stenosis. Minimal calcified plaque within the carotid bifurcation. Nonspecific fusiform dilation of the distal cervical ICA to 7 mm. Vertebral arteries: Patent within the neck. The right vertebral artery is dominant. Mild to moderate stenosis within the distal V2 left vertebral artery. No other significant cervical vertebral artery stenosis is identified. Skeleton: Cervical spondylosis. C3-C4 and C4-C5 grade 1 anterolisthesis. No acute bony abnormality or aggressive osseous lesion. Other neck: No neck mass or cervical lymphadenopathy. Upper chest: No consolidation within the imaged lung apices. Review of the MIP images confirms the above findings CTA HEAD FINDINGS Anterior circulation:  The intracranial internal carotid arteries are patent. Nonstenotic calcified plaque within both vessels. The M1 middle cerebral arteries are patent. No M2 proximal branch occlusion or high-grade proximal stenosis is identified. The anterior cerebral arteries are patent. The A1 left anterior cerebral artery is hypoplastic. 1 mm posteriorly projecting vascular protrusion arising from the region of the anterior communicating  artery likely reflecting an aneurysm (series 6, image 99). 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 At least one branch vessel appears to arise from this aneurysm Posterior circulation: The non dominant intracranial left vertebral artery is developmentally diminutive, but patent. The intracranial right vertebral artery is patent without significant stenosis. Developmentally diminutive basilar artery with superimposed mild atherosclerotic irregularity and stenoses. Hypoplastic P1 segments bilaterally with sizable bilateral posterior communicating arteries. The posterior cerebral arteries are patent without high-grade proximal stenosis. Venous sinuses: Within the limitations of contrast timing, no convincing thrombus. Anatomic variants: As described Review of the MIP images confirms the above findings No intracranial large vessel occlusion identified. These results were called by telephone at the time of interpretation on 05/29/2021 at 2:25pm to provider [facility] , who verbally acknowledged these results. IMPRESSION: CTA neck: 1. The common carotid and internal carotid arteries are patent within the neck without stenosis. Mild atherosclerotic plaque within both carotid systems within the neck, as described. 2. Vertebral arteries patent within the neck. Mild-to-moderate stenosis within the distal V2 segment of the non-dominant left vertebral artery. 3. Mild nonspecific fusiform dilation of the distal cervical left ICA to 7 mm. CTA head: 1. No intracranial large vessel occlusion or proximal high-grade arterial stenosis identified. 2. Intracranial atherosclerotic disease, as described. 3. 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 At least one branch vessel arises from this aneurysm. 4. 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 Electronically Signed   By: Kellie Simmering DO   On: 05/29/2021  14:53    Micro Results   Recent Results (from the past 240 hour(s))  Resp Panel by RT-PCR (Flu A&B, Covid) Nasopharyngeal Swab     Status: None   Collection Time: 05/29/21  1:40 PM   Specimen: Nasopharyngeal Swab; Nasopharyngeal(NP) swabs in vial transport medium  Result Value Ref Range Status   SARS Coronavirus 2 by RT PCR NEGATIVE NEGATIVE Final    Comment: (NOTE) SARS-CoV-2 target nucleic acids are NOT DETECTED.  The SARS-CoV-2 RNA is generally detectable in upper respiratory specimens during the acute phase of infection. The lowest concentration of SARS-CoV-2 viral copies this assay can detect is 138 copies/mL. A negative result does not preclude SARS-Cov-2 infection and should not be used as the sole basis for treatment or other patient management decisions. A negative result may occur with  improper specimen collection/handling, submission of specimen other than nasopharyngeal swab, presence of viral mutation(s) within the areas targeted by this assay, and inadequate number of viral copies(<138 copies/mL). A negative result must be combined with clinical observations, patient history, and epidemiological information. The expected result is Negative.  Fact Sheet for Patients:  EntrepreneurPulse.com.au  Fact Sheet for Healthcare Providers:  IncredibleEmployment.be  This test is no t yet approved or cleared by the [place] FDA and  has been authorized for detection and/or diagnosis of SARS-CoV-2 by FDA under an Emergency Use Authorization (EUA). This EUA will remain  in effect (meaning this test can be used) for the duration of the COVID-19 declaration under  Section 564(b)(1) of the Act, 21 U.S.C.section 360bbb-3(b)(1), unless the authorization is terminated  or revoked sooner.       Influenza A by PCR NEGATIVE NEGATIVE Final   Influenza B by PCR NEGATIVE NEGATIVE Final    Comment: (NOTE) The Xpert Xpress SARS-CoV-2/FLU/RSV plus  assay is intended as an aid in the diagnosis of influenza from Nasopharyngeal swab specimens and should not be used as a sole basis for treatment. Nasal washings and aspirates are unacceptable for Xpert Xpress SARS-CoV-2/FLU/RSV testing.  Fact Sheet for Patients: EntrepreneurPulse.com.au  Fact Sheet for Healthcare Providers: IncredibleEmployment.be  This test is not yet approved or cleared by the [place] FDA and has been authorized for detection and/or diagnosis of SARS-CoV-2 by FDA under an Emergency Use Authorization (EUA). This EUA will remain in effect (meaning this test can be used) for the duration of the COVID-19 declaration under Section 564(b)(1) of the Act, 21 U.S.C. section 360bbb-3(b)(1), unless the authorization is terminated or revoked.  Performed at [facility], [address] [place], [place] [address]     Today   Subjective    Vickie Mckee today has no new concerns, -Patient's daughter at bedside, --Speech therapy has seen patient and recommendation for dysphagia 1 diet   -Speech and swallowing difficulties continues to improve  -Tolerating oral intake better -No BM since admission,           Patient has been seen and examined prior to discharge   Objective   Blood pressure (!) 158/70, pulse 71, temperature 98 F (36.7 C), temperature source Oral, resp. rate 16, height '5\' 2"'$  (1.575 m), weight 60.4 kg, SpO2 98 %.   Intake/Output Summary (Last 24 hours) at 06/01/2021 1113 Last data filed at 06/01/2021 0900 Gross per 24 hour  Intake 1923.74 ml  Output --  Net 1923.74 ml    Exam Gen:- Awake Alert, no acute distress  HEENT:- [place].AT, No sclera icterus Neck-Supple Neck,No JVD,.  Lungs-  CTAB , good air movement bilaterally  CV- S1, S2 normal, regular Abd-  +ve B.Sounds, Abd Soft, No tenderness,    Extremity/Skin:- No  edema,   good pulses Psych-at baseline usually quite functional as per family, with only  mild cognitive and memory concerns  neuro-generalized weakness, speech and swallowing problems improving , left-sided hemiparesis 3/5 strength noted   Data Review   CBC w Diff:  Lab Results  Component Value Date   WBC 9.0 06/01/2021   HGB 12.9 06/01/2021   HCT 37.4 06/01/2021   PLT 240 06/01/2021   LYMPHOPCT 11 05/29/2021   BANDSPCT 2 05/29/2021   MONOPCT 0 05/29/2021   EOSPCT 1 05/29/2021   BASOPCT 0 05/29/2021    CMP:  Lab Results  Component Value Date   NA 138 06/01/2021   K 3.1 (L) 06/01/2021   CL 108 06/01/2021   CO2 26 06/01/2021   BUN 16 06/01/2021   CREATININE 0.77 06/01/2021   PROT 7.4 05/29/2021   ALBUMIN 4.2 05/29/2021   BILITOT 1.2 05/29/2021   ALKPHOS 37 (L) 05/29/2021   AST 47 (H) 05/29/2021   ALT 20 05/29/2021    Total Discharge time is about 33 minutes  Roxan Hockey M.D on 06/01/2021 at 11:13 AM  Go to [email or web address] -  for contact info  [facility] - Office  [phone]

## 2021-06-01 NOTE — Evaluation (Signed)
Occupational Therapy Evaluation Patient Details Name: Vickie Mckee MRN: [id number] DOB: 09/16/1931 Today's Date: 06/01/2021    History of Present Illness Vickie Mckee is a 87 y.o. female with medical history significant of essential hypertension, hypothyroidism, gastroesophageal reflux disease, depression, vitamin D deficiency, history of HOCM and chronic low back pain; who presented to the hospital secondary to left-sided weakness and dysarthria.  Patient is on clear about exact  Timing of initiation of her symptoms; she reported last felt normal after waking up this morning around 7 AM.  Patient reported ambulating as she normally does after waking up in the morning, and anywhere in between 8 and 9 she has no recollection of the events, family was not able to reach her and contacted EMS in the way to her house.  Patient was found on the floor demonstrating left-sided weakness, left lateralized gaze, vomiting content next to her and also dysarthria.  Patient denies chest pain, shortness of breath, abdominal pain, headaches, fever, chills, dysuria, hematuria, melena, hematochezia, sick contacts or any other complaints.  There is no history of urinary or fecal incontinence when patient was found.   Clinical Impression   Pt reportedly mostly indepednent with ADL's at basline with use of RW and assist for transfers into shower PRN. Pt is assisted by family for IADL's. Pt required maximal assist for supine to sit and sit to supine. Pt demonstrates flaccid L UE without trace movement. Pt demonstrates possible R visual field deficits due to R head tilt and rotation. Pt required physical assist to maintain upright position at EOB with noted posterior and L lean. Pt will benefit from continued OT in the hospital and recommended venue below to increase strength, balance, and endurance for safe ADL's.        Follow Up Recommendations  CIR    Equipment Recommendations  None recommended by OT     Recommendations for Other Services Other (comment) (Neuro vision specialist.)     Precautions / Restrictions Precautions Precautions: Fall Restrictions Weight Bearing Restrictions: No      Mobility Bed Mobility Overal bed mobility: Needs Assistance Bed Mobility: Supine to Sit;Sit to Supine;Rolling     Supine to sit: Max assist Sit to supine: Max assist   General bed mobility comments: Little active movement to assist with mobility.                          Balance Overall balance assessment: Needs assistance Sitting-balance support: Single extremity supported;Feet unsupported Sitting balance-Leahy Scale: Poor Sitting balance - Comments: Unable to remain sitting at EOB without additional physical assist. Postural control: Posterior lean;Left lateral lean                                 ADL either performed or assessed with clinical judgement   ADL Overall ADL's : Needs assistance/impaired Eating/Feeding: Set up;Minimal assistance;Sitting   Grooming: Moderate assistance;Minimal assistance;Sitting;Bed level   Upper Body Bathing: Moderate assistance;Bed level   Lower Body Bathing: Maximal assistance;Total assistance;Bed level   Upper Body Dressing : Moderate assistance;Bed level   Lower Body Dressing: Maximal assistance;Total assistance;Bed level   Toilet Transfer: Maximal assistance;Total assistance;Stand-pivot;RW   Toileting- Clothing Manipulation and Hygiene: Total assistance   Tub/ Shower Transfer: Maximal assistance;Total assistance   Functional mobility during ADLs: Total assistance;Maximal assistance General ADL Comments: Pt unbale to sit up at bedside without assistance. Sit to stand not attempted  due to poor postural stability seated. Clinical judgement used to determine ADL levels.     Vision Baseline Vision/History: Wears glasses Wears Glasses: Distance only Patient Visual Report: No change from baseline Vision Assessment?:  Yes Eye Alignment: Within Functional Limits Ocular Range of Motion: Within Functional Limits Alignment/Gaze Preference: Head tilt;Head turned (to R side) Tracking/Visual Pursuits: Able to track stimulus in all quads without difficulty Convergence: Impaired (comment) (No convergence noted when tested.) Visual Fields: Right visual field deficit (Possible R side visual field deficit due to pt demonstrating mild R peripheral vision loss along with R head tilt.)                Pertinent Vitals/Pain Pain Assessment: Faces Faces Pain Scale: Hurts a little bit Pain Location: neck Pain Descriptors / Indicators: Aching Pain Intervention(s): Limited activity within patient's tolerance;Monitored during session;Repositioned     Hand Dominance Right   Extremity/Trunk Assessment Upper Extremity Assessment Upper Extremity Assessment: RUE deficits/detail;LUE deficits/detail RUE Deficits / Details: generalized weakness RUE Coordination: WNL LUE Deficits / Details: Flaccid L UE. No trace movement. Pt reported increased sensitivity to light touch. LUE Sensation: decreased proprioception LUE Coordination: decreased fine motor;decreased gross motor   Lower Extremity Assessment Lower Extremity Assessment: Defer to PT evaluation       Communication Communication Communication: No difficulties   Cognition Arousal/Alertness: Awake/alert Behavior During Therapy: WFL for tasks assessed/performed Overall Cognitive Status: Within Functional Limits for tasks assessed                                                Home Living Family/patient expects to be discharged to:: Private residence Living Arrangements: Alone Available Help at Discharge: Family;Friend(s);Available PRN/intermittently Type of Home: House Home Access: Ramped entrance     Home Layout: Two level;Able to live on main level with bedroom/bathroom           Bathroom Accessibility: Yes   Home Equipment:  Walker - 2 wheels;Shower seat;Bedside commode          Prior Functioning/Environment Level of Independence: Independent with assistive device(s)        Comments: Patient and family state houshold ambulation with RW, independent with basic ADL with assist to transfer into shower; family and friends assist PRN        OT Problem List: Decreased strength;Decreased range of motion;Decreased activity tolerance;Impaired balance (sitting and/or standing);Impaired vision/perception;Decreased coordination;Decreased safety awareness;Impaired sensation;Impaired UE functional use      OT Treatment/Interventions: Self-care/ADL training;Therapeutic exercise;Therapeutic activities;Neuromuscular education;DME and/or AE instruction;Manual therapy;Visual/perceptual remediation/compensation;Patient/family education;Balance training    OT Goals(Current goals can be found in the care plan section) Acute Rehab OT Goals Patient Stated Goal: return home after getting more mobile OT Goal Formulation: With patient Time For Goal Achievement: 06/15/21 Potential to Achieve Goals: Fair  OT Frequency: Min 2X/week    End of Session    Activity Tolerance: Patient tolerated treatment well Patient left: in bed;with call bell/phone within reach;with family/visitor present  OT Visit Diagnosis: Unsteadiness on feet (R26.81);Muscle weakness (generalized) (M62.81);Other symptoms and signs involving the nervous system (R29.898);Hemiplegia and hemiparesis Hemiplegia - Right/Left: Left Hemiplegia - dominant/non-dominant: Non-Dominant Hemiplegia - caused by: Cerebral infarction                Time: 0850-0906 OT Time Calculation (min): 16 min Charges:  OT General Charges $OT Visit: 1 Visit OT Evaluation $OT Eval  Moderate Complexity: 1 Mod  Chez Bulnes OT, MOT  Larey Seat 06/01/2021, 9:21 AM

## 2021-06-01 NOTE — PMR Pre-admission (Signed)
PMR Admission Coordinator Pre-Admission Assessment  Patient: Vickie Mckee is an 87 y.o., female MRN: [id number] DOB: 1931-03-26 Height: '5\' 2"'$  (157.5 cm) Weight: 60.4 kg  Insurance Information HMO:     PPO:      PCP:      IPA:      80/20:      OTHER:  PRIMARY: Medicare A/B      Policy#: [id number]      Subscriber: pt CM Name:       Phone#:      Fax#:  Pre-Cert#: verified online      Employer:  Benefits:  Phone #:      Name:  Eff. Date: 05/22/98 A/B     Deduct: $1556      Out of Pocket Max: n/a      Life Max: n/a CIR: 100%      SNF: 20 full days  Outpatient: 80%     Co-Pay: 20% Home Health: 100%      Co-Pay:  DME: 80%     Co-Pay: 20% Providers:  SECONDARYLynda Rainwater      Policy#: [id number]     Phone#: [phone]  Financial Counselor:       Phone#:   The "Data Collection Information Summary" for patients in Inpatient Rehabilitation Facilities with attached "Privacy Act [place] Records" was provided and verbally reviewed with: Family  Emergency Contact Information Contact Information     Name Relation Home Work [place] Daughter [phone]  [phone]   Eulogio Ditch Daughter   [phone]       Current Medical History  Patient Admitting Diagnosis: R pontine CVA  History of Present Illness: Vickie Mckee is an 87 year old right-handed female with history of hypertension hypothyroidism GERD chronic low back pain, hypertrophic obstructive cardiomyopathy, some memory loss maintained on Aricept.   Presented to [facility] 05/29/2021 with acute onset of left-sided weakness and dysarthria as well as left lateral gaze and 1 episode of vomiting.  Cranial CT scan negative for acute changes.  CT angiogram of head and neck showed no large vessel occlusion or high-grade stenosis.  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  Patient did  not receive tPA.  MRI showed acute right pontine infarction.  Multiple small remote bilateral cerebellar lacunar infarcts.  Echocardiogram with ejection fraction of 60 to 65% no wall motion abnormalities.  EEG negative for seizure.  Admission chemistries alcohol negative, potassium 2.6, glucose 137, urine drug screen negative, hemoglobin A1c 5.7.  Currently maintained on aspirin 81 mg daily and Plavix 75 mg daily for CVA prophylaxis x1 month then antiplatelet monotherapy.  Subcutaneous Lovenox for DVT prophylaxis.  Dysphagia #1 nectar thick liquid diet.  Therapy evaluations completed due to patient's left-sided weakness dysarthria/dysphagia was recommended for a comprehensive rehab program.  Complete NIHSS TOTAL: 10  Patient's medical record from [facility] has been reviewed by the rehabilitation admission coordinator and physician.  Past Medical History  Past Medical History:  Diagnosis Date   Decreased sense of smell    and taste-negative CT scan-2011   Depression    Essential hypertension    GERD (gastroesophageal reflux disease)    Heart murmur    heard first time 4/12   History of mammogram    2010 and she does not want to repeat them anymore   HOCM (hypertrophic obstructive cardiomyopathy) ([place])    a.  Dx by echo 05/2014.   Hypothyroidism    IBS (irritable bowel syndrome)    Impaired fasting glucose    Low back pain    Lumbar radiculopathy    with negative MRI (1991)   Postmenopausal    with osteopenia, bisphosphonates 1/05-1/11, improved osteopenia 4/12-repeat planned late 2015   Shingles    2015   Thyroid nodule    Vitamin D deficiency    repleted on weekly vitamin D 50000 iu    Family History   family history includes Breast cancer in her mother; CAD in her brother and sister; COPD in her brother; CVA in her father; Diabetes in her brother; Heart failure in her sister; Hypertension in her father; Kidney disease in her sister; Lung cancer in her brother; Stroke in her  father.  Prior Rehab/Hospitalizations Has the patient had prior rehab or hospitalizations prior to admission? No  Has the patient had major surgery during 100 days prior to admission? No   Current Medications  Current Facility-Administered Medications:    acetaminophen (TYLENOL) tablet 650 mg, 650 mg, Oral, Q4H PRN, 650 mg at 05/31/21 1321 **OR** acetaminophen (TYLENOL) 160 MG/5ML solution 650 mg, 650 mg, Per Tube, Q4H PRN **OR** acetaminophen (TYLENOL) suppository 650 mg, 650 mg, Rectal, Q4H PRN, Barton Dubois, MD   ascorbic acid (VITAMIN C) tablet 500 mg, 500 mg, Oral, Daily, Barton Dubois, MD, 500 mg at 06/01/21 1055   [START ON 06/02/2021] aspirin EC tablet 81 mg, 81 mg, Oral, Q breakfast, Emokpae, Courage, MD   aspirin tablet 325 mg, 325 mg, Oral, Daily, Emokpae, Courage, MD, 325 mg at 06/01/21 1055   atorvastatin (LIPITOR) tablet 40 mg, 40 mg, Oral, Daily, Emokpae, Courage, MD, 40 mg at 06/01/21 1055   bisacodyl (DULCOLAX) suppository 10 mg, 10 mg, Rectal, Once, Emokpae, Courage, MD   clopidogrel (PLAVIX) tablet 75 mg, 75 mg, Oral, Daily, Kerney Elbe, MD, 75 mg at 06/01/21 1055   dextrose 5 % and 0.45 % NaCl with KCl 40 mEq/L infusion, , Intravenous, Continuous, Emokpae, Courage, MD, Last Rate: 40 mL/hr at 06/01/21 1058, Rate Change at 06/01/21 1058   donepezil (ARICEPT) tablet 5 mg, 5 mg, Oral, QHS, Barton Dubois, MD, 5 mg at 05/31/21 2146   enoxaparin (LOVENOX) injection 40 mg, 40 mg, Subcutaneous, Q24H, Barton Dubois, MD, 40 mg at 05/31/21 1627   hydrALAZINE (APRESOLINE) injection 10 mg, 10 mg, Intravenous, Q8H PRN, Barton Dubois, MD   levothyroxine (SYNTHROID) tablet 75 mcg, 75 mcg, Oral, QAC breakfast, Barton Dubois, MD, 75 mcg at 06/01/21 0536   pantoprazole (PROTONIX) EC tablet 40 mg, 40 mg, Oral, Daily, Barton Dubois, MD, 40 mg at 06/01/21 1055   polyethylene glycol (MIRALAX / GLYCOLAX) packet 17 g, 17 g, Oral, Daily, Emokpae, Courage, MD   potassium chloride SA  (KLOR-CON) CR tablet 40 mEq, 40 mEq, Oral, Q3H, Emokpae, Courage, MD, 40 mEq at 06/01/21 1055   senna-docusate (Senokot-S) tablet 2 tablet, 2 tablet, Oral, QHS, Emokpae, Courage, MD   sertraline (ZOLOFT) tablet 100 mg, 100 mg, Oral, Daily, Barton Dubois, MD, 100 mg at 06/01/21 1054   zinc sulfate capsule 220 mg, 220 mg, Oral, Daily, Barton Dubois, MD, 220 mg at 06/01/21 1055  Patients Current Diet:  Diet Order             DIET - DYS 1 Room service appropriate? Yes; Fluid consistency: Nectar Thick  Diet effective now  Precautions / Restrictions Precautions Precautions: Fall Restrictions Weight Bearing Restrictions: No   Has the patient had 2 or more falls or a fall with injury in the past year? Yes  Prior Activity Level Limited Community (1-2x/wk): independent with RW, family completes IADLs only.  Pt does not drive. Recently widowed.  Prior Functional Level Self Care: Did the patient need help bathing, dressing, using the toilet or eating? Independent  Indoor Mobility: Did the patient need assistance with walking from room to room (with or without device)? Independent  Stairs: Did the patient need assistance with internal or external stairs (with or without device)? Independent  Functional Cognition: Did the patient need help planning regular tasks such as shopping or remembering to take medications? Needed some help  Home Assistive Devices / Equipment Home Assistive Devices/Equipment: Other (Comment) Home Equipment: Walker - 2 wheels, Shower seat, Bedside commode  Prior Device Use: Indicate devices/aids used by the patient prior to current illness, exacerbation or injury? Walker  Current Functional Level Cognition  Arousal/Alertness: Awake/alert Overall Cognitive Status: Within Functional Limits for tasks assessed Orientation Level: Disoriented to time, Disoriented to situation Attention: Sustained Sustained Attention: Impaired Sustained  Attention Impairment: Verbal basic, Functional basic Memory: Impaired Memory Impairment: Retrieval deficit, Decreased recall of new information, Decreased short term memory Decreased Short Term Memory: Verbal basic, Functional basic Awareness: Impaired Awareness Impairment: Anticipatory impairment Problem Solving: Impaired Problem Solving Impairment: Verbal basic, Functional basic Behaviors: Perseveration Safety/Judgment: Impaired    Extremity Assessment (includes Sensation/Coordination)  Upper Extremity Assessment: RUE deficits/detail, LUE deficits/detail RUE Deficits / Details: generalized weakness RUE Coordination: WNL LUE Deficits / Details: Flaccid L UE. No trace movement. Pt reported increased sensitivity to light touch. LUE Sensation: decreased proprioception LUE Coordination: decreased fine motor, decreased gross motor  Lower Extremity Assessment: Defer to PT evaluation LLE Deficits / Details: LLE flaccid with spontanous L ankle DF, no spasticity LLE; grossly tender and hypertonic L hip adductors likely due to strain with falling LLE Sensation: WNL    ADLs  Overall ADL's : Needs assistance/impaired Eating/Feeding: Set up, Minimal assistance, Sitting Grooming: Moderate assistance, Minimal assistance, Sitting, Bed level Upper Body Bathing: Moderate assistance, Bed level Lower Body Bathing: Maximal assistance, Total assistance, Bed level Upper Body Dressing : Moderate assistance, Bed level Lower Body Dressing: Maximal assistance, Total assistance, Bed level Toilet Transfer: Maximal assistance, Total assistance, Stand-pivot, RW Toileting- Clothing Manipulation and Hygiene: Total assistance Tub/ Shower Transfer: Maximal assistance, Total assistance Functional mobility during ADLs: Total assistance, Maximal assistance General ADL Comments: Pt unbale to sit up at bedside without assistance. Sit to stand not attempted due to poor postural stability seated. Clinical judgement used  to determine ADL levels.    Mobility  Overal bed mobility: Needs Assistance Bed Mobility: Supine to Sit, Sit to Supine Rolling: Mod assist, Min assist Supine to sit: Max assist, Mod assist Sit to supine: Max assist, Mod assist General bed mobility comments: right extremities attempting    Transfers  Overall transfer level: Needs assistance Equipment used: 1 person hand held assist Transfers: Sit to/from Stand, Radiographer, therapeutic, Lateral/Scoot Transfers Sit to Stand: Max assist Anterior-Posterior transfers: Mod assist  Lateral/Scoot Transfers: Mod assist, Max assist General transfer comment: attempted sit to stand and lateral scoots to left/head of bed with left knee blocked; mod assist to perform bridging to scoot lateral to right in bed    Ambulation / Gait / Stairs / Wheelchair Mobility  Ambulation/Gait General Gait Details: unable    Posture / Balance Dynamic Sitting Balance Sitting  balance - Comments: Unable to remain sitting at EOB without additional physical assist. Balance Overall balance assessment: Needs assistance Sitting-balance support: Single extremity supported, Feet unsupported, Feet supported Sitting balance-Leahy Scale: Poor Sitting balance - Comments: Unable to remain sitting at EOB without additional physical assist. Postural control: Left lateral lean Standing balance support: Single extremity supported, During functional activity Standing balance-Leahy Scale: Zero    Special needs/care consideration N/a   Previous Home Environment (from acute therapy documentation) Living Arrangements: Alone Available Help at Discharge: Family, Friend(s), Available PRN/intermittently Type of Home: House Home Layout: Two level, Able to live on main level with bedroom/bathroom Home Access: Ramped entrance Bathroom Accessibility: Yes [place]: No  Discharge Living Setting Plans for Discharge Living Setting: Patient's home, Lives with (comment)  (patient's daughters will rotate staying with her) Type of Home at Discharge: House Discharge Home Layout: Able to live on main level with bedroom/bathroom Discharge Home Access: [place] entrance Discharge Bathroom Shower/Tub: Tub/shower unit Discharge Bathroom Toilet: Standard Discharge Bathroom Accessibility: Yes How Accessible: Accessible via walker Does the patient have any problems obtaining your medications?: No  Social/Family/Support Systems Anticipated Caregiver: daughters, Shelva Majestic and Eulogio Ditch Anticipated Caregiver's Contact Information: Butch Penny [phone]; Tomi Bamberger [phone] Ability/Limitations of Caregiver: min asisst Caregiver Availability: 24/7 Discharge Plan Discussed with Primary Caregiver: Yes Is Caregiver In Agreement with Plan?: Yes Does Caregiver/Family have Issues with Lodging/Transportation while Pt is in Rehab?: No  Goals Patient/Family Goal for Rehab: PT/OT min assist, SLP supervision Expected length of stay: 21-24 days Pt/Family Agrees to Admission and willing to participate: Yes Program Orientation Provided & Reviewed with Pt/Caregiver Including Roles  & Responsibilities: Yes  Decrease burden of Care through IP rehab admission: n/a  Possible need for SNF placement upon discharge: Potentially  Patient Condition: I have reviewed medical records from [facility], spoken with CSW, and daughter. I discussed via phone for inpatient rehabilitation assessment.  Patient will benefit from ongoing PT, OT, and SLP, can actively participate in 3 hours of therapy a day 5 days of the week, and can make measurable gains during the admission.  Patient will also benefit from the coordinated team approach during an Inpatient Acute Rehabilitation admission.  The patient will receive intensive therapy as well as Rehabilitation physician, nursing, social worker, and care management interventions.  Due to bladder management, bowel management, safety, skin/wound care, disease  management, medication administration, pain management, and patient education the patient requires 24 hour a day rehabilitation nursing.  The patient is currently mod to max +2 with mobility and basic ADLs.  Discharge setting and therapy post discharge at home with home health is anticipated.  Patient has agreed to participate in the Acute Inpatient Rehabilitation Program and will admit today.  Preadmission Screen Completed By:  Michel Santee, PT, DPT 06/01/2021 11:08 AM ______________________________________________________________________   Discussed status with Dr. Dagoberto Ligas on 06/01/21  at 11:16 AM  and received approval for admission today.  Admission Coordinator:  Michel Santee, PT, DPT, time 11:17 AM Sudie Grumbling 06/01/21    Assessment/Plan: Diagnosis: Does the need for close, 24 hr/day Medical supervision in concert with the patient's rehab needs make it unreasonable for this patient to be served in a less intensive setting? Yes Co-Morbidities requiring supervision/potential complications: L hemiparesis, dysphagia, dysarthria, R pntine infarct, hypokalemia, HTN Due to bladder management, bowel management, safety, skin/wound care, disease management, medication administration, and patient education, does the patient require 24 hr/day rehab nursing? Yes Does the patient require coordinated care of a physician, rehab nurse,  PT, OT, and SLP to address physical and functional deficits in the context of the above medical diagnosis(es)? Yes Addressing deficits in the following areas: balance, endurance, locomotion, strength, transferring, bowel/bladder control, bathing, dressing, feeding, grooming, toileting, speech, language, and swallowing Can the patient actively participate in an intensive therapy program of at least 3 hrs of therapy 5 days a week? Yes The potential for patient to make measurable gains while on inpatient rehab is good and fair Anticipated functional outcomes upon discharge from  inpatient rehab: supervision and min assist PT, supervision and min assist OT, supervision SLP Estimated rehab length of stay to reach the above functional goals is: 21-24 days Anticipated discharge destination: Home 10. Overall Rehab/Functional Prognosis: good and fair   MD Signature:

## 2021-06-01 NOTE — H&P (Signed)
Physical Medicine and Rehabilitation Admission H&P    Chief Complaint  Patient presents with   Code Stroke  : HPI: Vickie Mckee is an 87 year old right-handed female with history of hypertension hypothyroidism GERD chronic low back pain, hypertrophic obstructive cardiomyopathy, some memory loss maintained on Aricept.  Per chart review patient lives alone independent with assistive device.  Two-level home bed and bath main level with a ramped entrance.  She has family and friends to assist her as needed.  Presented to [facility] 05/29/2021 with acute onset of left-sided weakness and dysarthria as well as left lateral gaze and 1 episode of vomiting.  Cranial CT scan negative for acute changes.  CT angiogram of head and neck showed no large vessel occlusion or high-grade stenosis.  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  Patient did not receive tPA.  MRI showed acute right pontine infarction.  Multiple small remote bilateral cerebellar lacunar infarcts.  Echocardiogram with ejection fraction of 60 to 65% no wall motion abnormalities.  EEG negative for seizure.  Admission chemistries alcohol negative, potassium 2.6, glucose 137, urine drug screen negative, hemoglobin A1c 5.7.  Currently maintained on aspirin 81 mg daily and Plavix 75 mg daily for CVA prophylaxis x1 month then antiplatelet monotherapy.  Subcutaneous Lovenox for DVT prophylaxis.  Dysphagia #1 nectar thick liquid diet.  Therapy evaluations completed due to patient's left-sided weakness dysarthria/dysphagia was admitted for a comprehensive rehab program.   Pt reports LBM was maybe 2 days ago- denies constipation- peeing well per pt.  Denies pain.    Review of Systems  Constitutional:  Negative for chills and fever.  HENT:  Positive for hearing loss.   Eyes:  Negative for blurred vision and double vision.   Respiratory:  Negative for cough and shortness of breath.   Cardiovascular:  Negative for chest pain, palpitations and leg swelling.  Gastrointestinal:  Positive for constipation. Negative for heartburn, nausea and vomiting.       GERD  Genitourinary:  Negative for dysuria, flank pain and hematuria.  Musculoskeletal:  Positive for back pain.  Skin:  Negative for rash.  Neurological:  Positive for speech change and weakness.  Psychiatric/Behavioral:  Positive for depression and memory loss.   All other systems reviewed and are negative. Past Medical History:  Diagnosis Date   Decreased sense of smell    and taste-negative CT scan-2011   Depression    Essential hypertension    GERD (gastroesophageal reflux disease)    Heart murmur    heard first time 4/12   History of mammogram    2010 and she does not want to repeat them anymore   HOCM (hypertrophic obstructive cardiomyopathy) ([place])    a. Dx by echo 05/2014.   Hypothyroidism    IBS (irritable bowel syndrome)    Impaired fasting glucose    Low back pain    Lumbar radiculopathy    with negative MRI (1991)   Postmenopausal    with osteopenia, bisphosphonates 1/05-1/11, improved osteopenia 4/12-repeat planned late 2015   Shingles    2015   Thyroid nodule    Vitamin D deficiency    repleted on weekly vitamin D 50000 iu   Past Surgical History:  Procedure Laterality Date   ABDOMINAL HYSTERECTOMY     BREAST BIOPSY     x2   CATARACT EXTRACTION  removal with IOL bilateral   GLAUCOMA SURGERY     laser   skin cancer removal     THYROIDECTOMY     subtotal   Family History  Problem Relation Age of Onset   Breast cancer Mother    Hypertension Father    CVA Father    Heart failure Sister    CAD Sister    Kidney disease Sister    COPD Brother    Lung cancer Brother    CAD Brother    Diabetes Brother    Stroke Father    Heart attack Neg Hx    Social History:  reports that she has never smoked. She has never used  smokeless tobacco. She reports that she does not drink alcohol and does not use drugs. Allergies:  Allergies  Allergen Reactions   Codeine Nausea Only   Medications Prior to Admission  Medication Sig Dispense Refill   Cholecalciferol (VITAMIN D-3) 25 MCG (1000 UT) CAPS Take 1 capsule by mouth daily.     donepezil (ARICEPT) 5 MG tablet Take 5 mg by mouth at bedtime.     hydrochlorothiazide (HYDRODIURIL) 25 MG tablet Take 25 mg by mouth daily.      levothyroxine (SYNTHROID, LEVOTHROID) 75 MCG tablet Take 75 mcg by mouth daily before breakfast.      pyridOXINE (VITAMIN B-6) 25 MG tablet Take 25 mg by mouth daily.     sertraline (ZOLOFT) 50 MG tablet Take 100 mg by mouth daily.     verapamil (CALAN-SR) 240 MG CR tablet Take 1 tablet (240 mg total) by mouth at bedtime. 30 tablet 3   vitamin C (ASCORBIC ACID) 500 MG tablet Take 500 mg by mouth daily.     zinc gluconate 50 MG tablet Take 50 mg by mouth daily.      Drug Regimen Review Drug regimen was reviewed and remains appropriate with no significant issues identified  Home: Home Living Family/patient expects to be discharged to:: Private residence Living Arrangements: Alone Available Help at Discharge: Family, Friend(s), Available PRN/intermittently Type of Home: House Home Access: Ramped entrance Home Layout: Two level, Able to live on main level with bedroom/bathroom Bathroom Accessibility: Yes Home Equipment: Walker - 2 wheels, Shower seat, Bedside commode   Functional History: Prior Function Level of Independence: Independent with assistive device(s) Comments: Patient and family state houshold ambulation with RW, independent with basic ADL with assist to transfer into shower; family and friends assist PRN  Functional Status:  Mobility: Bed Mobility Overal bed mobility: Needs Assistance Bed Mobility: Supine to Sit, Sit to Supine Rolling: Mod assist, Min assist Supine to sit: Max assist, Mod assist Sit to supine: Max  assist, Mod assist General bed mobility comments: right extremities attempting Transfers Overall transfer level: Needs assistance Equipment used: 1 person hand held assist Transfers: Sit to/from Stand, Radiographer, therapeutic, Lateral/Scoot Transfers Sit to Stand: Max assist Anterior-Posterior transfers: Mod assist  Lateral/Scoot Transfers: Mod assist, Max assist General transfer comment: attempted sit to stand and lateral scoots to left/head of bed with left knee blocked; mod assist to perform bridging to scoot lateral to right in bed Ambulation/Gait General Gait Details: unable    ADL: ADL Overall ADL's : Needs assistance/impaired Eating/Feeding: Set up, Minimal assistance, Sitting Grooming: Moderate assistance, Minimal assistance, Sitting, Bed level Upper Body Bathing: Moderate assistance, Bed level Lower Body Bathing: Maximal assistance, Total assistance, Bed level Upper Body Dressing : Moderate assistance, Bed level Lower Body Dressing: Maximal assistance, Total assistance, Bed level Toilet Transfer: Maximal  assistance, Total assistance, Stand-pivot, RW Toileting- Clothing Manipulation and Hygiene: Total assistance Tub/ Shower Transfer: Maximal assistance, Total assistance Functional mobility during ADLs: Total assistance, Maximal assistance General ADL Comments: Pt unbale to sit up at bedside without assistance. Sit to stand not attempted due to poor postural stability seated. Clinical judgement used to determine ADL levels.  Cognition: Cognition Overall Cognitive Status: Within Functional Limits for tasks assessed Arousal/Alertness: Awake/alert Orientation Level: Disoriented to time, Disoriented to situation Attention: Sustained Sustained Attention: Impaired Sustained Attention Impairment: Verbal basic, Functional basic Memory: Impaired Memory Impairment: Retrieval deficit, Decreased recall of new information, Decreased short term memory Decreased Short Term Memory:  Verbal basic, Functional basic Immediate Memory Recall: Sock, Blue, Bed Memory Recall Sock: Not able to recall Memory Recall Blue: Not able to recall Memory Recall Bed: Not able to recall Awareness: Impaired Awareness Impairment: Anticipatory impairment Problem Solving: Impaired Problem Solving Impairment: Verbal basic, Functional basic Behaviors: Perseveration Safety/Judgment: Impaired Cognition Arousal/Alertness: Awake/alert Behavior During Therapy: WFL for tasks assessed/performed Overall Cognitive Status: Within Functional Limits for tasks assessed  Physical Exam: Blood pressure (!) 158/70, pulse 71, temperature 98 F (36.7 C), temperature source Oral, resp. rate 16, height '5\' 2"'$  (1.575 m), weight 60.4 kg, SpO2 98 %. Physical Exam Vitals and nursing note reviewed.  Constitutional:      Comments: Elderly female laying in bed- delayed responses, but joking some; frail appearing, NAD  HENT:     Head: Normocephalic and atraumatic.     Comments: Head tilt noted to R- chin to left at rest- took some coaxing to go to midline.  L nasolabial fold flatter; and L facial droop at rest, but improved with smile; tongue to L slightly.     Right Ear: External ear normal.     Left Ear: External ear normal.     Nose: Nose normal. No congestion.     Mouth/Throat:     Mouth: Mucous membranes are moist.     Pharynx: No oropharyngeal exudate.  Eyes:     General:        Right eye: No discharge.        Left eye: No discharge.     Extraocular Movements: Extraocular movements intact.  Neck:     Comments: R head tilt- chin to Left at rest- could correct Cardiovascular:     Rate and Rhythm: Normal rate and regular rhythm.     Heart sounds: Normal heart sounds. No murmur heard.   No gallop.  Pulmonary:     Comments: CTA B/L- no W/R/R- good air movement  Abdominal:     Comments: Soft, NT, ND, (+)BS - hypoactive  Genitourinary:    Comments: Using bedpan to void per pt- and said she needs to go  now Musculoskeletal:     Comments: RUE 5/5 RLE- 5-/5  LUE 0/5 LLE- HF 1/5; KE/KF 0/5, DF/PF 4-/5 Harder to extend L knee, but doesn't feel like spasticity  Skin:    General: Skin is warm and dry.     Comments: IV in L AC fossa- looks OK- a lot of bruising from needle sticks in B/L arms  Neurological:     Mental Status: She is alert.     Comments: Patient is alert.  Severely dysarthric.  Follows commands.  Provides her name but had difficulty with month. Very decreased/absent sensation to light touch in LUE; less sensation  in LLE- but better than LUE.  Intact to light touch on RUE/RLE; also decreased on L face  Psychiatric:  Comments: Slightly delayed responses, but joking some;     Results for orders placed or performed during the hospital encounter of 05/29/21 (from the past 48 hour(s))  Glucose, capillary     Status: Abnormal   Collection Time: 05/30/21 11:54 AM  Result Value Ref Range   Glucose-Capillary 138 (H) 70 - 99 mg/dL    Comment: Glucose reference range applies only to samples taken after fasting for at least 8 hours.  Glucose, capillary     Status: Abnormal   Collection Time: 05/30/21  5:09 PM  Result Value Ref Range   Glucose-Capillary 111 (H) 70 - 99 mg/dL    Comment: Glucose reference range applies only to samples taken after fasting for at least 8 hours.  Glucose, capillary     Status: Abnormal   Collection Time: 05/30/21  9:35 PM  Result Value Ref Range   Glucose-Capillary 125 (H) 70 - 99 mg/dL    Comment: Glucose reference range applies only to samples taken after fasting for at least 8 hours.  Basic metabolic panel     Status: Abnormal   Collection Time: 05/31/21  4:27 AM  Result Value Ref Range   Sodium 140 135 - 145 mmol/L   Potassium 3.0 (L) 3.5 - 5.1 mmol/L   Chloride 107 98 - 111 mmol/L   CO2 24 22 - 32 mmol/L   Glucose, Bld 118 (H) 70 - 99 mg/dL    Comment: Glucose reference range applies only to samples taken after fasting for at least 8 hours.    BUN 14 8 - 23 mg/dL   Creatinine, Ser 0.70 0.44 - 1.00 mg/dL   Calcium 8.4 (L) 8.9 - 10.3 mg/dL   GFR, Estimated >60 >60 mL/min    Comment: (NOTE) Calculated using the CKD-EPI Creatinine Equation (2021)    Anion gap 9 5 - 15    Comment: Performed at [facility], [address] [place], [place] [address]  CBC     Status: Abnormal   Collection Time: 05/31/21  4:27 AM  Result Value Ref Range   WBC 12.6 (H) 4.0 - 10.5 K/uL   RBC 4.56 3.87 - 5.11 MIL/uL   Hemoglobin 13.8 12.0 - 15.0 g/dL   HCT 40.9 36.0 - 46.0 %   MCV 89.7 80.0 - 100.0 fL   MCH 30.3 26.0 - 34.0 pg   MCHC 33.7 30.0 - 36.0 g/dL   RDW 14.5 11.5 - 15.5 %   Platelets 269 150 - 400 K/uL   nRBC 0.0 0.0 - 0.2 %    Comment: Performed at [facility], [address], [place], [place] [address]  Glucose, capillary     Status: Abnormal   Collection Time: 05/31/21  7:31 AM  Result Value Ref Range   Glucose-Capillary 124 (H) 70 - 99 mg/dL    Comment: Glucose reference range applies only to samples taken after fasting for at least 8 hours.  Glucose, capillary     Status: Abnormal   Collection Time: 05/31/21 11:45 AM  Result Value Ref Range   Glucose-Capillary 106 (H) 70 - 99 mg/dL    Comment: Glucose reference range applies only to samples taken after fasting for at least 8 hours.  Glucose, capillary     Status: Abnormal   Collection Time: 05/31/21  5:26 PM  Result Value Ref Range   Glucose-Capillary 128 (H) 70 - 99 mg/dL    Comment: Glucose reference range applies only to samples taken after fasting for at least 8 hours.  Glucose,  capillary     Status: Abnormal   Collection Time: 06/01/21 12:15 AM  Result Value Ref Range   Glucose-Capillary 135 (H) 70 - 99 mg/dL    Comment: Glucose reference range applies only to samples taken after fasting for at least 8 hours.  CBC     Status: None   Collection Time: 06/01/21  4:56 AM  Result Value Ref Range   WBC 9.0 4.0 - 10.5 K/uL   RBC 4.14 3.87 - 5.11 MIL/uL   Hemoglobin 12.9  12.0 - 15.0 g/dL   HCT 37.4 36.0 - 46.0 %   MCV 90.3 80.0 - 100.0 fL   MCH 31.2 26.0 - 34.0 pg   MCHC 34.5 30.0 - 36.0 g/dL   RDW 14.4 11.5 - 15.5 %   Platelets 240 150 - 400 K/uL   nRBC 0.0 0.0 - 0.2 %    Comment: Performed at [facility], [address] [place], [place] [id number]  Basic metabolic panel     Status: Abnormal   Collection Time: 06/01/21  4:56 AM  Result Value Ref Range   Sodium 138 135 - 145 mmol/L   Potassium 3.1 (L) 3.5 - 5.1 mmol/L   Chloride 108 98 - 111 mmol/L   CO2 26 22 - 32 mmol/L   Glucose, Bld 121 (H) 70 - 99 mg/dL    Comment: Glucose reference range applies only to samples taken after fasting for at least 8 hours.   BUN 16 8 - 23 mg/dL   Creatinine, Ser 0.77 0.44 - 1.00 mg/dL   Calcium 8.2 (L) 8.9 - 10.3 mg/dL   GFR, Estimated >60 >60 mL/min    Comment: (NOTE) Calculated using the CKD-EPI Creatinine Equation (2021)    Anion gap 4 (L) 5 - 15    Comment: Performed at [facility], [address], [place], [place] [address]  Glucose, capillary     Status: Abnormal   Collection Time: 06/01/21  6:35 AM  Result Value Ref Range   Glucose-Capillary 122 (H) 70 - 99 mg/dL    Comment: Glucose reference range applies only to samples taken after fasting for at least 8 hours.   DG Swallowing Func-Speech Pathology  Result Date: 05/30/2021 Formatting of this result is different from the original. Objective Swallowing Evaluation: Type of Study: MBS-Modified Barium Swallow Study  Patient Details Name: SHELLIE LUCCA MRN: [id number] Date of Birth: 09-30-1931 Today's Date: 05/30/2021 Time: SLP Start Time (ACUTE ONLY): 1015 -SLP Stop Time (ACUTE ONLY): [id number] SLP Time Calculation (min) (ACUTE ONLY): 21 min Past Medical History: Past Medical History: Diagnosis Date  Decreased sense of smell   and taste-negative CT scan-2011  Depression   Essential hypertension   GERD (gastroesophageal reflux disease)   Heart murmur   heard first time 4/12  History of mammogram   2010 and she does not  want to repeat them anymore  HOCM (hypertrophic obstructive cardiomyopathy) ([place])   a. Dx by echo 05/2014.  Hypothyroidism   IBS (irritable bowel syndrome)   Impaired fasting glucose   Low back pain   Lumbar radiculopathy   with negative MRI (1991)  Postmenopausal   with osteopenia, bisphosphonates 1/05-1/11, improved osteopenia 4/12-repeat planned late 2015  Shingles   2015  Thyroid nodule   Vitamin D deficiency   repleted on weekly vitamin D 50000 iu Past Surgical History: Past Surgical History: Procedure Laterality Date  ABDOMINAL HYSTERECTOMY    BREAST BIOPSY    x2  CATARACT EXTRACTION    removal with IOL  bilateral  GLAUCOMA SURGERY    laser  skin cancer removal    THYROIDECTOMY    subtotal HPI: NATIYA WEXLER is a 87 y.o. female with medical history significant of essential hypertension, hypothyroidism, gastroesophageal reflux disease, depression, vitamin D deficiency, history of HOCM and chronic low back pain; who presented to the hospital secondary to left-sided weakness and dysarthria. Patient was found on the floor demonstrating left-sided weakness, left lateralized gaze, vomiting content next to her and also dysarthria. MRI reveals "Acute right pontine infarct. Mild associated edema without mass effect. Multiple small remote bilateral cerebellar lacunar infarcts." Pt failed [place] stroke swallow, BSE requested.  No data recorded Assessment / Plan / Recommendation CHL IP CLINICAL IMPRESSIONS 05/30/2021 Clinical Impression Pt presents with mild/moderate sensorimotor oropharyngeal dysphagia characterized by silent and sensed aspiration of thin liquids. Pt demonstrates decreased laryngeal vestibule closure with cup sips of thin liquid resulting in penetration to the cords during the swallow that is not sensed and trace amounts typically fall below the cords after the swallow (aspiration is sensed ~50% of the time). Decreased penetration was noted with straw sips of thin liquids, however, trace amounts still fall  to the cords and were likely eventually aspirated. Chin tuck was very effective in minimizing/eliminating penetration during the swallow, which also minimized/eliminated aspiration after the swallow. Minimal/occasional flash/shallow penetration visualized with NTL trials with NO aspiration. Puree textures were consumed withouth incident, note as multiple trials went on Pt with mild valleculae residue required a verbal cue for repeat dry swallow. With regular textures Pt demonstrated prolonged oral prep stage, reporting biting her L cheeck during mastication, poor awareness of bolus at times (resulting in holding) and mild/mod oral residue after the swallow. Pt swallowed barium tablet without incident. Recommend initiate conservative diet of D1/puree and NECTAR thick liquids with initiation of skilled ST to implement strategies for oral dysphagia and trials of thin liquids with chin tuck -- further, permit free water (water only after oral care). Despite the fact that chin tuck was effective in minimizing/eliminating penetration/aspiration during the swallow Pt is still at high risk for aspiration with thin liquids secondary to changes in mentation post this acute CVA resulting in talking during PO despite cues not to, suspected memory deficit (?ability to consistently utilize chin tuck), and mild pharyngeal residue post swallow could still be silently aspirated. Puree is recommended secondary to poor oral control and awareness of new decreased labial/lingual ROM and strength. Pt has good potential for diet upgrade to thin/soft textures with ST therapy and as awareness and mentation hopefully improve. Above reviewed with Pt and Pt's daughters. SLP Visit Diagnosis Dysphagia, oropharyngeal phase (R13.12) Attention and concentration deficit following -- Frontal lobe and executive function deficit following -- Impact on safety and function Mild aspiration risk;Moderate aspiration risk   CHL IP TREATMENT RECOMMENDATION  05/30/2021 Treatment Recommendations Therapy as outlined in treatment plan below   Prognosis 05/30/2021 Prognosis for Safe Diet Advancement Good Barriers to Reach Goals Cognitive deficits Barriers/Prognosis Comment -- CHL IP DIET RECOMMENDATION 05/30/2021 SLP Diet Recommendations Dysphagia 1 (Puree) solids;Nectar thick liquid Liquid Administration via Cup Medication Administration Whole meds with liquid Compensations Minimize environmental distractions;Slow rate;Small sips/bites Postural Changes Remain semi-upright after after feeds/meals (Comment);Seated upright at 90 degrees   CHL IP OTHER RECOMMENDATIONS 05/30/2021 Recommended Consults -- Oral Care Recommendations Oral care BID Other Recommendations Remove water pitcher;Order thickener from pharmacy;Prohibited food (jello, ice cream, thin soups)   CHL IP FOLLOW UP RECOMMENDATIONS 05/30/2021 Follow up Recommendations Skilled Nursing facility;24 hour supervision/assistance  CHL IP FREQUENCY AND DURATION 05/30/2021 Speech Therapy Frequency (ACUTE ONLY) min 2x/week Treatment Duration 1 week      CHL IP ORAL PHASE 05/30/2021 Oral Phase Impaired Oral - Pudding Teaspoon -- Oral - Pudding Cup -- Oral - Honey Teaspoon -- Oral - Honey Cup -- Oral - Nectar Teaspoon NT Oral - Nectar Cup Decreased bolus cohesion;Lingual pumping Oral - Nectar Straw NT Oral - Thin Teaspoon Premature spillage;Decreased bolus cohesion;Piecemeal swallowing;Lingual/palatal residue;Lingual pumping;Weak lingual manipulation Oral - Thin Cup Premature spillage;Decreased bolus cohesion;Piecemeal swallowing;Lingual/palatal residue;Lingual pumping;Weak lingual manipulation Oral - Thin Straw Premature spillage;Decreased bolus cohesion;Piecemeal swallowing;Lingual/palatal residue;Lingual pumping;Weak lingual manipulation Oral - Puree Decreased bolus cohesion;Delayed oral transit;Lingual pumping;Weak lingual manipulation;Lingual/palatal residue;Piecemeal swallowing Oral - Mech Soft NT Oral - Regular Decreased bolus  cohesion;Delayed oral transit;Lingual pumping;Weak lingual manipulation;Lingual/palatal residue;Piecemeal swallowing Oral - Multi-Consistency NT Oral - Pill WFL Oral Phase - Comment --  CHL IP PHARYNGEAL PHASE 05/30/2021 Pharyngeal Phase Impaired Pharyngeal- Pudding Teaspoon -- Pharyngeal -- Pharyngeal- Pudding Cup -- Pharyngeal -- Pharyngeal- Honey Teaspoon -- Pharyngeal -- Pharyngeal- Honey Cup -- Pharyngeal -- Pharyngeal- Nectar Teaspoon NT Pharyngeal -- Pharyngeal- Nectar Cup Penetration/Aspiration during swallow;Pharyngeal residue - pyriform;Pharyngeal residue - valleculae Pharyngeal Material enters airway, remains ABOVE vocal cords then ejected out Pharyngeal- Nectar Straw NT Pharyngeal -- Pharyngeal- Thin Teaspoon Delayed swallow initiation-pyriform sinuses;Penetration/Aspiration during swallow;Trace aspiration;Pharyngeal residue - valleculae;Pharyngeal residue - pyriform Pharyngeal Material enters airway, remains ABOVE vocal cords then ejected out Pharyngeal- Thin Cup Delayed swallow initiation-pyriform sinuses;Penetration/Aspiration during swallow;Trace aspiration;Pharyngeal residue - valleculae;Pharyngeal residue - pyriform Pharyngeal Material enters airway, passes BELOW cords and not ejected out despite cough attempt by patient Pharyngeal- Thin Straw Delayed swallow initiation-pyriform sinuses;Penetration/Aspiration during swallow;Pharyngeal residue - valleculae;Pharyngeal residue - pyriform Pharyngeal Material enters airway, remains ABOVE vocal cords then ejected out Pharyngeal- Puree Pharyngeal residue - valleculae Pharyngeal -- Pharyngeal- Mechanical Soft NT Pharyngeal -- Pharyngeal- Regular Pharyngeal residue - valleculae Pharyngeal -- Pharyngeal- Multi-consistency NT Pharyngeal -- Pharyngeal- Pill WFL Pharyngeal -- Pharyngeal Comment --  CHL IP CERVICAL ESOPHAGEAL PHASE 05/30/2021 Cervical Esophageal Phase WFL Pudding Teaspoon -- Pudding Cup -- Honey Teaspoon -- Honey Cup -- Nectar Teaspoon -- Nectar Cup  -- Nectar Straw -- Thin Teaspoon -- Thin Cup -- Thin Straw -- Puree -- Mechanical Soft -- Regular -- Multi-consistency -- Pill -- Cervical Esophageal Comment -- Amelia H. Roddie Mc, CCC-SLP Speech Language Pathologist Wende Bushy 05/30/2021, 11:41 AM              EEG adult  Result Date: 05/30/2021 Lora Havens, MD     05/30/2021  5:27 PM Patient Name: ROSETTA NEVERSON MRN: [id number] Epilepsy Attending: Lora Havens Referring Physician/Provider: Dr Barton Dubois Date: 05/30/2021 Duration: 24.27 mins Patient history: 87yo F with left sided weakness and dysarthria. EEG to evaluate for seizure. Level of alertness: Awake, asleep AEDs during EEG study: Technical aspects: This EEG study was done with scalp electrodes positioned according to the 10-20 International system of electrode placement. Electrical activity was acquired at a sampling rate of '500Hz'$  and reviewed with a high frequency filter of '70Hz'$  and a low frequency filter of '1Hz'$ . EEG data were recorded continuously and digitally stored. Description: The posterior dominant rhythm consists of 6 Hz activity of moderate voltage (25-35 uV) seen predominantly in posterior head regions, symmetric and reactive to eye opening and eye closing. Sleep was characterized by vertex waves, sleep spindles (12 to 14 Hz), maximal frontocentral region. Hyperventilation and photic stimulation were not performed.   ABNORMALITY - Background slow  IMPRESSION: This study is suggestive of mild diffuse encephalopathy, nonspecific etiology. No seizures or epileptiform discharges were seen throughout the recording. Lora Havens       Medical Problem List and Plan: 1.   Left-sided weakness with dysarthria/dysphagia secondary to right pontine infarction as well as multiple small remote bilateral cerebellar lacunar infarcts  -patient may  shower  -ELOS/Goals: 21-24 days min Assist 2.  Antithrombotics: -DVT/anticoagulation: Lovenox Pharmaceutical:  Lovenox  -antiplatelet therapy: Aspirin 81 mg daily and Plavix 75 mg day x1 month then Plavix alone 3. Pain Management/chronic back pain: Tylenol as needed 4. Mood: Zoloft 100 mg daily, Aricept 5 mg nightly  -antipsychotic agents: N/A 5. Neuropsych: This patient is capable of making decisions on her own behalf. 6. Skin/Wound Care: Routine skin checks 7. Fluids/Electrolytes/Nutrition: Routine in and outs with follow-up chemistries 8.  Dysphagia.  Dysphagia #1 nectar thick liquids.  Follow-up speech therapy.  Monitor hydration 9.  Permissive hypertension.  Patient on verapamil 240 mg daily prior to admission as well as HCTZ 25 mg daily.  Resume as needed 10.  Hyperlipidemia.  Lipitor 11.  Hypothyroidism.  TSH 2.093 .Continue Synthroid 12.  Constipation.  MiraLAX scheduled daily.  Senokot S2 tablets nightly patient did receive Dulcolax suppository 06/01/2021- per pt, LBM 2 days ago.     Lavon Paganini Angiulli, PA-C 06/01/2021   I have personally performed a face to face diagnostic evaluation of this patient and formulated the key components of the plan.  Additionally, I have personally reviewed laboratory data, imaging studies, as well as relevant notes and concur with the physician assistant's documentation above.   The patient's status has not changed from the original H&P.  Any changes in documentation from the acute care chart have been noted above.

## 2021-06-01 NOTE — Progress Notes (Signed)
Inpatient Rehab Admissions Coordinator:   I have a bed available for pt to admit to CIR today. Dr. Denton Brick in agreement. I will let pt/family and TOC team know. I will set up [facility] transport.  [facility].  RN station is [phone] for report.   Shann Medal, PT, DPT Admissions Coordinator [phone] 06/01/21  11:02 AM

## 2021-06-01 NOTE — Progress Notes (Signed)
Inpatient Rehabilitation Medication Review by a Pharmacist  A complete drug regimen review was completed for this patient to identify any potential clinically significant medication issues.  Clinically significant medication issues were identified:  No  Check AMION for pharmacist assigned to patient if future medication questions/issues arise during this admission.  Time spent performing this drug regimen review (minutes):  15  Gillermina Hu, PharmD, BCPS, [facility] Clinical Pharmacist 06/01/2021 2:22 PM

## 2021-06-01 NOTE — Progress Notes (Signed)
PMR Admission Coordinator Pre-Admission Assessment   Patient: Vickie Mckee is an 87 y.o., female MRN: [id number] DOB: 1931-09-08 Height: '5\' 2"'$  (157.5 cm) Weight: 60.4 kg   Insurance Information HMO:     PPO:      PCP:      IPA:      80/20:      OTHER:  PRIMARY: Medicare A/B      Policy#: [id number]      Subscriber: pt CM Name:       Phone#:      Fax#:  Pre-Cert#: verified online      Employer:  Benefits:  Phone #:      Name:  Eff. Date: 05/22/98 A/B     Deduct: $1556      Out of Pocket Max: n/a      Life Max: n/a CIR: 100%      SNF: 20 full days  Outpatient: 80%     Co-Pay: 20% Home Health: 100%      Co-Pay:  DME: 80%     Co-Pay: 20% Providers:  SECONDARYLynda Rainwater      Policy#: [id number]     Phone#: [phone]   Financial Counselor:       Phone#:    The "Data Collection Information Summary" for patients in Inpatient Rehabilitation Facilities with attached "Privacy Act [facility] Records" was provided and verbally reviewed with: Family   Emergency Contact Information Contact Information       Name Relation Home Work [place] Daughter [phone]   [phone]    Eulogio Ditch Daughter     [phone]           Current Medical History  Patient Admitting Diagnosis: R pontine CVA   History of Present Illness: Vickie Mckee is an 87 year old right-handed female with history of hypertension hypothyroidism GERD chronic low back pain, hypertrophic obstructive cardiomyopathy, some memory loss maintained on Aricept.   Presented to [facility]/29/2021 with acute onset of left-sided weakness and dysarthria as well as left lateral gaze and 1 episode of vomiting.  Cranial CT scan negative for acute changes.  CT angiogram of head and neck showed no large vessel occlusion or high-grade stenosis.  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  1 mm aneurysm arising from the anterior communicating  artery.  Patient did not receive tPA.  MRI showed acute right pontine infarction.  Multiple small remote bilateral cerebellar lacunar infarcts.  Echocardiogram with ejection fraction of 60 to 65% no wall motion abnormalities.  EEG negative for seizure.  Admission chemistries alcohol negative, potassium 2.6, glucose 137, urine drug screen negative, hemoglobin A1c 5.7.  Currently maintained on aspirin 81 mg daily and Plavix 75 mg daily for CVA prophylaxis x1 month then antiplatelet monotherapy.  Subcutaneous Lovenox for DVT prophylaxis.  Dysphagia #1 nectar thick liquid diet.  Therapy evaluations completed due to patient's left-sided weakness dysarthria/dysphagia was recommended for a comprehensive rehab program.   Complete NIHSS TOTAL: 10   Patient's medical record from [facility] has been reviewed by the rehabilitation admission coordinator and physician.   Past Medical History      Past Medical History:  Diagnosis Date   Decreased sense of smell      and taste-negative CT scan-2011   Depression     Essential hypertension     GERD (gastroesophageal reflux disease)     Heart murmur      heard first  time 4/12   History of mammogram      2010 and she does not want to repeat them anymore   HOCM (hypertrophic obstructive cardiomyopathy) ([place])      a. Dx by echo 05/2014.   Hypothyroidism     IBS (irritable bowel syndrome)     Impaired fasting glucose     Low back pain     Lumbar radiculopathy      with negative MRI (1991)   Postmenopausal      with osteopenia, bisphosphonates 1/05-1/11, improved osteopenia 4/12-repeat planned late 2015   Shingles      2015   Thyroid nodule     Vitamin D deficiency      repleted on weekly vitamin D 50000 iu      Family History   family history includes Breast cancer in her mother; CAD in her brother and sister; COPD in her brother; CVA in her father; Diabetes in her brother; Heart failure in her sister; Hypertension in her father; Kidney disease in her  sister; Lung cancer in her brother; Stroke in her father.   Prior Rehab/Hospitalizations Has the patient had prior rehab or hospitalizations prior to admission? No   Has the patient had major surgery during 100 days prior to admission? No               Current Medications   Current Facility-Administered Medications:    acetaminophen (TYLENOL) tablet 650 mg, 650 mg, Oral, Q4H PRN, 650 mg at 05/31/21 1321 **OR** acetaminophen (TYLENOL) 160 MG/5ML solution 650 mg, 650 mg, Per Tube, Q4H PRN **OR** acetaminophen (TYLENOL) suppository 650 mg, 650 mg, Rectal, Q4H PRN, Barton Dubois, MD   ascorbic acid (VITAMIN C) tablet 500 mg, 500 mg, Oral, Daily, Barton Dubois, MD, 500 mg at 06/01/21 1055   [START ON 06/02/2021] aspirin EC tablet 81 mg, 81 mg, Oral, Q breakfast, Emokpae, Courage, MD   aspirin tablet 325 mg, 325 mg, Oral, Daily, Emokpae, Courage, MD, 325 mg at 06/01/21 1055   atorvastatin (LIPITOR) tablet 40 mg, 40 mg, Oral, Daily, Emokpae, Courage, MD, 40 mg at 06/01/21 1055   bisacodyl (DULCOLAX) suppository 10 mg, 10 mg, Rectal, Once, Emokpae, Courage, MD   clopidogrel (PLAVIX) tablet 75 mg, 75 mg, Oral, Daily, Kerney Elbe, MD, 75 mg at 06/01/21 1055   dextrose 5 % and 0.45 % NaCl with KCl 40 mEq/L infusion, , Intravenous, Continuous, Emokpae, Courage, MD, Last Rate: 40 mL/hr at 06/01/21 1058, Rate Change at 06/01/21 1058   donepezil (ARICEPT) tablet 5 mg, 5 mg, Oral, QHS, Barton Dubois, MD, 5 mg at 05/31/21 2146   enoxaparin (LOVENOX) injection 40 mg, 40 mg, Subcutaneous, Q24H, Barton Dubois, MD, 40 mg at 05/31/21 1627   hydrALAZINE (APRESOLINE) injection 10 mg, 10 mg, Intravenous, Q8H PRN, Barton Dubois, MD   levothyroxine (SYNTHROID) tablet 75 mcg, 75 mcg, Oral, QAC breakfast, Barton Dubois, MD, 75 mcg at 06/01/21 0536   pantoprazole (PROTONIX) EC tablet 40 mg, 40 mg, Oral, Daily, Barton Dubois, MD, 40 mg at 06/01/21 1055   polyethylene glycol (MIRALAX / GLYCOLAX) packet 17 g, 17 g,  Oral, Daily, Emokpae, Courage, MD   potassium chloride SA (KLOR-CON) CR tablet 40 mEq, 40 mEq, Oral, Q3H, Emokpae, Courage, MD, 40 mEq at 06/01/21 1055   senna-docusate (Senokot-S) tablet 2 tablet, 2 tablet, Oral, QHS, Emokpae, Courage, MD   sertraline (ZOLOFT) tablet 100 mg, 100 mg, Oral, Daily, Barton Dubois, MD, 100 mg at 06/01/21 1054   zinc sulfate capsule 220 mg,  220 mg, Oral, Daily, Barton Dubois, MD, 220 mg at 06/01/21 1055   Patients Current Diet:  Diet Order                  DIET - DYS 1 Room service appropriate? Yes; Fluid consistency: Nectar Thick  Diet effective now                         Precautions / Restrictions Precautions Precautions: Fall Restrictions Weight Bearing Restrictions: No    Has the patient had 2 or more falls or a fall with injury in the past year? Yes   Prior Activity Level Limited Community (1-2x/wk): independent with RW, family completes IADLs only.  Pt does not drive. Recently widowed.   Prior Functional Level Self Care: Did the patient need help bathing, dressing, using the toilet or eating? Independent   Indoor Mobility: Did the patient need assistance with walking from room to room (with or without device)? Independent   Stairs: Did the patient need assistance with internal or external stairs (with or without device)? Independent   Functional Cognition: Did the patient need help planning regular tasks such as shopping or remembering to take medications? Needed some help   Home Assistive Devices / Equipment Home Assistive Devices/Equipment: Other (Comment) Home Equipment: Walker - 2 wheels, Shower seat, Bedside commode   Prior Device Use: Indicate devices/aids used by the patient prior to current illness, exacerbation or injury? Walker   Current Functional Level Cognition   Arousal/Alertness: Awake/alert Overall Cognitive Status: Within Functional Limits for tasks assessed Orientation Level: Disoriented to time, Disoriented to  situation Attention: Sustained Sustained Attention: Impaired Sustained Attention Impairment: Verbal basic, Functional basic Memory: Impaired Memory Impairment: Retrieval deficit, Decreased recall of new information, Decreased short term memory Decreased Short Term Memory: Verbal basic, Functional basic Awareness: Impaired Awareness Impairment: Anticipatory impairment Problem Solving: Impaired Problem Solving Impairment: Verbal basic, Functional basic Behaviors: Perseveration Safety/Judgment: Impaired    Extremity Assessment (includes Sensation/Coordination)   Upper Extremity Assessment: RUE deficits/detail, LUE deficits/detail RUE Deficits / Details: generalized weakness RUE Coordination: WNL LUE Deficits / Details: Flaccid L UE. No trace movement. Pt reported increased sensitivity to light touch. LUE Sensation: decreased proprioception LUE Coordination: decreased fine motor, decreased gross motor  Lower Extremity Assessment: Defer to PT evaluation LLE Deficits / Details: LLE flaccid with spontanous L ankle DF, no spasticity LLE; grossly tender and hypertonic L hip adductors likely due to strain with falling LLE Sensation: WNL     ADLs   Overall ADL's : Needs assistance/impaired Eating/Feeding: Set up, Minimal assistance, Sitting Grooming: Moderate assistance, Minimal assistance, Sitting, Bed level Upper Body Bathing: Moderate assistance, Bed level Lower Body Bathing: Maximal assistance, Total assistance, Bed level Upper Body Dressing : Moderate assistance, Bed level Lower Body Dressing: Maximal assistance, Total assistance, Bed level Toilet Transfer: Maximal assistance, Total assistance, Stand-pivot, RW Toileting- Clothing Manipulation and Hygiene: Total assistance Tub/ Shower Transfer: Maximal assistance, Total assistance Functional mobility during ADLs: Total assistance, Maximal assistance General ADL Comments: Pt unbale to sit up at bedside without assistance. Sit to stand  not attempted due to poor postural stability seated. Clinical judgement used to determine ADL levels.     Mobility   Overal bed mobility: Needs Assistance Bed Mobility: Supine to Sit, Sit to Supine Rolling: Mod assist, Min assist Supine to sit: Max assist, Mod assist Sit to supine: Max assist, Mod assist General bed mobility comments: right extremities attempting     Transfers  Overall transfer level: Needs assistance Equipment used: 1 person hand held assist Transfers: Sit to/from Stand, Radiographer, therapeutic, Lateral/Scoot Transfers Sit to Stand: Max assist Anterior-Posterior transfers: Mod assist  Lateral/Scoot Transfers: Mod assist, Max assist General transfer comment: attempted sit to stand and lateral scoots to left/head of bed with left knee blocked; mod assist to perform bridging to scoot lateral to right in bed     Ambulation / Gait / Stairs / Wheelchair Mobility   Ambulation/Gait General Gait Details: unable     Posture / Balance Dynamic Sitting Balance Sitting balance - Comments: Unable to remain sitting at EOB without additional physical assist. Balance Overall balance assessment: Needs assistance Sitting-balance support: Single extremity supported, Feet unsupported, Feet supported Sitting balance-Leahy Scale: Poor Sitting balance - Comments: Unable to remain sitting at EOB without additional physical assist. Postural control: Left lateral lean Standing balance support: Single extremity supported, During functional activity Standing balance-Leahy Scale: Zero     Special needs/care consideration N/a    Previous Home Environment (from acute therapy documentation) Living Arrangements: Alone Available Help at Discharge: Family, Friend(s), Available PRN/intermittently Type of Home: House Home Layout: Two level, Able to live on main level with bedroom/bathroom Home Access: Ramped entrance Bathroom Accessibility: Yes [place]: No   Discharge Living  Setting Plans for Discharge Living Setting: Patient's home, Lives with (comment) (patient's daughters will rotate staying with her) Type of Home at Discharge: House Discharge Home Layout: Able to live on main level with bedroom/bathroom Discharge Home Access: [place] entrance Discharge Bathroom Shower/Tub: Tub/shower unit Discharge Bathroom Toilet: Standard Discharge Bathroom Accessibility: Yes How Accessible: Accessible via walker Does the patient have any problems obtaining your medications?: No   Social/Family/Support Systems Anticipated Caregiver: daughters, Shelva Majestic and Eulogio Ditch Anticipated Caregiver's Contact Information: Butch Penny [phone]; Tomi Bamberger [phone] Ability/Limitations of Caregiver: min asisst Caregiver Availability: 24/7 Discharge Plan Discussed with Primary Caregiver: Yes Is Caregiver In Agreement with Plan?: Yes Does Caregiver/Family have Issues with Lodging/Transportation while Pt is in Rehab?: No   Goals Patient/Family Goal for Rehab: PT/OT min assist, SLP supervision Expected length of stay: 21-24 days Pt/Family Agrees to Admission and willing to participate: Yes Program Orientation Provided & Reviewed with Pt/Caregiver Including Roles  & Responsibilities: Yes   Decrease burden of Care through IP rehab admission: n/a   Possible need for SNF placement upon discharge: Potentially   Patient Condition: I have reviewed medical records from [facility], spoken with CSW, and daughter. I discussed via phone for inpatient rehabilitation assessment.  Patient will benefit from ongoing PT, OT, and SLP, can actively participate in 3 hours of therapy a day 5 days of the week, and can make measurable gains during the admission.  Patient will also benefit from the coordinated team approach during an Inpatient Acute Rehabilitation admission.  The patient will receive intensive therapy as well as Rehabilitation physician, nursing, social worker, and care management  interventions.  Due to bladder management, bowel management, safety, skin/wound care, disease management, medication administration, pain management, and patient education the patient requires 24 hour a day rehabilitation nursing.  The patient is currently mod to max +2 with mobility and basic ADLs.  Discharge setting and therapy post discharge at home with home health is anticipated.  Patient has agreed to participate in the Acute Inpatient Rehabilitation Program and will admit today.   Preadmission Screen Completed By:  Michel Santee, PT, DPT 06/01/2021 11:08 AM ______________________________________________________________________   Discussed status with Dr. Dagoberto Ligas on 06/01/21  at 11:16 AM  and received approval for admission today.   Admission Coordinator:  Michel Santee, PT, DPT, time 11:17 AM Sudie Grumbling 06/01/21     Assessment/Plan: Diagnosis: Does the need for close, 24 hr/day Medical supervision in concert with the patient's rehab needs make it unreasonable for this patient to be served in a less intensive setting? Yes Co-Morbidities requiring supervision/potential complications: L hemiparesis, dysphagia, dysarthria, R pntine infarct, hypokalemia, HTN Due to bladder management, bowel management, safety, skin/wound care, disease management, medication administration, and patient education, does the patient require 24 hr/day rehab nursing? Yes Does the patient require coordinated care of a physician, rehab nurse, PT, OT, and SLP to address physical and functional deficits in the context of the above medical diagnosis(es)? Yes Addressing deficits in the following areas: balance, endurance, locomotion, strength, transferring, bowel/bladder control, bathing, dressing, feeding, grooming, toileting, speech, language, and swallowing Can the patient actively participate in an intensive therapy program of at least 3 hrs of therapy 5 days a week? Yes The potential for patient to make measurable gains  while on inpatient rehab is good and fair Anticipated functional outcomes upon discharge from inpatient rehab: supervision and min assist PT, supervision and min assist OT, supervision SLP Estimated rehab length of stay to reach the above functional goals is: 21-24 days Anticipated discharge destination: Home 10. Overall Rehab/Functional Prognosis: good and fair     MD Signature:

## 2021-06-01 NOTE — Discharge Instructions (Signed)
1)Please take Aspirin 81 mg daily along with Plavix 75 mg daily for 30 days then after that STOP the aspirin and continue ONLY Plavix 75 mg daily indefinitely--for stroke prevention  2)Avoid ibuprofen/Advil/Aleve/Motrin/Goody Powders/Naproxen/BC powders/Meloxicam/Diclofenac/Indomethacin and other Nonsteroidal anti-inflammatory medications as these will make you more likely to bleed and can cause stomach ulcers, can also cause Kidney problems.

## 2021-06-01 NOTE — Discharge Instructions (Addendum)
Inpatient Rehab Discharge Instructions  [name] Discharge date and time: No discharge date for patient encounter.   Activities/Precautions/ Functional Status: Activity: activity as tolerated Diet:  Wound Care: Routine skin checks Functional status:  ___ No restrictions     ___ Walk up steps independently ___ 24/7 supervision/assistance   ___ Walk up steps with assistance ___ Intermittent supervision/assistance  ___ Bathe/dress independently ___ Walk with walker     __x_ Bathe/dress with assistance ___ Walk Independently    ___ Shower independently ___ Walk with assistance    ___ Shower with assistance ___ No alcohol     ___ Return to work/school ________  Special Instructions: No driving smoking or alcohol  Continue aspirin 81 mg daily and Plavix 75 mg daily x1 month and Plavix aloneSTROKE/TIA DISCHARGE INSTRUCTIONS SMOKING Cigarette smoking nearly doubles your risk of having a stroke & is the single most alterable risk factor  If you smoke or have smoked in the last 12 months, you are advised to quit smoking for your health. Most of the excess cardiovascular risk related to smoking disappears within a year of stopping. Ask you doctor about anti-smoking medications [facility] Quit Line: 1-800-QUIT NOW Free Smoking Cessation Classes [phone]  CHOLESTEROL Know your levels; limit fat & cholesterol in your diet  Lipid Panel     Component Value Date/Time   CHOL 203 (H) 05/30/2021 0351   TRIG 196 (H) 05/30/2021 0351   HDL 37 (L) 05/30/2021 0351   CHOLHDL 5.5 05/30/2021 0351   VLDL 39 05/30/2021 0351   LDLCALC 127 (H) 05/30/2021 0351     Many patients benefit from treatment even if their cholesterol is at goal. Goal: Total Cholesterol (CHOL) less than 160 Goal:  Triglycerides (TRIG) less than 150 Goal:  HDL greater than 40 Goal:  LDL (LDLCALC) less than 100   BLOOD PRESSURE American Stroke Association blood pressure target is less that 120/80 mm/Hg  Your discharge blood  pressure is:    Monitor your blood pressure Limit your salt and alcohol intake Many individuals will require more than one medication for high blood pressure  DIABETES (A1c is a blood sugar average for last 3 months) Goal HGBA1c is under 7% (HBGA1c is blood sugar average for last 3 months)  Diabetes: No known diagnosis of diabetes    Lab Results  Component Value Date   HGBA1C 5.7 (H) 05/30/2021    Your HGBA1c can be lowered with medications, healthy diet, and exercise. Check your blood sugar as directed by your physician Call your physician if you experience unexplained or low blood sugars.  PHYSICAL ACTIVITY/REHABILITATION Goal is 30 minutes at least 4 days per week  Activity: Increase activity slowly, Therapies: Physical Therapy: Home Health Return to work:  Activity decreases your risk of heart attack and stroke and makes your heart stronger.  It helps control your weight and blood pressure; helps you relax and can improve your mood. Participate in a regular exercise program. Talk with your doctor about the best form of exercise for you (dancing, walking, swimming, cycling).  DIET/WEIGHT Goal is to maintain a healthy weight  Your discharge diet is:  Diet Order     None       liquids Your height is:    Your current weight is:   Your Body Mass Index (BMI) is:    Following the type of diet specifically designed for you will help prevent another stroke. Your goal weight range is:   Your goal Body Mass Index (BMI)  is 19-24. Healthy food habits can help reduce 3 risk factors for stroke:  High cholesterol, hypertension, and excess weight.  RESOURCES Stroke/Support Group:  Call [phone]   STROKE EDUCATION PROVIDED/REVIEWED AND GIVEN TO PATIENT Stroke warning signs and symptoms How to activate emergency medical system (call 911). Medications prescribed at discharge. Need for follow-up after discharge. Personal risk factors for stroke. Pneumonia vaccine given: No Flu vaccine  given: No My questions have been answered, the writing is legible, and I understand these instructions.  I will adhere to these goals & educational materials that have been provided to me after my discharge from the hospital.       My questions have been answered and I understand these instructions. I will adhere to these goals and the provided educational materials after my discharge from the hospital.  Patient/Caregiver Signature _______________________________ Date __________  Clinician Signature _______________________________________ Date __________  Please bring this form and your medication list with you to all your follow-up doctor's appointments.

## 2021-06-01 NOTE — H&P (Signed)
Physical Medicine and Rehabilitation Admission H&P        Chief Complaint  Patient presents with   Code Stroke  : HPI: Vickie Mckee is an 87 year old right-handed female with history of hypertension hypothyroidism GERD chronic low back pain, hypertrophic obstructive cardiomyopathy, some memory loss maintained on Aricept.  Per chart review patient lives alone independent with assistive device.  Two-level home bed and bath main level with a ramped entrance.  She has family and friends to assist her as needed.  Presented to [facility] 05/29/2021 with acute onset of left-sided weakness and dysarthria as well as left lateral gaze and 1 episode of vomiting.  Cranial CT scan negative for acute changes.  CT angiogram of head and neck showed no large vessel occlusion or high-grade stenosis.  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  Patient did not receive tPA.  MRI showed acute right pontine infarction.  Multiple small remote bilateral cerebellar lacunar infarcts.  Echocardiogram with ejection fraction of 60 to 65% no wall motion abnormalities.  EEG negative for seizure.  Admission chemistries alcohol negative, potassium 2.6, glucose 137, urine drug screen negative, hemoglobin A1c 5.7.  Currently maintained on aspirin 81 mg daily and Plavix 75 mg daily for CVA prophylaxis x1 month then antiplatelet monotherapy.  Subcutaneous Lovenox for DVT prophylaxis.  Dysphagia #1 nectar thick liquid diet.  Therapy evaluations completed due to patient's left-sided weakness dysarthria/dysphagia was admitted for a comprehensive rehab program.     Pt reports LBM was maybe 2 days ago- denies constipation- peeing well per pt.  Denies pain.      Review of Systems  Constitutional:  Negative for chills and fever.  HENT:  Positive for hearing loss.   Eyes:  Negative for blurred vision and  double vision.  Respiratory:  Negative for cough and shortness of breath.   Cardiovascular:  Negative for chest pain, palpitations and leg swelling.  Gastrointestinal:  Positive for constipation. Negative for heartburn, nausea and vomiting.       GERD  Genitourinary:  Negative for dysuria, flank pain and hematuria.  Musculoskeletal:  Positive for back pain.  Skin:  Negative for rash.  Neurological:  Positive for speech change and weakness.  Psychiatric/Behavioral:  Positive for depression and memory loss.   All other systems reviewed and are negative.     Past Medical History:  Diagnosis Date   Decreased sense of smell      and taste-negative CT scan-2011   Depression     Essential hypertension     GERD (gastroesophageal reflux disease)     Heart murmur      heard first time 4/12   History of mammogram      2010 and she does not want to repeat them anymore   HOCM (hypertrophic obstructive cardiomyopathy) ([place])      a. Dx by echo 05/2014.   Hypothyroidism     IBS (irritable bowel syndrome)     Impaired fasting glucose     Low back pain     Lumbar radiculopathy      with negative MRI (1991)   Postmenopausal      with osteopenia, bisphosphonates 1/05-1/11, improved osteopenia 4/12-repeat planned late 2015   Shingles      2015   Thyroid nodule     Vitamin D deficiency      repleted on weekly  vitamin D 50000 iu         Past Surgical History:  Procedure Laterality Date   ABDOMINAL HYSTERECTOMY       BREAST BIOPSY        x2   CATARACT EXTRACTION        removal with IOL bilateral   GLAUCOMA SURGERY        laser   skin cancer removal       THYROIDECTOMY        subtotal         Family History  Problem Relation Age of Onset   Breast cancer Mother     Hypertension Father     CVA Father     Heart failure Sister     CAD Sister     Kidney disease Sister     COPD Brother     Lung cancer Brother     CAD Brother     Diabetes Brother     Stroke Father     Heart attack  Neg Hx      Social History:  reports that she has never smoked. She has never used smokeless tobacco. She reports that she does not drink alcohol and does not use drugs. Allergies:      Allergies  Allergen Reactions   Codeine Nausea Only          Medications Prior to Admission  Medication Sig Dispense Refill   Cholecalciferol (VITAMIN D-3) 25 MCG (1000 UT) CAPS Take 1 capsule by mouth daily.       donepezil (ARICEPT) 5 MG tablet Take 5 mg by mouth at bedtime.       hydrochlorothiazide (HYDRODIURIL) 25 MG tablet Take 25 mg by mouth daily.        levothyroxine (SYNTHROID, LEVOTHROID) 75 MCG tablet Take 75 mcg by mouth daily before breakfast.        pyridOXINE (VITAMIN B-6) 25 MG tablet Take 25 mg by mouth daily.       sertraline (ZOLOFT) 50 MG tablet Take 100 mg by mouth daily.       verapamil (CALAN-SR) 240 MG CR tablet Take 1 tablet (240 mg total) by mouth at bedtime. 30 tablet 3   vitamin C (ASCORBIC ACID) 500 MG tablet Take 500 mg by mouth daily.       zinc gluconate 50 MG tablet Take 50 mg by mouth daily.          Drug Regimen Review Drug regimen was reviewed and remains appropriate with no significant issues identified   Home: Home Living Family/patient expects to be discharged to:: Private residence Living Arrangements: Alone Available Help at Discharge: Family, Friend(s), Available PRN/intermittently Type of Home: House Home Access: Ramped entrance Home Layout: Two level, Able to live on main level with bedroom/bathroom Bathroom Accessibility: Yes Home Equipment: Walker - 2 wheels, Shower seat, Bedside commode   Functional History: Prior Function Level of Independence: Independent with assistive device(s) Comments: Patient and family state houshold ambulation with RW, independent with basic ADL with assist to transfer into shower; family and friends assist PRN   Functional Status:  Mobility: Bed Mobility Overal bed mobility: Needs Assistance Bed Mobility: Supine to  Sit, Sit to Supine Rolling: Mod assist, Min assist Supine to sit: Max assist, Mod assist Sit to supine: Max assist, Mod assist General bed mobility comments: right extremities attempting Transfers Overall transfer level: Needs assistance Equipment used: 1 person hand held assist Transfers: Sit to/from Stand, Radiographer, therapeutic, Lateral/Scoot Transfers Sit to Stand:  Max assist Anterior-Posterior transfers: Mod assist  Lateral/Scoot Transfers: Mod assist, Max assist General transfer comment: attempted sit to stand and lateral scoots to left/head of bed with left knee blocked; mod assist to perform bridging to scoot lateral to right in bed Ambulation/Gait General Gait Details: unable   ADL: ADL Overall ADL's : Needs assistance/impaired Eating/Feeding: Set up, Minimal assistance, Sitting Grooming: Moderate assistance, Minimal assistance, Sitting, Bed level Upper Body Bathing: Moderate assistance, Bed level Lower Body Bathing: Maximal assistance, Total assistance, Bed level Upper Body Dressing : Moderate assistance, Bed level Lower Body Dressing: Maximal assistance, Total assistance, Bed level Toilet Transfer: Maximal assistance, Total assistance, Stand-pivot, RW Toileting- Clothing Manipulation and Hygiene: Total assistance Tub/ Shower Transfer: Maximal assistance, Total assistance Functional mobility during ADLs: Total assistance, Maximal assistance General ADL Comments: Pt unbale to sit up at bedside without assistance. Sit to stand not attempted due to poor postural stability seated. Clinical judgement used to determine ADL levels.   Cognition: Cognition Overall Cognitive Status: Within Functional Limits for tasks assessed Arousal/Alertness: Awake/alert Orientation Level: Disoriented to time, Disoriented to situation Attention: Sustained Sustained Attention: Impaired Sustained Attention Impairment: Verbal basic, Functional basic Memory: Impaired Memory Impairment:  Retrieval deficit, Decreased recall of new information, Decreased short term memory Decreased Short Term Memory: Verbal basic, Functional basic Immediate Memory Recall: Sock, Blue, Bed Memory Recall Sock: Not able to recall Memory Recall Blue: Not able to recall Memory Recall Bed: Not able to recall Awareness: Impaired Awareness Impairment: Anticipatory impairment Problem Solving: Impaired Problem Solving Impairment: Verbal basic, Functional basic Behaviors: Perseveration Safety/Judgment: Impaired Cognition Arousal/Alertness: Awake/alert Behavior During Therapy: WFL for tasks assessed/performed Overall Cognitive Status: Within Functional Limits for tasks assessed   Physical Exam: Blood pressure (!) 158/70, pulse 71, temperature 98 F (36.7 C), temperature source Oral, resp. rate 16, height '5\' 2"'$  (1.575 m), weight 60.4 kg, SpO2 98 %. Physical Exam Vitals and nursing note reviewed.  Constitutional:      Comments: Elderly female laying in bed- delayed responses, but joking some; frail appearing, NAD  HENT:     Head: Normocephalic and atraumatic.     Comments: Head tilt noted to R- chin to left at rest- took some coaxing to go to midline.  L nasolabial fold flatter; and L facial droop at rest, but improved with smile; tongue to L slightly.     Right Ear: External ear normal.     Left Ear: External ear normal.     Nose: Nose normal. No congestion.     Mouth/Throat:     Mouth: Mucous membranes are moist.     Pharynx: No oropharyngeal exudate.  Eyes:     General:        Right eye: No discharge.        Left eye: No discharge.     Extraocular Movements: Extraocular movements intact.  Neck:     Comments: R head tilt- chin to Left at rest- could correct Cardiovascular:     Rate and Rhythm: Normal rate and regular rhythm.     Heart sounds: Normal heart sounds. No murmur heard.   No gallop.  Pulmonary:     Comments: CTA B/L- no W/R/R- good air movement   Abdominal:     Comments:  Soft, NT, ND, (+)BS - hypoactive  Genitourinary:    Comments: Using bedpan to void per pt- and said she needs to go now Musculoskeletal:     Comments: RUE 5/5 RLE- 5-/5  LUE 0/5 LLE- HF 1/5; KE/KF 0/5, DF/PF  4-/5 Harder to extend L knee, but doesn't feel like spasticity  Skin:    General: Skin is warm and dry.     Comments: IV in L AC fossa- looks OK- a lot of bruising from needle sticks in B/L arms  Neurological:     Mental Status: She is alert.     Comments: Patient is alert.  Severely dysarthric.  Follows commands.  Provides her name but had difficulty with month. Very decreased/absent sensation to light touch in LUE; less sensation  in LLE- but better than LUE.  Intact to light touch on RUE/RLE; also decreased on L face  Psychiatric:     Comments: Slightly delayed responses, but joking some;       Lab Results Last 48 Hours        Results for orders placed or performed during the hospital encounter of 05/29/21 (from the past 48 hour(s))  Glucose, capillary     Status: Abnormal    Collection Time: 05/30/21 11:54 AM  Result Value Ref Range    Glucose-Capillary 138 (H) 70 - 99 mg/dL      Comment: Glucose reference range applies only to samples taken after fasting for at least 8 hours.  Glucose, capillary     Status: Abnormal    Collection Time: 05/30/21  5:09 PM  Result Value Ref Range    Glucose-Capillary 111 (H) 70 - 99 mg/dL      Comment: Glucose reference range applies only to samples taken after fasting for at least 8 hours.  Glucose, capillary     Status: Abnormal    Collection Time: 05/30/21  9:35 PM  Result Value Ref Range    Glucose-Capillary 125 (H) 70 - 99 mg/dL      Comment: Glucose reference range applies only to samples taken after fasting for at least 8 hours.  Basic metabolic panel     Status: Abnormal    Collection Time: 05/31/21  4:27 AM  Result Value Ref Range    Sodium 140 135 - 145 mmol/L    Potassium 3.0 (L) 3.5 - 5.1 mmol/L    Chloride 107 98 - 111  mmol/L    CO2 24 22 - 32 mmol/L    Glucose, Bld 118 (H) 70 - 99 mg/dL      Comment: Glucose reference range applies only to samples taken after fasting for at least 8 hours.    BUN 14 8 - 23 mg/dL    Creatinine, Ser 0.70 0.44 - 1.00 mg/dL    Calcium 8.4 (L) 8.9 - 10.3 mg/dL    GFR, Estimated >60 >60 mL/min      Comment: (NOTE) Calculated using the CKD-EPI Creatinine Equation (2021)      Anion gap 9 5 - 15      Comment: Performed at [facility], [address] [place], [place] [address]  CBC     Status: Abnormal    Collection Time: 05/31/21  4:27 AM  Result Value Ref Range    WBC 12.6 (H) 4.0 - 10.5 K/uL    RBC 4.56 3.87 - 5.11 MIL/uL    Hemoglobin 13.8 12.0 - 15.0 g/dL    HCT 40.9 36.0 - 46.0 %    MCV 89.7 80.0 - 100.0 fL    MCH 30.3 26.0 - 34.0 pg    MCHC 33.7 30.0 - 36.0 g/dL    RDW 14.5 11.5 - 15.5 %    Platelets 269 150 - 400 K/uL    nRBC 0.0 0.0 -  0.2 %      Comment: Performed at [facility] [address], [place], [place] [address]  Glucose, capillary     Status: Abnormal    Collection Time: 05/31/21  7:31 AM  Result Value Ref Range    Glucose-Capillary 124 (H) 70 - 99 mg/dL      Comment: Glucose reference range applies only to samples taken after fasting for at least 8 hours.  Glucose, capillary     Status: Abnormal    Collection Time: 05/31/21 11:45 AM  Result Value Ref Range    Glucose-Capillary 106 (H) 70 - 99 mg/dL      Comment: Glucose reference range applies only to samples taken after fasting for at least 8 hours.  Glucose, capillary     Status: Abnormal    Collection Time: 05/31/21  5:26 PM  Result Value Ref Range    Glucose-Capillary 128 (H) 70 - 99 mg/dL      Comment: Glucose reference range applies only to samples taken after fasting for at least 8 hours.  Glucose, capillary     Status: Abnormal    Collection Time: 06/01/21 12:15 AM  Result Value Ref Range    Glucose-Capillary 135 (H) 70 - 99 mg/dL      Comment: Glucose reference range applies only  to samples taken after fasting for at least 8 hours.  CBC     Status: None    Collection Time: 06/01/21  4:56 AM  Result Value Ref Range    WBC 9.0 4.0 - 10.5 K/uL    RBC 4.14 3.87 - 5.11 MIL/uL    Hemoglobin 12.9 12.0 - 15.0 g/dL    HCT 37.4 36.0 - 46.0 %    MCV 90.3 80.0 - 100.0 fL    MCH 31.2 26.0 - 34.0 pg    MCHC 34.5 30.0 - 36.0 g/dL    RDW 14.4 11.5 - 15.5 %    Platelets 240 150 - 400 K/uL    nRBC 0.0 0.0 - 0.2 %      Comment: Performed at [facility], [address] [place], [place] [id number]  Basic metabolic panel     Status: Abnormal    Collection Time: 06/01/21  4:56 AM  Result Value Ref Range    Sodium 138 135 - 145 mmol/L    Potassium 3.1 (L) 3.5 - 5.1 mmol/L    Chloride 108 98 - 111 mmol/L    CO2 26 22 - 32 mmol/L    Glucose, Bld 121 (H) 70 - 99 mg/dL      Comment: Glucose reference range applies only to samples taken after fasting for at least 8 hours.    BUN 16 8 - 23 mg/dL    Creatinine, Ser 0.77 0.44 - 1.00 mg/dL    Calcium 8.2 (L) 8.9 - 10.3 mg/dL    GFR, Estimated >60 >60 mL/min      Comment: (NOTE) Calculated using the CKD-EPI Creatinine Equation (2021)      Anion gap 4 (L) 5 - 15      Comment: Performed at [facility], [address], [place], [place] [address]  Glucose, capillary     Status: Abnormal    Collection Time: 06/01/21  6:35 AM  Result Value Ref Range    Glucose-Capillary 122 (H) 70 - 99 mg/dL      Comment: Glucose reference range applies only to samples taken after fasting for at least 8 hours.       Imaging Results (Last 48  hours)  DG Swallowing Func-Speech Pathology   Result Date: 05/30/2021 Formatting of this result is different from the original. Objective Swallowing Evaluation: Type of Study: MBS-Modified Barium Swallow Study  Patient Details Name: KADESHA FRANDSEN MRN: [id number] Date of Birth: June 15, 1931 Today's Date: 05/30/2021 Time: SLP Start Time (ACUTE ONLY): 1015 -SLP Stop Time (ACUTE ONLY): [id number] SLP Time Calculation (min) (ACUTE  ONLY): 21 min Past Medical History: Past Medical History: Diagnosis Date  Decreased sense of smell   and taste-negative CT scan-2011  Depression   Essential hypertension   GERD (gastroesophageal reflux disease)   Heart murmur   heard first time 4/12  History of mammogram   2010 and she does not want to repeat them anymore  HOCM (hypertrophic obstructive cardiomyopathy) ([place])   a. Dx by echo 05/2014.  Hypothyroidism   IBS (irritable bowel syndrome)   Impaired fasting glucose   Low back pain   Lumbar radiculopathy   with negative MRI (1991)  Postmenopausal   with osteopenia, bisphosphonates 1/05-1/11, improved osteopenia 4/12-repeat planned late 2015  Shingles   2015  Thyroid nodule   Vitamin D deficiency   repleted on weekly vitamin D 50000 iu Past Surgical History: Past Surgical History: Procedure Laterality Date  ABDOMINAL HYSTERECTOMY    BREAST BIOPSY    x2  CATARACT EXTRACTION    removal with IOL bilateral  GLAUCOMA SURGERY    laser  skin cancer removal    THYROIDECTOMY    subtotal HPI: LURIE CETRONE is a 87 y.o. female with medical history significant of essential hypertension, hypothyroidism, gastroesophageal reflux disease, depression, vitamin D deficiency, history of HOCM and chronic low back pain; who presented to the hospital secondary to left-sided weakness and dysarthria. Patient was found on the floor demonstrating left-sided weakness, left lateralized gaze, vomiting content next to her and also dysarthria. MRI reveals "Acute right pontine infarct. Mild associated edema without mass effect. Multiple small remote bilateral cerebellar lacunar infarcts." Pt failed [place] stroke swallow, BSE requested.  No data recorded Assessment / Plan / Recommendation CHL IP CLINICAL IMPRESSIONS 05/30/2021 Clinical Impression Pt presents with mild/moderate sensorimotor oropharyngeal dysphagia characterized by silent and sensed aspiration of thin liquids. Pt demonstrates decreased laryngeal vestibule closure with cup sips of  thin liquid resulting in penetration to the cords during the swallow that is not sensed and trace amounts typically fall below the cords after the swallow (aspiration is sensed ~50% of the time). Decreased penetration was noted with straw sips of thin liquids, however, trace amounts still fall to the cords and were likely eventually aspirated. Chin tuck was very effective in minimizing/eliminating penetration during the swallow, which also minimized/eliminated aspiration after the swallow. Minimal/occasional flash/shallow penetration visualized with NTL trials with NO aspiration. Puree textures were consumed withouth incident, note as multiple trials went on Pt with mild valleculae residue required a verbal cue for repeat dry swallow. With regular textures Pt demonstrated prolonged oral prep stage, reporting biting her L cheeck during mastication, poor awareness of bolus at times (resulting in holding) and mild/mod oral residue after the swallow. Pt swallowed barium tablet without incident. Recommend initiate conservative diet of D1/puree and NECTAR thick liquids with initiation of skilled ST to implement strategies for oral dysphagia and trials of thin liquids with chin tuck -- further, permit free water (water only after oral care). Despite the fact that chin tuck was effective in minimizing/eliminating penetration/aspiration during the swallow Pt is still at high risk for aspiration with thin liquids secondary to changes in mentation  post this acute CVA resulting in talking during PO despite cues not to, suspected memory deficit (?ability to consistently utilize chin tuck), and mild pharyngeal residue post swallow could still be silently aspirated. Puree is recommended secondary to poor oral control and awareness of new decreased labial/lingual ROM and strength. Pt has good potential for diet upgrade to thin/soft textures with ST therapy and as awareness and mentation hopefully improve. Above reviewed with Pt and  Pt's daughters. SLP Visit Diagnosis Dysphagia, oropharyngeal phase (R13.12) Attention and concentration deficit following -- Frontal lobe and executive function deficit following -- Impact on safety and function Mild aspiration risk;Moderate aspiration risk   CHL IP TREATMENT RECOMMENDATION 05/30/2021 Treatment Recommendations Therapy as outlined in treatment plan below   Prognosis 05/30/2021 Prognosis for Safe Diet Advancement Good Barriers to Reach Goals Cognitive deficits Barriers/Prognosis Comment -- CHL IP DIET RECOMMENDATION 05/30/2021 SLP Diet Recommendations Dysphagia 1 (Puree) solids;Nectar thick liquid Liquid Administration via Cup Medication Administration Whole meds with liquid Compensations Minimize environmental distractions;Slow rate;Small sips/bites Postural Changes Remain semi-upright after after feeds/meals (Comment);Seated upright at 90 degrees   CHL IP OTHER RECOMMENDATIONS 05/30/2021 Recommended Consults -- Oral Care Recommendations Oral care BID Other Recommendations Remove water pitcher;Order thickener from pharmacy;Prohibited food (jello, ice cream, thin soups)   CHL IP FOLLOW UP RECOMMENDATIONS 05/30/2021 Follow up Recommendations Skilled Nursing facility;24 hour supervision/assistance   CHL IP FREQUENCY AND DURATION 05/30/2021 Speech Therapy Frequency (ACUTE ONLY) min 2x/week Treatment Duration 1 week      CHL IP ORAL PHASE 05/30/2021 Oral Phase Impaired Oral - Pudding Teaspoon -- Oral - Pudding Cup -- Oral - Honey Teaspoon -- Oral - Honey Cup -- Oral - Nectar Teaspoon NT Oral - Nectar Cup Decreased bolus cohesion;Lingual pumping Oral - Nectar Straw NT Oral - Thin Teaspoon Premature spillage;Decreased bolus cohesion;Piecemeal swallowing;Lingual/palatal residue;Lingual pumping;Weak lingual manipulation Oral - Thin Cup Premature spillage;Decreased bolus cohesion;Piecemeal swallowing;Lingual/palatal residue;Lingual pumping;Weak lingual manipulation Oral - Thin Straw Premature spillage;Decreased bolus  cohesion;Piecemeal swallowing;Lingual/palatal residue;Lingual pumping;Weak lingual manipulation Oral - Puree Decreased bolus cohesion;Delayed oral transit;Lingual pumping;Weak lingual manipulation;Lingual/palatal residue;Piecemeal swallowing Oral - Mech Soft NT Oral - Regular Decreased bolus cohesion;Delayed oral transit;Lingual pumping;Weak lingual manipulation;Lingual/palatal residue;Piecemeal swallowing Oral - Multi-Consistency NT Oral - Pill WFL Oral Phase - Comment --  CHL IP PHARYNGEAL PHASE 05/30/2021 Pharyngeal Phase Impaired Pharyngeal- Pudding Teaspoon -- Pharyngeal -- Pharyngeal- Pudding Cup -- Pharyngeal -- Pharyngeal- Honey Teaspoon -- Pharyngeal -- Pharyngeal- Honey Cup -- Pharyngeal -- Pharyngeal- Nectar Teaspoon NT Pharyngeal -- Pharyngeal- Nectar Cup Penetration/Aspiration during swallow;Pharyngeal residue - pyriform;Pharyngeal residue - valleculae Pharyngeal Material enters airway, remains ABOVE vocal cords then ejected out Pharyngeal- Nectar Straw NT Pharyngeal -- Pharyngeal- Thin Teaspoon Delayed swallow initiation-pyriform sinuses;Penetration/Aspiration during swallow;Trace aspiration;Pharyngeal residue - valleculae;Pharyngeal residue - pyriform Pharyngeal Material enters airway, remains ABOVE vocal cords then ejected out Pharyngeal- Thin Cup Delayed swallow initiation-pyriform sinuses;Penetration/Aspiration during swallow;Trace aspiration;Pharyngeal residue - valleculae;Pharyngeal residue - pyriform Pharyngeal Material enters airway, passes BELOW cords and not ejected out despite cough attempt by patient Pharyngeal- Thin Straw Delayed swallow initiation-pyriform sinuses;Penetration/Aspiration during swallow;Pharyngeal residue - valleculae;Pharyngeal residue - pyriform Pharyngeal Material enters airway, remains ABOVE vocal cords then ejected out Pharyngeal- Puree Pharyngeal residue - valleculae Pharyngeal -- Pharyngeal- Mechanical Soft NT Pharyngeal -- Pharyngeal- Regular Pharyngeal residue -  valleculae Pharyngeal -- Pharyngeal- Multi-consistency NT Pharyngeal -- Pharyngeal- Pill WFL Pharyngeal -- Pharyngeal Comment --  CHL IP CERVICAL ESOPHAGEAL PHASE 05/30/2021 Cervical Esophageal Phase WFL Pudding Teaspoon -- Pudding Cup -- Honey Teaspoon -- Honey Cup -- Nectar Teaspoon --  Nectar Cup -- [employer] -- Thin Teaspoon -- Thin Cup -- Thin Straw -- Puree -- Mechanical Soft -- Regular -- Multi-consistency -- Pill -- Cervical Esophageal Comment -- Amelia H. Roddie Mc, CCC-SLP Speech Language Pathologist Wende Bushy 05/30/2021, 11:41 AM               EEG adult   Result Date: 05/30/2021 Lora Havens, MD     05/30/2021  5:27 PM Patient Name: KHARTER BORRIES MRN: [id number] Epilepsy Attending: Lora Havens Referring Physician/Provider: Dr Barton Dubois Date: 05/30/2021 Duration: 24.27 mins Patient history: 87yo F with left sided weakness and dysarthria. EEG to evaluate for seizure. Level of alertness: Awake, asleep AEDs during EEG study: Technical aspects: This EEG study was done with scalp electrodes positioned according to the 10-20 International system of electrode placement. Electrical activity was acquired at a sampling rate of '500Hz'$  and reviewed with a high frequency filter of '70Hz'$  and a low frequency filter of '1Hz'$ . EEG data were recorded continuously and digitally stored. Description: The posterior dominant rhythm consists of 6 Hz activity of moderate voltage (25-35 uV) seen predominantly in posterior head regions, symmetric and reactive to eye opening and eye closing. Sleep was characterized by vertex waves, sleep spindles (12 to 14 Hz), maximal frontocentral region. Hyperventilation and photic stimulation were not performed.   ABNORMALITY - Background slow IMPRESSION: This study is suggestive of mild diffuse encephalopathy, nonspecific etiology. No seizures or epileptiform discharges were seen throughout the recording. Lora Havens             Medical Problem List and Plan: 1.    Left-sided weakness with dysarthria/dysphagia secondary to right pontine infarction as well as multiple small remote bilateral cerebellar lacunar infarcts             -patient may  shower             -ELOS/Goals: 21-24 days min Assist 2.  Antithrombotics: -DVT/anticoagulation: Lovenox Pharmaceutical: Lovenox             -antiplatelet therapy: Aspirin 81 mg daily and Plavix 75 mg day x1 month then Plavix alone 3. Pain Management/chronic back pain: Tylenol as needed 4. Mood: Zoloft 100 mg daily, Aricept 5 mg nightly             -antipsychotic agents: N/A 5. Neuropsych: This patient is capable of making decisions on her own behalf. 6. Skin/Wound Care: Routine skin checks 7. Fluids/Electrolytes/Nutrition: Routine in and outs with follow-up chemistries 8.  Dysphagia.  Dysphagia #1 nectar thick liquids.  Follow-up speech therapy.  Monitor hydration 9.  Permissive hypertension.  Patient on verapamil 240 mg daily prior to admission as well as HCTZ 25 mg daily.  Resume as needed 10.  Hyperlipidemia.  Lipitor 11.  Hypothyroidism.  TSH 2.093 .Continue Synthroid 12.  Constipation.  MiraLAX scheduled daily.  Senokot S2 tablets nightly patient did receive Dulcolax suppository 06/01/2021- per pt, LBM 2 days ago.        Lavon Paganini Angiulli, PA-C 06/01/2021    I have personally performed a face to face diagnostic evaluation of this patient and formulated the key components of the plan.  Additionally, I have personally reviewed laboratory data, imaging studies, as well as relevant notes and concur with the physician assistant's documentation above.   The patient's status has not changed from the original H&P.  Any changes in documentation from the acute care chart have been noted above.

## 2021-06-01 NOTE — Plan of Care (Signed)
  Problem: Acute Rehab OT Goals (only OT should resolve) Goal: Pt. Will Perform Eating Flowsheets (Taken 06/01/2021 0925) Pt Will Perform Eating:  with set-up  bed level  sitting Goal: Pt. Will Perform Grooming Flowsheets (Taken 06/01/2021 0925) Pt Will Perform Grooming:  with set-up  sitting  with min assist  with adaptive equipment  bed level Goal: Pt. Will Perform Upper Body Dressing Flowsheets (Taken 06/01/2021 0925) Pt Will Perform Upper Body Dressing:  with min assist  with supervision  sitting  bed level  with adaptive equipment Goal: Pt. Will Perform Lower Body Dressing Flowsheets (Taken 06/01/2021 0925) Pt Will Perform Lower Body Dressing:  with mod assist  with adaptive equipment  sitting/lateral leans  bed level Goal: Pt. Will Transfer To Toilet Flowsheets (Taken 06/01/2021 0925) Pt Will Transfer to Toilet:  with mod assist  stand pivot transfer  bedside commode Goal: Pt/Caregiver Will Perform Home Exercise Program Flowsheets (Taken 06/01/2021 0925) Pt/caregiver will Perform Home Exercise Program:  Increased ROM  Left upper extremity  With minimal assist  Marigene Erler OT, MOT

## 2021-06-01 NOTE — Plan of Care (Signed)
  Problem: RH KNOWLEDGE DEFICIT Goal: RH STG INCREASE KNOWLEDGE OF STROKE PROPHYLAXIS Description: Patient will demonstrate knowledge of medication management of secondary stoke prevention medications with educational materials and handouts provided by staff independently at discharge. Outcome: Progressing; discussed with family

## 2021-06-01 NOTE — Care Management Important Message (Signed)
Important Message  Patient Details  Name: Vickie Mckee MRN: [id number] Date of Birth: 1930/11/05   Medicare Important Message Given:  Yes     Tommy Medal 06/01/2021, 11:54 AM

## 2021-06-01 NOTE — Clinical Social Work Note (Signed)
Daughter, Ms. Laurann Montana, notified of d/c to CIR.    Lydon Vansickle, Clydene Pugh, LCSW

## 2021-06-01 NOTE — Progress Notes (Signed)
Physical Therapy Treatment Patient Details Name: Vickie Mckee MRN: [id number] DOB: Feb 20, 1931 Today's Date: 06/01/2021    History of Present Illness Vickie Mckee is a 87 y.o. female with medical history significant of essential hypertension, hypothyroidism, gastroesophageal reflux disease, depression, vitamin D deficiency, history of HOCM and chronic low back pain; who presented to the hospital secondary to left-sided weakness and dysarthria.  Patient is on clear about exact  Timing of initiation of her symptoms; she reported last felt normal after waking up this morning around 7 AM.  Patient reported ambulating as she normally does after waking up in the morning, and anywhere in between 8 and 9 she has no recollection of the events, family was not able to reach her and contacted EMS in the way to her house.  Patient was found on the floor demonstrating left-sided weakness, left lateralized gaze, vomiting content next to her and also dysarthria.  Patient denies chest pain, shortness of breath, abdominal pain, headaches, fever, chills, dysuria, hematuria, melena, hematochezia, sick contacts or any other complaints.  There is no history of urinary or fecal incontinence when patient was found.    PT Comments    Patient agreeable to therapy session today. Daughter present throughout session. Patient required mod to max assist for all mobility. Provided left upper extremity joint compression in the seated position. Patient was more able to maintain the seated position after 3-4 minutes and performed sitting balance exercises for 1-2 minutes. Patient required cues for more erect posturing. Patient continues to complain of left adductor, left shoulder and neck pain during session.  Patient would continue to benefit from skilled physical therapy in current environment and next venue to continue return to prior function and increase strength, endurance, balance, coordination, and functional mobility and gait  skills when able.    Follow Up Recommendations  CIR     Equipment Recommendations  None recommended by PT    Recommendations for Other Services       Precautions / Restrictions Precautions Precautions: Fall Restrictions Weight Bearing Restrictions: No    Mobility  Bed Mobility Overal bed mobility: Needs Assistance Bed Mobility: Supine to Sit;Sit to Supine     Supine to sit: Max assist;Mod assist Sit to supine: Max assist;Mod assist   General bed mobility comments: right extremities attempting    Transfers Overall transfer level: Needs assistance Equipment used: 1 person hand held assist Transfers: Sit to/from Stand;Anterior-Posterior Transfer;Lateral/Scoot Transfers Sit to Stand: Max Designer, television/film set transfers: Mod assist  Lateral/Scoot Transfers: Mod assist;Max assist General transfer comment: attempted sit to stand and lateral scoots to left/head of bed with left knee blocked; mod assist to perform bridging to scoot lateral to right in bed  Ambulation/Gait             General Gait Details: unable   Stairs             Wheelchair Mobility    Modified Rankin (Stroke Patients Only)       Balance Overall balance assessment: Needs assistance Sitting-balance support: Single extremity supported;Feet unsupported;Feet supported Sitting balance-Leahy Scale: Poor Sitting balance - Comments: Unable to remain sitting at EOB without additional physical assist. Postural control: Left lateral lean Standing balance support: Single extremity supported;During functional activity Standing balance-Leahy Scale: Zero                              Cognition Arousal/Alertness: Awake/alert Behavior During Therapy: [facility]  for tasks assessed/performed Overall Cognitive Status: Within Functional Limits for tasks assessed                                        Exercises General Exercises - Upper Extremity Digit Composite  Flexion: Strengthening;Both;5 reps;Squeeze ball General Exercises - Lower Extremity Ankle Circles/Pumps: AAROM;Strengthening;Both;5 reps;Supine Short Arc Quad: AAROM;Strengthening;Both;5 reps;Supine Heel Slides: AAROM;Strengthening;Both;5 reps;Supine Hip ABduction/ADduction: AAROM;Strengthening;Both;5 reps;Supine    General Comments        Pertinent Vitals/Pain Pain Assessment: Faces Faces Pain Scale: Hurts little more Pain Location: left adductor insertion area, left neck, left shoulder Pain Descriptors / Indicators: Aching Pain Intervention(s): Limited activity within patient's tolerance;Monitored during session;Repositioned    Home Living Family/patient expects to be discharged to:: Private residence Living Arrangements: Alone Available Help at Discharge: Family;Friend(s);Available PRN/intermittently Type of Home: House Home Access: Ramped entrance   Home Layout: Two level;Able to live on main level with bedroom/bathroom Home Equipment: Gilford Rile - 2 wheels;Shower seat;Bedside commode      Prior Function Level of Independence: Independent with assistive device(s)      Comments: Patient and family state houshold ambulation with RW, independent with basic ADL with assist to transfer into shower; family and friends assist PRN   PT Goals (current goals can now be found in the care plan section) Acute Rehab PT Goals Patient Stated Goal: return home after getting more mobile PT Goal Formulation: With patient Time For Goal Achievement: 06/13/21 Potential to Achieve Goals: Fair Progress towards PT goals: Progressing toward goals    Frequency    Min 3X/week      PT Plan Current plan remains appropriate       AM-PAC PT "6 Clicks" Mobility   Outcome Measure  Help needed turning from your back to your side while in a flat bed without using bedrails?: A Little Help needed moving from lying on your back to sitting on the side of a flat bed without using bedrails?: A  Lot Help needed moving to and from a bed to a chair (including a wheelchair)?: A Lot Help needed standing up from a chair using your arms (e.g., wheelchair or bedside chair)?: A Lot Help needed to walk in hospital room?: A Lot Help needed climbing 3-5 steps with a railing? : Total 6 Click Score: 12    End of Session   Activity Tolerance: Patient tolerated treatment well Patient left: in bed;with call bell/phone within reach;with family/visitor present Nurse Communication: Mobility status PT Visit Diagnosis: Unsteadiness on feet (R26.81);Other abnormalities of gait and mobility (R26.89);Muscle weakness (generalized) (M62.81);Other symptoms and signs involving the nervous system (R29.898);Hemiplegia and hemiparesis Hemiplegia - Right/Left: Left Hemiplegia - caused by: Cerebral infarction     Time: [id number] PT Time Calculation (min) (ACUTE ONLY): 25 min  Charges:  $Therapeutic Exercise: 8-22 mins $Therapeutic Activity: 8-22 mins                     Floria Raveling. Hartnett-Rands, MS, PT Per [place] [phone]  Pamala Hurry  Hartnett-Rands 06/01/2021, 10:30 AM

## 2021-06-02 ENCOUNTER — Inpatient Hospital Stay (HOSPITAL_COMMUNITY): Payer: Medicare Other

## 2021-06-02 DIAGNOSIS — G8194 Hemiplegia, unspecified affecting left nondominant side: Secondary | ICD-10-CM | POA: Diagnosis not present

## 2021-06-02 LAB — CBC WITH DIFFERENTIAL/PLATELET
Abs Immature Granulocytes: 0.04 10*3/uL (ref 0.00–0.07)
Basophils Absolute: 0 10*3/uL (ref 0.0–0.1)
Basophils Relative: 1 %
Eosinophils Absolute: 0.3 10*3/uL (ref 0.0–0.5)
Eosinophils Relative: 3 %
HCT: 38.3 % (ref 36.0–46.0)
Hemoglobin: 13.4 g/dL (ref 12.0–15.0)
Immature Granulocytes: 1 %
Lymphocytes Relative: 37 %
Lymphs Abs: 3.1 10*3/uL (ref 0.7–4.0)
MCH: 30.6 pg (ref 26.0–34.0)
MCHC: 35 g/dL (ref 30.0–36.0)
MCV: 87.4 fL (ref 80.0–100.0)
Monocytes Absolute: 0.9 10*3/uL (ref 0.1–1.0)
Monocytes Relative: 10 %
Neutro Abs: 4.1 10*3/uL (ref 1.7–7.7)
Neutrophils Relative %: 48 %
Platelets: 263 10*3/uL (ref 150–400)
RBC: 4.38 MIL/uL (ref 3.87–5.11)
RDW: 14.1 % (ref 11.5–15.5)
WBC: 8.4 10*3/uL (ref 4.0–10.5)
nRBC: 0 % (ref 0.0–0.2)

## 2021-06-02 LAB — COMPREHENSIVE METABOLIC PANEL
ALT: 22 U/L (ref 0–44)
AST: 23 U/L (ref 15–41)
Albumin: 2.8 g/dL — ABNORMAL LOW (ref 3.5–5.0)
Alkaline Phosphatase: 47 U/L (ref 38–126)
Anion gap: 7 (ref 5–15)
BUN: 15 mg/dL (ref 8–23)
CO2: 23 mmol/L (ref 22–32)
Calcium: 8.6 mg/dL — ABNORMAL LOW (ref 8.9–10.3)
Chloride: 107 mmol/L (ref 98–111)
Creatinine, Ser: 0.74 mg/dL (ref 0.44–1.00)
GFR, Estimated: >60 mL/min (ref >60)
Glucose, Bld: 110 mg/dL — ABNORMAL HIGH (ref 70–99)
Potassium: 3.7 mmol/L (ref 3.5–5.1)
Sodium: 137 mmol/L (ref 135–145)
Total Bilirubin: 0.8 mg/dL (ref 0.3–1.2)
Total Protein: 5.4 g/dL — ABNORMAL LOW (ref 6.5–8.1)

## 2021-06-02 LAB — GLUCOSE, CAPILLARY
Glucose-Capillary: 107 mg/dL — ABNORMAL HIGH (ref 70–99)
Glucose-Capillary: 106 mg/dL — ABNORMAL HIGH (ref 70–99)
Glucose-Capillary: 121 mg/dL — ABNORMAL HIGH (ref 70–99)
Glucose-Capillary: 114 mg/dL — ABNORMAL HIGH (ref 70–99)

## 2021-06-02 MED ORDER — POTASSIUM CHLORIDE 20 MEQ PO PACK
20.0000 meq | PACK | Freq: Once | ORAL | Status: AC
  Administered 2021-06-02: 20 meq via ORAL
  Filled 2021-06-02: qty 1

## 2021-06-02 NOTE — Progress Notes (Signed)
Inpatient Rehabilitation  Patient information reviewed and entered into eRehab system by Adelard Sanon Davarion Cuffee, OTR/L.   Information including medical coding, functional ability and quality indicators will be reviewed and updated through discharge.    

## 2021-06-02 NOTE — Progress Notes (Signed)
Initial Nutrition Assessment  DOCUMENTATION CODES:   Not applicable  INTERVENTION:  Continue Ensure Enlive po BID (thickened to nectar thick consistency), each supplement provides 350 kcal and 20 grams of protein.  Encourage adequate PO intake.   NUTRITION DIAGNOSIS:   Increased nutrient needs related to  (therapy) as evidenced by estimated needs.  GOAL:   Patient will meet greater than or equal to 90% of their needs  MONITOR:   PO intake, Supplement acceptance, Skin, Weight trends, Labs, I & O's, Diet advancement  REASON FOR ASSESSMENT:   Malnutrition Screening Tool    ASSESSMENT:   87 year old female with history of hypertension hypothyroidism GERD chronic low back pain, hypertrophic obstructive cardiomyopathy. Presents with acute onset of left-sided weakness and dysarthria as well as left lateral gaze. MRI showed acute right pontine infarction. Multiple small remote bilateral cerebellar lacunar infarcts. Therapy evaluations completed due to patient's left-sided weakness dysarthria/dysphagia was admitted for a comprehensive rehab program.  Pt unavailable during attempted time of contact. Meal completion has been 50-90%. Pt currently has Ensure ordered. RD to continue with current orders to aid in caloric and protein needs. Unable to complete Nutrition-Focused physical exam at this time.   Labs and mediations reviewed.   Diet Order:   Diet Order             DIET - DYS 1 Room service appropriate? Yes; Fluid consistency: Nectar Thick  Diet effective now                   EDUCATION NEEDS:   Not appropriate for education at this time  Skin:  Skin Assessment: Reviewed RN Assessment  Last BM:  8/11  Height:   Ht Readings from Last 1 Encounters:  06/02/21 '5\' 2"'$  (1.575 m)    Weight:   Wt Readings from Last 1 Encounters:  06/01/21 58.3 kg   BMI:  Body mass index is 23.51 kg/m.  Estimated Nutritional Needs:   Kcal:  1550-1700  Protein:  75-85  grams  Fluid:  >/= 1.5 L/day  Corrin Parker, MS, RD, LDN RD pager number/after hours weekend pager number on Amion.

## 2021-06-02 NOTE — Progress Notes (Signed)
Inpatient Rehabilitation Care Coordinator Assessment and Plan Patient Details  Name: Vickie Mckee MRN: [id number] Date of Birth: 15-Aug-1931  Today's Date: 06/02/2021  Hospital Problems: Principal Problem:   Left hemiplegia [facility]) Active Problems:   Right pontine cerebrovascular accident [facility])  Past Medical History:  Past Medical History:  Diagnosis Date   Decreased sense of smell    and taste-negative CT scan-2011   Depression    Essential hypertension    GERD (gastroesophageal reflux disease)    Heart murmur    heard first time 4/12   History of mammogram    2010 and she does not want to repeat them anymore   HOCM (hypertrophic obstructive cardiomyopathy) ([place])    a. Dx by echo 05/2014.   Hypothyroidism    IBS (irritable bowel syndrome)    Impaired fasting glucose    Low back pain    Lumbar radiculopathy    with negative MRI (1991)   Postmenopausal    with osteopenia, bisphosphonates 1/05-1/11, improved osteopenia 4/12-repeat planned late 2015   Shingles    2015   Thyroid nodule    Vitamin D deficiency    repleted on weekly vitamin D 50000 iu   Past Surgical History:  Past Surgical History:  Procedure Laterality Date   ABDOMINAL HYSTERECTOMY     BREAST BIOPSY     x2   CATARACT EXTRACTION     removal with IOL bilateral   GLAUCOMA SURGERY     laser   skin cancer removal     THYROIDECTOMY     subtotal   Social History:  reports that she has never smoked. She has never used smokeless tobacco. She reports that she does not drink alcohol and does not use drugs.  Family / Support Systems Marital Status: Widow/Widower How Long?: recent Children: Vickie Mckee (Daughter), Vickie Mckee (Daughter) Anticipated Caregiver: Patient daughters Vickie Mckee and Vickie Mckee) Ability/Limitations of Caregiver: Min A Caregiver Availability: 24/7 Family Dynamics: Some family support  Social History Preferred language: English Religion:  Read: Yes Write: Yes Employment Status: Retired Special educational needs teacher Issues: n/a Guardian/Conservator: n/a   Abuse/Neglect Abuse/Neglect Assessment Can Be Completed: Yes Physical Abuse: Denies Verbal Abuse: Denies Sexual Abuse: Denies Exploitation of patient/patient's resources: Denies Self-Neglect: Denies  Emotional Status Recent Psychosocial Issues: hx of depression Psychiatric History: n/a Substance Abuse History: n/a  Patient / Family Perceptions, Expectations & Goals Pt/Family understanding of illness & functional limitations: yes Premorbid pt/family roles/activities: Independent with RW, family helps out W/ ADLs Anticipated changes in roles/activities/participation: daughter asissting with roles ans task Pt/family expectations/goals: Supervision to [employer]: None Premorbid Home Care/DME Agencies: Other (Comment) Librarian, academic, Civil engineer, contracting, Engineer, civil (consulting)) Transportation available at discharge: daughters able to transport Resource referrals recommended: Neuropsychology (coping, recently widowed)  Discharge Planning Living Arrangements: Alone Support Systems: Children Type of Residence: Private residence (1 level home, ramp entrance) Insurance Resources: [employer], Multimedia programmer (specify) Web designer) Financial Resources: [place] Referred: No Living Expenses: Own Money Management: Patient Does the patient have any problems obtaining your medications?: No Home Management: Independent Patient/Family Preliminary Plans: Daughters will asisst if needed Care Coordinator Anticipated Follow Up Needs: HH/OP, SNF Expected length of stay: 21-24 Days  Clinical Impression SW met with pt's daughter Vickie Mckee and Vickie Mckee). Both present in patient room. Introduced self explained role and addressed questions and concerns. Daughters are unsure of patient current d/c plan. Goal is to take patient home, both are only able to provide up to [place]. Daughters will  like to make  final determination after confernece to see anticipated goals patient will meet and to see how much progress pt makes. If daughter feel like that are unable to provide care, daughter will look into SNF. SW will continue to follow up.   [name] 06/02/2021, 1:19 PM

## 2021-06-02 NOTE — Plan of Care (Signed)
  Problem: RH Swallowing Goal: LTG Patient will consume least restrictive diet using compensatory strategies with assistance (SLP) Description: LTG:  Patient will consume least restrictive diet using compensatory strategies with assistance (SLP) Flowsheets (Taken 06/02/2021 1631) LTG: Pt Patient will consume least restrictive diet using compensatory strategies with assistance of (SLP): Supervision Goal: LTG Patient will participate in dysphagia therapy to increase swallow function with assistance (SLP) Description: LTG:  Patient will participate in dysphagia therapy to increase swallow function with assistance (SLP) Flowsheets (Taken 06/02/2021 1631) LTG: Pt will participate in dysphagia therapy to increase swallow function with assistance of (SLP): Supervision Goal: LTG Pt will demonstrate functional change in swallow as evidenced by bedside/clinical objective assessment (SLP) Description: LTG: Patient will demonstrate functional change in swallow as evidenced by bedside/clinical objective assessment (SLP) Flowsheets (Taken 06/02/2021 1631) LTG: Patient will demonstrate functional change in swallow as evidenced by bedside/clinical objective assessment: Oropharyngeal swallow   Problem: RH Expression Communication Goal: LTG Patient will increase speech intelligibility (SLP) Description: LTG: Patient will increase speech intelligibility at word/phrase/conversation level with cues, % of the time (SLP) Flowsheets (Taken 06/02/2021 1631) LTG: Patient will increase speech intelligibility (SLP): Supervision Level:  Phrase  Conversation level   Problem: RH Problem Solving Goal: LTG Patient will demonstrate problem solving for (SLP) Description: LTG:  Patient will demonstrate problem solving for basic/complex daily situations with cues  (SLP) Flowsheets (Taken 06/02/2021 1631) LTG: Patient will demonstrate problem solving for (SLP): Basic daily situations LTG Patient will demonstrate problem solving  for: Supervision   Problem: RH Memory Goal: LTG Patient will use memory compensatory aids to (SLP) Description: LTG:  Patient will use memory compensatory aids to recall biographical/new, daily complex information with cues (SLP) Flowsheets (Taken 06/02/2021 1631) LTG: Patient will use memory compensatory aids to (SLP): Supervision

## 2021-06-02 NOTE — Evaluation (Signed)
Physical Therapy Assessment and Plan  Patient Details  Name: Vickie Mckee MRN: [id number] Date of Birth: 03-Jan-1931  PT Diagnosis: Coordination disorder, Difficulty walking, Hemiparesis non-dominant, Hypotonia, Impaired cognition, Impaired sensation, and Muscle weakness Rehab Potential: Fair ELOS: 24-28 days   Today's Date: 06/02/2021 PT Individual Time: 2725-3664 PT Individual Time Calculation (min): 65 min    Hospital Problem: Principal Problem:   Left hemiplegia ([place]) Active Problems:   Right pontine cerebrovascular accident [facility])   Past Medical History:  Past Medical History:  Diagnosis Date   Decreased sense of smell    and taste-negative CT scan-2011   Depression    Essential hypertension    GERD (gastroesophageal reflux disease)    Heart murmur    heard first time 4/12   History of mammogram    2010 and she does not want to repeat them anymore   HOCM (hypertrophic obstructive cardiomyopathy) ([place])    a. Dx by echo 05/2014.   Hypothyroidism    IBS (irritable bowel syndrome)    Impaired fasting glucose    Low back pain    Lumbar radiculopathy    with negative MRI (1991)   Postmenopausal    with osteopenia, bisphosphonates 1/05-1/11, improved osteopenia 4/12-repeat planned late 2015   Shingles    2015   Thyroid nodule    Vitamin D deficiency    repleted on weekly vitamin D 50000 iu   Past Surgical History:  Past Surgical History:  Procedure Laterality Date   ABDOMINAL HYSTERECTOMY     BREAST BIOPSY     x2   CATARACT EXTRACTION     removal with IOL bilateral   GLAUCOMA SURGERY     laser   skin cancer removal     THYROIDECTOMY     subtotal    Assessment & Plan Clinical Impression: Patient is a 87 y.o. right-handed female with history of hypertension hypothyroidism GERD chronic low back pain, hypertrophic obstructive cardiomyopathy, some memory loss maintained on Aricept.  Per chart review patient lives alone independent with assistive device.   Two-level home bed and bath main level with a ramped entrance.  She has family and friends to assist her as needed.  Presented to [facility] 05/29/2021 with acute onset of left-sided weakness and dysarthria as well as left lateral gaze and 1 episode of vomiting.  Cranial CT scan negative for acute changes.  CT angiogram of head and neck showed no large vessel occlusion or high-grade stenosis.  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  Patient did not receive tPA.  MRI showed acute right pontine infarction.  Multiple small remote bilateral cerebellar lacunar infarcts.  Echocardiogram with ejection fraction of 60 to 65% no wall motion abnormalities.  EEG negative for seizure.  Admission chemistries alcohol negative, potassium 2.6, glucose 137, urine drug screen negative, hemoglobin A1c 5.7.  Currently maintained on aspirin 81 mg daily and Plavix 75 mg daily for CVA prophylaxis x1 month then antiplatelet monotherapy.  Subcutaneous Lovenox for DVT prophylaxis.  Dysphagia #1 nectar thick liquid diet.  Therapy evaluations completed due to patient's left-sided weakness dysarthria/dysphagia was admitted for a comprehensive rehab program..  Patient transferred to CIR on 06/01/2021 .   Patient currently requires total with mobility secondary to muscle weakness, decreased cardiorespiratoy endurance, impaired timing and sequencing, abnormal tone, unbalanced muscle activation, and decreased motor planning, ,, decreased midline orientation, decreased attention  to left, and decreased motor planning, decreased attention, decreased awareness, and decreased safety awareness, and decreased sitting balance, decreased standing balance, decreased postural control, decreased balance strategies, and hemipareisis .  Prior to hospitalization, patient was modified independent  with mobility and lived with Alone in a  House home.  Home access is  Ramped entrance.  Patient will benefit from skilled PT intervention to maximize safe functional mobility, minimize fall risk, and decrease caregiver burden for planned discharge home with 24 hour assist.  Anticipate patient will benefit from follow up [facility] at discharge.  PT - End of Session Activity Tolerance: Tolerates 10 - 20 min activity with multiple rests Endurance Deficit: Yes Endurance Deficit Description: Pt closing eyes when transitioned back to supine for LB selfcare. PT Assessment Rehab Potential (ACUTE/IP ONLY): Fair PT Barriers to Discharge: Decreased caregiver support;Home environment access/layout;Lack of/limited family support;Medication compliance PT Patient demonstrates impairments in the following area(s): Balance;Edema;Endurance;Motor;Perception;Safety;Sensory PT Transfers Functional Problem(s): Bed Mobility;Bed to Chair;Car;Furniture PT Locomotion Functional Problem(s): Ambulation;Wheelchair Mobility;Stairs PT Plan PT Intensity: Minimum of 1-2 x/day ,45 to 90 minutes PT Frequency: 5 out of 7 days PT Duration Estimated Length of Stay: 24-28 days PT Treatment/Interventions: Ambulation/gait training;Balance/vestibular training;Cognitive remediation/compensation;Community reintegration;Discharge planning;DME/adaptive equipment instruction;Functional mobility training;Disease management/prevention;Neuromuscular re-education;Pain management;Patient/family education;Psychosocial support;Splinting/orthotics;Stair training;Therapeutic Activities;Therapeutic Exercise;UE/LE Strength taining/ROM;UE/LE Coordination activities;Visual/perceptual remediation/compensation;Wheelchair propulsion/positioning PT Transfers Anticipated Outcome(s): Min A using LRAD PT Locomotion Anticipated Outcome(s): Min/ Mod A using LRAD PT Recommendation Recommendations for Other Services: Therapeutic Recreation consult Therapeutic Recreation Interventions: Stress  management;Outing/community reintergration Follow Up Recommendations: 24 hour supervision/assistance;Home health PT Patient destination: Home Equipment Recommended: To be determined   PT Evaluation Precautions/Restrictions Precautions Precautions: Fall Precaution Comments: heavy L hemiparesis, R gaze preference Restrictions Weight Bearing Restrictions: No General   Vital SignsTherapy Vitals Temp: 98 F (36.7 C) Pulse Rate: 92 Resp: (!) 22 BP: 139/69 Oxygen Therapy SpO2: 96 % O2 Device: Room Air Pain Pain Assessment Pain Scale: 0-10 Pain Score: 0-No pain Home Living/Prior Functioning Home Living Available Help at Discharge: Family;Friend(s) (2 dtrs live within 15 min of pt's home, assisted with grocery shopping/ cooking other ADLs; d/c plan TBD with pt's progress) Type of Home: House Home Access: Ramped entrance Home Layout: Two level;Able to live on main level with bedroom/bathroom Bathroom Accessibility: Yes  Lives With: Alone Prior Function Level of Independence: Independent with basic ADLs;Requires assistive device for independence;Needs assistance with homemaking Vocation: Retired Comments: Patient and family state houshold ambulation with RW, independent with basic ADL with assist to transfer into shower; family and friends assist PRN Vision/Perception  Vision - Assessment Eye Alignment: Within Functional Limits Ocular Range of Motion: Within Functional Limits Alignment/Gaze Preference: Head tilt;Head turned (head turn to the right with cervical protraction and flexion) Tracking/Visual Pursuits: Able to track stimulus in all quads without difficulty Perception Perception: Impaired Inattention/Neglect: Does not attend to left side of body;Does not attend to left visual field Praxis Praxis: Intact  Cognition Overall Cognitive Status: Impaired/Different from baseline Arousal/Alertness: Awake/alert Orientation Level: Oriented to person;Oriented to place;Oriented  to situation;Disoriented to time Attention: Sustained Sustained Attention: Impaired Memory: Impaired Memory Impairment: Storage deficit;Decreased recall of new information Decreased Short Term Memory: Verbal basic Immediate Memory Recall: Sock;Blue;Bed Memory Recall Sock: With Cue Memory Recall Blue: With Cue Memory Recall Bed: With Cue Awareness: Impaired Awareness Impairment: Intellectual impairment;Anticipatory impairment;Emergent impairment Problem Solving: Impaired Problem Solving Impairment: Verbal basic;Functional basic Safety/Judgment: Impaired Sensation Sensation Light Touch: Impaired by gross assessment Hot/Cold: Impaired by gross assessment Proprioception: Impaired by gross assessment (impaired for UE;  improved for LE) Stereognosis: Impaired by gross assessment Additional Comments: LUE>LLE Coordination Gross Motor Movements are Fluid and Coordinated: No Fine Motor Movements are Fluid and Coordinated: No Coordination and Movement Description: Pt with max hand over hand assist for LUE functional use with basic selfcare tasks. Heel Shin Test: RLE WFL, unable with LLE Motor  Motor Motor: Hemiplegia;Abnormal postural alignment and control Motor - Skilled Clinical Observations: Moderate LUE and LLE hemiparesis with UE impairment >LE   Trunk/Postural Assessment  Cervical Assessment Cervical Assessment: Exceptions to [facility] (cervical flexion with protraction and head rotation to the right) Thoracic Assessment Thoracic Assessment: Exceptions to [facility] (rounded shoulders) Lumbar Assessment Lumbar Assessment: Exceptions to [facility] (posterior pelvic tilt) Postural Control Postural Control: Deficits on evaluation Head Control: Fwd head/ neck flexion while seated upright. Unable to maintain cervical extension for > 1 min Trunk Control: Heavy bias posteriorly with intermittent minor LOB and pulls self toward R d/t L weakness.  Balance Balance Balance Assessed: Yes Static Sitting  Balance Static Sitting - Balance Support: Feet supported Static Sitting - Level of Assistance: 3: Mod assist Dynamic Sitting Balance Dynamic Sitting - Balance Support: During functional activity Dynamic Sitting - Level of Assistance: 2: Max assist Dynamic Sitting Balance - Compensations: Pulls to R Dynamic Sitting - Balance Activities: Reaching for objects Static Standing Balance Static Standing - Balance Support: Right upper extremity supported Static Standing - Level of Assistance: 2: Max assist;1: +2 Total assist (Mod A +2 using STEDY - L knee supported by lift.) Dynamic Standing Balance Dynamic Standing - Balance Support: No upper extremity supported Dynamic Standing - Level of Assistance: 1: +2 Total assist Extremity Assessment  RUE Assessment RUE Assessment: Exceptions to [facility] Passive Range of Motion (PROM) Comments: WFLS for all joints Active Range of Motion (AROM) Comments: Brunnstrum stage II in the arm with trace shoulder movement noted and stage I in the hand. General Strength Comments: Max hand over hand assist for integration into selfcare tasks at a stabilizer. LUE Assessment LUE Assessment: Within Functional Limits General Strength Comments: Not formally assessed but AROM WFLs for selfcare tasks with strength at least 3+/5 throughout RLE Assessment RLE Assessment: Exceptions to [facility] RLE Strength RLE Overall Strength: Deficits;Due to premorbid status Right Hip Flexion: 4-/5 Right Hip Extension: 4/5 Right Hip ABduction: 4/5 Right Hip ADduction: 4-/5 Right Knee Flexion: 4-/5 Right Knee Extension: 4/5 Right Ankle Dorsiflexion: 4-/5 Right Ankle Plantar Flexion: 4-/5 LLE Assessment LLE Assessment: Exceptions to WFL LLE Strength LLE Overall Strength: Deficits Left Hip Flexion: 1/5 Left Hip Extension: 1/5 Left Hip ABduction: 1/5 Left Hip ADduction: 0/5 Left Knee Flexion: 0/5 Left Knee Extension: 1/5 Left Ankle Dorsiflexion: 1/5 Left Ankle Plantar Flexion:  0/5  Care Tool Care Tool Bed Mobility Roll left and right activity   Roll left and right assist level: Total Assistance - Patient < 25%    Sit to lying activity   Sit to lying assist level: Maximal Assistance - Patient 25 - 49%    Lying to sitting edge of bed activity   Lying to sitting edge of bed assist level: Maximal Assistance - Patient 25 - 49%     Care Tool Transfers Sit to stand transfer   Sit to stand assist level: 2 Helpers    Chair/bed transfer   Chair/bed transfer assist level: 2 Helpers (STEDY)     Toilet transfer   Assist Level: 2 Helpers (STEDY)    Scientist, product/process development transfer activity did not occur: Safety/medical concerns  Care Tool Locomotion Ambulation Ambulation activity did not occur: Safety/medical concerns        Walk 10 feet activity Walk 10 feet activity did not occur: Safety/medical concerns       Walk 50 feet with 2 turns activity Walk 50 feet with 2 turns activity did not occur: Safety/medical concerns      Walk 150 feet activity Walk 150 feet activity did not occur: Safety/medical concerns      Walk 10 feet on uneven surfaces activity Walk 10 feet on uneven surfaces activity did not occur: Safety/medical concerns      Stairs Stair activity did not occur: Safety/medical concerns        Walk up/down 1 step activity Walk up/down 1 step or curb (drop down) activity did not occur: Safety/medical concerns     Walk up/down 4 steps activity did not occuR: Safety/medical concerns  Walk up/down 4 steps activity      Walk up/down 12 steps activity Walk up/down 12 steps activity did not occur: Safety/medical concerns      Pick up small objects from floor Pick up small object from the floor (from standing position) activity did not occur: Safety/medical concerns      Wheelchair Will patient use wheelchair at discharge?: Yes Type of Wheelchair: Manual Wheelchair activity did not occur: Safety/medical concerns      Wheel 50 feet with  2 turns activity Wheelchair 50 feet with 2 turns activity did not occur: Safety/medical concerns    Wheel 150 feet activity Wheelchair 150 feet activity did not occur: Safety/medical concerns      Refer to Care Plan for Long Term Goals  SHORT TERM GOAL WEEK 1 PT Short Term Goal 1 (Week 1): Pt will perform STS with Max A +1 to LRAD. PT Short Term Goal 2 (Week 1): Pt will perform supine <> sit with Mod/ Max A. PT Short Term Goal 3 (Week 1): Pt will perform functional bed <> w/c transfers with Max A +1. PT Short Term Goal 4 (Week 1): Pt will tolerate standing for > 68mn. PT Short Term Goal 5 (Week 1): Pt will initiate gait training.  Recommendations for other services: Therapeutic Recreation  Stress management and Outing/community reintegration  Skilled Therapeutic Intervention Mobility Bed Mobility Bed Mobility: Rolling Right;Rolling Left;Supine to Sit;Sit to Supine Rolling Right: Maximal Assistance - Patient 25-49% Rolling Left: Moderate Assistance - Patient 50-74% Supine to Sit: Maximal Assistance - Patient - Patient 25-49%;Total Assistance - Patient < 25% Sit to Supine: Maximal Assistance - Patient 25-49%;Total Assistance - Patient < 25% Transfers Transfers: Sit to Stand;Stand to Sit Sit to Stand: 2 Helpers Stand to Sit: 2 Helpers Squat Pivot Transfers: Total Assistance - Patient < 25% Transfer (Assistive device): Other (Comment) (STEDY) Locomotion  Gait Ambulation: No Gait Gait: No Stairs / Additional Locomotion Stairs: No Wheelchair Mobility Wheelchair Mobility: No  PT Evaluation completed; see above for results. PT educated patient in roles of PT vs OT, PT POC, rehab potential, rehab goals, and discharge recommendations along with recommendation for follow-up rehabilitation services. Individual treatment initiated:  Patient supine in bed upon PT arrival. Two daughters present in room. Patient alert and agreeable to PT session. No pain complaint throughout  session.  Therapeutic Activity: Bed Mobility: Patient performed supine <> sit requiring heavy vc/ tc for technique and Max A +2 to perform and then maintain sitting balance initially. Sitting balance improves with bil feet to floor and time in sitting.  Transfers: Patient performed STS throughout session with use  of STEDY and Mod A +2. Provided verbal cues for technique of use of RUE to pull and use of BLE to complete extensor push into full upright stance. Requires assist for full hip extension and vc for neck extension to improve upright posture.  Neuromuscular Re-ed: NMR facilitated during session with focus on sitting balance, proprioception, midline orientation. Pt guided in seated awareness of position in space. Slow melt to LOB posteriorly prior to placing Bil feet on floor and once after. Guided in leans to R and L as well as scooting fwd/ bkwd and weight shifting for unweighting to complete movements fwd/ bkwd. Modified stance in STEDY and use of mirror for pt to improve midline orientation and upright posture with good results but continued vc/ tc especially for correcting forward neck flexion and rotation to R. NMR performed for improvements in motor control and coordination, balance, sequencing, judgement, and self confidence/ efficacy in performing all aspects of mobility at highest level of independence.   Patient supine  in bed at end of session with brakes locked, bed alarm set, and all needs within reach.   Discharge Criteria: Patient will be discharged from PT if patient refuses treatment 3 consecutive times without medical reason, if treatment goals not met, if there is a change in medical status, if patient makes no progress towards goals or if patient is discharged from hospital.  The above assessment, treatment plan, treatment alternatives and goals were discussed and mutually agreed upon: by patient  Alger Simons PT, DPT 06/02/2021, 7:40 PM

## 2021-06-02 NOTE — Progress Notes (Signed)
Orthopedic Tech Progress Note Patient Details:  Vickie Mckee [birth year] [id number] Applied WHO to LUE and PRAFO to LLE. Ortho Devices Type of Ortho Device: Velcro wrist splint, Prafo boot/shoe Ortho Device/Splint Location: LUE, LLE Ortho Device/Splint Interventions: Ordered, Application   Post Interventions Patient Tolerated: Well Instructions Provided: Adjustment of device  Petra Kuba 06/02/2021, 10:06 AM

## 2021-06-02 NOTE — Progress Notes (Signed)
[facility] Individual Statement of Services  Patient Name:  Vickie Mckee  Date:  06/02/2021  Welcome to the [facility].  Our goal is to provide you with an individualized program based on your diagnosis and situation, designed to meet your specific needs.  With this comprehensive rehabilitation program, you will be expected to participate in at least 3 hours of rehabilitation therapies Monday-Friday, with modified therapy programming on the weekends.  Your rehabilitation program will include the following services:  Physical Therapy (PT), Occupational Therapy (OT), Speech Therapy (ST), 24 hour per day rehabilitation nursing, Therapeutic Recreaction (TR), Neuropsychology, Care Coordinator, Rehabilitation Medicine, Nutrition Services, Pharmacy Services, and Other  Weekly team conferences will be held on Wednesdays to discuss your progress.  Your Inpatient Rehabilitation Care Coordinator will talk with you frequently to get your input and to update you on team discussions.  Team conferences with you and your family in attendance may also be held.  Expected length of stay:  21-24 Days  Overall anticipated outcome:  Supervision to Min A  Depending on your progress and recovery, your program may change. Your Inpatient Rehabilitation Care Coordinator will coordinate services and will keep you informed of any changes. Your Inpatient Rehabilitation Care Coordinator's name and contact numbers are listed  below.  The following services may also be recommended but are not provided by the [facility]:   [place] will be made to provide these services after discharge if needed.  Arrangements include referral to agencies that provide these services.  Your insurance has been verified to be:  Medicare A & B Your primary doctor is:  Seward Carol, MD  Pertinent  information will be shared with your doctor and your insurance company.  Inpatient Rehabilitation Care Coordinator:  Erlene Quan, [place] or [phone]  Information discussed with and copy given to patient by: [name], 06/02/2021, 11:37 AM

## 2021-06-02 NOTE — Progress Notes (Addendum)
PROGRESS NOTE   Subjective/Complaints: She is having some left sided spasms: discussed that we will supplement her potassium today and see if that helps, and have ordered PRAFO and wrist splint. Discussed that we can try anti-spasticity medications if they continue to bother her  ROS: left sided spasms   Objective:   No results found. Recent Labs    06/01/21 1513 06/02/21 0510  WBC 9.4 8.4  HGB 13.7 13.4  HCT 41.5 38.3  PLT 262 263   Recent Labs    06/01/21 0456 06/01/21 1513 06/02/21 0510  NA 138  --  137  K 3.1*  --  3.7  CL 108  --  107  CO2 26  --  23  GLUCOSE 121*  --  110*  BUN 16  --  15  CREATININE 0.77 0.74 0.74  CALCIUM 8.2*  --  8.6*   No intake or output data in the 24 hours ending 06/02/21 0918      Physical Exam: Vital Signs Blood pressure (!) 155/73, pulse 77, temperature 97.7 F (36.5 C), temperature source Oral, resp. rate 16, weight 58.3 kg, SpO2 95 %. Gen: no distress, normal appearing HEENT: Head tilt noted to R- chin to left at rest- took some coaxing to go to midline.  L nasolabial fold flatter; and L facial droop at rest, but improved with smile; tongue to L slightly.  Cardio: Reg rate Chest: normal effort, normal rate of breathing Abd: soft, non-distended Ext: no edema Psych: pleasant, normal affect Musculoskeletal:     Comments: RUE 5/5 RLE- 5-/5  LUE 0/5 LLE- HF 1/5; KE/KF 0/5, DF/PF 4-/5 Harder to extend L knee, but doesn't feel like spasticity  Skin:    General: Skin is warm and dry.     Comments: IV in L AC fossa- looks OK- a lot of bruising from needle sticks in B/L arms  Neurological:     Mental Status: She is alert.     Comments: Patient is alert.  Severely dysarthric.  Follows commands.  Provides her name but had difficulty with month. Very decreased/absent sensation to light touch in LUE; less sensation  in LLE- but better than LUE.  Intact to light touch on  RUE/RLE; also decreased on L face  Psychiatric:     Comments: Slightly delayed responses, but joking some;    Assessment/Plan: 1. Functional deficits which require 3+ hours per day of interdisciplinary therapy in a comprehensive inpatient rehab setting. Physiatrist is providing close team supervision and 24 hour management of active medical problems listed below. Physiatrist and rehab team continue to assess barriers to discharge/monitor patient progress toward functional and medical goals  Care Tool:  Bathing              Bathing assist       Upper Body Dressing/Undressing Upper body dressing   What is the patient wearing?: Hospital gown only    Upper body assist Assist Level: Moderate Assistance - Patient 50 - 74%    Lower Body Dressing/Undressing Lower body dressing      What is the patient wearing?: Incontinence brief     Lower body assist Assist for lower body dressing: Maximal  Assistance - Patient 25 - 49%     Toileting Toileting    Toileting assist Assist for toileting: Total Assistance - Patient < 25%     Transfers Chair/bed transfer  Transfers assist           Locomotion Ambulation   Ambulation assist              Walk 10 feet activity   Assist           Walk 50 feet activity   Assist           Walk 150 feet activity   Assist           Walk 10 feet on uneven surface  activity   Assist           Wheelchair     Assist               Wheelchair 50 feet with 2 turns activity    Assist            Wheelchair 150 feet activity     Assist          Blood pressure (!) 155/73, pulse 77, temperature 97.7 F (36.5 C), temperature source Oral, resp. rate 16, weight 58.3 kg, SpO2 95 %.      Medical Problem List and Plan: 1.   Left-sided weakness with dysarthria/dysphagia secondary to right pontine infarction as well as multiple small remote bilateral cerebellar lacunar infarcts              -patient may  shower             -ELOS/Goals: 21-24 days min Assist  -Initial CIR evaluations today 2.  Impaired mobility: Continue Lovenox for DVT prophylaxis             -antiplatelet therapy: Aspirin 81 mg daily and Plavix 75 mg day x1 month then Plavix alone 3. Pain Management/chronic back pain: Tylenol as needed 4. Depression: Continue Zoloft 100 mg daily, Aricept 5 mg nightly             -antipsychotic agents: N/A 5. Neuropsych: This patient is capable of making decisions on her own behalf. 6. Skin/Wound Care: Routine skin checks 7. Fluids/Electrolytes/Nutrition: Routine in and outs with follow-up chemistries 8.  Dysphagia.  Dysphagia #1 nectar thick liquids.  Follow-up speech therapy.  Monitor hydration 9.  Permissive hypertension.  Patient on verapamil 240 mg daily prior to admission as well as HCTZ 25 mg daily.  Resume as needed, K+ supplement today will help with BP 10.  Hyperlipidemia.  Lipitor 11.  Hypothyroidism.  TSH 2.093 .Continue Synthroid 12.  Constipation.  MiraLAX scheduled daily.  Senokot S2 tablets nightly patient did receive Dulcolax suppository 06/01/2021- per pt, LBM 2 days ago 13. Hypokalemia: will supplement 83mq today, repeat BMP on Monday. Discussed high potassium foods with daughters.  14. Crackles and coughing with food intake: placed nursing order for must be up with meals, discussed with nursing, 2V CXR ordered, afebrile and without leukocytosis.   LOS: 1 days A FACE TO FACE EVALUATION WAS PERFORMED  KClide DeutscherRaulkar 06/02/2021, 9:18 AM

## 2021-06-02 NOTE — Evaluation (Signed)
Occupational Therapy Assessment and Plan  Patient Details  Name: Vickie Mckee MRN: [id number] Date of Birth: [birth year]  OT Diagnosis: abnormal posture, acute pain, altered mental status, cognitive deficits, disturbance of vision, flaccid hemiplegia and hemiparesis, and muscle weakness (generalized) Rehab Potential: Rehab Potential (ACUTE ONLY): Good ELOS: 24-28 days   Today's Date: 06/02/2021 OT Individual Time: 0800-0906 OT Individual Time Calculation (min): 66 min     Hospital Problem: Principal Problem:   Left hemiplegia ([place]) Active Problems:   Right pontine cerebrovascular accident [facility])   Past Medical History:  Past Medical History:  Diagnosis Date   Decreased sense of smell    and taste-negative CT scan-2011   Depression    Essential hypertension    GERD (gastroesophageal reflux disease)    Heart murmur    heard first time 4/12   History of mammogram    2010 and she does not want to repeat them anymore   HOCM (hypertrophic obstructive cardiomyopathy) ([place])    a. Dx by echo 05/2014.   Hypothyroidism    IBS (irritable bowel syndrome)    Impaired fasting glucose    Low back pain    Lumbar radiculopathy    with negative MRI (1991)   Postmenopausal    with osteopenia, bisphosphonates 1/05-1/11, improved osteopenia 4/12-repeat planned late 2015   Shingles    2015   Thyroid nodule    Vitamin D deficiency    repleted on weekly vitamin D 50000 iu   Past Surgical History:  Past Surgical History:  Procedure Laterality Date   ABDOMINAL HYSTERECTOMY     BREAST BIOPSY     x2   CATARACT EXTRACTION     removal with IOL bilateral   GLAUCOMA SURGERY     laser   skin cancer removal     THYROIDECTOMY     subtotal    Assessment & Plan Clinical Impression: Patient is a 87 y.o. year old female with recent admission to the hospital on 05/29/2021 with acute onset of left-sided weakness and dysarthria as well as left lateral gaze and 1 episode of vomiting.  Cranial CT scan  negative for acute changes.  CT angiogram of head and neck showed no large vessel occlusion or high-grade stenosis.  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  Patient did not receive tPA.  MRI showed acute right pontine infarction.  Multiple small remote bilateral cerebellar lacunar infarcts..  Patient transferred to CIR on 06/01/2021 .    Patient currently requires total with basic self-care skills secondary to muscle weakness, muscle joint tightness, and muscle paralysis, impaired timing and sequencing, abnormal tone, unbalanced muscle activation, and decreased coordination, field cut and hemianopsia, decreased attention to left and left side neglect, decreased attention, decreased awareness, decreased problem solving, decreased safety awareness, decreased memory, and delayed processing, and decreased sitting balance, decreased standing balance, decreased postural control, hemiplegia, and decreased balance strategies.  Prior to hospitalization, patient could complete ADLs with supervision.  Patient will benefit from skilled intervention to decrease level of assist with basic self-care skills and increase independence with basic self-care skills prior to discharge home with care partner.  Anticipate patient will require minimal physical assistance and moderate physical assestance and follow up home health.  OT - End of Session Activity Tolerance: Decreased this session Endurance Deficit: Yes Endurance Deficit Description: Pt closing eyes when transitioned back to supine for LB  selfcare. OT Assessment Rehab Potential (ACUTE ONLY): Good OT Barriers to Discharge: Decreased caregiver support OT Barriers to Discharge Comments: Pt lived alone with occasional assist from her daugthers.  Will need 24 hr min to mod assist at discharge. OT Patient demonstrates impairments in the following  area(s): Balance;Perception;Safety;Cognition;Sensory;Endurance;Motor;Pain;Vision OT Basic ADL's Functional Problem(s): Eating;Grooming;Bathing;Dressing;Toileting OT Transfers Functional Problem(s): Toilet;Tub/Shower OT Additional Impairment(s): Fuctional Use of Upper Extremity OT Plan OT Intensity: Minimum of 1-2 x/day, 45 to 90 minutes OT Frequency: 5 out of 7 days OT Duration/Estimated Length of Stay: 24-28 days OT Treatment/Interventions: Balance/vestibular training;Cognitive remediation/compensation;Discharge planning;DME/adaptive equipment instruction;Disease mangement/prevention;Functional electrical stimulation;Functional mobility training;Neuromuscular re-education;Patient/family education;Pain management;Self Care/advanced ADL retraining;Splinting/orthotics;UE/LE Strength taining/ROM;Therapeutic Exercise;Therapeutic Activities;UE/LE Coordination activities;Visual/perceptual remediation/compensation OT Self Feeding Anticipated Outcome(s): supervision OT Basic Self-Care Anticipated Outcome(s): Min to mod assist OT Toileting Anticipated Outcome(s): Min to mod assist OT Bathroom Transfers Anticipated Outcome(s): Min assist OT Recommendation Recommendations for Other Services: Therapeutic Recreation consult Therapeutic Recreation Interventions: Stress management Patient destination: Home Follow Up Recommendations: 24 hour supervision/assistance Equipment Recommended: To be determined   OT Evaluation Precautions/Restrictions  Precautions Precautions: Fall Precaution Comments: left hemiparesis, right gaze Restrictions Weight Bearing Restrictions: No  Pain  See Pain flowsheet for details Home Living/Prior [place] expects to be discharged to:: Private residence Living Arrangements: Alone Available Help at Discharge: Family (Daughters in the area and assisted prior to admission for IADLs and for shower transfers) Type of Home: House Home Access:  Ramped entrance Home Layout: Two level, Able to live on main level with bedroom/bathroom Bathroom Accessibility: Yes  Lives With: Alone IADL History Homemaking Responsibilities: No Occupation: Retired Prior Function Level of Independence: Independent with basic ADLs Comments: Patient and family state houshold ambulation with RW, independent with basic ADL with assist to transfer into shower; family and friends assist PRN Vision Baseline Vision/History: Wears glasses Wears Glasses: At all times Patient Visual Report: No change from baseline Vision Assessment?: Vision impaired- to be further tested in functional context Alignment/Gaze Preference: Head tilt;Head turned (head turn to the right with cervical protraction and flexion) Perception  Perception: Impaired Inattention/Neglect: Does not attend to left side of body;Does not attend to left visual field Praxis Praxis: Intact Cognition Overall Cognitive Status: Impaired/Different from baseline Arousal/Alertness: Awake/alert Orientation Level: Person;Place;Situation Person: Oriented Place: Disoriented Situation: Oriented Year: 2016 Month: January Day of Week: Incorrect (Monday) Memory: Impaired Memory Impairment: Storage deficit;Decreased recall of new information Decreased Short Term Memory: Verbal basic Immediate Memory Recall: Sock;Blue;Bed Memory Recall Sock: With Cue Memory Recall Blue: With Cue Memory Recall Bed: With Cue Attention: Sustained Sustained Attention: Impaired Sustained Attention Impairment: Verbal basic;Functional basic Awareness: Impaired Awareness Impairment: Intellectual impairment;Anticipatory impairment;Emergent impairment Problem Solving: Impaired Problem Solving Impairment: Verbal basic;Functional basic Safety/Judgment: Impaired Sensation Sensation Light Touch: Impaired by gross assessment Hot/Cold: Impaired by gross assessment Proprioception: Impaired by gross assessment Stereognosis:  Impaired by gross assessment Additional Comments: Sensation impaired per gross assessment on the left side, but did not formally test. Coordination Gross Motor Movements are Fluid and Coordinated: No Fine Motor Movements are Fluid and Coordinated: No Coordination and Movement Description: Pt with max hand over hand assist for LUE functional use with basic selfcare tasks. Motor  Motor Motor: Hemiplegia;Abnormal postural alignment and control Motor - Skilled Clinical Observations: Moderate LUE and LLE hemiparesis  Trunk/Postural Assessment  Cervical Assessment Cervical Assessment: Exceptions to [facility] (cervical flexion with protraction and head rotation to the right) Thoracic Assessment Thoracic Assessment: Exceptions to [facility] (thoracic rounding) Lumbar Assessment Lumbar Assessment: Exceptions to [facility] (  posterior pelvic tilt) Postural Control Postural Control: Deficits on evaluation Head Control: head falls into flexion requiring cueing to correct.  Unable to maintain cervical extension for greater than 30 secs to 1 min Trunk Control: LOB to the left with max assist to correct during selfcare tasks.  Balance Balance Balance Assessed: Yes Static Sitting Balance Static Sitting - Balance Support: Feet supported Static Sitting - Level of Assistance: 3: Mod assist Dynamic Sitting Balance Dynamic Sitting - Balance Support: During functional activity Dynamic Sitting - Level of Assistance: 2: Max assist Static Standing Balance Static Standing - Balance Support: No upper extremity supported Static Standing - Level of Assistance: 1: +2 Total assist Dynamic Standing Balance Dynamic Standing - Balance Support: No upper extremity supported Dynamic Standing - Level of Assistance: 1: +2 Total assist Extremity/Trunk Assessment RUE Assessment RUE Assessment: Exceptions to [facility] Passive Range of Motion (PROM) Comments: WFLS for all joints Active Range of Motion (AROM) Comments: Brunnstrum stage II in the  arm with trace shoulder movement noted and stage I in the hand. General Strength Comments: Max hand over hand assist for integration into selfcare tasks at a stabilizer. LUE Assessment LUE Assessment: Within Functional Limits General Strength Comments: Not formally assessed but AROM WFLs for selfcare tasks with strength at least 3+/5 throughout  Care Tool Care Tool Self Care Eating   Eating Assist Level: Moderate Assistance - Patient 50 - 74%    Oral Care    Oral Care Assist Level: Maximal assistance - Patient 25 - 49%    Bathing   Body parts bathed by patient: Chest;Abdomen;Right upper leg;Left upper leg Body parts bathed by helper: Buttocks;Front perineal area;Right lower leg;Left lower leg;Face;Right arm;Left arm   Assist Level: 2 Helpers    Upper Body Dressing(including orthotics)   What is the patient wearing?: Button up shirt   Assist Level: Total Assistance - Patient < 25%    Lower Body Dressing (excluding footwear)   What is the patient wearing?: Incontinence brief;Pants Assist for lower body dressing: 2 Helpers    Putting on/Taking off footwear   What is the patient wearing?: Non-skid slipper socks;Ted hose Assist for footwear: Dependent - Patient 0%       Care Tool Toileting Toileting activity   Assist for toileting: 2 Helpers     Care Tool Bed Mobility Roll left and right activity   Roll left and right assist level: Total Assistance - Patient < 25%    Sit to lying activity   Sit to lying assist level: Total Assistance - Patient < 25%    Lying to sitting edge of bed activity   Lying to sitting edge of bed assist level: Total Assistance - Patient < 25%     Care Tool Transfers Sit to stand transfer   Sit to stand assist level: 2 Helpers    Chair/bed transfer   Chair/bed transfer assist level: 2 Helpers (squat pivot)     Toilet transfer   Assist Level: 2 Helpers (squat pivot to drop arm commode)     Care Tool Cognition Expression of Ideas and Wants  Expression of Ideas and Wants: Some difficulty - exhibits some difficulty with expressing needs and ideas (e.g, some words or finishing thoughts) or speech is not clear   Understanding Verbal and Non-Verbal Content Understanding Verbal and Non-Verbal Content: Usually understands - understands most conversations, but misses some part/intent of message. Requires cues at times to understand   Memory/Recall Ability *first 3 days only Memory/Recall Ability *first 3 days only:  That he or she is in a hospital/hospital unit    Refer to Care Plan for [place] 1 OT Short Term Goal 1 (Week 1): Pt will maintain static sitting balance EOB with close supervision for at least 2 mins in preparation for selfcare tasks. OT Short Term Goal 2 (Week 1): Pt will complete UB bathing sitting supported in wheelchair with min assist for 2 consecutive sessions. OT Short Term Goal 3 (Week 1): Pt will complete UB dressing with mod assist for donning a pullover shirt following hemi dressing techniques. OT Short Term Goal 4 (Week 1): Pt's family will return demonstrate safe assist with AAROM/PROM exercises for the RUE. OT Short Term Goal 5 (Week 1): Pt will complete squat pivot transfer to the drop arm commode with max assist.  Recommendations for other services: Therapeutic Recreation  Stress management   Skilled Therapeutic Intervention ADL ADL Eating: Moderate assistance Where Assessed-Eating: Bed level Grooming: Maximal assistance Where Assessed-Grooming: Edge of bed Upper Body Bathing: Moderate assistance Where Assessed-Upper Body Bathing: Edge of bed Lower Body Bathing: Dependent Where Assessed-Lower Body Bathing: Bed level;Edge of bed Upper Body Dressing: Dependent Where Assessed-Upper Body Dressing: Edge of bed Lower Body Dressing: Dependent Where Assessed-Lower Body Dressing: Edge of bed Toileting: Dependent Where Assessed-Toileting: Bedside Commode Toilet Transfer:  Dependent Toilet Transfer Method: Engineer, water: Radiographer, therapeutic: Not assessed Social research officer, government: Not assessed Mobility  Bed Mobility Bed Mobility: Rolling Right;Rolling Left;Supine to Sit Rolling Right: Total Assistance - Patient < 25% Rolling Left: Moderate Assistance - Patient 50-74% Supine to Sit: Total Assistance - Patient < 25% Transfers Sit to Stand: 2 Helpers Stand to Sit: 2 Helpers  Session Note:  Pt in bed to start session with her daughters present.  Pt requesting to toilet, so completed transfer from supine to sit with total assist on the right side of the bed and then squat pivot to the drop arm commode.  Total +2 (pt 20%) for sit to stand to manage brief.  She was unsuccessful with attempt and was transferred back to the EOB with total assist to work on selfcare tasks.  Increased cervical flexion and protraction at rest with head rotation to the right in sitting.  Mod assist for static sitting balance with max assist for dynamic once she started working on bathing.  Increased LOB to the left with inability to self correct, however she could state that she was falling to the left.  Mod assist for UB bathing with total assist for donning a button up shirt.  She was able to wash her upper legs, but then returned to supine to complete the rest of LB selfcare secondary to not being able to stand efficiently with one therapist.  Mod assist for rolling to the left with total assist for rolling to the right as therapist provided total assist for washing buttocks and donning all LB clothing.  Finished session with pt in the bed and nursing present.  Noted pt with one skin abrasion on her left malleolus which was passed on to nursing.  Also discussed with MD need for resting hand splint and left PRAFO to assist with positioning.  Also noted with pt having LLE spasms with dorsiflexion and knee flexion occurring spontaneously.    Discharge  Criteria: Patient will be discharged from OT if patient refuses treatment 3 consecutive times without medical reason, if treatment goals not met, if there is a change in medical status, if  patient makes no progress towards goals or if patient is discharged from hospital.  The above assessment, treatment plan, treatment alternatives and goals were discussed and mutually agreed upon: by patient and by family  Banyan Goodchild OTR/L 06/02/2021, 4:31 PM

## 2021-06-02 NOTE — Evaluation (Addendum)
Speech Language Pathology Assessment and Plan  Patient Details  Name: Vickie Mckee MRN: [id number] Date of Birth: 09-27-1931  SLP Diagnosis: Dysarthria;Cognitive Impairments;Dysphagia  Rehab Potential: Good ELOS: 24-28 days    Today's Date: 06/02/2021 SLP Individual Time: 7829-5621 SLP Individual Time Calculation (min): 106 min   Hospital Problem: Principal Problem:   Left hemiplegia ([place]) Active Problems:   Right pontine cerebrovascular accident [facility])  Past Medical History:  Past Medical History:  Diagnosis Date   Decreased sense of smell    and taste-negative CT scan-2011   Depression    Essential hypertension    GERD (gastroesophageal reflux disease)    Heart murmur    heard first time 4/12   History of mammogram    2010 and she does not want to repeat them anymore   HOCM (hypertrophic obstructive cardiomyopathy) ([place])    a. Dx by echo 05/2014.   Hypothyroidism    IBS (irritable bowel syndrome)    Impaired fasting glucose    Low back pain    Lumbar radiculopathy    with negative MRI (1991)   Postmenopausal    with osteopenia, bisphosphonates 1/05-1/11, improved osteopenia 4/12-repeat planned late 2015   Shingles    2015   Thyroid nodule    Vitamin D deficiency    repleted on weekly vitamin D 50000 iu   Past Surgical History:  Past Surgical History:  Procedure Laterality Date   ABDOMINAL HYSTERECTOMY     BREAST BIOPSY     x2   CATARACT EXTRACTION     removal with IOL bilateral   GLAUCOMA SURGERY     laser   skin cancer removal     THYROIDECTOMY     subtotal    Assessment / Plan / Recommendation Clinical Impression  HPI: Vickie Mckee is an 87 year old right-handed female with history of hypertension hypothyroidism GERD chronic low back pain, hypertrophic obstructive cardiomyopathy, some memory loss maintained on Aricept.  Per chart review patient lives alone independent with assistive device.  Two-level home bed and bath main level with a ramped  entrance.  She has family and friends to assist her as needed.  [facility]ed [facility]/20[facility] with acute onset of left-sided weakness and dysarthria as well as left lateral gaze and 1 episode of vomiting.  Cranial CT scan negative for acute changes.  CT angiogram of head and neck showed no large vessel occlusion or high-grade stenosis.  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  Patient did not receive tPA.  MRI showed acute right pontine infarction.  Multiple small remote bilateral cerebellar lacunar infarcts.  Echocardiogram with ejection fraction of 60 to 65% no wall motion abnormalities.  EEG negative for seizure.  Admission chemistries alcohol negative, potassium 2.6, glucose 137, urine drug screen negative, hemoglobin A1c 5.7.  Currently maintained on aspirin 81 mg daily and Plavix 75 mg daily for CVA prophylaxis x1 month then antiplatelet monotherapy.  Subcutaneous Lovenox for DVT prophylaxis.  Dysphagia #1 nectar thick liquid diet.  Therapy evaluations completed due to patient's left-sided weakness dysarthria/dysphagia was admitted for a comprehensive rehab program.  Patient seen for clinical bedside swallow eval, speech, and informal cognitive-linguistic assessment. Prior to eval nurse notified SLP that patient exhibited excessive coughing with crushed meds in puree and nectar thick liquid rinse this a.m. Patient was accompanied by her two daughters who endorsed very occasional coughing  with NTL since diet advancement to Dys 1 and NTL following MBS on 8/9. Daughters have been feeding patient. Patient alert and cooperative during eval and complained of drowsiness. She consumed 4oz NTL by cup via single sips with no overt s/sx of aspiration. She exhibited timely swallow response and clear vocal quality post swallows. She exhibited coughing x1 with NT coffee at end of session  with no further coughing episodes. Tolerated puree with functional oral and pharyngeal phase and no overt s/sx of aspiration. Patient performed self feeding during PO trials with sup A verbal cues to ensure small bolus volume and feeding rate. Nurse and family indicated that patient was being fed when she exhibited coughing episode thus suspect this could have attributed to having less control of bolus volume and/or readiness to receive bolus. Recommend continuation of dys 1 diet and nectar thick liquids. Patient is recommended to self feed to maximize readiness and control. Full supervision for implementation of swallow safety. SLP to closely monitor PO tolerance.   Patient exhibits mild-to-moderate dysarthria characterized primarily by decreased vocal intensity and articulatory imprecision which negatively impacts speech intelligibility at sentence level. Patient's verbal expression appeared grossly intact,  and she required verbal repetition for following directions and increased processing time from an auditory comprehension standpoint. SLP unable to perform formal cognitive assessment due to time constraints, however per informal assessment patient exhibited decreased orientation, attention, short-term recall, left inattention, and decreased insight into deficits. Per supporting documentation, patient completed SLUMS on 8/9 with score of 3/26 suggestive of significant cognitive-linguistic impairment. Recommend ongoing assessment.   Patient would benefit from skilled SLP intervention to maximize swallow, speech, and cognitive-linguistic functions. Anticipate patient will require 24 hour supervision at home and follow up [facility] SLP services.     Skilled Therapeutic Interventions          SLP completed clinical BSE, speech, and cognitive-linguistic assessment. Please see above.    SLP Assessment  Patient will need skilled Speech Lanaguage Pathology Services during CIR admission    Recommendations  SLP Diet  Recommendations: Nectar;Dysphagia 1 (Puree) Liquid Administration via: Cup Medication Administration: Crushed with puree Supervision: Patient able to self feed;Full supervision/cueing for compensatory strategies Compensations: Minimize environmental distractions;Slow rate;Small sips/bites Postural Changes and/or Swallow Maneuvers: Seated upright 90 degrees;Upright 30-60 min after meal Oral Care Recommendations: Oral care BID Patient destination: Home Follow up Recommendations: Home Health SLP;24 hour supervision/assistance Equipment Recommended: None recommended by SLP    SLP Frequency 3 to 5 out of 7 days   SLP Duration  SLP Intensity  SLP Treatment/Interventions 24-28 days  Minumum of 1-2 x/day, 30 to 90 minutes  Cognitive remediation/compensation;Environmental controls;Internal/external aids;Speech/Language facilitation;Cueing hierarchy;Dysphagia/aspiration precaution training;Functional tasks;Patient/family education;Medication managment;Therapeutic Activities    Pain - patient reports no discomfort    Prior Functioning Type of Home: House  Lives With: Alone Available Help at Discharge: Family (Daughters in the area and assisted prior to admission for IADLs and for shower transfers)  SLP Evaluation Cognition Overall Cognitive Status: Impaired/Different from baseline Arousal/Alertness: Awake/alert Attention: Sustained Sustained Attention: Impaired Memory: Impaired Memory Impairment: Storage deficit;Decreased recall of new information Decreased Short Term Memory: Verbal basic Immediate Memory Recall: Sock;Blue;Bed Memory Recall Sock: With Cue Memory Recall Blue: With Cue Memory Recall Bed: With Cue Awareness: Impaired Awareness Impairment: Intellectual impairment;Anticipatory impairment;Emergent impairment Problem Solving: Impaired Problem Solving Impairment: Verbal basic;Functional basic Safety/Judgment: Impaired  Comprehension Auditory Comprehension Overall  Auditory Comprehension: Appears within functional limits for tasks assessed (requires additional processing time) Expression Written Expression Dominant Hand: Right  Oral Motor Oral Motor/Sensory Function Overall Oral Motor/Sensory Function: Mild impairment Facial ROM: Reduced left Facial Symmetry: Abnormal symmetry left Facial Strength: Reduced left Facial Sensation: Reduced left Lingual ROM: Reduced left Lingual Symmetry: Abnormal symmetry left Lingual Strength: Reduced Lingual Sensation: Reduced Motor Speech Overall Motor Speech: Impaired Respiration: Impaired Level of Impairment: Phrase Phonation: Low vocal intensity Resonance: Within functional limits Articulation: Impaired Level of Impairment: Phrase Intelligibility: Intelligibility reduced Word: 75-100% accurate Phrase: 75-100% accurate Sentence: 50-74% accurate Conversation: 50-74% accurate Motor Planning: Witnin functional limits Motor Speech Errors: Not applicable Effective Techniques: Increased vocal intensity  Care Tool Care Tool Cognition Expression of Ideas and Wants Expression of Ideas and Wants: Some difficulty - exhibits some difficulty with expressing needs and ideas (e.g, some words or finishing thoughts) or speech is not clear   Understanding Verbal and Non-Verbal Content Understanding Verbal and Non-Verbal Content: Usually understands - understands most conversations, but misses some part/intent of message. Requires cues at times to understand   Memory/Recall Ability *first 3 days only Memory/Recall Ability *first 3 days only: That he or she is in a hospital/hospital unit    Intelligibility: Intelligibility reduced Word: 75-100% accurate Phrase: 75-100% accurate Sentence: 50-74% accurate Conversation: 50-74% accurate  Bedside Swallowing Assessment General Date of Onset: 05/29/21 Previous Swallow Assessment: 08/09 Modified Barium Swallow Study Behavior/Cognition: Alert;Cooperative;Pleasant  mood Oral Cavity - Dentition: Adequate natural dentition Vision: Functional for self-feeding Baseline Vocal Quality: Low vocal intensity Volitional Cough: Strong Volitional Swallow: Able to elicit  Oral Care Assessment Does patient have any of the following "high(er) risk" factors?: None of the above Does patient have any of the following "at risk" factors?: None of the above Patient is LOW RISK: Follow universal precautions (see row information) Thin Liquid: Not tested Nectar Thick Nectar Thick Liquid: Impaired Presentation: Cup Pharyngeal Phase Impairments: Cough - Immediate Honey Thick Honey Thick Liquid: Not tested Puree Puree: Within functional limits Presentation: Spoon Solid Solid: Not tested BSE Assessment Risk for Aspiration Impact on safety and function: Moderate aspiration risk Other Related Risk Factors: Lethargy;Cognitive impairment  Short Term Goals: Week 1: SLP Short Term Goal 1 (Week 1): Patient will consistently demonstrate orientation x4 with min A verbal cues and use of compensatory aids SLP Short Term Goal 2 (Week 1): Patient will demonstrate basic problem solving for basic tasks with min A verbal cues SLP Short Term Goal 3 (Week 1): Patient will use breath support techniques to increase vocal intensity at sentence level to achieve 90% intelligibility with sup A verbal cues SLP Short Term Goal 4 (Week 1): Patient will tolerate current diet with minimal-to-no overt s/sx of aspiration with min A verbal cues for use of swallowing compensatory strategies SLP Short Term Goal 5 (Week 1): Patient will demonstrate effective mastication and oral clearance of Dys 2 trials with no overt s/sx of aspiration over at least 2 observed sessions to demonstrate readiness for diet advancement.  Refer to Care Plan for Long Term Goals  Recommendations for other services: None   Discharge Criteria: Patient will be discharged from SLP if patient refuses treatment 3 consecutive times  without medical reason, if treatment goals not met, if there is a change in medical status, if patient makes no progress towards goals or if patient is discharged from hospital.  The above assessment, treatment plan, treatment alternatives and goals were discussed and mutually agreed upon: by patient and by family  Patty Sermons 06/02/2021, 4:37 PM

## 2021-06-03 DIAGNOSIS — E46 Unspecified protein-calorie malnutrition: Secondary | ICD-10-CM

## 2021-06-03 DIAGNOSIS — I69391 Dysphagia following cerebral infarction: Secondary | ICD-10-CM

## 2021-06-03 DIAGNOSIS — E8809 Other disorders of plasma-protein metabolism, not elsewhere classified: Secondary | ICD-10-CM

## 2021-06-03 DIAGNOSIS — E876 Hypokalemia: Secondary | ICD-10-CM

## 2021-06-03 DIAGNOSIS — I6350 Cerebral infarction due to unspecified occlusion or stenosis of unspecified cerebral artery: Secondary | ICD-10-CM

## 2021-06-03 DIAGNOSIS — I10 Essential (primary) hypertension: Secondary | ICD-10-CM

## 2021-06-03 LAB — GLUCOSE, CAPILLARY
Glucose-Capillary: 115 mg/dL — ABNORMAL HIGH (ref 70–99)
Glucose-Capillary: 123 mg/dL — ABNORMAL HIGH (ref 70–99)
Glucose-Capillary: 103 mg/dL — ABNORMAL HIGH (ref 70–99)

## 2021-06-03 MED ORDER — PROSOURCE PLUS PO LIQD
30.0000 mL | Freq: Two times a day (BID) | ORAL | Status: DC
  Administered 2021-06-03 – 2021-06-30 (×52): 30 mL via ORAL
  Filled 2021-06-03 (×49): qty 30

## 2021-06-03 MED ORDER — MELATONIN 3 MG PO TABS
3.0000 mg | ORAL_TABLET | Freq: Every day | ORAL | Status: DC
  Administered 2021-06-03 – 2021-06-15 (×13): 3 mg via ORAL
  Filled 2021-06-03 (×13): qty 1

## 2021-06-03 NOTE — Progress Notes (Signed)
Orthopedic Tech Progress Note Patient Details:  Vickie Mckee Jun 29, 1931 [id number] Small resting hand splint for patient's left hand has been ordered from [facility].  Patient ID: Vickie Mckee, female   DOB: 1931-02-08, 87 y.o.   MRN: [id number]  Jearld Lesch 06/03/2021, 12:11 PM

## 2021-06-03 NOTE — Plan of Care (Signed)
Problem: RH Balance Goal: LTG Patient will maintain dynamic sitting balance (PT) Description: LTG:  Patient will maintain dynamic sitting balance with assistance during mobility activities (PT) Flowsheets (Taken 06/02/2021 1722) LTG: Pt will maintain dynamic sitting balance during mobility activities with:: Supervision/Verbal cueing Goal: LTG Patient will maintain dynamic standing balance (PT) Description: LTG:  Patient will maintain dynamic standing balance with assistance during mobility activities (PT) Flowsheets (Taken 06/02/2021 1722) LTG: Pt will maintain dynamic standing balance during mobility activities with:: Minimal Assistance - Patient > 75%   Problem: Sit to Stand Goal: LTG:  Patient will perform sit to stand with assistance level (PT) Description: LTG:  Patient will perform sit to stand with assistance level (PT) Flowsheets (Taken 06/02/2021 1722) LTG: PT will perform sit to stand in preparation for functional mobility with assistance level: Contact Guard/Touching assist   Problem: RH Bed Mobility Goal: LTG Patient will perform bed mobility with assist (PT) Description: LTG: Patient will perform bed mobility with assistance, with/without cues (PT). Flowsheets (Taken 06/02/2021 1722) LTG: Pt will perform bed mobility with assistance level of: Contact Guard/Touching assist   Problem: RH Bed to Chair Transfers Goal: LTG Patient will perform bed/chair transfers w/assist (PT) Description: LTG: Patient will perform bed to chair transfers with assistance (PT). Flowsheets (Taken 06/02/2021 1722) LTG: Pt will perform Bed to Chair Transfers with assistance level: Minimal Assistance - Patient > 75%   Problem: RH Car Transfers Goal: LTG Patient will perform car transfers with assist (PT) Description: LTG: Patient will perform car transfers with assistance (PT). Flowsheets (Taken 06/02/2021 1722) LTG: Pt will perform car transfers with assist:: Minimal Assistance - Patient > 75%    Problem: RH Furniture Transfers Goal: LTG Patient will perform furniture transfers w/assist (OT/PT) Description: LTG: Patient will perform furniture transfers  with assistance (OT/PT). Flowsheets (Taken 06/02/2021 1722) LTG: Pt will perform furniture transfers with assist:: Minimal Assistance - Patient > 75%   Problem: RH Ambulation Goal: LTG Patient will ambulate in controlled environment (PT) Description: LTG: Patient will ambulate in a controlled environment, # of feet with assistance (PT). Flowsheets (Taken 06/02/2021 1722) LTG: Pt will ambulate in controlled environ  assist needed:: Minimal Assistance - Patient > 75% LTG: Ambulation distance in controlled environment: at least 75 feet using LRAD   Problem: RH Wheelchair Mobility Goal: LTG Patient will propel w/c in controlled environment (PT) Description: LTG: Patient will propel wheelchair in controlled environment, # of feet with assist (PT) Flowsheets (Taken 06/02/2021 1722) LTG: Pt will propel w/c in controlled environ  assist needed:: Contact Guard/Touching assist LTG: Propel w/c distance in controlled environment: 50 feet Goal: LTG Patient will propel w/c in home environment (PT) Description: LTG: Patient will propel wheelchair in home environment, # of feet with assistance (PT). Flowsheets (Taken 06/02/2021 1722) LTG: Pt will propel w/c in home environ  assist needed:: Minimal Assistance - Patient > 75% Distance: wheelchair distance in controlled environment: 50 LTG: Propel w/c distance in home environment: 50 feet with ability to maneuver obstacles   Problem: RH Stairs Goal: LTG Patient will ambulate up and down stairs w/assist (PT) Description: LTG: Patient will ambulate up and down # of stairs with assistance (PT) Flowsheets (Taken 06/02/2021 1722) LTG: Pt will ambulate up/down stairs assist needed:: Minimal Assistance - Patient > 75% LTG: Pt will  ambulate up and down number of stairs: at least one step using HR as  provided or LRAD   Problem: RH Ambulation Goal: LTG Patient will ambulate in home environment (PT) Description: LTG: Patient  will ambulate in home environment, # of feet with assistance (PT). Flowsheets (Taken 06/02/2021 1722) LTG: Pt will ambulate in home environ  assist needed:: Minimal Assistance - Patient > 75% LTG: Ambulation distance in home environment: 50 ft using LRAD

## 2021-06-03 NOTE — Progress Notes (Signed)
Speech Language Pathology Daily Session Note  Patient Details  Name: Vickie Mckee MRN: [id number] Date of Birth: December 30, 1930  Today's Date: 06/03/2021 SLP Individual Time: 6184-8592 SLP Individual Time Calculation (min): 44 min  Short Term Goals: Week 1: SLP Short Term Goal 1 (Week 1): Patient will consistently demonstrate orientation x4 with min A verbal cues and use of compensatory aids SLP Short Term Goal 2 (Week 1): Patient will demonstrate basic problem solving for basic tasks with min A verbal cues SLP Short Term Goal 3 (Week 1): Patient will use breath support techniques to increase vocal intensity at sentence level to achieve 90% intelligibility with sup A verbal cues SLP Short Term Goal 4 (Week 1): Patient will tolerate current diet with minimal-to-no overt s/sx of aspiration with min A verbal cues for use of swallowing compensatory strategies SLP Short Term Goal 5 (Week 1): Patient will demonstrate effective mastication and oral clearance of Dys 2 trials with no overt s/sx of aspiration over at least 2 observed sessions to demonstrate readiness for diet advancement.  Skilled Therapeutic Interventions: Pt seen for skilled ST with focus on cognitive and swallow goals, daughters present. Pt repositioned in bed with HOB raised for PO trials. SLP facilitating ongoing assessment for tolerance of NTLs. Pt demonstrating 1 immediate large coughing/choking episode across 3 oz, likely a result of visual distraction. Distraction removed and pt with no further overt s/s aspiration. Pt did benefit from min verbal cues to utilize small sips. Pt and family educated on removing distractions during PO intake for pt to focus, recall and utilize swallow precautions as trained. Pt perseverating on walking up with daughters gone (had gone for lunch) and feeling confused/disoriented. Family encouraged to bring in pictures and other personal items to assist with orientation during times of confusion. Pt left in  bed with family present and all needs met. Cont ST POC.   Pain Pain Assessment Pain Scale: 0-10 Pain Score: 0-No pain Pain Descriptors / Indicators: Headache Pain Intervention(s): Medication (See eMAR)  Therapy/Group: Individual Therapy  Dewaine Conger 06/03/2021, 2:44 PM

## 2021-06-03 NOTE — Progress Notes (Signed)
Physical Therapy Session Note  Patient Details  Name: Vickie Mckee MRN: [id number] Date of Birth: [birth year]  Today's Date: 06/03/2021 PT Individual Time: 1010-1106 and [id number] PT Individual Time Calculation (min): 56 min and 30 min  Short Term Goals: Week 1:  PT Short Term Goal 1 (Week 1): Pt will perform STS with Max A +1 to LRAD. PT Short Term Goal 2 (Week 1): Pt will perform supine <> sit with Mod/ Max A. PT Short Term Goal 3 (Week 1): Pt will perform functional bed <> w/c transfers with Max A +1. PT Short Term Goal 4 (Week 1): Pt will tolerate standing for > 71mn. PT Short Term Goal 5 (Week 1): Pt will initiate gait training.  Skilled Therapeutic Interventions/Progress Updates:    Session 1: Pt received sitting in w/c from OT session and agreeable to this therapy session though reporting fatigue. Pt perseverates on not being able to move L side of body - educated on impact of stroke with poor recall during session. Transported to/from gym. Therapist reviewed available w/c supplies to determine if one with increased trunk support is in stock; however, none availble in pt's size at this time. OT provided pt with L 1/2 lap tray.   Seated scooting in w/c to prepare for transfer - improved ablity to scoot R hip forward using R UE support - requires max/total assist for L hip. R squat pivot w/c>EOM +2 max assist for lifting/pivoting hips - assist for L LE positioning though pt demos trace hip flexion.   Sitting EOM, focusing on trunk control/sitting balance with pt demoing R lateral trunk lean and R cervical rotation with downward gaze - cuing throughout for improvement with pt requiring CGA and intermittent min assist due to R posterior LOB. Participated in the PASS as follows with pt requiring max sequential cuing for sequencing through each item. Attempted to stand 2x with +2 total assist with R HHA and therapist blocking  L knee buckle and assisting with lifting and then facilitating  hip/knee extension  but pt unable to bring hips up off mat and trunk upright to achieve stand. Pt reports some L anterior hip pain upon attempts at standing therefore deferred additional trials. Pt requesting to return to bed and rest at end of session. L squat pivot EOM>w/c with +2 total assist for lifting/pivoting hips, maintaining trunk control (prevent R LOB), and manually facilitating L LE positioning - cuing for head/hips relationship. Transported back to room and performed squat pivot to EOB as just described. Sit>supine with max assist for trunk descent and L hemibody management onto bed. Pt left supine in bed with needs in reach, L UE supported on pillows, bed alarm on, and her daughters present.  Postural Assessment Scale for Stroke Patients (PASS)  Give the subject instructions for each item as written below. When scoring the item, record the lowest response category that applies for each item.  Maintaining a Posture  _0_ 1. Sitting Without Support Instructions: Have the subject sit on a bench/mat without back support and with feet flat on the floor. (3) Can sit for 5 minutes without support (2) Can sit for more than 10 seconds without support (1) Can sit with slight support (for example, by 1 hand) (0) Cannot sit  _0_ 2. Standing With Support Instructions: Have the subject stand, providing support as needed. Evaluate only the ability to stand with or without support. Do not consider the quality of the stance. (3) Can stand with support of only 1 hand (  2) Can stand with moderate support of 1 person (1) Can stand with strong support of 2 people (0) Cannot stand, even with support  _0_ 3. Standing Without Support Instructions: Have the subject stand without support. Evaluate only the ability to stand with or without support. Do not consider the quality of the stance. (3) Can stand without support for more than 1 minute and simultaneously perform arm movements at about shoulder  level (2) Can stand without support for 1 minute or stands slightly asymmetrically  (1) Can stand without support for 10 seconds or leans heavily on 1 leg (0) Cannot stand without support  _0_ 4. Standing on Nonparetic Leg Instructions: Have the subject stand on the nonparetic leg. Evaluate only the ability to bear weight entirely on the nonparetic leg. Do not consider how the subject accomplishes the task. (3) Can stand on nonparetic leg for more than 10 seconds (2) Can stand on nonparetic leg for more than 5 seconds (1) Can stand on nonparetic leg for a few seconds (0) Cannot stand on nonparetic leg  _0_ 5. Standing on Paretic Leg Instructions: Have the subject stand on the paretic leg. Evaluate only the ability to bear weight entirely on the paretic leg. Do not consider how the subject accomplishes the task. (3) Can stand on paretic leg for more than 10 seconds (2) Can stand on paretic leg for more than 5 seconds (1) Can stand on paretic leg for a few seconds (0) Cannot stand on paretic leg  Maintaining Posture SUBTOTAL __0__  Changing a Posture  _1_ 6. Supine to Paretic Side Lateral Instructions: Begin with the subject in supine on a treatment mat. Instruct the subject to roll to the paretic side (lateral movement). Assist as necessary. Evaluate the subject's performance on the amount of help required. Do not consider the quality of performance. (3) Can perform without help (2) Can perform with little help (1) Can perform with much help (0) Cannot perform  _1_ 7. Supine to Nonparetic Side Lateral Instructions: Begin with the subject in supine on a treatment mat. Instruct the subject to roll to the nonparetic side (lateral movement). Assist as necessary. Evaluate the subject's performance on the amount of help required. Do not consider the quality of performance. (3) Can perform without help (2) Can perform with little help (1) Can perform with much help (0) Cannot  perform  _1_ 8. Supine to Sitting Up on the Edge of the Mat Instructions: Begin with the subject in supine on a treatment mat. Instruct the subject to come to sitting on the edge of the mat. Assist as necessary. Evaluate the subject's performance on the amount of help required. Do not consider the quality of performance. (3) Can perform without help (2) Can perform with little help (1) Can perform with much help (0) Cannot perform  _1_ 9. Sitting on the Edge of the Mat to Supine Instructions: Begin with the on the edge of a treatment mat. Instruct the subject to return to supine. Assist as necessary. Evaluate the subject's performance on the amount of help required. Do not consider the quality of performance. (3) Can perform without help (2) Can perform with little help (1) Can perform with much help (0) Cannot perform  _0_ 10. Sitting to Standing Up Instructions: Begin with the subject sitting on the edge of a treatment mat. Instruct the subject to stand up without support. Assist if necessary. Evaluate the subject's performance on the amount of help required. Do not consider the quality  of performance. (3) Can perform without help (2) Can perform with little help (1) Can perform with much help (0) Cannot perform  _0_ 11. Standing Up to Sitting Down Instructions: Begin with the subject standing by edge of a treatment mat. Instruct the subject to sit on edge of mat without support. Assist if necessary. Evaluate the subject's performance on the amount of help required. Do not consider the quality of performance. (3) Can perform without help (2) Can perform with little help (1) Can perform with much help (0) Cannot perform  _0_ 12. Standing, Picking Up a Pencil from the Floor Instructions: Begin with the subject standing. Instruct the subject to pick up a pencil fro the floor without support. Assist if necessary. Evaluate the subject's performance on the amount of help required. Do not  consider the quality of performance. (3) Can perform without help (2) Can perform with little help (1) Can perform with much help (0) Cannot perform  Changing Posture SUBTOTAL _4_   TOTAL __4___  Session 2: Pt received supine in bed with her daughters present and pt awakening from a nap. Pt agreeable to therapy session with min encouragement needed due to fatigue. Supine>sitting R EOB, HOB flat but bedrail available, via logroll technique to increase pt independence total/dependent assist for L hemibody management and total assist for trunk upright with pt demonstrating R posterior lean - cuing and facilitation for improved midline. Sitting EOB with +2 guarding trunk while therapist donned B shoes total assist. Forward scoot on EOB with pt able to scoot R hip forward with mod assist for trunk control (preventing R LOB) and then total assist for L hip scoot with pt showing some initiation of this movement. L squat pivot EOB>w/c +2 total assist for lifting/pivoting hips and maintaining trunk control (prevent R LOB) cuing for head/hips relationship and increased B LE muscle activation - given increased time pt demos initiation of the transfer. Transported to/from gym in w/c for time management and energy conservation.  Sit>stand w/c>R HHA from +2 assist with mirror feedback x2; +2 total assist for lifting, balance, and blocking L knee buckle - on 2nd attempt with facilitation and visual cuing to extend trunk and "look up" at external target pt able to progress to improved upright trunk/hip extension (though still flexed at hips) and maintain for ~10 seconds. Pt reporting fatigue and requesting return to bed. R squat pivot w/c>EOB with +2 total assist for lifting/pivoting hips and maintaining trunk balance - cuing as described above. Sit>supine +2 max assist for trunk descent and L hemibody management as well as assist for R LE management onto bed. Pt left supine in bed with needs in reach, L UE supported on  pillow, bed alarm on, and her daughters present.  Therapy Documentation Precautions:  Precautions Precautions: Fall Precaution Comments: heavy L hemiparesis, R gaze preference Restrictions Weight Bearing Restrictions: No   Pain:  Session 1: Reports L anterior hip pain when attempting to come to stand, unrated, returned to sitting and limited repetitions to avoid increased pain. In general, reports L anterior shoulder pain - supported this UE during mobility tasks for pain management.  Session 2: No reports of pain just fatigue.   Therapy/Group: Individual Therapy  Tawana Scale , PT, DPT, NCS, CSRS 06/03/2021, 7:55 AM

## 2021-06-03 NOTE — Progress Notes (Signed)
Occupational Therapy Session Note  Patient Details  Name: Vickie Mckee MRN: [id number] Date of Birth: 1931-01-04  Today's Date: 06/03/2021 OT Individual Time: [id number] OT Individual Time Calculation (min): 59 min    Short Term Goals: Week 1:  OT Short Term Goal 1 (Week 1): Pt will maintain static sitting balance EOB with close supervision for at least 2 mins in preparation for selfcare tasks. OT Short Term Goal 2 (Week 1): Pt will complete UB bathing sitting supported in wheelchair with min assist for 2 consecutive sessions. OT Short Term Goal 3 (Week 1): Pt will complete UB dressing with mod assist for donning a pullover shirt following hemi dressing techniques. OT Short Term Goal 4 (Week 1): Pt's family will return demonstrate safe assist with AAROM/PROM exercises for the RUE. OT Short Term Goal 5 (Week 1): Pt will complete squat pivot transfer to the drop arm commode with max assist.  Skilled Therapeutic Interventions/Progress Updates:    Pt completed supine to sit EOB with max assist.  Worked on dressing tasks at EOB with max instructional cueing and mod assist for donning pullover shirt following hemi techniques.  Max assist with max instructional cueing to sustain upright posture with cervical and lumbar extension.  Head rotation to the right as well as cervical tilt to the right.  She was unable to maintain for extended periods of time and would fall back into flexion and posterior pelvic tilt.  Posterior LOB at times as well was noted.  Total +2 (pt 25%) for sit to stand with donning new brief and pants.  Increased trunk flexion forward and to the right in standing as well.  Total assist for TEDs and shoes with max assist squat pivot transfer to the wheelchair on the left side.  She worked on grooming tasks at the sink including oral hygiene and combing her hair from supported position.  Finished session with pt sitting in the wheelchair with call button in reach and rehab tech  supervising in preparation for next session with PT.    Therapy Documentation Precautions:  Precautions Precautions: Fall Precaution Comments: heavy L hemiparesis, R gaze preference Restrictions Weight Bearing Restrictions: No  Pain: Pain Assessment Pain Scale: 0-10 Pain Score: 0-No pain Faces Pain Scale: Hurts little more Pain Type: Neuropathic pain Pain Location: Leg Pain Orientation: Left Pain Descriptors / Indicators: Headache Pain Onset: With Activity Pain Intervention(s): Medication (See eMAR) ADL: See Care Tool Section for some details of mobility   Therapy/Group: Individual Therapy  Sherrel Shafer OTR/L 06/03/2021, 12:35 PM

## 2021-06-03 NOTE — Progress Notes (Addendum)
PROGRESS NOTE   Subjective/Complaints: Patient seen sitting up in bed this morning working with therapies.  She states she slept well overnight.  Discussed left upper extremity strength and bracing with therapies.  ROS: Denies CP, SOB, N/V/D  Objective:   DG Chest Port 1 View  Result Date: 06/02/2021 CLINICAL DATA:  Aspiration pneumonia EXAM: PORTABLE CHEST 1 VIEW COMPARISON:  01/24/2015 FINDINGS: The heart size and mediastinal contours are within normal limits. Atherosclerotic calcification of the aortic knob. Chronically coarsened interstitial markings bilaterally. No focal airspace consolidation, pleural effusion, or pneumothorax. Oral contrast is visualized within the colon. The visualized skeletal structures are unremarkable. IMPRESSION: No active disease. Electronically Signed   By: Davina Poke D.O.   On: 06/02/2021 13:03   Recent Labs    06/01/21 1513 06/02/21 0510  WBC 9.4 8.4  HGB 13.7 13.4  HCT 41.5 38.3  PLT 262 263    Recent Labs    06/01/21 0456 06/01/21 1513 06/02/21 0510  NA 138  --  137  K 3.1*  --  3.7  CL 108  --  107  CO2 26  --  23  GLUCOSE 121*  --  110*  BUN 16  --  15  CREATININE 0.77 0.74 0.74  CALCIUM 8.2*  --  8.6*     Intake/Output Summary (Last 24 hours) at 06/03/2021 1121 Last data filed at 06/03/2021 0805 Gross per 24 hour  Intake 510 ml  Output --  Net 510 ml         Physical Exam: Vital Signs Blood pressure (!) 146/77, pulse 73, temperature 97.6 F (36.4 C), temperature source Oral, resp. rate 18, height '5\' 2"'$  (1.575 m), weight 55.9 kg, SpO2 96 %. Constitutional: No distress . Vital signs reviewed. HENT: Normocephalic.  Atraumatic. Eyes: EOMI. No discharge. Cardiovascular: No JVD.  RRR. Respiratory: Normal effort.  No stridor.  Bilateral clear to auscultation. GI: Non-distended.  BS +. Skin: Warm and dry.  Intact. Psych: Flat.  Slowed. Musc: No edema in  extremities.  No tenderness in extremities. Neuro: Alert Motor: LUE: 0/5 proximal distal LLE: Hip flexion, knee extension 1/5, ankle dorsiflexion 0/5 Neuro: Alert and oriented x1 Dysarthria   Assessment/Plan: 1. Functional deficits which require 3+ hours per day of interdisciplinary therapy in a comprehensive inpatient rehab setting. Physiatrist is providing close team supervision and 24 hour management of active medical problems listed below. Physiatrist and rehab team continue to assess barriers to discharge/monitor patient progress toward functional and medical goals  Care Tool:  Bathing    Body parts bathed by patient: Chest, Abdomen, Right upper leg, Left upper leg   Body parts bathed by helper: Buttocks, Front perineal area, Right lower leg, Left lower leg, Face, Right arm, Left arm     Bathing assist Assist Level: 2 Helpers     Upper Body Dressing/Undressing Upper body dressing   What is the patient wearing?: Button up shirt    Upper body assist Assist Level: Total Assistance - Patient < 25%    Lower Body Dressing/Undressing Lower body dressing      What is the patient wearing?: Incontinence brief, Pants     Lower body assist Assist for  lower body dressing: 2 Banker assist Assist for toileting: 2 Helpers     Transfers Chair/bed transfer  Transfers assist     Chair/bed transfer assist level: 2 Helpers (STEDY)     Locomotion Ambulation   Ambulation assist   Ambulation activity did not occur: Safety/medical concerns          Walk 10 feet activity   Assist  Walk 10 feet activity did not occur: Safety/medical concerns        Walk 50 feet activity   Assist Walk 50 feet with 2 turns activity did not occur: Safety/medical concerns         Walk 150 feet activity   Assist Walk 150 feet activity did not occur: Safety/medical concerns         Walk 10 feet on uneven surface   activity   Assist Walk 10 feet on uneven surfaces activity did not occur: Safety/medical concerns         Wheelchair     Assist Will patient use wheelchair at discharge?: Yes Type of Wheelchair: Manual Wheelchair activity did not occur: Safety/medical concerns         Wheelchair 50 feet with 2 turns activity    Assist    Wheelchair 50 feet with 2 turns activity did not occur: Safety/medical concerns       Wheelchair 150 feet activity     Assist  Wheelchair 150 feet activity did not occur: Safety/medical concerns       Blood pressure (!) 146/77, pulse 73, temperature 97.6 F (36.4 C), temperature source Oral, resp. rate 18, height '5\' 2"'$  (1.575 m), weight 55.9 kg, SpO2 96 %.      Medical Problem List and Plan: 1.   Left-sided hemiparesis with dysarthria/dysphagia secondary to right pontine infarction as well as multiple small remote bilateral cerebellar lacunar infarcts Continue CIR  Cont PRAFO, WHO ordered 2.  Impaired mobility: Continue Lovenox for DVT prophylaxis             -antiplatelet therapy: Aspirin 81 mg daily and Plavix 75 mg day x1 month then Plavix alone 3. Pain Management/chronic back pain: Tylenol as needed 4. Depression: Continue Zoloft 100 mg daily, Aricept 5 mg nightly             -antipsychotic agents: N/A 5. Neuropsych: This patient is capable of making decisions on her own behalf. 6. Skin/Wound Care: Routine skin checks 7. Fluids/Electrolytes/Nutrition: Routine in and outs 8. Post stroke dysphagia.  Dysphagia #1 nectar thick liquids.  Follow-up speech therapy.  Monitor hydration   Advance diet as tolerated 9.  Hypertension.  Patient on verapamil 240 mg daily prior to admission as well as HCTZ 25 mg daily.    Relatively controlled on 8/13 10.  Hyperlipidemia.  Lipitor 11.  Hypothyroidism.  TSH 2.093 .Continue Synthroid 12.  Constipation.  MiraLAX scheduled daily.  Senokot S2 tablets nightly  13. Hypokalemia: Supplemented on  8/12 Labs ordered for Monday High potassium foods previously discussed with daughters.  14. Crackles and coughing with food intake: placed nursing order for must be up with meals,   Chest x-ray unremarkable for acute process 15.  Hypoalbuminemia  Supplement initiated on 8/13  LOS: 2 days A FACE TO FACE EVALUATION WAS PERFORMED  Robert Sperl [name] 06/03/2021, 11:21 AM

## 2021-06-04 DIAGNOSIS — I639 Cerebral infarction, unspecified: Secondary | ICD-10-CM

## 2021-06-04 LAB — GLUCOSE, CAPILLARY
Glucose-Capillary: 106 mg/dL — ABNORMAL HIGH (ref 70–99)
Glucose-Capillary: 140 mg/dL — ABNORMAL HIGH (ref 70–99)

## 2021-06-04 NOTE — Progress Notes (Signed)
PROGRESS NOTE   Subjective/Complaints: Patient seen sitting up in bed this morning.  She states she slept well overnight.  She denies complaints.  ROS: Denies CP, SOB, N/V/D  Objective:   DG Chest Port 1 View  Result Date: 06/02/2021 CLINICAL DATA:  Aspiration pneumonia EXAM: PORTABLE CHEST 1 VIEW COMPARISON:  01/24/2015 FINDINGS: The heart size and mediastinal contours are within normal limits. Atherosclerotic calcification of the aortic knob. Chronically coarsened interstitial markings bilaterally. No focal airspace consolidation, pleural effusion, or pneumothorax. Oral contrast is visualized within the colon. The visualized skeletal structures are unremarkable. IMPRESSION: No active disease. Electronically Signed   By: Davina Poke D.O.   On: 06/02/2021 13:03   Recent Labs    06/01/21 1513 06/02/21 0510  WBC 9.4 8.4  HGB 13.7 13.4  HCT 41.5 38.3  PLT 262 263    Recent Labs    06/01/21 1513 06/02/21 0510  NA  --  137  K  --  3.7  CL  --  107  CO2  --  23  GLUCOSE  --  110*  BUN  --  15  CREATININE 0.74 0.74  CALCIUM  --  8.6*     Intake/Output Summary (Last 24 hours) at 06/04/2021 [id number] Last data filed at 06/03/2021 1755 Gross per 24 hour  Intake 240 ml  Output --  Net 240 ml         Physical Exam: Vital Signs Blood pressure (!) 152/75, pulse 68, temperature 97.7 F (36.5 C), temperature source Oral, resp. rate 18, height '5\' 2"'$  (1.575 m), weight 53.5 kg, SpO2 97 %. Constitutional: No distress . Vital signs reviewed. HENT: Normocephalic.  Atraumatic. Eyes: EOMI. No discharge. Cardiovascular: No JVD.  RRR. Respiratory: Normal effort.  No stridor.  Bilateral clear to auscultation. GI: Non-distended.  BS +. Skin: Warm and dry.  Intact. Psych: Flat.  Slowed. Musc: No edema in extremities.  No tenderness in extremities. Neuro: Alert Motor: LUE: 0/5 proximal distal, unchanged LLE: Hip flexion, knee  extension 1/5, ankle dorsiflexion 0/5 Dysarthria  Assessment/Plan: 1. Functional deficits which require 3+ hours per day of interdisciplinary therapy in a comprehensive inpatient rehab setting. Physiatrist is providing close team supervision and 24 hour management of active medical problems listed below. Physiatrist and rehab team continue to assess barriers to discharge/monitor patient progress toward functional and medical goals  Care Tool:  Bathing    Body parts bathed by patient: Chest, Abdomen, Right upper leg, Left upper leg   Body parts bathed by helper: Buttocks, Front perineal area, Right lower leg, Left lower leg, Face, Right arm, Left arm     Bathing assist Assist Level: 2 Helpers     Upper Body Dressing/Undressing Upper body dressing   What is the patient wearing?: Pull over shirt    Upper body assist Assist Level: Maximal Assistance - Patient 25 - 49%    Lower Body Dressing/Undressing Lower body dressing      What is the patient wearing?: Incontinence brief, Pants     Lower body assist Assist for lower body dressing: 2 Helpers     Toileting Toileting    Toileting assist Assist for toileting: 2 Helpers  Transfers Chair/bed transfer  Transfers assist     Chair/bed transfer assist level: Maximal Assistance - Patient 25 - 49% (squat pivot)     Locomotion Ambulation   Ambulation assist   Ambulation activity did not occur: Safety/medical concerns          Walk 10 feet activity   Assist  Walk 10 feet activity did not occur: Safety/medical concerns        Walk 50 feet activity   Assist Walk 50 feet with 2 turns activity did not occur: Safety/medical concerns         Walk 150 feet activity   Assist Walk 150 feet activity did not occur: Safety/medical concerns         Walk 10 feet on uneven surface  activity   Assist Walk 10 feet on uneven surfaces activity did not occur: Safety/medical concerns          Wheelchair     Assist Will patient use wheelchair at discharge?: Yes Type of Wheelchair: Manual Wheelchair activity did not occur: Safety/medical concerns         Wheelchair 50 feet with 2 turns activity    Assist    Wheelchair 50 feet with 2 turns activity did not occur: Safety/medical concerns       Wheelchair 150 feet activity     Assist  Wheelchair 150 feet activity did not occur: Safety/medical concerns       Blood pressure (!) 152/75, pulse 68, temperature 97.7 F (36.5 C), temperature source Oral, resp. rate 18, height '5\' 2"'$  (1.575 m), weight 53.5 kg, SpO2 97 %.      Medical Problem List and Plan: 1.   Left-sided hemiparesis with dysarthria/dysphagia secondary to right pontine infarction as well as multiple small remote bilateral cerebellar lacunar infarcts Continue CIR  Cont PRAFO, WHO nightly 2.  Impaired mobility: Continue Lovenox for DVT prophylaxis             -antiplatelet therapy: Aspirin 81 mg daily and Plavix 75 mg day x1 month then Plavix alone 3. Pain Management/chronic back pain: Tylenol as needed 4. Depression: Continue Zoloft 100 mg daily, Aricept 5 mg nightly             -antipsychotic agents: N/A 5. Neuropsych: This patient is capable of making decisions on her own behalf. 6. Skin/Wound Care: Routine skin checks 7. Fluids/Electrolytes/Nutrition: Routine in and outs 8. Post stroke dysphagia.  Dysphagia #1 nectar thick liquids.  Follow-up speech therapy.  Monitor hydration   Advance diet as tolerated 9.  Hypertension.  Patient on verapamil 240 mg daily prior to admission as well as HCTZ 25 mg daily.    Relatively controlled on 8/14 10.  Hyperlipidemia.  Lipitor 11.  Hypothyroidism.  TSH 2.093 .Continue Synthroid 12.  Constipation.  MiraLAX scheduled daily.  Senokot S2 tablets nightly  13. Hypokalemia: Supplemented on 8/12 Labs ordered for tomorrow High potassium foods previously discussed with daughters.  14. Crackles and coughing  with food intake: placed nursing order for must be up with meals,   Chest x-ray unremarkable for acute process 15.  Hypoalbuminemia  Supplement initiated on 8/13  LOS: 3 days A FACE TO FACE EVALUATION WAS PERFORMED  Jolanta Cabeza [name] 06/04/2021, 8:22 AM

## 2021-06-04 NOTE — IPOC Note (Addendum)
Individualized overall Plan of Care [facility]) Patient Details Name: Vickie Mckee MRN: [id number] DOB: December 04, 1930  Admitting Diagnosis: Left hemiplegia [facility])  Hospital Problems: Principal Problem:   Left hemiplegia ([place]) Active Problems:   Right pontine cerebrovascular accident ([place])   Hypoalbuminemia due to protein-calorie malnutrition ([place])   Hypokalemia   Dysphagia, post-stroke     Functional Problem List: Nursing Bladder, Endurance, Medication Management, Bowel, Safety  PT Balance, Edema, Endurance, Motor, Perception, Safety, Sensory  OT Balance, Perception, Safety, Cognition, Sensory, Endurance, Motor, Pain, Vision  SLP Cognition, Motor, Nutrition, Linguistic  TR         Basic ADL's: OT Eating, Grooming, Bathing, Dressing, Toileting     Advanced  ADL's: OT       Transfers: PT Bed Mobility, Bed to Chair, Car, Manufacturing systems engineer, Metallurgist: PT Ambulation, Emergency planning/management officer, Stairs     Additional Impairments: OT Fuctional Use of Upper Extremity  SLP Swallowing, Communication, Social Cognition expression Memory, Awareness, Problem Solving, Attention  TR      Anticipated Outcomes Item Anticipated Outcome  Self Feeding supervision  Swallowing  sup A   Basic self-care  Min to mod assist  Toileting  Min to mod assist   Bathroom Transfers Min assist  Bowel/Bladder  min assist  Transfers  Min A using LRAD  Locomotion  Min/ Mod A using LRAD  Communication  sup A  Cognition  sup A  Pain  n/a  Safety/Judgment  min assist and no falls   Therapy Plan: PT Intensity: Minimum of 1-2 x/day ,45 to 90 minutes PT Frequency: 5 out of 7 days PT Duration Estimated Length of Stay: 24-28 days OT Intensity: Minimum of 1-2 x/day, 45 to 90 minutes OT Frequency: 5 out of 7 days OT Duration/Estimated Length of Stay: 24-28 days SLP Intensity: Minumum of 1-2 x/day, 30 to 90 minutes SLP Frequency: 3 to 5 out of 7 days SLP Duration/Estimated Length  of Stay: 24-28 days    Team Interventions: Nursing Interventions Patient/Family Education, Bladder Management, Bowel Management, Disease Management/Prevention, Medication Management, Discharge Planning  PT interventions Ambulation/gait training, Balance/vestibular training, Cognitive remediation/compensation, Community reintegration, Discharge planning, DME/adaptive equipment instruction, Functional mobility training, Disease management/prevention, Neuromuscular re-education, Pain management, Patient/family education, Psychosocial support, Splinting/orthotics, Stair training, Therapeutic Activities, Therapeutic Exercise, UE/LE Strength taining/ROM, UE/LE Coordination activities, Visual/perceptual remediation/compensation, Wheelchair propulsion/positioning  OT Interventions Training and development officer, Cognitive remediation/compensation, Discharge planning, DME/adaptive equipment instruction, Disease mangement/prevention, Functional electrical stimulation, Functional mobility training, Neuromuscular re-education, Patient/family education, Pain management, Self Care/advanced ADL retraining, Splinting/orthotics, UE/LE Strength taining/ROM, Therapeutic Exercise, Therapeutic Activities, UE/LE Coordination activities, Visual/perceptual remediation/compensation  SLP Interventions Cognitive remediation/compensation, Environmental controls, Internal/external aids, Speech/Language facilitation, Cueing hierarchy, Dysphagia/aspiration precaution training, Functional tasks, Patient/family education, Medication managment, Therapeutic Activities  TR Interventions    SW/CM Interventions Discharge Planning, Psychosocial Support, Patient/Family Education, Disease Management/Prevention   Barriers to Discharge MD  Medical stability, Wound care, and Weight  Nursing Decreased caregiver support, Home environment access/layout, Incontinence, Lack of/limited family support, Medication compliance Patient's home has ramped  entrance. Patient's daughters will rotate taking care of her. Will be able to provide min assist 24/7. She will be able to stay on main level with bedroom/bathroom.  PT Decreased caregiver support, Home environment access/layout, Lack of/limited family support, Medication compliance    OT Decreased caregiver support Pt lived alone with occasional assist from her daugthers.  Will need 24 hr min to mod assist at discharge.  SLP      SW  Team Discharge Planning: Destination: PT-Home ,OT- Home , SLP-Home Projected Follow-up: PT-24 hour supervision/assistance, Home health PT, OT-  24 hour supervision/assistance, SLP-Home Health SLP, 24 hour supervision/assistance Projected Equipment Needs: PT-To be determined, OT- To be determined, SLP-None recommended by SLP Equipment Details: PT- , OT-  Patient/family involved in discharge planning: PT- Patient,  OT-Patient, Family member/caregiver, SLP-Patient, Family member/caregiver  MD ELOS: 21-25 days. Medical Rehab Prognosis:  Good Assessment:  87 year old right-handed female with history of hypertension hypothyroidism GERD chronic low back pain, hypertrophic obstructive cardiomyopathy, some memory loss maintained on Aricept.  Two-level home bed and bath main level with a ramped entrance.  She has family and friends to assist her as needed.  Presented to [facility]/29/2021 with acute onset of left-sided weakness and dysarthria as well as left lateral gaze and 1 episode of vomiting.  Cranial CT scan negative for acute changes.  CT angiogram of head and neck showed no large vessel occlusion or high-grade stenosis.  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  Patient did not receive tPA.  MRI showed acute right pontine infarction.  Multiple small remote bilateral cerebellar lacunar infarcts.  Echocardiogram with ejection fraction of  60 to 65% no wall motion abnormalities.  EEG negative for seizure.  Admission chemistries alcohol negative, potassium 2.6, glucose 137, urine drug screen negative, hemoglobin A1c 5.7.  Currently maintained on aspirin 81 mg daily and Plavix 75 mg daily for CVA prophylaxis x1 month then antiplatelet monotherapy. Dysphagia #1 nectar thick liquid diet.  Therapy evaluations completed due to patient's left-sided weakness dysarthria/dysphagia was admitted for a comprehensive rehab program.  Will set goals for Supervision white PT/OT/SLP.    See Team Conference Notes for weekly updates to the plan of care

## 2021-06-04 NOTE — Progress Notes (Addendum)
Pt having spasms in leg, pt crying. Pt c/o pain. Tylenol given, legs supported with pillows. Pt comfortable after intervention. Call light in reach, needs met. Sheela Stack, LPN

## 2021-06-05 LAB — BASIC METABOLIC PANEL
Anion gap: 7 (ref 5–15)
BUN: 32 mg/dL — ABNORMAL HIGH (ref 8–23)
CO2: 29 mmol/L (ref 22–32)
Calcium: 9.4 mg/dL (ref 8.9–10.3)
Chloride: 105 mmol/L (ref 98–111)
Creatinine, Ser: 0.97 mg/dL (ref 0.44–1.00)
GFR, Estimated: 57 mL/min — ABNORMAL LOW (ref >60)
Glucose, Bld: 119 mg/dL — ABNORMAL HIGH (ref 70–99)
Potassium: 3.4 mmol/L — ABNORMAL LOW (ref 3.5–5.1)
Sodium: 141 mmol/L (ref 135–145)

## 2021-06-05 MED ORDER — METHOCARBAMOL 500 MG PO TABS
500.0000 mg | ORAL_TABLET | Freq: Once | ORAL | Status: AC
  Administered 2021-06-05: 500 mg via ORAL
  Filled 2021-06-05: qty 1

## 2021-06-05 NOTE — Progress Notes (Signed)
Occupational Therapy Session Note  Patient Details  Name: Vickie Mckee MRN: [id number] Date of Birth: 13-Jun-1931  Today's Date: 06/05/2021 OT Individual Time: 1300-1405 OT Individual Time Calculation (min): 65 min    Short Term Goals: Week 1:  OT Short Term Goal 1 (Week 1): Pt will maintain static sitting balance EOB with close supervision for at least 2 mins in preparation for selfcare tasks. OT Short Term Goal 2 (Week 1): Pt will complete UB bathing sitting supported in wheelchair with min assist for 2 consecutive sessions. OT Short Term Goal 3 (Week 1): Pt will complete UB dressing with mod assist for donning a pullover shirt following hemi dressing techniques. OT Short Term Goal 4 (Week 1): Pt's family will return demonstrate safe assist with AAROM/PROM exercises for the RUE. OT Short Term Goal 5 (Week 1): Pt will complete squat pivot transfer to the drop arm commode with max assist.  Skilled Therapeutic Interventions/Progress Updates:    Patient in bed, alert and feeding herself lunch - daughters present at start and close of session.  Scooping and food to mouth with CS, occ min A to manage thick liquids.  Supine to sitting edge of bed with max A of one, min A to maintain unsupported sitting.  Sit pivot transfer bed to drop arm commode with max A of one and stand by of 2nd.  Dependent for pants down.  Brief dry, unable to void. Sit to stand with max A of one - she is able to hold right railing and maintain with mod A for dependent hygiene and pants up.  Chair swap - sat to w/c with mod A.  Completed left arm AAROM, able to facilitate scapula in all directions, shoulder horiz ad/abd, flex/ext and elbow flex/ext with gravity eliminated and elbow support.  She remained seated in w/c at close of session.   Seat belt alarm on - nurse tech to find alarm - daughters present.  Call bell in hand.    Therapy Documentation Precautions:  Precautions Precautions: Fall Precaution Comments: heavy L  hemiparesis, R gaze preference Restrictions Weight Bearing Restrictions: No  Therapy/Group: Individual Therapy  Carlos Levering 06/05/2021, 7:44 AM

## 2021-06-05 NOTE — Progress Notes (Signed)
Physical Therapy Session Note  Patient Details  Name: Vickie Mckee MRN: [id number] Date of Birth: 11-Sep-1931  Today's Date: 06/05/2021 PT Individual Time: [id number] PT Individual Time Calculation (min): 47 min   Short Term Goals: Week 1:  PT Short Term Goal 1 (Week 1): Pt will perform STS with Max A +1 to LRAD. PT Short Term Goal 2 (Week 1): Pt will perform supine <> sit with Mod/ Max A. PT Short Term Goal 3 (Week 1): Pt will perform functional bed <> w/c transfers with Max A +1. PT Short Term Goal 4 (Week 1): Pt will tolerate standing for > 105mn. PT Short Term Goal 5 (Week 1): Pt will initiate gait training.  Skilled Therapeutic Interventions/Progress Updates:    Patient in supine with NT and daughters in the room reporting pt just back to bed due to fatigue up in chair and needing brief change.  Patient in supine for LE therex including bridging x 10 w/ 5 sec hold, hooklying marching with A on L, hooklying hip adductor squeezes with towel between knees 5 sec hold x 10 with max cues for technique each time.  Hooklying hip abduction with manual resistance and 5 sec hold x 10 reps and lateral trunk rotation with A x 10 reps.  Patient rolled to R with mod to max A and max cues and performed side to sit with total A.  PAtient seated EOB for balance able to sit with S to CGA after cues and several moments for upright activation, initially needing max to mod A.  Patient requesting drink and allowed to drink apple juice on tray honey thick, then Ensure on table just made per RN using spoon with slow pace and min A.  Patient sit to stand x 2 to chair back with max A for L LE stability and cues for shoulders back and head up, standing about 10 seconds.  [facility]  Sit to supine min to mod A for L LE.  Patient rolling several times with min A to L and mod A to max A to R.  Adjusted clothing and pad for comfort and scooted pt up in bed with +3 A.  PAtient  left in supine with HOB elevated, L arm on support and call bell in reach, daughers in the room.    Therapy Documentation Precautions:  Precautions Precautions: Fall Precaution Comments: heavy L hemiparesis, R gaze preference Restrictions Weight Bearing Restrictions: No Pain: Pain Assessment Pain Scale: 0-10 Pain Score: 5  Pain Location: Leg Pain Orientation: Left Pain Descriptors / Indicators: Aching Pain Intervention(s): Medication (See eMAR)   Therapy/Group: Individual Therapy  CReginia NaasCMagda Kiel PT 06/05/2021, 8:36 AM

## 2021-06-05 NOTE — Progress Notes (Signed)
PROGRESS NOTE   Subjective/Complaints:  Painful spasms LLE noted by 2 daughters at bedside , pt states discomfort is episodic mainly behind the Left thigh ROS: Denies CP, SOB, N/V/D  Objective:   No results found. No results for input(s): WBC, HGB, HCT, PLT in the last 72 hours.  No results for input(s): NA, K, CL, CO2, GLUCOSE, BUN, CREATININE, CALCIUM in the last 72 hours.   Intake/Output Summary (Last 24 hours) at 06/05/2021 0820 Last data filed at 06/04/2021 1805 Gross per 24 hour  Intake 480 ml  Output --  Net 480 ml         Physical Exam: Vital Signs Blood pressure 134/69, pulse 68, temperature (!) 97.5 F (36.4 C), temperature source Oral, resp. rate 17, height '5\' 2"'$  (1.575 m), weight 53.5 kg, SpO2 92 %.  General: No acute distress Mood and affect are appropriate Heart: Regular rate and rhythm no rubs murmurs or extra sounds Lungs: Clear to auscultation, breathing unlabored, no rales or wheezes Abdomen: Positive bowel sounds, soft nontender to palpation, nondistended Extremities: No clubbing, cyanosis, or edema Skin: No evidence of breakdown, no evidence of rash  Musc: No edema in extremities.  No tenderness in extremities. Neuro: Alert Motor: LUE: 0/5 proximal distal, unchanged LLE: Hip flexion, knee extension 2-/5, ankle dorsiflexion 0/5 Triple flexor response LLE   Dysarthria  Assessment/Plan: 1. Functional deficits which require 3+ hours per day of interdisciplinary therapy in a comprehensive inpatient rehab setting. Physiatrist is providing close team supervision and 24 hour management of active medical problems listed below. Physiatrist and rehab team continue to assess barriers to discharge/monitor patient progress toward functional and medical goals  Care Tool:  Bathing    Body parts bathed by patient: Chest, Abdomen, Right upper leg, Left upper leg   Body parts bathed by helper: Buttocks,  Front perineal area, Right lower leg, Left lower leg, Face, Right arm, Left arm     Bathing assist Assist Level: 2 Helpers     Upper Body Dressing/Undressing Upper body dressing   What is the patient wearing?: Pull over shirt    Upper body assist Assist Level: Maximal Assistance - Patient 25 - 49%    Lower Body Dressing/Undressing Lower body dressing      What is the patient wearing?: Incontinence brief, Pants     Lower body assist Assist for lower body dressing: 2 Helpers     Toileting Toileting    Toileting assist Assist for toileting: 2 Helpers     Transfers Chair/bed transfer  Transfers assist     Chair/bed transfer assist level: 2 Helpers (squat piovt)     Locomotion Ambulation   Ambulation assist   Ambulation activity did not occur: Safety/medical concerns          Walk 10 feet activity   Assist  Walk 10 feet activity did not occur: Safety/medical concerns        Walk 50 feet activity   Assist Walk 50 feet with 2 turns activity did not occur: Safety/medical concerns         Walk 150 feet activity   Assist Walk 150 feet activity did not occur: Safety/medical concerns  Walk 10 feet on uneven surface  activity   Assist Walk 10 feet on uneven surfaces activity did not occur: Safety/medical concerns         Wheelchair     Assist Will patient use wheelchair at discharge?: Yes Type of Wheelchair: Manual Wheelchair activity did not occur: Safety/medical concerns         Wheelchair 50 feet with 2 turns activity    Assist    Wheelchair 50 feet with 2 turns activity did not occur: Safety/medical concerns       Wheelchair 150 feet activity     Assist  Wheelchair 150 feet activity did not occur: Safety/medical concerns       Blood pressure 134/69, pulse 68, temperature (!) 97.5 F (36.4 C), temperature source Oral, resp. rate 17, height '5\' 2"'$  (1.575 m), weight 53.5 kg, SpO2 92 %.       Medical Problem List and Plan: 1.   Left-sided hemiparesis with dysarthria/dysphagia secondary to right pontine infarction as well as multiple small remote bilateral cerebellar lacunar infarcts Continue CIR  Cont PRAFO, WHO nightly 2.  Impaired mobility: Continue Lovenox for DVT prophylaxis             -antiplatelet therapy: Aspirin 81 mg daily and Plavix 75 mg day x1 month then Plavix alone Flexor withdrawal spasms LLE-zanaflex at night may need Knee immobilizers  3. Pain Management/chronic back pain: Tylenol as needed 4. Depression: Continue Zoloft 100 mg daily, Aricept 5 mg nightly             -antipsychotic agents: N/A 5. Neuropsych: This patient is capable of making decisions on her own behalf. 6. Skin/Wound Care: Routine skin checks 7. Fluids/Electrolytes/Nutrition: Routine in and outs 8. Post stroke dysphagia.  Dysphagia #1 nectar thick liquids.  Follow-up speech therapy.  Monitor hydration   Advance diet as tolerated 9.  Hypertension.  Patient on verapamil 240 mg daily prior to admission as well as HCTZ 25 mg daily.     Vitals:   06/04/21 1945 06/05/21 0351  BP: (!) 147/62 134/69  Pulse: 77 68  Resp: 16 17  Temp: 97.8 F (36.6 C) (!) 97.5 F (36.4 C)  SpO2: 94% 92%    10.  Hyperlipidemia.  Lipitor 11.  Hypothyroidism.  TSH 2.093 .Continue Synthroid 12.  Constipation.  MiraLAX scheduled daily.  Senokot S2 tablets nightly  13. Hypokalemia: Supplemented on 8/12 Labs ordered for tomorrow High potassium foods previously discussed with daughters.  14. Crackles and coughing with food intake: placed nursing order for must be up with meals, resolved   Chest x-ray unremarkable for acute process 15.  Hypoalbuminemia  Supplement initiated on 8/13  LOS: 4 days A FACE TO FACE EVALUATION WAS PERFORMED  Charlett Blake 06/05/2021, 8:20 AM

## 2021-06-05 NOTE — Progress Notes (Signed)
Physical Therapy Session Note  Patient Details  Name: Vickie Mckee MRN: [id number] Date of Birth: 1931/09/04  Today's Date: 06/05/2021 PT Individual Time: 0930-1100 PT Individual Time Calculation (min): 90 min   Short Term Goals: Week 1:  PT Short Term Goal 1 (Week 1): Pt will perform STS with Max A +1 to LRAD. PT Short Term Goal 2 (Week 1): Pt will perform supine <> sit with Mod/ Max A. PT Short Term Goal 3 (Week 1): Pt will perform functional bed <> w/c transfers with Max A +1. PT Short Term Goal 4 (Week 1): Pt will tolerate standing for > 42mn. PT Short Term Goal 5 (Week 1): Pt will initiate gait training.  Skilled Therapeutic Interventions/Progress Updates:     Pt supine in bed to start. 2 daughter's at bedside who were supportive and offering assistance when appropriate. Pt denies pain to start but later reports L shoulder pain - unrated. Rest breaks and repositioning provided for pain management. She appears to have +sulcus sign at shoulder joint.   Pt requesting to use bathroom prior to leaving room. Supine<>sit completed with totalA via log rolling technique with use of bed rails. Requires maxA for sitting EOB due to posterior lean - progresses to modA with cues and time acclimation. Completed squat<>pivot transfer with maxA towards her R side with +2 assist for stabilizing BSC. Pt completed [facility]<>[facility]unable to achieve full upright) [facility] and +2 assist needed for managing brief. Pt provided time to toilet but unable to void.   NT arriving and provided Stedy to assist for safety. Sit<>stand in SEvawith maxA +2 and able to sit in perched position with minA. Stedy transfer back to bed and completed sit>supine with maxA +2. Donned brief, TED's, shoes, pants at bed level, dependent. Supine<>sit with maxA and donned sweater with totalA via hemi technique. Completed squat<>pivot transfer with maxA to w/c with cues for forward weight shift and sequencing. Pt wheeled for time  to main rehab gym.   Squat<>pivot transfer with +2 maxA to mat table from w/c, cues for hand placement, forward lean, and sequencing. Requires L knee block to prevent buckling. At edge of mat, worked on static sitting and dynamic sitting balance with mirror for visual support. Sitting posture is very flexed at the trunk and posterior sacral sitting. She has forward head carriage and complains of difficulty keeping her head up in neutral position.  Worked on cone stacking L<>R to promote thoracic rotation and L attention. She also completed cone stacking to lowered 7inch platform to promote forward lean, anterior pelvic tilt, and core strengthening. She required min/modA overall for these activities. She frequently complained of fatigue and requesting to get back to bed. BP assessed to rule out hypotension - BP reading 133/65. Rest breaks provided as needed.  Initiated sit<>stand transfers with +2 maxA but pt unable to achieve full upright - lacks thoracic extension, hip extension, and quad strength to power to rise.   Returned to w/c via squat<>pivot transfer with +2 maxA and wheeled back to room for time. Assisted back to bed via squat<>pivot with maxA +2 and returned supine with +2 maxA. Boosted up in bed with totalA and pt remained supine in bed with needs in reach and pillows supporting hemi side. Pt made comfortable. Bed alarm on.  Therapy Documentation Precautions:  Precautions Precautions: Fall Precaution Comments: heavy L hemiparesis, R gaze preference Restrictions Weight Bearing Restrictions: No General:    Therapy/Group: Individual Therapy  Zykeriah Mathia P Tameca Jerez  06/05/2021, 7:43 AM

## 2021-06-05 NOTE — Progress Notes (Signed)
Speech Language Pathology Daily Session Note  Patient Details  Name: Vickie Mckee MRN: [id number] Date of Birth: 14-Feb-1931  Today's Date: 06/05/2021 SLP Individual Time: 1115-1200 SLP Individual Time Calculation (min): 45 min  Short Term Goals: Week 1: SLP Short Term Goal 1 (Week 1): Patient will consistently demonstrate orientation x4 with min A verbal cues and use of compensatory aids SLP Short Term Goal 2 (Week 1): Patient will demonstrate basic problem solving for basic tasks with min A verbal cues SLP Short Term Goal 3 (Week 1): Patient will use breath support techniques to increase vocal intensity at sentence level to achieve 90% intelligibility with sup A verbal cues SLP Short Term Goal 4 (Week 1): Patient will tolerate current diet with minimal-to-no overt s/sx of aspiration with min A verbal cues for use of swallowing compensatory strategies SLP Short Term Goal 5 (Week 1): Patient will demonstrate effective mastication and oral clearance of Dys 2 trials with no overt s/sx of aspiration over at least 2 observed sessions to demonstrate readiness for diet advancement.  Skilled Therapeutic Interventions: Patient agreeable to skilled ST intervention with focus on swallowing and cognitive goals. Patient was oriented to person, place, and situation only. She reported the year as "1962", the month as "January" and the day of week as "Friday." Patient with limited immediate recall of this information as she was known to repetitively ask the day of the week throughout session with minimal recollection of asking this further evidenced by being unsuccessful even given field of 2 choices. SLP facilitated positioning in bed for consumption of PO trials to assess liquid tolerance. Patient consumed ~7-8oz of nectar thick liquids (NTL) in which she exhibited overt s/sx of aspiration during approximately 50% of occasions as evidenced by immediate cough, delayed cough, throat clearing, and wet vocal quality.  Assessed tolerance with honey thick liquids in which patient tolerated with timely swallow initiation and exhibited no overt s/sx of aspiration. Based on overt s/sx of aspiration with nectar thick liquids (and no overt s/sx with HTL), fluctuating alertness, and cognitive deficits impacting ability to consistently execute safe swallow techniques, recommend downgrade to honey thick liquids at this time. Patient was educated and in agreement. Informed patient's nurse, nurse tech, updated diet order, and updated signs in room to reflect this change. Patient was left in bed with alarm activated and immediate needs within reach at end of session. Continue per current plan of care.      Pain - patient denied pain   Therapy/Group: Individual Therapy  Patty Sermons 06/05/2021, 5:05 PM

## 2021-06-06 MED ORDER — POTASSIUM CHLORIDE CRYS ER 10 MEQ PO TBCR
10.0000 meq | EXTENDED_RELEASE_TABLET | Freq: Every day | ORAL | Status: DC
  Administered 2021-06-06 – 2021-06-07 (×2): 10 meq via ORAL
  Filled 2021-06-06 (×2): qty 1

## 2021-06-06 MED ORDER — TIZANIDINE HCL 2 MG PO TABS
2.0000 mg | ORAL_TABLET | Freq: Every day | ORAL | Status: DC
  Administered 2021-06-06: 2 mg via ORAL
  Filled 2021-06-06: qty 1

## 2021-06-06 NOTE — Plan of Care (Signed)
  Problem: RH Problem Solving Goal: LTG Patient will demonstrate problem solving for (SLP) Description: LTG:  Patient will demonstrate problem solving for basic/complex daily situations with cues  (SLP) Flowsheets (Taken 06/06/2021 1704) LTG Patient will demonstrate problem solving for: Minimal Assistance - Patient > 75% Note: Goal modified due to lethargy and severity of deficits   Problem: RH Memory Goal: LTG Patient will use memory compensatory aids to (SLP) Description: LTG:  Patient will use memory compensatory aids to recall biographical/new, daily complex information with cues (SLP) Flowsheets (Taken 06/06/2021 1704) LTG: Patient will use memory compensatory aids to (SLP): Minimal Assistance - Patient > 75% Note: Goal modified due to lethargy and severity of deficits

## 2021-06-06 NOTE — Progress Notes (Signed)
Physical Therapy Session Note  Patient Details  Name: Vickie Mckee MRN: [id number] Date of Birth: [birth year]  Today's Date: 06/06/2021 PT Individual Time: [id number] PT Individual Time Calculation (min): 27 min and 55 min  Short Term Goals: Week 1:  PT Short Term Goal 1 (Week 1): Pt will perform STS with Max A +1 to LRAD. PT Short Term Goal 2 (Week 1): Pt will perform supine <> sit with Mod/ Max A. PT Short Term Goal 3 (Week 1): Pt will perform functional bed <> w/c transfers with Max A +1. PT Short Term Goal 4 (Week 1): Pt will tolerate standing for > 15mn. PT Short Term Goal 5 (Week 1): Pt will initiate gait training.  Skilled Therapeutic Interventions/Progress Updates:    Session 1: Pt received supine in bed with her daughter, MRosann Auerbach present and pt agreeable to therapy session. Pt's daughter reporting pt received medication for spasticity last night and that it has caused her to be drowsy today - pt's daughter reports pt is very sensitive to medications. Pt does report fatigue but during session has increased alertness. Supine>sitting R EOB, HOB flat but using bedrail, via logroll for increased pt independence - total assist for L hemibody management and trunk upright - cuing for sequencing and using R UE to assist with trunk. Sitting EOM, initially required min/mod assist due to R posterior lean but after having pt perform forward reaching task able to progress to close supervision/CGA. Sit>stand EOB>R HHA with +2 max assist for lifting to stand (therapist providing L UE support) - blocking L knee buckle - max manual facilitation for increased trunk/hip/knee extension for upright posture though pt not fully able to achieve due to sustained excessive trunk flexion with mild R trunk rotation. Tolerated standing ~157mute with heavy max assist and +2 mod assist. Pt continues to demo R gaze preference with cuing throughout session for L head turn and visual scanning to increase L  attention. Pt reports need to use bathroom. Sit>stand EOB>stedy with max assist of 1 for L hemibody management and lifting to stand with +2 assist to manage stedy seat. [facility] to maneuver stedy while therapist assisted with trunk control. Pt left in care of nurse tech with therapist recommending they receive +2 assistance to transfer pt back to w/c.   Session 2: Pt received supine in bed asking "where am I?" Therapist provided emotional support and reoriented pt to location and situation with pt continuing to demonstrate short term memory impairments repeatedly asking "what's wrong with me?" "Did I have a stroke?" Therapist continued to reorient patient and educate her on purpose of therapy. Donned B LE TED hose and shoes total assist. Supine>sitting R EOB, HOB flat but using bedrail, via logroll technique with total assist for L hemibody management and trunk upright - pt demos improved initiation of lifting L LE into hooklying position and of initiating rolling. Continues to have R posterior lean initially in sitting but after cuing to reach forward towards shoes has improved ability to maintain upright. Forward scoot towards EOB with mod assist for trunk balance (prevent R LOB) and max assist for scooting L hip forward. L squat pivot EOB>w/c with max assist of 1 and +2 mod assist for lifting/pivoting hips and maintaining trunk control (prevent R LOB).  Transported to/from gym in w/c for time management and energy conservation.  Sit<>stands 3x to/from litegait bar for R UE support and heavy max/total assist of 1 for lifting to stand and  blocking L knee buckle while +2 assist donned litegait harness. Donned L LE DF assist ACE wrap and provided support for L UE. Overground gait training ~78f with partial body weight support using litegait with +2 assist for liftegait management and max assist for L LE gait mechanics - pt able to advance L LE during swing with mod assist for increased  step length but then requires max/total assist for increased L hip/knee extension and L weight shift during stance. Doffed litegait harness.   Vitals: Prior to gait training: BP 143/77 After gait training: BP 130/73 (MAP 87), HR 81bpm   Pt requesting to return to bed and rest. R squat pivot w/c>EOB with max assist of 1 and +2 min assist with pt demoing increased ability to power up through B LEs to lift hips. Sit>supine with max assist for L UE and B LE management into the bed. Pt left supine in bed with needs in reach, LUE therapeutically positioned on pillows, call bell in hand, and bed alarm on.   Therapy Documentation Precautions:  Precautions Precautions: Fall Precaution Comments: L hemiparesis, R gaze preference Restrictions Weight Bearing Restrictions: No   Pain: Session 1: Continues to report L anterior hip pain upon transition from sit>standing and while standing - provided seated rest break for pain management.  Session 2: Reports L anterior hip pain initially during sit<>stands while donning litegait harness but once in partial body weight support and ambulating no complaints of pain.    Therapy/Group: Individual Therapy  CTawana Scale, PT, DPT, NCS, CSRS 06/06/2021, 12:24 PM

## 2021-06-06 NOTE — Progress Notes (Signed)
Occupational Therapy Session Note  Patient Details  Name: Vickie Mckee MRN: [id number] Date of Birth: January 03, 1931  Today's Date: 06/06/2021 OT Individual Time: [id number] OT Individual Time Calculation (min): 59 min    Short Term Goals: Week 1:  OT Short Term Goal 1 (Week 1): Pt will maintain static sitting balance EOB with close supervision for at least 2 mins in preparation for selfcare tasks. OT Short Term Goal 2 (Week 1): Pt will complete UB bathing sitting supported in wheelchair with min assist for 2 consecutive sessions. OT Short Term Goal 3 (Week 1): Pt will complete UB dressing with mod assist for donning a pullover shirt following hemi dressing techniques. OT Short Term Goal 4 (Week 1): Pt's family will return demonstrate safe assist with AAROM/PROM exercises for the RUE. OT Short Term Goal 5 (Week 1): Pt will complete squat pivot transfer to the drop arm commode with max assist.  Skilled Therapeutic Interventions/Progress Updates:    Session 1: [id number])  Pt worked on bathing and dressing EOB during session.  Max assist for supine to sit transition with max assist to scoot to the EOB.  Min guard for static sitting balance initially.  She needed max demonstrational cueing throughout to maintain upright trunk and cervical extension to neutral position.  She was unable to maintain for greater than a few seconds, demonstrating rounded posture.  Mod assist for UB bathing with total +2 for LB bathing sit to stand (pt 20%).  Increased flexed trunk and right lateral lean noted in standing when washing buttocks and completing pulling garments over hips.  Max hand over hand assist to integrate the LUE as a stabilizer with max assist also needed for crossing the LLE over the right knee when attempting to thread items over her feet.  Finished session with transfer back to the bed to rest with max assist secondary to fatigue and pt perseverating on laying down.  Donned PRAFO on the left foot for  positioning while in bed and also applied left resting hand splint to examine fit as well.  Removed resting hand splint before leaving with daughter in the room for supervision as well.  Call button and phone in reach.    Session 2: [phone])  Pt in bed during session with her daughter [name] present.  Provided education on LUE PROM exercises to be completed daily to help increase awareness and attention to the LUE as well as increasing functional movement.  Provided handout with return demonstration from her daughter assisting with exercises for shoulder flexion, shoulder abduction, elbow flex.extension, pronation/supination, wrist extension, and digit flexion/extension, all from supine with HOB flat to up position.  Pt needed mod instructional cueing to maintain sustained attention secondary to sleepiness.  Also discussed need for [facility] and resting hand splint on the LLE and LUE at night.  Communicated with nursing to make sure this is passed on during huddle.  Pt left in bed to rest before PT session in 15 mins.  Call button and phone in reach with safety alarm in place.    Therapy Documentation Precautions:  Precautions Precautions: Fall Precaution Comments: L hemiparesis, R gaze preference Restrictions Weight Bearing Restrictions: No  Pain: Pain Assessment Pain Scale: Faces Pain Score: 0-No pain Faces Pain Scale: Hurts a little bit Pain Type: Neuropathic pain Pain Location: Hip Pain Orientation: Left Pain Descriptors / Indicators: Discomfort Pain Onset: With Activity Pain Intervention(s): Repositioned PAINAD (Pain Assessment in Advanced Dementia) Negative Vocalization: none Facial Expression: smiling or inexpressive Body Language: relaxed  ADL: See Care Section for some details of mobility and selfcare  Therapy/Group: Individual Therapy  Ysmael Hires OTR/L 06/06/2021, 12:15 PM

## 2021-06-06 NOTE — Progress Notes (Signed)
PROGRESS NOTE   Subjective/Complaints: Pt does not remember me from yesterday or if she had leg spasms  ROS: Denies CP, SOB, N/V/D  Objective:   No results found. No results for input(s): WBC, HGB, HCT, PLT in the last 72 hours.  Recent Labs    06/05/21 1140  NA 141  K 3.4*  CL 105  CO2 29  GLUCOSE 119*  BUN 32*  CREATININE 0.97  CALCIUM 9.4     Intake/Output Summary (Last 24 hours) at 06/06/2021 0755 Last data filed at 06/05/2021 1825 Gross per 24 hour  Intake 374 ml  Output --  Net 374 ml         Physical Exam: Vital Signs Blood pressure (!) 131/47, pulse 67, temperature 97.9 F (36.6 C), temperature source Oral, resp. rate 16, height '5\' 2"'$  (1.575 m), weight 53.5 kg, SpO2 94 %.  General: No acute distress Mood and affect are appropriate Heart: Regular rate and rhythm no rubs murmurs or extra sounds Lungs: Clear to auscultation, breathing unlabored, no rales or wheezes Abdomen: Positive bowel sounds, soft nontender to palpation, nondistended Extremities: No clubbing, cyanosis, or edema Skin: No evidence of breakdown, no evidence of rash  Musc: No edema in extremities.  No tenderness in extremities. Neuro: Alert Motor: LUE: 0/5 proximal distal, unchanged LLE: Hip flexion, knee extension 2-/5, ankle dorsiflexion 0/5 Absent Triple flexor response LLE  Sensation intact LT on left side and right  Dysarthria  Assessment/Plan: 1. Functional deficits which require 3+ hours per day of interdisciplinary therapy in a comprehensive inpatient rehab setting. Physiatrist is providing close team supervision and 24 hour management of active medical problems listed below. Physiatrist and rehab team continue to assess barriers to discharge/monitor patient progress toward functional and medical goals  Care Tool:  Bathing    Body parts bathed by patient: Chest, Abdomen, Right upper leg, Left upper leg   Body parts  bathed by helper: Buttocks, Front perineal area, Right lower leg, Left lower leg, Face, Right arm, Left arm     Bathing assist Assist Level: 2 Helpers     Upper Body Dressing/Undressing Upper body dressing   What is the patient wearing?: Pull over shirt    Upper body assist Assist Level: Maximal Assistance - Patient 25 - 49%    Lower Body Dressing/Undressing Lower body dressing      What is the patient wearing?: Incontinence brief, Pants     Lower body assist Assist for lower body dressing: 2 Helpers     Toileting Toileting    Toileting assist Assist for toileting: 2 Helpers     Transfers Chair/bed transfer  Transfers assist     Chair/bed transfer assist level: 2 Helpers (squat piovt)     Locomotion Ambulation   Ambulation assist   Ambulation activity did not occur: Safety/medical concerns          Walk 10 feet activity   Assist  Walk 10 feet activity did not occur: Safety/medical concerns        Walk 50 feet activity   Assist Walk 50 feet with 2 turns activity did not occur: Safety/medical concerns         Walk 150  feet activity   Assist Walk 150 feet activity did not occur: Safety/medical concerns         Walk 10 feet on uneven surface  activity   Assist Walk 10 feet on uneven surfaces activity did not occur: Safety/medical concerns         Wheelchair     Assist Will patient use wheelchair at discharge?: Yes Type of Wheelchair: Manual Wheelchair activity did not occur: Safety/medical concerns         Wheelchair 50 feet with 2 turns activity    Assist    Wheelchair 50 feet with 2 turns activity did not occur: Safety/medical concerns       Wheelchair 150 feet activity     Assist  Wheelchair 150 feet activity did not occur: Safety/medical concerns       Blood pressure (!) 131/47, pulse 67, temperature 97.9 F (36.6 C), temperature source Oral, resp. rate 16, height '5\' 2"'$  (1.575 m), weight 53.5  kg, SpO2 94 %.      Medical Problem List and Plan: 1.   Left-sided hemiparesis with dysarthria/dysphagia secondary to right pontine infarction as well as multiple small remote bilateral cerebellar lacunar infarcts Continue CIR  Cont PRAFO, WHO nightly 2.  Impaired mobility: Continue Lovenox for DVT prophylaxis             -antiplatelet therapy: Aspirin 81 mg daily and Plavix 75 mg day x1 month then Plavix alone Flexor withdrawal spasms LLE-zanaflex at night may need Knee immobilizers  3. Pain Management/chronic back pain: Tylenol as needed 4. Depression: Continue Zoloft 100 mg daily, Aricept 5 mg nightly             -antipsychotic agents: N/A 5. Neuropsych: This patient is capable of making decisions on her own behalf. 6. Skin/Wound Care: Routine skin checks 7. Fluids/Electrolytes/Nutrition: Routine in and outs 8. Post stroke dysphagia.  Dysphagia #1 nectar thick liquids.  Follow-up speech therapy.  Monitor hydration   Advance diet as tolerated 9.  Hypertension.  Patient on verapamil 240 mg daily prior to admission as well as HCTZ 25 mg daily.     Vitals:   06/05/21 1934 06/06/21 0429  BP: 128/61 (!) 131/47  Pulse: 73 67  Resp: 16 16  Temp: 98.2 F (36.8 C) 97.9 F (36.6 C)  SpO2: 98% 94%    10.  Hyperlipidemia.  Lipitor 11.  Hypothyroidism.  TSH 2.093 .Continue Synthroid 12.  Constipation.  MiraLAX scheduled daily.  Senokot S2 tablets nightly  13. Hypokalemia: Supplementation started   KCL ordered 8/16 14. Crackles and coughing with food intake: placed nursing order for must be up with meals, resolved   Chest x-ray unremarkable for acute process 15.  Hypoalbuminemia  Supplement initiated on 8/13  LOS: 5 days A FACE TO Richton E Romanita Fager 06/06/2021, 7:55 AM

## 2021-06-06 NOTE — Progress Notes (Signed)
Speech Language Pathology Daily Session Note  Patient Details  Name: Vickie Mckee MRN: [id number] Date of Birth: 1931/02/16  Today's Date: 06/06/2021 SLP Individual Time: 1015-1100 SLP Individual Time Calculation (min): 45 min  Short Term Goals: Week 1: SLP Short Term Goal 1 (Week 1): Patient will consistently demonstrate orientation x4 with min A verbal cues and use of compensatory aids SLP Short Term Goal 2 (Week 1): Patient will demonstrate basic problem solving for basic tasks with min A verbal cues SLP Short Term Goal 3 (Week 1): Patient will use breath support techniques to increase vocal intensity at sentence level to achieve 90% intelligibility with sup A verbal cues SLP Short Term Goal 4 (Week 1): Patient will tolerate current diet with minimal-to-no overt s/sx of aspiration with min A verbal cues for use of swallowing compensatory strategies SLP Short Term Goal 5 (Week 1): Patient will demonstrate effective mastication and oral clearance of Dys 2 trials with no overt s/sx of aspiration over at least 2 observed sessions to demonstrate readiness for diet advancement.  Skilled Therapeutic Interventions: Patient agreeable to skilled ST intervention with focus on swallowing and cognitive goals. Patient appeared lethargic and consistently expressed "I'm so tired" with min A verbal/tactile cues to keep her eyes open. She performed oral care with min A for thoroughness. Patient was able to effectively orally expel residue into basin and did not require use of suction. Patient consumed ~3oz of honey thick liquids without pharyngeal symptoms including no overt s/sx of aspiration and clear vocal quality post swallows.Continue honey thick liquids at this time. SLP facilitated pharyngeal strengthening exercises including chin tuck against resistance (CTAR) using towel and sup A verbal cues to effectively execute x20. Patient was oriented to person, and situation. She was not oriented to time including  day of week/mo/year. She was inconsistently oriented to place and required mod-to-max A verbal/visual cues to recall and utilize context clues to assist orientation. Patient was left in bed with alarm activated and immediate needs within reach at end of session. Continue per current plan of care.      Pain Pain Assessment Pain Scale: 0-10 Pain Score: 0-No pain Faces Pain Scale: No hurt PAINAD (Pain Assessment in Advanced Dementia) Negative Vocalization: none Facial Expression: smiling or inexpressive Body Language: relaxed  Therapy/Group: Individual Therapy  Aarvi Stotts T Karolina Zamor 06/06/2021, 11:05 AM

## 2021-06-07 NOTE — Progress Notes (Signed)
Patient ID: Vickie Mckee, female   DOB: 1930-11-07, 87 y.o.   MRN: [id number] Met with the patient to introduce self and the role of the nurse CM. Re viewed situation and current medications. Patient fixated on lack of feeling or movement in left arm. Reported she understood medications including DAPT x 30 days then plavix solo per MD for stroke prevention. Reviewed heart healthy diet and modifications for lipid management once diet consistency was upgraded; currently on D1 Honey thick diet. Continue to follow along to discharge to address educational needs. Handouts for reference of material covered left in the room. Margarito Liner, RN

## 2021-06-07 NOTE — Progress Notes (Signed)
Patient ID: Vickie Mckee, female   DOB: December 21, 1930, 87 y.o.   MRN: [id number]  SW covering for primary [place].   SW met with pt in room to provide d/c date 9/9. Pt unable to communicate with SW.  SW spoke with pt dtr Butch Penny [phone]) to provide updates from team conference and d/c date 9/9. Pt dtr would like to discuss options given how much care pt may require at d/c. SW discussed home with [facility] and private pay for sitter for additional assistance if needed; or SNF short term rehab. SW informed Margreta Journey will f/u with updates next week to discuss further.   Loralee Pacas, MSW, [place] Office: [phone] Cell: [phone] Fax: [phone]

## 2021-06-07 NOTE — Progress Notes (Signed)
Physical Therapy Session Note  Patient Details  Name: Vickie Mckee MRN: [id number] Date of Birth: 1931/06/09  Today's Date: 06/07/2021 PT Individual Time: 1535-1650 PT Individual Time Calculation (min): 75 min   Short Term Goals: Week 1:  PT Short Term Goal 1 (Week 1): Pt will perform STS with Max A +1 to LRAD. PT Short Term Goal 2 (Week 1): Pt will perform supine <> sit with Mod/ Max A. PT Short Term Goal 3 (Week 1): Pt will perform functional bed <> w/c transfers with Max A +1. PT Short Term Goal 4 (Week 1): Pt will tolerate standing for > 12mn. PT Short Term Goal 5 (Week 1): Pt will initiate gait training.  Skilled Therapeutic Interventions/Progress Updates:    Pt received supine in bed with her daughter, DButch Penny present and pt agreeable to therapy session reporting she took a nap this afternoon and is feeling more rested now. Supine rolling L using bedrail with mod assist and rolling R with max/total assist with pt demonstrating increasing ability to lift L LE into hooklying position even compared to yesterday - donned litegait harness in supine. Supine>sitting R EOB, HOB flat but using bedrail, via logroll technique with heavy max assist for B LE management off EOB and for trunk upright - cuing for increased use of R UE to assist with trunk. Continues to demo R posterior trunk lean upon sitting EOB requiring mod/max assist to maintain upright - cued for pt to reach towards R foot to promote anterior trunk lean with pt demoing decreased posterior LOB with min/mod assist. Sitting EOB with +2 assist for trunk control during total assist to don shoes. L squat pivot EOB>w/c with max/total assist for lifting/pivoting hips and maintaining trunk control due to R lean - allowing increased time for pt to initiate powering up through B LEs.  Transported to/from gym in w/c for time management and energy conservation.  Donned L LE ankle DF assist ACE wrap. Sit>stand w/c>R UE support on litegait with heavy  total assist of 1 for lifting to stand, blocking L knee buckle, and facilitating increased trunk/hip/knee extension to achieve upright (pt continues to have R anterior trunk lean despite cuing and facilitation for improvement) while +2 assist attached harness to litegait support.   Gait training ~[id number] in litegait harness providing partial body weight support with +2 assist to manage litegait and max assist from therapist to facilitate L LE gait mechanics - pt continues to be able to initiate L LE swing phase advancement requiring max assist to complete step through pattern and facilitation to sustain R weight shift - demos increased L LE glute and quad activation for increased extension during stance phase with pt responding well to cuing to "stand tall". Doffed litegait harness.  Transported back to room. R squat pivot transfer w/c>EOB with total assist primarily for pivoting hips and lifting with cuing for head/hips relationship and increased anterior trunk lean. Sit>supine with total assist for trunk descent and B LE management onto bed. Supine scoot towards HOB with max assist and pt demoing recall of her ability to complete bridge and lift hips. Educated pt's daughter on proper positioning of LLE including floating the heels and avoiding excessive hip external rotation. Pt left supine in bed with needs in reach, L hemibody positioned on pillows, HOB elevated, bed alarm on, and her daughter present.  Therapy Documentation Precautions:  Precautions Precautions: Fall Precaution Comments: L hemiparesis, R gaze preference Restrictions Weight Bearing Restrictions: No  Pain:  Reports L  anterior hip pain described as "stretching" while coming to stand with litegait - pt reports it is tolerable and does not need intervention at this time.   Therapy/Group: Individual Therapy  Tawana Scale , PT, DPT, NCS, CSRS 06/07/2021, 3:17 PM

## 2021-06-07 NOTE — Patient Care Conference (Signed)
Inpatient RehabilitationTeam Conference and Plan of Care Update Date: 06/07/2021   Time: 10:48 AM    Patient Name: Vickie Mckee      Medical Record Number: [id number]  Date of Birth: 21-Dec-1930 Sex: Female         Room/Bed: 4M06C/4M06C-01 Payor Info: Payor: MEDICARE / Plan: MEDICARE PART A AND B / Product Type: *No Product type* /    Admit Date/Time:  06/01/2021  1:51 PM  Primary Diagnosis:  Left hemiplegia [facility])  Hospital Problems: Principal Problem:   Left hemiplegia ([place]) Active Problems:   Right pontine cerebrovascular accident ([place])   Hypoalbuminemia due to protein-calorie malnutrition ([place])   Hypokalemia   Dysphagia, post-stroke    Expected Discharge Date: Expected Discharge Date: 06/30/21  Team Members Present: Physician leading conference: Dr. Alysia Penna Social Worker Present: Loralee Pacas, [place] Nurse Present: Dorien Chihuahua, RN PT Present: Page Spiro, PT OT Present: Clyda Greener, OT SLP Present: Sherren Kerns, SLP PPS Coordinator present : Gunnar Fusi, SLP     Current Status/Progress Goal Weekly Team Focus  Bowel/Bladder   Patient is currently incontinent of bowel and bladder. LBM: 06/06/21.  Reduced episodes of incontinence.  Nursing staff will offer appropriate toileting for patient q2h and prn.   Swallow/Nutrition/ Hydration   Dys 1, honey thick - min A  Sup A  PO tolerance, swallowing exercises, consider repeat MBS by end of this week or early next week   ADL's   Max assist for UB selfcare with total +2 for LB selfcare.  Max assist for squat pivot transfers to the wheelchair and bed.  left inattention with severe LLE and LUE hemiparesis.  Brunnstrum stage II in the left arm with trace shoulder movements but stage I in the hand.  Decreased endurance  min to mod assist  selfcare retraining, transfer training, balance retraining, DME education, therapeutic exercises, splinting, neuromuscular re-education   Mobility   total assist supine>sit, max  assist sit>supine, +2 max assist squat pivot transfers, +2 total assist sit<>stands with R UE support, total assist gait training 41f using litegait for partial body weight support; L inattention and impaired endurance  min assist overall  activity tolerance, L LE NMR, bed mobility training, transfer training, standing tolerance, pt/family education, sitting balance/trunk control   Communication   min A  sup A  breath support, vocal intensity   Safety/Cognition/ Behavioral Observations  max A - patient has been limited by lethargy  min A  orientation, sustained attention, working and short-term memory, basic problem solving   Pain   Patient c/o pain on the faces pain scale 2/10 to left arm.  Patients pain level will remain <=2  Nursing staff will assess pain level q shift and prn. Administer prn medications for pain when needed.   Skin   Patient is free from skin breakdown.  Patient will continue to remain free from skin breakdown.  Nursing staff will monitor skin for breakdown q shift and prn. Assist patient with repositioning q2h. Apply left wrist splint.     Discharge Planning:  Pt to d/c to home with her daughters who will provide Min A.   Team Discussion: Post stroke with mild BP lability and fatigue. Patient is incontinent and drowsy, confused and disoriented but aware that she is not thinking clearly. Noted short term recall limited and has static sitting balance issues; falls to the left side with a flexed head and trunk. Patient requires cues to sit upright and she has a flexed posture when she stands.  Patient is incontinent of bowel and bladder; toilet protocol initiated. Patient on target to meet rehab goals: Currently mod assist for upper body bathing and max assist for dressing and total assist +2 for lower body bathing and dressing. Patient able to complete BM tasks with total assist for left side management and +2 for squat pivot, total assist to stand due to pain in left hip and  knee buckling. Patient was able to ambulate up to 67' with body weight support. Patient requires min assist for alertness. Currently on D1 honey thick diet as she fatigues with chewing. Requires min assist for vocal intensity, and max assist overall for cognition.   *See Care Plan and progress notes for long and short-term goals.   Revisions to Treatment Plan:  Downgraded goals set on admission due to premorbid memory issues, disorientation, short tern recall limitations. Working on [employer], orientation, basic problem solving.  Trial D2 diet with MBS later in the week.  Teaching Needs: Transfers, toileting, safety, medications, secondary risk management, dietary modifications, etc.  Current Barriers to Discharge: Decreased caregiver support, Home enviroment access/layout, and Incontinence  Possible Resolutions to Barriers: Family education SNF recommended if 24/7 physical assistance not available     Medical Summary Current Status: Mild blood pressure lability, fatigue, flexor withdrawal spasms left lower extremity, memory deficits, dysphagia  Barriers to Discharge: Medical stability   Possible Resolutions to Barriers/Weekly Focus: Repeat modified barium swallow this week, discontinue tizanidine and monitor   Continued Need for Acute Rehabilitation Level of Care: The patient requires daily medical management by a physician with specialized training in physical medicine and rehabilitation for the following reasons: Direction of a multidisciplinary physical rehabilitation program to maximize functional independence : Yes Medical management of patient stability for increased activity during participation in an intensive rehabilitation regime.: Yes Analysis of laboratory values and/or radiology reports with any subsequent need for medication adjustment and/or medical intervention. : Yes   I attest that I was present, lead the team conference, and concur with the assessment and plan of the  team.   Dorien Chihuahua B 06/07/2021, 11:17 AM

## 2021-06-07 NOTE — Progress Notes (Signed)
PROGRESS NOTE   Subjective/Complaints: Per SLP pt tolerating honey thick liquids, plan for repeat MBS on Friday  Pt does not recall seeing me, c/o fatigue, states she sleeps well  ROS: Denies CP, SOB, N/V/D  Objective:   No results found. No results for input(s): WBC, HGB, HCT, PLT in the last 72 hours.  Recent Labs    06/05/21 1140  NA 141  K 3.4*  CL 105  CO2 29  GLUCOSE 119*  BUN 32*  CREATININE 0.97  CALCIUM 9.4     Intake/Output Summary (Last 24 hours) at 06/07/2021 0949 Last data filed at 06/07/2021 0831 Gross per 24 hour  Intake 318 ml  Output --  Net 318 ml         Physical Exam: Vital Signs Blood pressure (!) 144/64, pulse 66, temperature 97.6 F (36.4 C), temperature source Oral, resp. rate 18, height '5\' 2"'$  (1.575 m), weight 53.5 kg, SpO2 94 %.   General: No acute distress Mood and affect are appropriate Heart: Regular rate and rhythm no rubs murmurs or extra sounds Lungs: Clear to auscultation, breathing unlabored, no rales or wheezes Abdomen: Positive bowel sounds, soft nontender to palpation, nondistended Extremities: No clubbing, cyanosis, or edema Skin: No evidence of breakdown, no evidence of rash    Musc: No edema in extremities.  No tenderness in extremities. Neuro: Alert Motor: LUE: 0/5 proximal distal, unchanged LLE: Hip flexion, knee extension 2-/5, ankle dorsiflexion 0/5 Absent Triple flexor response LLE  Sensation intact LT on left side and right  Dysarthria  Assessment/Plan: 1. Functional deficits which require 3+ hours per day of interdisciplinary therapy in a comprehensive inpatient rehab setting. Physiatrist is providing close team supervision and 24 hour management of active medical problems listed below. Physiatrist and rehab team continue to assess barriers to discharge/monitor patient progress toward functional and medical goals  Care Tool:  Bathing    Body  parts bathed by patient: Left arm, Right upper leg, Left upper leg, Abdomen, Chest, Face   Body parts bathed by helper: Left lower leg, Right lower leg, Right arm, Front perineal area, Buttocks     Bathing assist Assist Level: 2 Helpers     Upper Body Dressing/Undressing Upper body dressing   What is the patient wearing?: Pull over shirt    Upper body assist Assist Level: Maximal Assistance - Patient 25 - 49%    Lower Body Dressing/Undressing Lower body dressing      What is the patient wearing?: Incontinence brief, Pants     Lower body assist Assist for lower body dressing: 2 Helpers     Toileting Toileting    Toileting assist Assist for toileting: 2 Helpers     Transfers Chair/bed transfer  Transfers assist     Chair/bed transfer assist level: 2 Helpers (squat pivot max + mod)     Locomotion Ambulation   Ambulation assist   Ambulation activity did not occur: Safety/medical concerns  Assist level: Total Assistance - Patient < 25% Assistive device: Lite Gait Max distance: 80f   Walk 10 feet activity   Assist  Walk 10 feet activity did not occur: Safety/medical concerns        Walk  50 feet activity   Assist Walk 50 feet with 2 turns activity did not occur: Safety/medical concerns         Walk 150 feet activity   Assist Walk 150 feet activity did not occur: Safety/medical concerns         Walk 10 feet on uneven surface  activity   Assist Walk 10 feet on uneven surfaces activity did not occur: Safety/medical concerns         Wheelchair     Assist Will patient use wheelchair at discharge?: Yes Type of Wheelchair: Manual Wheelchair activity did not occur: Safety/medical concerns         Wheelchair 50 feet with 2 turns activity    Assist    Wheelchair 50 feet with 2 turns activity did not occur: Safety/medical concerns       Wheelchair 150 feet activity     Assist  Wheelchair 150 feet activity did not  occur: Safety/medical concerns       Blood pressure (!) 144/64, pulse 66, temperature 97.6 F (36.4 C), temperature source Oral, resp. rate 18, height '5\' 2"'$  (1.575 m), weight 53.5 kg, SpO2 94 %.      Medical Problem List and Plan: 1.   Left-sided hemiparesis with dysarthria/dysphagia secondary to right pontine infarction as well as multiple small remote bilateral cerebellar lacunar infarcts Continue CIR  Cont PRAFO, WHO nightly 2.  Impaired mobility: Continue Lovenox for DVT prophylaxis             -antiplatelet therapy: Aspirin 81 mg daily and Plavix 75 mg day x1 month then Plavix alone Flexor withdrawal spasms LLE-zanaflex at night but may be causing am fatigue will d/c 3. Pain Management/chronic back pain: Tylenol as needed 4. Depression: Continue Zoloft 100 mg daily, Aricept 5 mg nightly             -antipsychotic agents: N/A 5. Neuropsych: This patient is capable of making decisions on her own behalf. 6. Skin/Wound Care: Routine skin checks 7. Fluids/Electrolytes/Nutrition: Routine in and outs 8. Post stroke dysphagia.  Dysphagia #1 nectar thick liquids.  Follow-up speech therapy.  Monitor hydration   Advance diet as tolerated 9.  Hypertension.  Patient on verapamil 240 mg daily prior to admission as well as HCTZ 25 mg daily.     Vitals:   06/06/21 2012 06/07/21 0427  BP: (!) 130/57 (!) 144/64  Pulse: 84 66  Resp: 18 18  Temp: 97.6 F (36.4 C) 97.6 F (36.4 C)  SpO2: 96% 94%   Good control 10.  Hyperlipidemia.  Lipitor 11.  Hypothyroidism.  TSH 2.093 .Continue Synthroid 12.  Constipation.  MiraLAX scheduled daily.  Senokot S2 tablets nightly  13. Hypokalemia: Supplementation started   KCL ordered 8/16 14. Crackles and coughing with food intake: placed nursing order for must be up with meals, resolved   Chest x-ray unremarkable for acute process- per SLP fluids downgraded to Honey thick with repeat MBS for Friday  15.  Hypoalbuminemia  Supplement initiated on  8/13  LOS: 6 days A FACE TO FACE EVALUATION WAS PERFORMED  Charlett Blake 06/07/2021, 9:49 AM

## 2021-06-07 NOTE — Progress Notes (Signed)
Speech Language Pathology Daily Session Note  Patient Details  Name: Vickie Mckee MRN: [id number] Date of Birth: October 22, 1931  Today's Date: 06/07/2021 SLP Individual Time: 0920-1000 SLP Individual Time Calculation (min): 40 min  Short Term Goals: Week 1: SLP Short Term Goal 1 (Week 1): Patient will consistently demonstrate orientation x4 with min A verbal cues and use of compensatory aids SLP Short Term Goal 2 (Week 1): Patient will demonstrate basic problem solving for basic tasks with min A verbal cues SLP Short Term Goal 3 (Week 1): Patient will use breath support techniques to increase vocal intensity at sentence level to achieve 90% intelligibility with sup A verbal cues SLP Short Term Goal 4 (Week 1): Patient will tolerate current diet with minimal-to-no overt s/sx of aspiration with min A verbal cues for use of swallowing compensatory strategies SLP Short Term Goal 5 (Week 1): Patient will demonstrate effective mastication and oral clearance of Dys 2 trials with no overt s/sx of aspiration over at least 2 observed sessions to demonstrate readiness for diet advancement.  Skilled Therapeutic Interventions: Patient agreeable to skilled ST intervention with focus on swallowing and cognitive goals. Patient was received sleeping supine in bed upon arrival and was easy to arouse. She appeared drowsy and c/o sleepiness constantly throughout session. She continues to exhibit decreased vocal intensity and breath support for speech secondary to overall weakness and fatigue. Facilitated sustained attention, simple reasoning, and working [employer] with card matching task (1. Color, 2. Number). Patient completed with sup A verbal cues for sustained attention for 10 minute duration. Patient was only oriented to person and exhibited decreased recall of DOW/Mo/Yr/situation following short-term delay (approximately 1 minute). Facilitated PO trials with Dys 2 textures in which patient exhibited prolonged  mastication, oral fatigue, and mild oral residue. She expressed "I can't get it down" and required liquid rinse to aid with oral clearance. Patient does not appear appropriate for diet advancement at this time secondary to oral deficits and fatigue however plan to continue trials. Patient exhibited no overt s/sx of aspiration with honey thick liquid cup sips. Patient was left in bed with alarm activated and immediate needs within reach at end of session. Continue per current plan of care.     Pain - no pain   Therapy/Group: Individual Therapy  Patty Sermons 06/07/2021, 10:24 AM

## 2021-06-07 NOTE — Progress Notes (Signed)
Occupational Therapy Session Note  Patient Details  Name: Vickie Mckee MRN: [id number])) Date of Birth: [birth year]  Today's Date: 06/07/2021 OT Individual Time: 1002-1028 OT Individual Time Calculation (min): 26 min    Short Term Goals: Week 1:  OT Short Term Goal 1 (Week 1): Pt will maintain static sitting balance EOB with close supervision for at least 2 mins in preparation for selfcare tasks. OT Short Term Goal 2 (Week 1): Pt will complete UB bathing sitting supported in wheelchair with min assist for 2 consecutive sessions. OT Short Term Goal 3 (Week 1): Pt will complete UB dressing with mod assist for donning a pullover shirt following hemi dressing techniques. OT Short Term Goal 4 (Week 1): Pt's family will return demonstrate safe assist with AAROM/PROM exercises for the RUE. OT Short Term Goal 5 (Week 1): Pt will complete squat pivot transfer to the drop arm commode with max assist.  Skilled Therapeutic Interventions/Progress Updates:    Session 1: [id number])  Pt in bed during session with work on rolling in bed in order to complete peri washing and LB dressing.  She needs max assist in supine for washing front and back peri area with mod assist for rolling to the left and total assist to the right.  Total assist for donning new brief as well as for donning pants over her feet and then rolling to pull them up over her hips.  Once on her left side, she did assist some with pulling up over the right hip.  Total assist for donning TEDs.  She was left in bed to rest in preparation for OT returning to transfer out of the bed.  Session 2: (1106-1200)  Pt in bed with her daughter present working on changing pants.  She was not aware that therapist had just assisted her with this in last session.  Total assist for rolling to the right to pull them up over her hips with mod assist to roll to the left.  She then needed max assist for transition to sitting from supine.  Min guard for static sitting  balance with max assist for reciprical scooting to the EOB.  Completed squat pivot transfer to the wheelchair with max assist in order to transfer to the gym.  Transferred to the therapy mat at the same level for work on dynamic trunk control.  She was able to complete functional reaching task with the LUE to match up playing cards with cards on the back of the mirror.  Max demonstrational cueing needed to maintain upright posture in her head and trunk with mod assist needed to regain balance when reaching across her body to the left with the RUE.  Finished session with return to the wheelchair at max assist squat pivot and then return to the room.  Call button and phone in reach with safety belt in place in preparation for lunch.    Therapy Documentation Precautions:  Precautions Precautions: Fall Precaution Comments: L hemiparesis, R gaze preference Restrictions Weight Bearing Restrictions: No  Pain: Pain Assessment Pain Scale: Faces Pain Score: 0-No pain ADL: See Care Tool Section for some details of mobility and selfcare   Therapy/Group: Individual Therapy  Lin Glazier OTR/L 06/07/2021, 11:05 AM

## 2021-06-08 LAB — CREATININE, SERUM
Creatinine, Ser: 0.92 mg/dL (ref 0.44–1.00)
GFR, Estimated: >60 mL/min (ref >60)

## 2021-06-08 MED ORDER — POTASSIUM CHLORIDE 20 MEQ/15ML (10%) PO SOLN
10.0000 meq | Freq: Every day | ORAL | Status: DC
  Administered 2021-06-08 – 2021-06-30 (×23): 10 meq via ORAL
  Filled 2021-06-08 (×23): qty 15

## 2021-06-08 MED ORDER — FAMOTIDINE 20 MG PO TABS
40.0000 mg | ORAL_TABLET | Freq: Every day | ORAL | Status: DC
  Administered 2021-06-08 – 2021-06-27 (×20): 40 mg via ORAL
  Filled 2021-06-08 (×20): qty 2

## 2021-06-08 NOTE — Progress Notes (Signed)
Physical Therapy Session Note  Patient Details  Name: Vickie Mckee MRN: [id number] Date of Birth: 06-Aug-1931  Today's Date: 06/08/2021 PT Individual Time: [id number] PT Individual Time Calculation (min): 43 min   Short Term Goals: Week 1:  PT Short Term Goal 1 (Week 1): Pt will perform STS with Max A +1 to LRAD. PT Short Term Goal 2 (Week 1): Pt will perform supine <> sit with Mod/ Max A. PT Short Term Goal 3 (Week 1): Pt will perform functional bed <> w/c transfers with Max A +1. PT Short Term Goal 4 (Week 1): Pt will tolerate standing for > 53mn. PT Short Term Goal 5 (Week 1): Pt will initiate gait training.  Skilled Therapeutic Interventions/Progress Updates:     Pt supine in bed to start session. Both daughter's at bedside reporting pt feels the urge to need to toilet.   Supine<>sit required maxA with HOB slightly elevated and use of log rolling technique with assist for both BLE management and trunk elevation. Continues to show strong sacral sitting and flexed thoracic posture while sitting EOB. Used Stedy due to urgency and completed sit<>stand to [place] maxA - need to monitor L foot to avoid inversion at the ankle. Able to sit in perched position with CGA on the Stedy. [facility]. Sit<>stand from perched postiion with maxA and assist for controlled lowering to the stedy. With time provided, pt was continent of bladder with a small smear of a BM. Required dependant care for posterior pericare - was able to complete frontal pericare with setupA. Extra time needed for cleanliness/thorughness as well as for completing multiple sit<>stands in the [place] Pt completed Stedy transfer back to bed at dependant level in similar manner as above and then assisted to supine position with +2 totalA. Required +2 totalA for supine scooting to HOB and rolled in bed (modA to L, totalA to R) to don new clean brief and pants. Pt ended session supine in bed  with LUE supported with pillow and all needs in reach.   Therapy Documentation Precautions:  Precautions Precautions: Fall Precaution Comments: L hemiparesis, R gaze preference Restrictions Weight Bearing Restrictions: No General:    Therapy/Group: Individual Therapy  CAlger Simons8/18/2022, 1:43 PM

## 2021-06-08 NOTE — Plan of Care (Signed)
  Problem: RH BOWEL ELIMINATION Goal: RH STG MANAGE BOWEL WITH ASSISTANCE Description: STG Manage Bowel with min Assistance. Outcome: Not Progressing; incontinence   Problem: RH BLADDER ELIMINATION Goal: RH STG MANAGE BLADDER WITH ASSISTANCE Description: STG Manage Bladder With min Assistance Outcome: Not Progressing; incontinence   

## 2021-06-08 NOTE — Progress Notes (Signed)
Physical Therapy Session Note  Patient Details  Name: Vickie Mckee MRN: [id number] Date of Birth: 1931-06-02  Today's Date: 06/08/2021 PT Individual Time: [id number] PT Individual Time Calculation (min): 56 min   Short Term Goals: Week 1:  PT Short Term Goal 1 (Week 1): Pt will perform STS with Max A +1 to LRAD. PT Short Term Goal 2 (Week 1): Pt will perform supine <> sit with Mod/ Max A. PT Short Term Goal 3 (Week 1): Pt will perform functional bed <> w/c transfers with Max A +1. PT Short Term Goal 4 (Week 1): Pt will tolerate standing for > 66mn. PT Short Term Goal 5 (Week 1): Pt will initiate gait training.  Skilled Therapeutic Interventions/Progress Updates:    Pt received supine in bed, asleep and upon awakening pt reports she is "so sleepy." With min encouragement pt agreeable to therapy session. Supine>sitting R EOB, HOB flat but using bedrail, via logroll technique to increase pt independence with total assist for L hemibody management and trunk upright - continued cuing to push up with R arm to assist with trunk, demos poor recall of this. Sitting EOB initially requires mod/max assist for trunk control due to R posterior lean but after sequentially scooting hips to EOB with max assist for L side while assisting with trunk control and then performing anterior reaching task pt able to progress to CGA for trunk control.   L squat pivot EOB>w/c with total assist of 1 for lifting/pivoting hips and maintaining trunk control (prevent R LOB) with cuing for head/hips relationship, anterior trunk lean, and powering up through LEs to clear hips during transfer. Transported to/from gym in w/c for time management and energy conservation. R squat pivot transfer w/c>EOM with total assist of 1 as described above.   Sit>stand EOB>R HHA from +2 assist with total assist of 1 for lifting to stand and blocking L knee buckle while facilitating increased hip/knee extension and trunk upright - pt continues to  have excessive R anterior trunk flexion with difficulty achieving upright in midline.   Standing with total assist of 1 for balance and maintaining L LE in hip/knee extension performed R UE reaching task to grasp clothespin and place on string attached up high on mirror to promote increased trunk/hip extension and upright posture - performed 2-3 clothespins x3 times requiring seated rest break between each. After standing task pt demos ability to maintain improved upright, midline trunk posture in sitting.   Sitting balance task to remove clothespins from string focusing on anterior weight shift and trunk extension for upright posture - CGA with intermittent min assist for sitting balance.   L squat pivot EOB>w/c as described above - also noted therapist having to provide manual facilitation for L LE positioning during transfer. Transported pt back to room and assisted with return to bed as pt requesting to rest. R squat pivot to EOB as above. Sit>supine with max assist of 1 for trunk descent and L hemibody management onto bed. Supine scoot towards HOB via bridging with total assist of 1 - pt demos adequate hip clearance during bridge. Pt left supine in bed with needs in reach, HOB elevated, bed alarm on, call button in hand, and her daughters present.  Therapy Documentation Precautions:  Precautions Precautions: Fall Precaution Comments: L hemiparesis, R gaze preference Restrictions Weight Bearing Restrictions: No   Pain:  Upon initial stand reported L anterior hip pain but pt reports it is tolerable and does not report same pain in subsequent stands.  Reports L shoulder pain - therapist providing support for that UE during mobility tasks throughout session.   Therapy/Group: Individual Therapy  Tawana Scale , PT, DPT, NCS, CSRS 06/08/2021, 7:57 AM

## 2021-06-08 NOTE — Progress Notes (Signed)
Nutrition Follow-up  DOCUMENTATION CODES:   Not applicable  INTERVENTION:  Continue Ensure Enlive po BID (thickened to appropriate consistency), each supplement provides 350 kcal and 20 grams of protein  Continue 30 ml Prosource plus po BID, each supplement provides 100 kcal and 15 grams of protein.   Encourage adequate PO intake.   NUTRITION DIAGNOSIS:   Increased nutrient needs related to  (therapy) as evidenced by estimated needs; ongoing  GOAL:   Patient will meet greater than or equal to 90% of their needs; progressing  MONITOR:   PO intake, Supplement acceptance, Skin, Weight trends, Labs, I & O's, Diet advancement  REASON FOR ASSESSMENT:   Malnutrition Screening Tool    ASSESSMENT:   87 year old female with history of hypertension hypothyroidism GERD chronic low back pain, hypertrophic obstructive cardiomyopathy. Presents with acute onset of left-sided weakness and dysarthria as well as left lateral gaze. MRI showed acute right pontine infarction. Multiple small remote bilateral cerebellar lacunar infarcts. Therapy evaluations completed due to patient's left-sided weakness dysarthria/dysphagia was admitted for a comprehensive rehab program.  Pt is currently on a dysphagia 1 diet with honey thick liquids. Meal completion has been 30-90%. Family members at bedside report pt has been tolerating her po well. Family to continue to encourage pt PO intake at meals. Pt currently has Ensure and Prosource plus and has been consuming them. RD to continue with current orders to aid in caloric and protein needs.   NUTRITION - FOCUSED PHYSICAL EXAM:  Flowsheet Row Most Recent Value  Orbital Region Unable to assess  Upper Arm Region No depletion  Thoracic and Lumbar Region No depletion  Buccal Region Unable to assess  Temple Region Unable to assess  Clavicle Bone Region No depletion  Clavicle and Acromion Bone Region No depletion  Scapular Bone Region Unable to assess  Dorsal  Hand Unable to assess  Patellar Region Mild depletion  Anterior Thigh Region Mild depletion  Posterior Calf Region Mild depletion  Edema (RD Assessment) None  Hair Reviewed  Eyes Reviewed  Mouth Reviewed  Skin Reviewed  Nails Reviewed      Labs and medications reviewed.   Diet Order:   Diet Order             DIET - DYS 1 Room service appropriate? Yes; Fluid consistency: Honey Thick  Diet effective now                   EDUCATION NEEDS:   Not appropriate for education at this time  Skin:  Skin Assessment: Reviewed RN Assessment  Last BM:  8/17  Height:   Ht Readings from Last 1 Encounters:  06/02/21 '5\' 2"'$  (1.575 m)    Weight:   Wt Readings from Last 1 Encounters:  06/04/21 53.5 kg    BMI:  Body mass index is 21.57 kg/m.  Estimated Nutritional Needs:   Kcal:  1550-1700  Protein:  75-85 grams  Fluid:  >/= 1.5 L/day  Corrin Parker, MS, RD, LDN RD pager number/after hours weekend pager number on Amion.

## 2021-06-08 NOTE — Progress Notes (Signed)
Occupational Therapy Session Note  Patient Details  Name: Vickie Mckee MRN: [id number] Date of Birth: 12/12/1930  Today's Date: 06/08/2021 OT Individual Time: 0802-0900 OT Individual Time Calculation (min): 58 min    Short Term Goals: Week 1:  OT Short Term Goal 1 (Week 1): Pt will maintain static sitting balance EOB with close supervision for at least 2 mins in preparation for selfcare tasks. OT Short Term Goal 2 (Week 1): Pt will complete UB bathing sitting supported in wheelchair with min assist for 2 consecutive sessions. OT Short Term Goal 3 (Week 1): Pt will complete UB dressing with mod assist for donning a pullover shirt following hemi dressing techniques. OT Short Term Goal 4 (Week 1): Pt's family will return demonstrate safe assist with AAROM/PROM exercises for the RUE. OT Short Term Goal 5 (Week 1): Pt will complete squat pivot transfer to the drop arm commode with max assist.  Skilled Therapeutic Interventions/Progress Updates:    Session 1: [id number])  Pt in bed to start session eating breakfast.  Worked on cleaning peri area, donning new brief, and donning pants in supine prior to transfer to the EOB to work on finishing self feeding.  Min assist for rolling to the left with max assist for rolling to the right.  Total assist for cleaning buttocks but then she was able to complete washing her front area with setup.  Total assist for donning clothing as well, rolling side to side to pull them up over her hips.  Transitioned to sitting with total assist where she continued working on self feeding while working on sitting balance.  Min assist overall with occasional lean to the left, which she was able to report feeling and then correct.  Mod instructional cueing with self feeding to slow down as she would try to continue taking bites before swallowing.  Second part of session focused on dressing with max assist to remove button up pajama top and then donn a new pullover top, following  hemi dressing techniques.  She was then able to doff LB clothing with total +2 (pt 20%) sit to stand.  Worked on donning new pants following hemi techniques.  Max assist for crossing the LLE over the right knee and maintaining it while working on donning pants.  Max assist for donning them over the foot and leg as well as for the right side.  Total + 2 (pt 20%) for standing to pull items over his hips.    Session 2: [id number])  Pt in bed resting to begin session.  Started with education on bed mobility with pt's daughters and positioning of the LUE and LLE to assist with rolling to the left.  Max assist needed to complete rolling to the right with min assist to the left using the left rail.  Discussed that if pt needs assist with toileting, donning LB clothing, or washing her peri area, this would be the safest method of assist at this time.  Next, had her transfer to the EOB with total assist to work on LUE functional movement, trunk control, and posture.  Pt continues to exhibit posterior pelvic tilt with increased cervical protraction and flexion.  Mod demonstrational cueing to maintain upright posture while working on functional reach with the LUE.  Max hand over hand assist for reaching to pick up her deodorant as well as a tissue.  Trace shoulder movements noted in extension and flexion with attempted reach.  Finished session with transition to supine and LUE positioned on  pillows with left PRAFO in place.  Call button in reach with her daughters in the room as well.    Therapy Documentation Precautions:  Precautions Precautions: Fall Precaution Comments: L hemiparesis, R gaze preference Restrictions Weight Bearing Restrictions: No  Pain:  No report of pain  ADL: See Care Tool Section for some details of mobility and selfcare   Therapy/Group: Individual Therapy  Rashiya Lofland OTR/L 06/08/2021, 3:48 PM

## 2021-06-08 NOTE — Progress Notes (Signed)
Speech Language Pathology Daily Session Note  Patient Details  Name: Vickie Mckee MRN: [id number] Date of Birth: 01/19/1931  Today's Date: 06/08/2021 SLP Individual Time: 1115-1200 SLP Individual Time Calculation (min): 45 min  Short Term Goals: Week 1: SLP Short Term Goal 1 (Week 1): Patient will consistently demonstrate orientation x4 with min A verbal cues and use of compensatory aids SLP Short Term Goal 2 (Week 1): Patient will demonstrate basic problem solving for basic tasks with min A verbal cues SLP Short Term Goal 3 (Week 1): Patient will use breath support techniques to increase vocal intensity at sentence level to achieve 90% intelligibility with sup A verbal cues SLP Short Term Goal 4 (Week 1): Patient will tolerate current diet with minimal-to-no overt s/sx of aspiration with min A verbal cues for use of swallowing compensatory strategies SLP Short Term Goal 5 (Week 1): Patient will demonstrate effective mastication and oral clearance of Dys 2 trials with no overt s/sx of aspiration over at least 2 observed sessions to demonstrate readiness for diet advancement.  Skilled Therapeutic Interventions: Patient agreeable to skilled ST intervention with focus on swallowing and cognitive goals. Patient was alert and complained of drowsiness however this did not negatively impact her participation in treatment. She was accompanied by her daughters. Patient had urine incontinence episode resulting in peri care and lower body dressing at bed level. Patient completed peri care with mod A and total A for lower body dressing. Daughters assisted SLP with adjusting positioning in bed and during lower body dressing. Patient was oriented to person, place (hospital in [place]), and general timeframe for time (e.g., "it's almost fall"). She selected correct year when given field of 2 choices. SLP facilitated PO trials of nectar thick liquids in which patient consumed 4oz by cup with overt s/sz of  aspiration including cough and throat clearing approximately 50% of trials. Improvement with cough strength subjectively noted. She consumed honey thick liquid cup sips with no overt s/sx of aspiration and clear vocal quality post swallows. Based on continued s/sx of aspiration with nectar thick liquids, continue honey thick liquids at this time. Informed daughters. Facilitated chin-tuck against resistance x10 with sup A verbal cues. Patient was left in bed with alarm activated, immediate needs within reach, and daughters at bedside at end of session. Continue per current plan of care.      Pain - 2/10 patient complained of discomfort in her left shoulder which improved with repositioning   Therapy/Group: Individual Therapy  Patty Sermons 06/08/2021, 3:50 PM

## 2021-06-08 NOTE — Progress Notes (Signed)
PROGRESS NOTE   Subjective/Complaints:  ROS: Denies CP, SOB, N/V/D  Objective:   No results found. No results for input(s): WBC, HGB, HCT, PLT in the last 72 hours.  Recent Labs    06/05/21 1140 06/08/21 0449  NA 141  --   K 3.4*  --   CL 105  --   CO2 29  --   GLUCOSE 119*  --   BUN 32*  --   CREATININE 0.97 0.92  CALCIUM 9.4  --      Intake/Output Summary (Last 24 hours) at 06/08/2021 0724 Last data filed at 06/07/2021 2046 Gross per 24 hour  Intake 343 ml  Output --  Net 343 ml         Physical Exam: Vital Signs Blood pressure (!) 128/58, pulse 65, temperature 97.8 F (36.6 C), temperature source Oral, resp. rate 16, height '5\' 2"'$  (1.575 m), weight 53.5 kg, SpO2 96 %.  General: No acute distress Mood and affect are appropriate Heart: Regular rate and rhythm no rubs murmurs or extra sounds Lungs: Clear to auscultation, breathing unlabored, no rales or wheezes Abdomen: Positive bowel sounds, soft nontender to palpation, nondistended Extremities: No clubbing, cyanosis, or edema Skin: No evidence of breakdown, no evidence of rash  Musc: No edema in extremities.  No tenderness in extremities. Neuro: Alert Motor: LUE: 0/5 proximal distal, unchanged- tone reduced LUE LLE: Hip flexion, knee extension 2-/5, ankle dorsiflexion 0/5 Absent Triple flexor response LLE  Sensation intact LT on left side and right  Dysarthria  Assessment/Plan: 1. Functional deficits which require 3+ hours per day of interdisciplinary therapy in a comprehensive inpatient rehab setting. Physiatrist is providing close team supervision and 24 hour management of active medical problems listed below. Physiatrist and rehab team continue to assess barriers to discharge/monitor patient progress toward functional and medical goals  Care Tool:  Bathing    Body parts bathed by patient: Left arm, Right upper leg, Left upper leg, Abdomen,  Chest, Face   Body parts bathed by helper: Left lower leg, Right lower leg, Right arm, Front perineal area, Buttocks     Bathing assist Assist Level: 2 Helpers     Upper Body Dressing/Undressing Upper body dressing   What is the patient wearing?: Pull over shirt    Upper body assist Assist Level: Maximal Assistance - Patient 25 - 49%    Lower Body Dressing/Undressing Lower body dressing      What is the patient wearing?: Incontinence brief, Pants     Lower body assist Assist for lower body dressing: Total Assistance - Patient < 25% (supine rolling)     Toileting Toileting    Toileting assist Assist for toileting: 2 Helpers     Transfers Chair/bed transfer  Transfers assist     Chair/bed transfer assist level: Total Assistance - Patient < 25% (squat pivot)     Locomotion Ambulation   Ambulation assist   Ambulation activity did not occur: Safety/medical concerns  Assist level: 2 helpers Assistive device: Lite Gait Max distance: 77f   Walk 10 feet activity   Assist  Walk 10 feet activity did not occur: Safety/medical concerns  Walk 50 feet activity   Assist Walk 50 feet with 2 turns activity did not occur: Safety/medical concerns         Walk 150 feet activity   Assist Walk 150 feet activity did not occur: Safety/medical concerns         Walk 10 feet on uneven surface  activity   Assist Walk 10 feet on uneven surfaces activity did not occur: Safety/medical concerns         Wheelchair     Assist Will patient use wheelchair at discharge?: Yes Type of Wheelchair: Manual Wheelchair activity did not occur: Safety/medical concerns         Wheelchair 50 feet with 2 turns activity    Assist    Wheelchair 50 feet with 2 turns activity did not occur: Safety/medical concerns       Wheelchair 150 feet activity     Assist  Wheelchair 150 feet activity did not occur: Safety/medical concerns       Blood  pressure (!) 128/58, pulse 65, temperature 97.8 F (36.6 C), temperature source Oral, resp. rate 16, height '5\' 2"'$  (1.575 m), weight 53.5 kg, SpO2 96 %.      Medical Problem List and Plan: 1.   Left-sided hemiparesis with dysarthria/dysphagia secondary to right pontine infarction as well as multiple small remote bilateral cerebellar lacunar infarcts Continue CIR  Cont PRAFO, WHO nightly 2.  Impaired mobility: Continue Lovenox for DVT prophylaxis             -antiplatelet therapy: Aspirin 81 mg daily and Plavix 75 mg day x1 month then Plavix alone Flexor withdrawal spasms LLE-zanaflex at night but may be causing am fatigue will d/c 3. Pain Management/chronic back pain: Tylenol as needed 4. Depression: Continue Zoloft 100 mg daily, Aricept 5 mg nightly             -antipsychotic agents: N/A 5. Neuropsych: This patient is capable of making decisions on her own behalf. 6. Skin/Wound Care: Routine skin checks 7. Fluids/Electrolytes/Nutrition: Routine in and outs 8. Post stroke dysphagia.  Dysphagia #1 nectar thick liquids.  Follow-up speech therapy.  Monitor hydration   Advance diet as tolerated 9.  Hypertension.  Patient on verapamil 240 mg daily prior to admission as well as HCTZ 25 mg daily.     Vitals:   06/07/21 2119 06/08/21 0528  BP: (!) 128/55 (!) 128/58  Pulse: 76 65  Resp: 18 16  Temp: 97.8 F (36.6 C) 97.8 F (36.6 C)  SpO2: 95% 96%   Good control 8/18 10.  Hyperlipidemia.  Lipitor 11.  Hypothyroidism.  TSH 2.093 .Continue Synthroid 12.  Constipation.  MiraLAX scheduled daily.  Senokot S2 tablets nightly  13. Hypokalemia: Supplementation started   KCL ordered 8/16 14. Crackles and coughing with food intake: placed nursing order for must be up with meals, resolved   Chest x-ray unremarkable for acute process- per SLP fluids downgraded to Honey thick with repeat MBS for Friday  15.  Hypoalbuminemia  Supplement initiated on 8/13  LOS: 7 days A FACE TO [place] E Santez [place] 06/08/2021, 7:24 AM

## 2021-06-08 NOTE — Progress Notes (Signed)
Speech Language Pathology Weekly Progress and Session Note  Patient Details  Name: Vickie Mckee MRN: [id number] Date of Birth: 13-Apr-1931  Beginning of progress report period: June 02, 2021 End of progress report period: June 09, 2021  Today's Date: 06/09/2021 SLP Individual Time: 0804-0900 SLP Individual Time Calculation (min): 56 min  Short Term Goals: Week 1: SLP Short Term Goal 1 (Week 1): Patient will consistently demonstrate orientation x4 with min A verbal cues and use of compensatory aids SLP Short Term Goal 1 - Progress (Week 1): Revised due to lack of progress SLP Short Term Goal 2 (Week 1): Patient will demonstrate basic problem solving for basic tasks with min A verbal cues SLP Short Term Goal 2 - Progress (Week 1): Met SLP Short Term Goal 3 (Week 1): Patient will use breath support techniques to increase vocal intensity at sentence level to achieve 90% intelligibility with sup A verbal cues SLP Short Term Goal 3 - Progress (Week 1): Revised due to lack of progress SLP Short Term Goal 4 (Week 1): Patient will tolerate current diet with minimal-to-no overt s/sx of aspiration with min A verbal cues for use of swallowing compensatory strategies SLP Short Term Goal 4 - Progress (Week 1): Met SLP Short Term Goal 5 (Week 1): Patient will demonstrate effective mastication and oral clearance of Dys 2 trials with no overt s/sx of aspiration over at least 2 observed sessions to demonstrate readiness for diet advancement. SLP Short Term Goal 5 - Progress (Week 1): Progressing toward goal  New Short Term Goals: Week 2: SLP Short Term Goal 1 (Week 2): Patient will demonstrate orientation x4 with sup A verbal cues to refer to compensatory aids SLP Short Term Goal 2 (Week 2): Patient will demonstrate basic problem solving for basic tasks with sup-to-min A verbal cues SLP Short Term Goal 3 (Week 2): Patient will achieve 75-90% intelligibility with sup A verbal cues to increase vocal  intensity at sentence level to more effectively communicate functional needs SLP Short Term Goal 4 (Week 2): Patient will tolerate current diet with minimal-to-no overt s/sx of aspiration with sup A verbal cues for use of swallowing compensatory strategies SLP Short Term Goal 5 (Week 2): Patient will demonstrate effective mastication and oral clearance of Dys 2 trials with no overt s/sx of aspiration over at least 2 observed sessions to demonstrate readiness for diet advancement. SLP Short Term Goal 6 (Week 2): Patient will sustain attention for 8 minute intervals with min A verbal redirection  Weekly Progress Updates: Patient has met 2 of 5 short term goals this reporting period. Patient has made slow progress with ST over the past week due to lethargy and fatigue. From a cognitive standpoint, SLP is currently addressing orientation, sustained attention, basic problem solving, and working memory in which patient ranges form min-to-mod A for basic tasks. For swallowing, she exhibited consistent s/sx of aspiration with nectar thick liquids resulting in the decision to downgrade to honey thick liquids which she tolerates well without overt s/sx of aspiration. She has participated in Dys 2 PO trials although still exhibiting significant oral weakness and fatigue which negatively impacted bolus breakdown and increased concern for swallow safety. Recommend continuation of Dys 1 diet and honey thick liquids at this time with consideration fof repeat MBS next week. For speech, she continues to exhibit decreased vocal intensity and breath support which fluctuates with fatigue. Patient/family education is ongoing as her two daughters are present during treatment often. Recommend continued skilled ST intervention to  maximize cognitive, swallowing, and speech function and functional independence prior to discharge. Continue progression toward established LTGs set at min A assist level overall.    Intensity: Minumum of  1-2 x/day, 30 to 90 minutes Frequency: 3 to 5 out of 7 days Duration/Length of Stay: 09/09 Treatment/Interventions: Cognitive remediation/compensation;Environmental controls;Internal/external aids;Speech/Language facilitation;Cueing hierarchy;Dysphagia/aspiration precaution training;Functional tasks;Patient/family education;Medication managment;Therapeutic Activities  Daily Session Skilled Therapeutic Interventions: Patient agreeable to skilled ST intervention with focus on cognitive and speech goals. Patient appeared more lethargic today with increased cueing to sustain and re-direct attention. Patient was oriented to person and situation, although unable to report place, day of week, month, or year. SLP printed orientation visuals and placed across from patient's bed in which she was able to refer to information and correctly report with min A verbal reminders. Facilitated abstract reasoning with common object picture cards at sup A level with appropriate basic reasoning. Facilitated use of incentive spirometer to assess appropriateness for RMT to increase breath support for speech and vocal intensity. Patient completed 10 repetitions raging between 1100 and 1250 with sup A verbal cues. Patient would be appropriate to eval/trial RMT. Patient was noted to exhibit delayed cough with liquid medicine thickened to honey thick consistency which may have attributed to drowsiness. No further s/sx of aspiration observed with honey thick liquids. Dys 2 PO trials were not attempted due to drowsiness. Patient was left in bed with alarm activated and immediate needs within reach at end of session. Continue per current plan of care.     General    Pain Pain Assessment Pain Scale: Faces Pain Score: 0-No pain  Therapy/Group: Individual Therapy  Patty Sermons 06/09/2021, 12:29 PM

## 2021-06-09 NOTE — Progress Notes (Signed)
Occupational Therapy Weekly Progress Note  Patient Details  Name: Vickie Mckee MRN: [id number] Date of Birth: May 10, 1931  Beginning of progress report period: June 02, 2021 End of progress report period: June 09, 2021  Today's Date: 06/09/2021 OT Individual Time: 0905-1003 OT Individual Time Calculation (min): 58 min    Patient has met 1 of 4 short term goals.  Pt is making slow but steady progress with OT at this time.  Currently, she still needs mod assist for UB bathing with max assist for UB dressing.  LB bathing and dressing are total +2 (pt 25%) sit to stand stand and total assist when completed supine rolling in bed.  Sitting balance is at min guard statically with supervision for short periods of time.  Mod to max is still needed for dynamic sitting secondary to posterior LOB as well as LOB to the left.  LUE demonstrates only trace shoulder movement currently as Brunnstrum stage II with stage I flaccidity in the hand.  Max hand over hand assist is needed to integrate the LUE into functional tasks as a stabilizer.  Ms. Pesci continues to need total assist for squat pivot transfers to different surfaces with total +2 (pt 25%)for standing to complete toilet hygiene and clothing management.  Endurance continues to be limiting with her stating frequently that she is tired and sleepy.  Encouragement is given to help her stay out of the bed for periods of time in order to increase her endurance.  She does exhibit right head gaze at times, but this is improving and she tends to scan left of midline more spontaneous during selfcare tasks.  Feel she will benefit from continued CIR level therapy to work toward min to mod assist level goals.    Patient continues to demonstrate the following deficits: muscle weakness, muscle joint tightness, and muscle paralysis, decreased cardiorespiratoy endurance, impaired timing and sequencing, unbalanced muscle activation, and decreased coordination, decreased  attention to left, decreased problem solving and decreased memory, and decreased sitting balance, decreased standing balance, decreased postural control, hemiplegia, and decreased balance strategies and therefore will continue to benefit from skilled OT intervention to enhance overall performance with BADL and Reduce care partner burden.  Patient progressing toward long term goals..  Continue plan of care.  OT Short Term Goals Week 2:  OT Short Term Goal 1 (Week 2): Pt will complete UB bathing sitting supported in wheelchair with min assist for 2 consecutive sessions. OT Short Term Goal 2 (Week 2): Pt will complete UB dressing with mod assist for donning a pullover shirt following hemi dressing techniques. OT Short Term Goal 3 (Week 2): Pt will complete squat pivot transfer to the drop arm commode with max assist.  Skilled Therapeutic Interventions/Progress Updates:    Pt worked on bathing and dressing during session.   Increased fatigue noted with pt keeping her eyes closed to start.  Worked on doffing brief, washing front and back peri area, and then donning new brief in supine.  She was able to complete washing front peri area in supine with HOB elevated with setup assist.  She then needed total assist for washing her buttocks as well as donning new brief.  Max assist for transfer to  sitting from the right side of the bed.  She was able to complete static sitting with supervision statically.  Min to mod once working on bathing tasks.  She needed mod assist for washing UB with max assist to donn pullover shirt.  She then worked  on LB bathing in sitting with mod assist.  Donned pull up pants with total assist +2 (pt 25%) overall.  Worked on crossing her LLE over the right knee and maintaining with max assist while donning pants over the LLE. Max demonstrational cueing throughout session to maintain upright trunk and head as she falls consistently into a posterior pelvic tilt.  Therapist donned socks and  slip on shoes with total assist while mod to max assist was given for dynamic sitting balance by rehab tech.  Finished session with squat pivot transfer to the wheelchair at max assist level where she remained up to build endurance.  Call button and phone in lap with safety belt in place.  Nursing made aware of pt being up and needing to go back to bed in about one hour.    Therapy Documentation Precautions:  Precautions Precautions: Fall Precaution Comments: L hemiparesis, R gaze preference Restrictions Weight Bearing Restrictions: No  Pain: Pain Assessment Pain Scale: Faces Pain Score: 0-No pain Faces Pain Scale: No hurt ADL: See Care Tool Section for some details of mobility and selfcare   Therapy/Group: Individual Therapy  Amalea Ottey OTR/L 06/09/2021, 10:26 AM

## 2021-06-09 NOTE — Progress Notes (Signed)
PROGRESS NOTE   Subjective/Complaints: No issues overnite  ROS: Denies CP, SOB, N/V/D  Objective:   No results found. No results for input(s): WBC, HGB, HCT, PLT in the last 72 hours.  Recent Labs    06/08/21 0449  CREATININE 0.92     Intake/Output Summary (Last 24 hours) at 06/09/2021 0724 Last data filed at 06/08/2021 2001 Gross per 24 hour  Intake 320 ml  Output --  Net 320 ml         Physical Exam: Vital Signs Blood pressure (!) 142/65, pulse 69, temperature 97.6 F (36.4 C), temperature source Oral, resp. rate 16, height '5\' 2"'$  (1.575 m), weight 53.5 kg, SpO2 96 %.   General: No acute distress Mood and affect are appropriate Heart: Regular rate and rhythm no rubs murmurs or extra sounds Lungs: Clear to auscultation, breathing unlabored, no rales or wheezes Abdomen: Positive bowel sounds, soft nontender to palpation, nondistended Extremities: No clubbing, cyanosis, or edema Skin: No evidence of breakdown, no evidence of rash Musc: No edema in extremities.  No tenderness in extremities. Neuro: Alert Motor: LUE: 0/5 proximal distal, unchanged- tone reduced LUE LLE: Hip flexion, knee extension 2-/5, ankle dorsiflexion 0/5 Absent Triple flexor response LLE  Sensation intact LT on left side and right  Dysarthria  Assessment/Plan: 1. Functional deficits which require 3+ hours per day of interdisciplinary therapy in a comprehensive inpatient rehab setting. Physiatrist is providing close team supervision and 24 hour management of active medical problems listed below. Physiatrist and rehab team continue to assess barriers to discharge/monitor patient progress toward functional and medical goals  Care Tool:  Bathing    Body parts bathed by patient: Left arm, Right upper leg, Left upper leg, Abdomen, Chest, Face   Body parts bathed by helper: Left lower leg, Right lower leg, Right arm, Front perineal area,  Buttocks     Bathing assist Assist Level: 2 Helpers     Upper Body Dressing/Undressing Upper body dressing   What is the patient wearing?: Pull over shirt    Upper body assist Assist Level: Maximal Assistance - Patient 25 - 49%    Lower Body Dressing/Undressing Lower body dressing      What is the patient wearing?: Incontinence brief, Pants     Lower body assist Assist for lower body dressing: 2 Helpers (supine-sit- stand)     Chartered loss adjuster assist Assist for toileting: 2 Helpers     Transfers Chair/bed transfer  Transfers assist     Chair/bed transfer assist level: Total Assistance - Patient < 25% (squat pivot)     Locomotion Ambulation   Ambulation assist   Ambulation activity did not occur: Safety/medical concerns  Assist level: 2 helpers Assistive device: Lite Gait Max distance: 39f   Walk 10 feet activity   Assist  Walk 10 feet activity did not occur: Safety/medical concerns        Walk 50 feet activity   Assist Walk 50 feet with 2 turns activity did not occur: Safety/medical concerns         Walk 150 feet activity   Assist Walk 150 feet activity did not occur: Safety/medical concerns  Walk 10 feet on uneven surface  activity   Assist Walk 10 feet on uneven surfaces activity did not occur: Safety/medical concerns         Wheelchair     Assist Will patient use wheelchair at discharge?: Yes Type of Wheelchair: Manual Wheelchair activity did not occur: Safety/medical concerns         Wheelchair 50 feet with 2 turns activity    Assist    Wheelchair 50 feet with 2 turns activity did not occur: Safety/medical concerns       Wheelchair 150 feet activity     Assist  Wheelchair 150 feet activity did not occur: Safety/medical concerns       Blood pressure (!) 142/65, pulse 69, temperature 97.6 F (36.4 C), temperature source Oral, resp. rate 16, height '5\' 2"'$  (1.575 m),  weight 53.5 kg, SpO2 96 %.      Medical Problem List and Plan: 1.   Left-sided hemiparesis with dysarthria/dysphagia secondary to right pontine infarction as well as multiple small remote bilateral cerebellar lacunar infarcts Continue CIR  Cont PRAFO, WHO nightly 2.  Impaired mobility: Continue Lovenox for DVT prophylaxis             -antiplatelet therapy: Aspirin 81 mg daily and Plavix 75 mg day x1 month then Plavix alone Flexor withdrawal spasms LLE-zanaflex at night but may be causing am fatigue will d/c 3. Pain Management/chronic back pain: Tylenol as needed 4. Depression: Continue Zoloft 100 mg daily, Aricept 5 mg nightly             -antipsychotic agents: N/A 5. Neuropsych: This patient is capable of making decisions on her own behalf. 6. Skin/Wound Care: Routine skin checks 7. Fluids/Electrolytes/Nutrition: Routine in and outs 8. Post stroke dysphagia.  Dysphagia #1 nectar thick liquids.  Follow-up speech therapy.  Monitor hydration   Advance diet as tolerated 9.  Hypertension.  Patient on verapamil 240 mg daily prior to admission as well as HCTZ 25 mg daily.     Vitals:   06/08/21 1954 06/09/21 0504  BP: (!) 131/58 (!) 142/65  Pulse: 77 69  Resp: 20 16  Temp: 97.9 F (36.6 C) 97.6 F (36.4 C)  SpO2: 97% 96%   Good control 8/19 10.  Hyperlipidemia.  Lipitor 11.  Hypothyroidism.  TSH 2.093 .Continue Synthroid 12.  Constipation.  MiraLAX scheduled daily.  Senokot S2 tablets nightly  13. Hypokalemia: Supplementation started   KCL ordered 8/16 14. Crackles and coughing with food intake: placed nursing order for must be up with meals, resolved   Chest x-ray unremarkable for acute process- per SLP fluids downgraded to Honey thick with repeat MBS for Friday  15.  Hypoalbuminemia  Supplement initiated on 8/13  LOS: 8 days A FACE TO [place] E Zafir Schauer 06/09/2021, 7:24 AM

## 2021-06-09 NOTE — Progress Notes (Signed)
Physical Therapy Session Note  Patient Details  Name: Vickie Mckee MRN: [id number] Date of Birth: 11/30/1930  Today's Date: 06/09/2021 PT Individual Time: 7903-8333 PT Individual Time Calculation (min): 54 min   Short Term Goals: Week 1:  PT Short Term Goal 1 (Week 1): Pt will perform STS with Max A +1 to LRAD. PT Short Term Goal 2 (Week 1): Pt will perform supine <> sit with Mod/ Max A. PT Short Term Goal 3 (Week 1): Pt will perform functional bed <> w/c transfers with Max A +1. PT Short Term Goal 4 (Week 1): Pt will tolerate standing for > 48mn. PT Short Term Goal 5 (Week 1): Pt will initiate gait training.   Skilled Therapeutic Interventions/Progress Updates:   Pt received supine in bed and agreeable to PT. Supine>sit transfer with max assist and max cues  for sequencing. Sitting balance EOB x5 min with mod progressing to min A assist. [facility]   Sit<>stand in parallel bars x 3 with total A to facilitate trunkal extension in stance and blcoking the LLE. Weight shift R and L x 2 bouts with LLE blocked x 4 Bil with 5 sec hold each direction.   Pt returned to room and performed SB transfer to bed with max A. Sitting balance EOB with trunkal NMR to perform AP weight shift with no UE support x 10 with min assist. Lateral rotation R and L x 10 with min assist. Hold at end range against mild resistance x 5 bil. Sit>supine completed with max assist from PT for BLE management. Supine NMR: trunkal rotation R and L x 8 bil and x 5 for stabilizing reversals. SAQ x 8 BLE, hip abduction x 8 BLE. AAROM on the LLE. Pt left supine in bed with call bell in reach and all needs met.       Therapy Documentation Precautions:  Precautions Precautions: Fall Precaution Comments: L hemiparesis, R gaze preference Restrictions Weight Bearing Restrictions: No    Vital Signs: Therapy Vitals Temp: 97.9 F (36.6 C) Pulse Rate: (!)  59 Resp: 16 BP: 122/66 Patient Position (if appropriate): Lying Oxygen Therapy SpO2: 97 % O2 Device: Room Air Pain: denies    Therapy/Group: Individual Therapy  ALorie Phenix8/19/2022, 3:35 PM

## 2021-06-10 NOTE — Progress Notes (Signed)
Physical Therapy Session Note  Patient Details  Name: Vickie Mckee MRN: [id number] Date of Birth: [birth year]  Today's Date: 06/10/2021 PT Individual Time: 1010-1105 PT Individual Time Calculation (min): 55 min   Short Term Goals: Week 1:  PT Short Term Goal 1 (Week 1): Pt will perform STS with Max A +1 to LRAD. PT Short Term Goal 2 (Week 1): Pt will perform supine <> sit with Mod/ Max A. PT Short Term Goal 3 (Week 1): Pt will perform functional bed <> w/c transfers with Max A +1. PT Short Term Goal 4 (Week 1): Pt will tolerate standing for > 79mn. PT Short Term Goal 5 (Week 1): Pt will initiate gait training.   Skilled Therapeutic Interventions/Progress Updates:  Patient supine in bed on entrance to room. Patient initially asleep and requires short period to fully wake. Then pt alert and agreeable to PT session though she relates fatigue. Patient denied pain during session.  Therapeutic Activity: Bed Mobility: Patient performed roll to L side with Min A and to R side with Max/ Total A with vc for segmental rolling technique in both directions. LB dressing Max A while in supine. Sidelying --> sit with Max A. VC/ tc required for technique, and effort.   Neuromuscular Re-ed: NMR facilitated during session with focus on muscle facilitation of trunk, shoulders, and BLE, midline orientation, joint approximation, and proprioception. Pt guided in sitting balance while EOB. Work while seated on EOB on upright posture with anterior rotation of pelvis requiring Min/ Mod A with tc at lower back and along lateral trunk. Progressed to forward lean from upright position. Armchair placed in front of pt with R palm on seat and pt leaned forward onto arm for improved confidence and safety with forward lean and BLE extension into modified stance with block to L knee. Performed with Max A +2. Progressed to BUE to seat to promote joint approximation to LUE and LLE as well as to promote safe forward lean with  upright posture.   STS performed Max A +2 with RUE to walker and requiring full block to L knee to prevent buckling. Pt assisted with vc/ tc into shoulders back and upright trunk with anterior rotation of pelvis Performed x2 with pt able to stand for ~45sec each bout.   Guided in shoulder shrugs x8 until loss of active movement. Also guided in scap squeezes and chin tucks with education on how to perform in supine while in bed to assist with increasing neck posture strength. When supine in bed, pt able to perform bridging with CGA while ankles immobilized with good clearance of bed surface x10. NMR performed for improvements in motor control and coordination, balance, sequencing, judgement, and self confidence/ efficacy in performing all aspects of mobility at highest level of independence.   Patient supine  in bed at end of session with brakes locked, bd alarm set, and all needs within reach.     Therapy Documentation Precautions:  Precautions Precautions: Fall Precaution Comments: L hemiparesis, R gaze preference Restrictions Weight Bearing Restrictions: No  Therapy/Group: Individual Therapy  JAlger SimonsPT, DPT 06/10/2021, 12:57 PM

## 2021-06-10 NOTE — Progress Notes (Signed)
PROGRESS NOTE   Subjective/Complaints: No issues overnite , pt oriented with help of sign on wall No pain c/os  ROS: Denies CP, SOB, N/V/D  Objective:   No results found. No results for input(s): WBC, HGB, HCT, PLT in the last 72 hours.  Recent Labs    06/08/21 0449  CREATININE 0.92     Intake/Output Summary (Last 24 hours) at 06/10/2021 0859 Last data filed at 06/09/2021 1747 Gross per 24 hour  Intake 180 ml  Output --  Net 180 ml         Physical Exam: Vital Signs Blood pressure 133/65, pulse 70, temperature 98.1 F (36.7 C), temperature source Oral, resp. rate 16, height '5\' 2"'$  (1.575 m), weight 53.5 kg, SpO2 94 %.  General: No acute distress Mood and affect are appropriate Heart: Regular rate and rhythm no rubs murmurs or extra sounds Lungs: Clear to auscultation, breathing unlabored, no rales or wheezes Abdomen: Positive bowel sounds, soft nontender to palpation, nondistended Extremities: No clubbing, cyanosis, or edema Skin: No evidence of breakdown, no evidence of rash  Motor: LUE: 0/5 proximal distal, unchanged- tone reduced LUE LLE: Hip flexion, knee extension 2-/5, ankle dorsiflexion 0/5 Absent Triple flexor response LLE  Sensation intact LT on left side and right  Dysarthria  Assessment/Plan: 1. Functional deficits which require 3+ hours per day of interdisciplinary therapy in a comprehensive inpatient rehab setting. Physiatrist is providing close team supervision and 24 hour management of active medical problems listed below. Physiatrist and rehab team continue to assess barriers to discharge/monitor patient progress toward functional and medical goals  Care Tool:  Bathing    Body parts bathed by patient: Left arm, Right upper leg, Left upper leg, Abdomen, Chest, Face, Front perineal area   Body parts bathed by helper: Buttocks, Right lower leg, Left lower leg, Right arm     Bathing assist  Assist Level: 2 Helpers     Upper Body Dressing/Undressing Upper body dressing   What is the patient wearing?: Pull over shirt    Upper body assist Assist Level: Maximal Assistance - Patient 25 - 49%    Lower Body Dressing/Undressing Lower body dressing      What is the patient wearing?: Incontinence brief, Pants     Lower body assist Assist for lower body dressing: 2 Helpers     Toileting Toileting    Toileting assist Assist for toileting: 2 Helpers     Transfers Chair/bed transfer  Transfers assist     Chair/bed transfer assist level: Total Assistance - Patient < 25% (squat pivot)     Locomotion Ambulation   Ambulation assist   Ambulation activity did not occur: Safety/medical concerns  Assist level: 2 helpers Assistive device: Lite Gait Max distance: 67f   Walk 10 feet activity   Assist  Walk 10 feet activity did not occur: Safety/medical concerns        Walk 50 feet activity   Assist Walk 50 feet with 2 turns activity did not occur: Safety/medical concerns         Walk 150 feet activity   Assist Walk 150 feet activity did not occur: Safety/medical concerns  Walk 10 feet on uneven surface  activity   Assist Walk 10 feet on uneven surfaces activity did not occur: Safety/medical concerns         Wheelchair     Assist Will patient use wheelchair at discharge?: Yes Type of Wheelchair: Manual Wheelchair activity did not occur: Safety/medical concerns         Wheelchair 50 feet with 2 turns activity    Assist    Wheelchair 50 feet with 2 turns activity did not occur: Safety/medical concerns       Wheelchair 150 feet activity     Assist  Wheelchair 150 feet activity did not occur: Safety/medical concerns       Blood pressure 133/65, pulse 70, temperature 98.1 F (36.7 C), temperature source Oral, resp. rate 16, height '5\' 2"'$  (1.575 m), weight 53.5 kg, SpO2 94 %.      Medical Problem List and  Plan: 1.   Left-sided hemiparesis with dysarthria/dysphagia secondary to right pontine infarction as well as multiple small remote bilateral cerebellar lacunar infarcts Continue CIR  Cont PRAFO, WHO nightly 2.  Impaired mobility: Continue Lovenox for DVT prophylaxis             -antiplatelet therapy: Aspirin 81 mg daily and Plavix 75 mg day x1 month then Plavix alone Flexor withdrawal spasms LLE-zanaflex at night but may be causing am fatigue will d/c 3. Pain Management/chronic back pain: Tylenol as needed 4. Depression: Continue Zoloft 100 mg daily, Aricept 5 mg nightly             -antipsychotic agents: N/A 5. Neuropsych: This patient is capable of making decisions on her own behalf. 6. Skin/Wound Care: Routine skin checks 7. Fluids/Electrolytes/Nutrition: Routine in and outs 8. Post stroke dysphagia.  Dysphagia #1 nectar thick liquids.  Follow-up speech therapy.  Monitor hydration   Advance diet as tolerated 9.  Hypertension.  Patient on verapamil 240 mg daily prior to admission as well as HCTZ 25 mg daily.     Vitals:   06/09/21 1927 06/10/21 0526  BP: (!) 144/66 133/65  Pulse: 88 70  Resp: 16 16  Temp: 98.4 F (36.9 C) 98.1 F (36.7 C)  SpO2: 95% 94%   Good control 8/20 10.  Hyperlipidemia.  Lipitor 11.  Hypothyroidism.  TSH 2.093 .Continue Synthroid 12.  Constipation.  MiraLAX scheduled daily.  Senokot S2 tablets nightly  13. Hypokalemia: Supplementation started   KCL ordered 8/16 14. Crackles and coughing with food intake: placed nursing order for must be up with meals, resolved   Chest x-ray unremarkable for acute process- per SLP fluids downgraded to Honey thick with repeat MBS for Friday  15.  Hypoalbuminemia  Supplement initiated on 8/13  LOS: 9 days A FACE TO [place] E Jaicob Dia 06/10/2021, 8:59 AM

## 2021-06-12 NOTE — Progress Notes (Signed)
Physical Therapy Weekly Progress Note  Patient Details  Name: Vickie Mckee MRN: [id number] Date of Birth: [birth year]  Beginning of progress report period: June 02, 2021 End of progress report period: June 12, 2021  Today's Date: 06/12/2021 PT Individual Time: 2202-5427 PT Individual Time Calculation (min): 43 min   Patient has met 2 of 5 short term goals.  Vickie Mckee demonstrates slow, steady progress with therapy. She is performing bed mobility rolling L with min assist, rolling R with total assist, supine>sit with total assist and sit>supine with max assist. She is performing sit<>stands with +2 total assist for blocking L knee buckle and facilitating increased trunk/hip/knee extension and squat pivot transfers with total assist of 1. She is participating in high intensity gait training with body weight support using litegait system to promote L LE NMR, improved balance, increased activity tolerance, and gait training reaching up to 49f. During gait training she demonstrates ability to advance L LE ~25% of the way during swing but requires total assist to facilitate extension during stance. She continues to demonstrate dense L hemiplegia (UE>LE), significant cognitive impairments, impaired awareness, and impaired activity tolerance. She will benefit from continued CIR level therapies to promote increased independence with functional mobility.   Patient continues to demonstrate the following deficits muscle weakness and muscle paralysis, decreased cardiorespiratoy endurance, impaired timing and sequencing, abnormal tone, unbalanced muscle activation, decreased coordination, and decreased motor planning, decreased midline orientation and decreased attention to left, decreased initiation, decreased attention, decreased awareness, decreased problem solving, decreased safety awareness, decreased memory, and delayed processing, and decreased sitting balance, decreased standing balance, decreased  postural control, hemiplegia, and decreased balance strategies and therefore will continue to benefit from skilled PT intervention to increase functional independence with mobility.  Patient progressing toward long term goals.  Continue plan of care.  PT Short Term Goals Week 1:  PT Short Term Goal 1 (Week 1): Pt will perform STS with Max A +1 to LRAD. PT Short Term Goal 1 - Progress (Week 1): Progressing toward goal PT Short Term Goal 2 (Week 1): Pt will perform supine <> sit with Mod/ Max A. PT Short Term Goal 2 - Progress (Week 1): Progressing toward goal PT Short Term Goal 3 (Week 1): Pt will perform functional bed <> w/c transfers with Max A +1. PT Short Term Goal 3 - Progress (Week 1): Progressing toward goal PT Short Term Goal 4 (Week 1): Pt will tolerate standing for > 218m. PT Short Term Goal 4 - Progress (Week 1): Met PT Short Term Goal 5 (Week 1): Pt will initiate gait training. PT Short Term Goal 5 - Progress (Week 1): Met Week 2:  PT Short Term Goal 1 (Week 2): Pt will perform supine<>sit with max assist of 1 consistently PT Short Term Goal 2 (Week 2): Pt will perform bed<>chair transfers with max assist of 1 consistently PT Short Term Goal 3 (Week 2): Pt will perform sit<>stands with max assist of 1 PT Short Term Goal 4 (Week 2): Pt will participate in body weight supported high intensity gait training reaching [id number] Therapeutic Interventions/Progress Updates:  Ambulation/gait training;Balance/vestibular training;Cognitive remediation/compensation;Community reintegration;Discharge planning;DME/adaptive equipment instruction;Functional mobility training;Disease management/prevention;Neuromuscular re-education;Pain management;Patient/family education;Psychosocial support;Splinting/orthotics;Stair training;Therapeutic Activities;Therapeutic Exercise;UE/LE Strength taining/ROM;UE/LE Coordination activities;Visual/perceptual remediation/compensation;Wheelchair  propulsion/positioning;Skin care/wound management;Functional electrical stimulation   Pt received supine in bed reporting she is confused and disoriented stating "I can't move and I don't know what's wrong with me." Therapist reoriented patient to situation and location with pt repeatedly stating  she had a stroke throughout the session as a way for pt to process her situation. Donned TED hoes and shoes total assist for time management. Supine rolling L using bedrail with min assist and rolling R with total assist for L hemibody management and rotation. Donned litegait harness in supine for improved positioning and fit. Supine>sitting R EOB, HOB flat but using bedrail with total assist of 1 for L hemibody management and trunk upright - pt demos improved motor planning and sequencing to use R UE to assist with trunk support - continues to have R posterior lean upon coming to sit requiring heavy mod assist for trunk control until pt orients to midline then progresses to CGA. L squat pivot EOB>w/c with total assist for lifting/pivoting hips - cuing for anterior trunk lean and head/hips relationship - therapist facilitating L LE WBing and total assist positioning L LE during transfer.  Transported to/from gym in w/c for time management and energy conservation.  Sit>stand w/c>R UE support on litegait with total assist of 1 for lifting to stand and blocking L knee buckle with pt demonstrating increased L LE hip/knee extensor activation compared to previous session with this therapist - continues to demo heavy posterior lean once in standing requiring total assist to keep balance. +2 assist attached litegait harness to body weight support (BWS) system. Donned L LE ankle DF assist ACE wrap. Supported L UE during gait training. Overground gait training in litegait with moderate amount of BWS ~39f 2x with +2 assist for litegait management and max assist for L LE  gait mechanics  - attached block bungee cord to L hip on  first gait to promote increased hip extension and anterior pelvic translation of L stance phase; attached bilateral bungee cords during 2nd walk with improved upright posture - provided additional BWS during 2nd walk with improved L LE stance control with increased hip/knee extension and upright posture (otherwise required heavy total assist to facilitate) - pt able to advance L LE ~25% of the way during swing with increased adduction noted with max assist to promote increased step length and improved BOS   Doffed litegait harness and transported pt back to room. Pt remained seated in w/c with needs in reach, seat belt alarm on, sign on table notifying her of upcoming meal to help with orientation, and NT aware of pt's position.   Therapy Documentation Precautions:  Precautions Precautions: Fall Precaution Comments: L hemiparesis, R gaze preference Restrictions Weight Bearing Restrictions: No  Pain: No specific complaints of pain during session, primarily reports fatigue.   Therapy/Group: Individual Therapy  CTawana Scale, PT, DPT, NCS, CSRS 06/12/2021, 3:27 PM

## 2021-06-12 NOTE — Progress Notes (Signed)
Occupational Therapy Session Note  Patient Details  Name: Vickie Mckee MRN: [id number] Date of Birth: [birth year]  Today's Date: 06/12/2021 OT Individual Time: 1004-1105 OT Individual Time Calculation (min): 61 min    Short Term Goals: Week 2:  OT Short Term Goal 1 (Week 2): Pt will complete UB bathing sitting supported in wheelchair with min assist for 2 consecutive sessions. OT Short Term Goal 2 (Week 2): Pt will complete UB dressing with mod assist for donning a pullover shirt following hemi dressing techniques. OT Short Term Goal 3 (Week 2): Pt will complete squat pivot transfer to the drop arm commode with max assist.  Skilled Therapeutic Interventions/Progress Updates:    Pt completed bathing and dressing supine to sit during session.  Max assist in supine for rolling with total assist for washing buttocks.  She was able to wash her front peri area in supine with HOB elevated with setup.  Max assist was then needed for transition to sitting from sidelying.  Worked on bathing and dressing from EOB.  Decreased ability to maintain anterior pelvic tilt upright posture with mod assist for overall dynamic sitting balance.  She needed mod assist for UB bathing with mod assist for washing her legs secondary to already washing her peri area in supine.  Max assist was needed for donning pullover shirt with total +2 (pt 25%) for LB dressing sit to stand.  Increased trunk and cervical flexion noted in standing with right lean.  She completed squat pivot transfer to the wheelchair at max assist in order to complete grooming tasks at the sink.  Supervision for oral hygiene and for combing her hair.  Pt with increased fatigue throughout session stating she was sleepy and wanted to lay down.  Easily re-directed however back to task.  She was left with her daughters in room and safety belt in place.      Therapy Documentation Precautions:  Precautions Precautions: Fall Precaution Comments: L hemiparesis,  R gaze preference Restrictions Weight Bearing Restrictions: No  Pain: Pain Assessment Pain Scale: Faces Pain Score: 0-No pain ADL: See Care Tool Section for some details of mobility and selfcare   Therapy/Group: Individual Therapy  Jesson Foskey OTR/L 06/12/2021, 12:37 PM

## 2021-06-12 NOTE — Progress Notes (Signed)
Speech Language Pathology Daily Session Note  Patient Details  Name: Vickie Mckee MRN: [id number] Date of Birth: 1930/12/27  Today's Date: 06/12/2021 SLP Individual Time: 1345-1430 SLP Individual Time Calculation (min): 45 min  Short Term Goals: Week 2: SLP Short Term Goal 1 (Week 2): Patient will demonstrate orientation x4 with sup A verbal cues to refer to compensatory aids SLP Short Term Goal 2 (Week 2): Patient will demonstrate basic problem solving for basic tasks with sup-to-min A verbal cues SLP Short Term Goal 3 (Week 2): Patient will achieve 75-90% intelligibility with sup A verbal cues to increase vocal intensity at sentence level to more effectively communicate functional needs SLP Short Term Goal 4 (Week 2): Patient will tolerate current diet with minimal-to-no overt s/sx of aspiration with sup A verbal cues for use of swallowing compensatory strategies SLP Short Term Goal 5 (Week 2): Patient will demonstrate effective mastication and oral clearance of Dys 2 trials with no overt s/sx of aspiration over at least 2 observed sessions to demonstrate readiness for diet advancement. SLP Short Term Goal 6 (Week 2): Patient will sustain attention for 8 minute intervals with min A verbal redirection  Skilled Therapeutic Interventions: Patient agreeable to skilled ST intervention with focus on cognitive goals. Patient was received awake and sitting upright in bed while conversing with her two daughters. Patient remained awake/alert for duration of session and appeared more engaged with therapist as compared to previous encounters. Although still not fully and consistently oriented, patient exhibited improved awareness of situation and required less verbal redirection for orientation. She required sup A verbal cues to refer to orientation visuals across from her bed to successfully locate month/year/location and referred to these often during session. In fact, perseverated on this at times. SLP  facilitated session by providing sup A verbal cues for basic problem solving/reasoning, and sustained attention. She eventually required min-to-mod A verbal cues primarily for sustained attention and working memory. She sustained attention at sup A level for approximately 5 minutes, and eventually min-to-mod A needed for remainder of activity (10 minutes). Patient was left in bed with alarm activated and immediate needs within reach at end of session. Nurse was present at bedside. Continue per current plan of care.      Pain Pain Assessment Pain Scale: 0-10 Pain Score: 0-No pain  Therapy/Group: Individual Therapy  Patty Sermons 06/12/2021, 3:47 PM

## 2021-06-12 NOTE — Progress Notes (Signed)
Occupational Therapy Session Note  Patient Details  Name: Vickie Mckee MRN: [id number] Date of Birth: [birth year]  Today's Date: 06/12/2021 OT Individual Time: 1130-1157 OT Individual Time Calculation (min): 27 min    Short Term Goals: Week 2:  OT Short Term Goal 1 (Week 2): Pt will complete UB bathing sitting supported in wheelchair with min assist for 2 consecutive sessions. OT Short Term Goal 2 (Week 2): Pt will complete UB dressing with mod assist for donning a pullover shirt following hemi dressing techniques. OT Short Term Goal 3 (Week 2): Pt will complete squat pivot transfer to the drop arm commode with max assist.  Skilled Therapeutic Interventions/Progress Updates:    Pt resting in w/c upon arrival with daughters present. Daughters left room during therapy. OT intervention with focus on increased attention to Lt, memory, LUE NMR, sit<>stand, and standing balance. Pt required max verbal cues throughout session to attend to Lt. Pt recalled her birth year but could not remember her age when questioned at various times throughout session. Pt could not recall earlier OT therapy session even with max verbal cues. Sit<>stand from w/c with max A+2 x 3. PT required mod A for scooting in w/c. Pt's conversation somewhat tangential throughout session. Pt remained in w/c with all needs within reach and belt alarm activated. Daughters entered room at end of session and remained with pt.  Therapy Documentation Precautions:  Precautions Precautions: Fall Precaution Comments: L hemiparesis, R gaze preference Restrictions Weight Bearing Restrictions: No  Pain:  Pt denies pain this morning   Therapy/Group: Individual Therapy  [name] 06/12/2021, 12:13 PM

## 2021-06-12 NOTE — Progress Notes (Signed)
PROGRESS NOTE   Subjective/Complaints: More alert today  Pt without issues this am eating well  ROS: Denies CP, SOB, N/V/D  Objective:   No results found. No results for input(s): WBC, HGB, HCT, PLT in the last 72 hours.  No results for input(s): NA, K, CL, CO2, GLUCOSE, BUN, CREATININE, CALCIUM in the last 72 hours.   Intake/Output Summary (Last 24 hours) at 06/12/2021 0733 Last data filed at 06/11/2021 1800 Gross per 24 hour  Intake 580 ml  Output --  Net 580 ml         Physical Exam: Vital Signs Blood pressure (!) 123/54, pulse 68, temperature 98 F (36.7 C), temperature source Oral, resp. rate 16, height '5\' 2"'$  (1.575 m), weight 53.5 kg, SpO2 95 %.  General: No acute distress Mood and affect are appropriate Heart: Regular rate and rhythm no rubs murmurs or extra sounds Lungs: Clear to auscultation, breathing unlabored, no rales or wheezes Abdomen: Positive bowel sounds, soft nontender to palpation, nondistended Extremities: No clubbing, cyanosis, or edema Skin: No evidence of breakdown, no evidence of rash  Motor: LUE: 0/5 proximal distal, except trace Left triceps  LLE: Hip flexion, knee extension 2-/5, ankle dorsiflexion 0/5 Absent Triple flexor response LLE  Sensation intact LT on left side and right  Dysarthria  Assessment/Plan: 1. Functional deficits which require 3+ hours per day of interdisciplinary therapy in a comprehensive inpatient rehab setting. Physiatrist is providing close team supervision and 24 hour management of active medical problems listed below. Physiatrist and rehab team continue to assess barriers to discharge/monitor patient progress toward functional and medical goals  Care Tool:  Bathing    Body parts bathed by patient: Left arm, Right upper leg, Left upper leg, Abdomen, Chest, Face, Front perineal area   Body parts bathed by helper: Buttocks, Right lower leg, Left lower leg,  Right arm     Bathing assist Assist Level: 2 Helpers     Upper Body Dressing/Undressing Upper body dressing   What is the patient wearing?: Pull over shirt    Upper body assist Assist Level: Maximal Assistance - Patient 25 - 49%    Lower Body Dressing/Undressing Lower body dressing      What is the patient wearing?: Incontinence brief, Pants     Lower body assist Assist for lower body dressing: 2 Helpers     Toileting Toileting    Toileting assist Assist for toileting: 2 Helpers     Transfers Chair/bed transfer  Transfers assist     Chair/bed transfer assist level: Total Assistance - Patient < 25% (squat pivot)     Locomotion Ambulation   Ambulation assist   Ambulation activity did not occur: Safety/medical concerns  Assist level: 2 helpers Assistive device: Lite Gait Max distance: 73f   Walk 10 feet activity   Assist  Walk 10 feet activity did not occur: Safety/medical concerns        Walk 50 feet activity   Assist Walk 50 feet with 2 turns activity did not occur: Safety/medical concerns         Walk 150 feet activity   Assist Walk 150 feet activity did not occur: Safety/medical concerns  Walk 10 feet on uneven surface  activity   Assist Walk 10 feet on uneven surfaces activity did not occur: Safety/medical concerns         Wheelchair     Assist Is the patient using a wheelchair?: Yes Type of Wheelchair: Manual Wheelchair activity did not occur: Safety/medical concerns         Wheelchair 50 feet with 2 turns activity    Assist    Wheelchair 50 feet with 2 turns activity did not occur: Safety/medical concerns       Wheelchair 150 feet activity     Assist  Wheelchair 150 feet activity did not occur: Safety/medical concerns       Blood pressure (!) 123/54, pulse 68, temperature 98 F (36.7 C), temperature source Oral, resp. rate 16, height '5\' 2"'$  (1.575 m), weight 53.5 kg, SpO2 95 %.       Medical Problem List and Plan: 1.   Left-sided hemiparesis with dysarthria/dysphagia secondary to right pontine infarction as well as multiple small remote bilateral cerebellar lacunar infarcts Continue CIR  Cont PRAFO, WHO nightly 2.  Impaired mobility: Continue Lovenox for DVT prophylaxis             -antiplatelet therapy: Aspirin 81 mg daily and Plavix 75 mg day x1 month then Plavix alone Flexor withdrawal spasms LLE-zanaflex at night but may be causing am fatigue will d/c 3. Pain Management/chronic back pain: Tylenol as needed 4. Depression: Continue Zoloft 100 mg daily, Aricept 5 mg nightly             -antipsychotic agents: N/A 5. Neuropsych: This patient is capable of making decisions on her own behalf. 6. Skin/Wound Care: Routine skin checks 7. Fluids/Electrolytes/Nutrition: Routine in and outs 8. Post stroke dysphagia.  Dysphagia #1 nectar thick liquids.  Follow-up speech therapy.  Monitor hydration   Advance diet as tolerated 9.  Hypertension.  Patient on verapamil 240 mg daily prior to admission as well as HCTZ 25 mg daily.     Vitals:   06/11/21 1955 06/12/21 0439  BP: (!) 124/50 (!) 123/54  Pulse: 81 68  Resp: 20 16  Temp: 97.6 F (36.4 C) 98 F (36.7 C)  SpO2: 95% 95%   Good control 8/22 off BP meds  10.  Hyperlipidemia.  Lipitor 11.  Hypothyroidism.  TSH 2.093 .Continue Synthroid 12.  Constipation.  MiraLAX scheduled daily.  Senokot S2 tablets nightly  13. Hypokalemia: Supplementation started   KCL ordered 8/16 14. Crackles and coughing with food intake: placed nursing order for must be up with meals, resolved   Chest x-ray unremarkable for acute process- per SLP fluids downgraded to Honey thick with repeat MBS this wk? 15.  Hypoalbuminemia  Supplement initiated on 8/13  LOS: 11 days A FACE TO [place] E Anyelin Mogle 06/12/2021, 7:33 AM

## 2021-06-13 NOTE — Progress Notes (Signed)
Speech Language Pathology Daily Session Note  Patient Details  Name: Vickie Mckee MRN: [id number] Date of Birth: 04/03/1931  Today's Date: 06/13/2021 SLP Individual Time: 1300-1400 SLP Individual Time Calculation (min): 60 min  Short Term Goals: Week 2: SLP Short Term Goal 1 (Week 2): Patient will demonstrate orientation x4 with sup A verbal cues to refer to compensatory aids SLP Short Term Goal 2 (Week 2): Patient will demonstrate basic problem solving for basic tasks with sup-to-min A verbal cues SLP Short Term Goal 3 (Week 2): Patient will achieve 75-90% intelligibility with sup A verbal cues to increase vocal intensity at sentence level to more effectively communicate functional needs SLP Short Term Goal 4 (Week 2): Patient will tolerate current diet with minimal-to-no overt s/sx of aspiration with sup A verbal cues for use of swallowing compensatory strategies SLP Short Term Goal 5 (Week 2): Patient will demonstrate effective mastication and oral clearance of Dys 2 trials with no overt s/sx of aspiration over at least 2 observed sessions to demonstrate readiness for diet advancement. SLP Short Term Goal 6 (Week 2): Patient will sustain attention for 8 minute intervals with min A verbal redirection  Skilled Therapeutic Interventions: Patient agreeable to skilled ST intervention with focus on cognitive and swallow goals. Patient's daughters were present at start of session, left, and returned at end of session. SLP facilitated session by providing sup A verbal cues for sustained attention during cognitive and swallowing tasks. Patient frequently complained of feeling tired however easy to re-direct. She was oriented to person, place, and situation, and continues to benefit from sup A verbal cues to refer to orientation visuals in room to orient to month/year. She exhibited limited retention of information following >1 minute delay and at times would immediately ask the same question or require  verbal repetition. SLP facilitated trials of Dys 2 textures in which patient exhibited improved mastication effectiveness and efficiency. She reported "I feel so tired to chew." Patient exhibited immediate cough with Dys 2 textures which eventually cleared with time and cues for honey thick liquid rinse. Facilitated oral care with sup A verbal cues for thoroughness. After ~15 minute delay to ensure adequate clearance, SLP facilitated trials of single ice chips in which patient consumed without overt s/sx of aspiration. Patient appeared pleased and expressed the ice chips were refreshing. Recommend initiation of 1-2 small ice chips at a time intermittently with supervision for pleasure AFTER oral care. Patient's daughters were educated and verablized understanding with teach back. Lastly, patient completed 40 chin tucks against resistance (CTAR) using towel involving 4 sets of 10, isometric hold for 5 second duration with sup A verbal cues for effectiveness and reminders not to hold breath. Patient tolerated well and denied discomfort. Patient was left in bed with alarm activated and immediate needs within reach at end of session. Continue per current plan of care.      Pain Pain Assessment Pain Scale: Faces Pain Score: 0-No pain  Therapy/Group: Individual Therapy  Patty Sermons 06/13/2021, 2:36 PM

## 2021-06-13 NOTE — Progress Notes (Signed)
Physical Therapy Session Note  Patient Details  Name: Vickie Mckee MRN: [id number] Date of Birth: 1931/07/03  Today's Date: 06/13/2021 PT Individual Time: 0900-0930 PT Individual Time Calculation (min): 30 min   Short Term Goals: Week 2:  PT Short Term Goal 1 (Week 2): Pt will perform supine<>sit with max assist of 1 consistently PT Short Term Goal 2 (Week 2): Pt will perform bed<>chair transfers with max assist of 1 consistently PT Short Term Goal 3 (Week 2): Pt will perform sit<>stands with max assist of 1 PT Short Term Goal 4 (Week 2): Pt will participate in body weight supported high intensity gait training reaching 17f  Skilled Therapeutic Interventions/Progress Updates:     Pt supine in bed to start session. Daughters are not at bedside. She's agreeable to therapy and denies pain but reports being "sleepy." Donned knee-high TED's and pants at bed level with totalA +2 for time. Requires minA for rolling L and totalA for rolling R. Noted improved RLE strength while donning pants with ability to actively engage hip/knee flexors for heel slides (!). She completed supine there-ex for RLE including heel slides, quad sets (!), and hip abduction (AAROM provided for full ROM but significant improvement in strength/attending). Donned shoes at bed level with totalA and assisted to EOB with maxA of 1 person via log rolling technique. Pt reporting urge to toilet. Used Stedy for time/urgency and assisted in [place] +[place] maxA for standing. Able to sit in perched position with CGA and Stedy transfer to toilet. Pt required +2 totalA for managing lower body dressing and brief and for controlled lowering to toilet. Pt continent of bowel and bladder, charted in flowsheets. Required +2 totalA for posterior pericare and assist in standing in [place] Stedy transfer to her w/c in similar manner as above. Remained seated in w/c and reclined slightly for comfort and safety. Daughter's arriving and updated on pt's  mobility. She remained seated in w/c with safety belt alarm in place and all needs in reach.   Therapy Documentation Precautions:  Precautions Precautions: Fall Precaution Comments: L hemiparesis, R gaze preference Restrictions Weight Bearing Restrictions: No General:    Therapy/Group: Individual Therapy  CAlger Simons8/23/2022, 7:40 AM

## 2021-06-13 NOTE — Progress Notes (Signed)
PROGRESS NOTE   Subjective/Complaints: No issues overnite , pt asks "did I have a stroke" We discussed this at admission, reminded pt  ROS: Denies CP, SOB, N/V/D  Objective:   No results found. No results for input(s): WBC, HGB, HCT, PLT in the last 72 hours.  No results for input(s): NA, K, CL, CO2, GLUCOSE, BUN, CREATININE, CALCIUM in the last 72 hours.   Intake/Output Summary (Last 24 hours) at 06/13/2021 0736 Last data filed at 06/12/2021 1700 Gross per 24 hour  Intake 236 ml  Output --  Net 236 ml         Physical Exam: Vital Signs Blood pressure (!) 132/57, pulse 72, temperature (!) 97.4 F (36.3 C), temperature source Oral, resp. rate 18, height '5\' 2"'$  (1.575 m), weight 53.5 kg, SpO2 98 %.   General: No acute distress Mood and affect are appropriate Heart: Regular rate and rhythm no rubs murmurs or extra sounds Lungs: Clear to auscultation, breathing unlabored, no rales or wheezes Abdomen: Positive bowel sounds, soft nontender to palpation, nondistended Extremities: No clubbing, cyanosis, or edema Skin: No evidence of breakdown, no evidence of rash  Motor: LUE: 0/5 proximal distal, except trace Left triceps  LLE: Hip flexion, knee extension 2-/5, ankle dorsiflexion 0/5 Absent Triple flexor response LLE  Sensation intact LT on left side and right  Dysarthria  Assessment/Plan: 1. Functional deficits which require 3+ hours per day of interdisciplinary therapy in a comprehensive inpatient rehab setting. Physiatrist is providing close team supervision and 24 hour management of active medical problems listed below. Physiatrist and rehab team continue to assess barriers to discharge/monitor patient progress toward functional and medical goals  Care Tool:  Bathing    Body parts bathed by patient: Left arm, Right upper leg, Left upper leg, Abdomen, Chest, Face, Front perineal area   Body parts bathed by  helper: Buttocks, Right arm, Left lower leg     Bathing assist Assist Level: Maximal Assistance - Patient 24 - 49%     Upper Body Dressing/Undressing Upper body dressing   What is the patient wearing?: Pull over shirt    Upper body assist Assist Level: Supervision/Verbal cueing    Lower Body Dressing/Undressing Lower body dressing      What is the patient wearing?: Incontinence brief, Pants     Lower body assist Assist for lower body dressing: 2 Helpers     Toileting Toileting    Toileting assist Assist for toileting: 2 Helpers     Transfers Chair/bed transfer  Transfers assist     Chair/bed transfer assist level: Total Assistance - Patient < 25% (squat pivot)     Locomotion Ambulation   Ambulation assist   Ambulation activity did not occur: Safety/medical concerns  Assist level: Total Assistance - Patient < 25% Assistive device: Lite Gait Max distance: 57f   Walk 10 feet activity   Assist  Walk 10 feet activity did not occur: Safety/medical concerns        Walk 50 feet activity   Assist Walk 50 feet with 2 turns activity did not occur: Safety/medical concerns         Walk 150 feet activity   Assist Walk 150  feet activity did not occur: Safety/medical concerns         Walk 10 feet on uneven surface  activity   Assist Walk 10 feet on uneven surfaces activity did not occur: Safety/medical concerns         Wheelchair     Assist Is the patient using a wheelchair?: Yes Type of Wheelchair: Manual Wheelchair activity did not occur: Safety/medical concerns         Wheelchair 50 feet with 2 turns activity    Assist    Wheelchair 50 feet with 2 turns activity did not occur: Safety/medical concerns       Wheelchair 150 feet activity     Assist  Wheelchair 150 feet activity did not occur: Safety/medical concerns       Blood pressure (!) 132/57, pulse 72, temperature (!) 97.4 F (36.3 C), temperature source  Oral, resp. rate 18, height '5\' 2"'$  (1.575 m), weight 53.5 kg, SpO2 98 %.      Medical Problem List and Plan: 1.   Left-sided hemiparesis with dysarthria/dysphagia secondary to right pontine infarction as well as multiple small remote bilateral cerebellar lacunar infarcts Continue CIR  Cont PRAFO, WHO nightly- team conf in am  2.  Impaired mobility: Continue Lovenox for DVT prophylaxis             -antiplatelet therapy: Aspirin 81 mg daily and Plavix 75 mg day x1 month then Plavix alone Flexor withdrawal spasms LLE-zanaflex at night but may be causing am fatigue will d/c 3. Pain Management/chronic back pain: Tylenol as needed 4. Depression: Continue Zoloft 100 mg daily, Aricept 5 mg nightly             -antipsychotic agents: N/A 5. Neuropsych: This patient is capable of making decisions on her own behalf. 6. Skin/Wound Care: Routine skin checks 7. Fluids/Electrolytes/Nutrition: Routine in and outs 8. Post stroke dysphagia.  Dysphagia #1 nectar thick liquids.  Follow-up speech therapy.  Monitor hydration   Advance diet as tolerated 9.  Hypertension.  Patient on verapamil 240 mg daily prior to admission as well as HCTZ 25 mg daily.     Vitals:   06/12/21 2025 06/13/21 0514  BP: 130/60 (!) 132/57  Pulse: 81 72  Resp: 19 18  Temp: (!) 97.5 F (36.4 C) (!) 97.4 F (36.3 C)  SpO2: 95% 98%   Good control 8/23 off BP meds  10.  Hyperlipidemia.  Lipitor 11.  Hypothyroidism.  TSH 2.093 .Continue Synthroid 12.  Constipation.  MiraLAX scheduled daily.  Senokot S2 tablets nightly  13. Hypokalemia: Supplementation started   KCL ordered 8/16 14. Crackles and coughing with food intake: placed nursing order for must be up with meals, resolved   Chest x-ray unremarkable for acute process- per SLP fluids downgraded to Honey thick with repeat MBS this wk? 15.  Hypoalbuminemia  Supplement initiated on 8/13 16.  Cognitive deficits post stroke - has evidence of small vessel disease on imaging as well  as the new pontine infarct  LOS: 12 days A FACE TO [place] E Billiejean Schimek 06/13/2021, 7:36 AM

## 2021-06-13 NOTE — Progress Notes (Signed)
Physical Therapy Session Note  Patient Details  Name: Vickie Mckee MRN: [id number] Date of Birth: 1930-12-11  Today's Date: 06/13/2021 PT Individual Time: [id number] PT Individual Time Calculation (min): 76 min   Short Term Goals: Week 2:  PT Short Term Goal 1 (Week 2): Pt will perform supine<>sit with max assist of 1 consistently PT Short Term Goal 2 (Week 2): Pt will perform bed<>chair transfers with max assist of 1 consistently PT Short Term Goal 3 (Week 2): Pt will perform sit<>stands with max assist of 1 PT Short Term Goal 4 (Week 2): Pt will participate in body weight supported high intensity gait training reaching 63f  Skilled Therapeutic Interventions/Progress Updates:    Pt received supine in bed and continues to be disoriented to situation and location stating "I don't know where I am" or "I don't know what's happened to me." Therapist reoriented patient throughout session with her continuing to repeat statements confirming that she is processing the fact that she has had a stroke. Pt agreeable to therapy session. Rolling L with min assist using bedrail and cuing for placing R LE into hooklying to assist with rolling. Rolling R with total assist for L hemibody management and trunk/pelvic rotation. Donned litegait harness in supine. Supine>sitting R EOB using bedrail, with total assist for B LE management and trunk upright - continued cuing for use of R UE to assist with trunk upright. Sitting EOB with +2 assist providing support for trunk control while therapist donned shoes total assist. L squat pivot EOB>w/c with max assist of 1 for lifting/pivoting hips - repeated cuing for R anterior trunk lean to assist with hip clearance during transfer.   Donned L LE ankle DF assist ACE wrap. Provided L UE support with ACE wrap. Sit>stand w/c>R UE support on litegait with +2 heavy max assist to lift into standing and maintain balance once up due to strong posterior lean while 3rd person assist  with connecting harness to litegait for increased efficiency.   Gait training overground [id number] (seated break between each) while in litegait for partial body weight support (BWS) with max assist of 1 and +2 assist to manage litegait. Donned bilateral black bungee cords at hips to promote increased hip extension and forward translation of pelvis during gait. Requires mod assist for L LE advancement during swing (pt able to advance ~50% through) and max assist to facilitate L hip/knee extension during stance - cuing throughout for upright posture and increased R LE step length to promote increased L LE stance time and weight shift. Doffed harness as described above.  Transported back to room and left sitting in w/c with NT present to assist with toileting.  Therapy Documentation Precautions:  Precautions Precautions: Fall Precaution Comments: L hemiparesis Restrictions Weight Bearing Restrictions: No   Pain: Reports some general pain/discomfort in L UE but unable to elaborate - provided support for UE during standing/ambulating tasks for pain management.   Therapy/Group: Individual Therapy  CarTawana ScalePT, DPT, NCS, CSRS 06/13/2021, 3:20 PM

## 2021-06-13 NOTE — Progress Notes (Signed)
Occupational Therapy Session Note  Patient Details  Name: Vickie Mckee MRN: [id number] Date of Birth: 06/23/1931  Today's Date: 06/13/2021 OT Individual Time: [id number] OT Individual Time Calculation (min): 61 min    Short Term Goals: Week 2:  OT Short Term Goal 1 (Week 2): Pt will complete UB bathing sitting supported in wheelchair with min assist for 2 consecutive sessions. OT Short Term Goal 2 (Week 2): Pt will complete UB dressing with mod assist for donning a pullover shirt following hemi dressing techniques. OT Short Term Goal 3 (Week 2): Pt will complete squat pivot transfer to the drop arm commode with max assist.  Skilled Therapeutic Interventions/Progress Updates:    Pt in wheelchair to start session with family present.  Took her down to the therapy gym with max assist squat pivot transfer to the therapy mat.  Worked on upright trunk and cervical alignment in sitting with mirror for feedback.  Max demonstrational cueing for upright posture while working on active movement with the LUE.  She demonstrates trace shoulder flexion and extension.  Used tilted stool for activation of the LUE to target with max facilitation to maintain upright midline orientation and posture.  Progressed to working on standing with use of the mirror for feedback.  Total +2 (pt 25%) to complete sit to stand X2 and maintain static standing.  Increased trunk and cervical flexion noted with decreased left knee and hip extension.  Unable to achieve and maintain upright posture to midline.  Reports increased left arm pain when positioned on therapist's shoulder in this position secondary to increased trunk flexion.  Finished session with squat pivot transfers to the therapy mat and to the bed from the wheelchair at max assist.  Pt continues to report increased fatigue and needing to lay down at times during session secondary to overall limited tolerance and endurance.  Max assist for sit to supine with call button and  phone in reach and safety alarm in place.  PRAFO placed on the LLE as well.    Therapy Documentation Precautions:  Precautions Precautions: Fall Precaution Comments: L hemiparesis Restrictions Weight Bearing Restrictions: No  Pain: Pain Assessment Pain Scale: Faces Pain Score: 0-No pain ADL: See Care Tool Section for some details of mobility and selfcare   Therapy/Group: Individual Therapy  Geron Mulford OTR/L 06/13/2021, 12:44 PM

## 2021-06-14 NOTE — Patient Care Conference (Signed)
Inpatient RehabilitationTeam Conference and Plan of Care Update Date: 06/14/2021   Time: 11:02 AM    Patient Name: Vickie Mckee      Medical Record Number: [id number]  Date of Birth: Mar 04, 1931 Sex: Female         Room/Bed: 4M06C/4M06C-01 Payor Info: Payor: MEDICARE / Plan: MEDICARE PART A AND B / Product Type: *No Product type* /    Admit Date/Time:  06/01/2021  1:51 PM  Primary Diagnosis:  Left hemiplegia [facility])  Hospital Problems: Principal Problem:   Left hemiplegia ([place]) Active Problems:   Right pontine cerebrovascular accident ([place])   Hypoalbuminemia due to protein-calorie malnutrition ([place])   Hypokalemia   Dysphagia, post-stroke    Expected Discharge Date: Expected Discharge Date: 06/30/21  Team Members Present: Physician leading conference: Dr. Leeroy Cha Social Worker Present: Loralee Pacas, [place] Nurse Present: Dorien Chihuahua, RN PT Present: Page Spiro, PT OT Present: Clyda Greener, OT SLP Present: Sherren Kerns, SLP PPS Coordinator present : Gunnar Fusi, SLP     Current Status/Progress Goal Weekly Team Focus  Bowel/Bladder             Swallow/Nutrition/ Hydration   Dys 1 textures, honey thick - sup A  sup A  MBS scheduled Thurs, swallowing exercises, tolerance of ice chips, nectar thick trials, Dys 2 trials   ADL's   Max assistMax assist for UB selfcare with total +2 for LB selfcare. Max assist for squat pivot transfers to the wheelchair and bed.Still with severe LLE and LUE hemiparesis. Brunnstrum stage II in the left arm and hand. Decreased endurance  min to mod assist  selfcare retraining, transfer training, balance retraining, DME education, therapeutic exercises, splinting, neuromuscular re-education   Mobility   rolling L min assist using bedrail, rolling R total assist, supine>sitting with total assist, max/total assist squat pivot, +2 total assist sit<>stand, high intensity gait training overground with partial body weight support up to  171f (totaling 4547fin a session) demonstrating improving activity tolerance - continues to demonstrate impaired cognition with L inattention  min assist overall - will monitor if need to downgrade  activity tolerance, L LE NMR, bed mobility training, transfer training, standing tolerance, high intensity gait training, pt/family education, trunk control, L attention   Communication   sup A - improving vocal intensity secondary to improved alertness  sup A  breath support - using incentive spirometer for practice prior to RMT assessment   Safety/Cognition/ Behavioral Observations  min A for basic problem solving, mod-to-max A for sustained attention and recall.  min A  orientation using visual supports, sustained attention, basic problem solving, working memory   Pain             Skin               Discharge Planning:  Pt to d/c to home with her daughters who will provide Min A.   Team Discussion: Left side weakness and cognitive issues with new pontine infarct. Patient is very lethargic, sleeps a lot but reports fatigue. Patient participates in therapy, is easily redirected and has fair carryover but alertness has improved along with tolerance. Progress impaired by poor endurance. Cognition fluctuates; patient repeatedly asks the same questions over and over and does better with structured tasks.  Patient on target to meet rehab goals: Currently requires min assist for  upper body bathing  and max assist for upper body dressing. Requires total assist  +2 for lower body bathing and dressing. Requires max assist to roll to  the right and min - mod assist to roll to the left. Sitting balance is min assist and increases with fatigue, cannot sit upright without leaning to the right. Squat pivot transfers with max assist.   *See Care Plan and progress notes for long and short-term goals.   Revisions to Treatment Plan:  Trial D2 diet; currently on D1 Honey thick liquids due to coughing  MBS  scheduled for 06/15/21  Teaching Needs: Safety, medications, secondary risk management, transfers and toileting  Current Barriers to Discharge: Decreased caregiver support and Home enviroment access/layout  Possible Resolutions to Barriers: Family education with daughters     Medical Summary Current Status: diastolic BP soft, left sided weakness, cognitive deficts with new pontinue infarct, CXR stable but has crackles and coughing, constipation, insomnia, hypothyroidism, hypokalemia  Barriers to Discharge: Medical stability  Barriers to Discharge Comments: diastolic BP soft, left sided weakness, cognitive deficts with new pontinue infarct, CXR stable but has crackles and coughing, constipation, insomnia, hypothyroidism, hypokalemia Possible Resolutions to [employer]: continue to monitor BP TID, MBS tomorrow, continue senna-docusate, continue melatonin, continue synthroid, continue potassium supplementation   Continued Need for Acute Rehabilitation Level of Care: The patient requires daily medical management by a physician with specialized training in physical medicine and rehabilitation for the following reasons: Direction of a multidisciplinary physical rehabilitation program to maximize functional independence : Yes Medical management of patient stability for increased activity during participation in an intensive rehabilitation regime.: Yes Analysis of laboratory values and/or radiology reports with any subsequent need for medication adjustment and/or medical intervention. : Yes   I attest that I was present, lead the team conference, and concur with the assessment and plan of the team.   Dorien Chihuahua B 06/14/2021, 2:53 PM

## 2021-06-14 NOTE — Progress Notes (Signed)
Nutrition Follow-up  DOCUMENTATION CODES:   Not applicable  INTERVENTION:  Continue Ensure Enlive po BID (thickened to appropriate consistency), each supplement provides 350 kcal and 20 grams of protein   Continue 30 ml Prosource plus po BID, each supplement provides 100 kcal and 15 grams of protein.    Encourage adequate PO intake.   NUTRITION DIAGNOSIS:   Increased nutrient needs related to  (therapy) as evidenced by estimated needs; ongoing  GOAL:   Patient will meet greater than or equal to 90% of their needs; met  MONITOR:   PO intake, Supplement acceptance, Skin, Weight trends, Labs, I & O's, Diet advancement  REASON FOR ASSESSMENT:   Malnutrition Screening Tool    ASSESSMENT:   87 year old female with history of hypertension hypothyroidism GERD chronic low back pain, hypertrophic obstructive cardiomyopathy. Presents with acute onset of left-sided weakness and dysarthria as well as left lateral gaze. MRI showed acute right pontine infarction. Multiple small remote bilateral cerebellar lacunar infarcts. Therapy evaluations completed due to patient's left-sided weakness dysarthria/dysphagia was admitted for a comprehensive rehab program.  Meal completion has been 55-100%. Pt is currently on a dysphagia 1 diet with honey thick liquids. Pt tolerating her diet. Pt currently has Ensure and Prosource plus ordered and has been consuming them. RD to continue with current orders to aid in caloric and protein needs. Labs and medications reviewed.   Diet Order:   Diet Order             DIET - DYS 1 Room service appropriate? Yes; Fluid consistency: Honey Thick  Diet effective now                   EDUCATION NEEDS:   Not appropriate for education at this time  Skin:  Skin Assessment: Reviewed RN Assessment  Last BM:  8/22  Height:   Ht Readings from Last 1 Encounters:  06/02/21 _0  (1.575 m)    Weight:   Wt Readings from Last 1 Encounters:  06/04/21 53.5 kg    BMI:  Body mass index is 21.57 kg/m.  Estimated Nutritional Needs:   Kcal:  1550-1700  Protein:  75-85 grams  Fluid:  >/= 1.5 L/day  Corrin Parker, MS, RD, LDN RD pager number/after hours weekend pager number on Amion.

## 2021-06-14 NOTE — Progress Notes (Signed)
Physical Therapy Session Note  Patient Details  Name: Vickie Mckee MRN: [id number] Date of Birth: Jun 21, 1931  Today's Date: 06/14/2021 PT Individual Time: [id number] and [id number] PT Individual Time Calculation (min): 42 min and 47 min  Short Term Goals: Week 2:  PT Short Term Goal 1 (Week 2): Pt will perform supine<>sit with max assist of 1 consistently PT Short Term Goal 2 (Week 2): Pt will perform bed<>chair transfers with max assist of 1 consistently PT Short Term Goal 3 (Week 2): Pt will perform sit<>stands with max assist of 1 PT Short Term Goal 4 (Week 2): Pt will participate in body weight supported high intensity gait training reaching 88f  Skilled Therapeutic Interventions/Progress Updates:    Session 1: Pt received supine in bed with her daughter present and pt agreeable to therapy session despite reporting fatigue but able to redirect and easily encouraged to participate. Donned litegait harness in supine. Rolling L using bedrail with light min assist and pt demoing improved initiation and pelvic rotation. Rolling R with max assist after assisting L LE into hooklying position to promote increased pt independence. Supine>sitting R EOB, HOB flat but using bedrail, via max assist of 1 for B LE management and trunk upright - pt continues to have difficulty using R UE to assist with trunk upright and has strong posterior lean. L squat pivot EOB>w/c with max assist of 1 for lifting/pivoting hips and pt demonstrating significant improvement in ability to power up through B LEs.  Transported to/from gym in w/c for time management and energy conservation.   Donned L LE ankle DF assist ACE wrap. Sit>stand w/c>R UE support on litegait with heavy max assist of 1 for lifting to stand - pt continues to have strong posterior lean and lack of hip extension to come to full stand while +2 assist connected harness to litegait body weight support (BWS) system. Attached bilateral hip black bungee cords to  promote hip extension and forward movement of pelvis during gait. Gait training ~556fwith partial BWS (slightly less compared to yesterday) with +2 assist managing litegait and max assist for L LE management - pt able to perform ~50% of swing requiring assist to advance past R LE and prevent adduction, requires heavier assist during stance as pt unable to sustain hip/knee extension.  Doffed harness and transported back to room. Pt left seated in w/c with needs in reach, seat belt alarm on, and word puzzle set-up per pt request.  Session 2:  Pt received asleep, supine in bed and upon awakening pt disoriented to time of day thinking it was breakfast - therapist reoriented pt. Pt agreeable to therapy session. Donned litegait harness in supine as described above - rolling L using bedrail with very light min assist (continues to demo improved pelvic rotation) and rolling R with max assist after therapist facilitated L LE into hooklying position to promote pelvic rotation. Supine>sitting R EOB, HOB flat but using bedrail, with heavy max assist of 1 for trunk upright due to R posterior trunk lean upon coming upright. Requires mod/max assist to scoot hips to EOB prior to being able to achieve trunk in midline with only CGA. L squat pivot EOB>w/c with max assist of 1 for lifting and pivoting hips - cuing for head/hips relationship and increased anterior trunk lean - requires manual facilitation to place L LE during transfer.  Transported to/from gym in w/c for time management and energy conservation.   Sit>stand from w/c>R UE support on litegait with heavy mod  assist of 1 for lifting to stand and manually facilitating increased trunk/hip extension and anterior weight shift - pt with significant improvement in ability to come to stand with litegait handle placed far anterior forcing anterior lean in sitting and while coming to stand. +2 assist attached litegait harness to body weight support (BWS).  Donned L LE ankle  DF assist ACE wrap. High intensity gait training ~55f 2x overground while in partial BWS litegait with +2 assist to manage litegait and max assist from therapist to facilitate L LE gait mechanics - did not use bungee straps at hips this session with pt demoing improved hip extension though still has excessive R lateral trunk lean with flexion - pt able to advance L LE ~50-70% during swing requiring min/mod assist to advance further for reciprocal step and prevent excessive adduction - demos some activation of L LE hip/knee extension during stance but still requires max/total assist. Doffed harness.   Upon pt lifting L LE to place on w/c foot rest pt demonstrates ACTIVE ankle DF! Of at least 2-/5 MMT along with increased hip flexion strength to lift foot high enough to place footrest without assistance. Pt transported back to room. Pt left seated in w/c with needs in reach, LUE supported on pillow, and seat belt alarm on.   Therapy Documentation Precautions:  Precautions Precautions: Fall Precaution Comments: L hemiparesis Restrictions Weight Bearing Restrictions: No  Pain:  Session 1: Reports some L anterior hip discomfort upon initially coming to stand but then no additional complaints during session.  Session 2: Continues to report some L anterior hip discomfort initially upon coming to stand but then no additional complaints during session.   Therapy/Group: Individual Therapy  CTawana Scale, PT, DPT, NCS, CSRS 06/14/2021, 3:16 PM

## 2021-06-14 NOTE — Progress Notes (Signed)
Speech Language Pathology Daily Session Note  Patient Details  Name: Vickie Mckee MRN: [id number] Date of Birth: [birth year]  Today's Date: 06/14/2021 SLP Individual Time: 1300-1400 SLP Individual Time Calculation (min): 60 min  Short Term Goals: Week 2: SLP Short Term Goal 1 (Week 2): Patient will demonstrate orientation x4 with sup A verbal cues to refer to compensatory aids SLP Short Term Goal 2 (Week 2): Patient will demonstrate basic problem solving for basic tasks with sup-to-min A verbal cues SLP Short Term Goal 3 (Week 2): Patient will achieve 75-90% intelligibility with sup A verbal cues to increase vocal intensity at sentence level to more effectively communicate functional needs SLP Short Term Goal 4 (Week 2): Patient will tolerate current diet with minimal-to-no overt s/sx of aspiration with sup A verbal cues for use of swallowing compensatory strategies SLP Short Term Goal 5 (Week 2): Patient will demonstrate effective mastication and oral clearance of Dys 2 trials with no overt s/sx of aspiration over at least 2 observed sessions to demonstrate readiness for diet advancement. SLP Short Term Goal 6 (Week 2): Patient will sustain attention for 8 minute intervals with min A verbal redirection  Skilled Therapeutic Interventions: Patient agreeable to skilled ST intervention with focus on cognitive and swallow goals. Patient appeared more drowsy today, frequently c/o sleepiness, and perseverated on left hip discomfort which improved following repositioning, emotional support, and distraction. Patient completed 20 repetitions of chin tuck against resistance (CTAR) with sup A verbal cues. SLP facilitated simple verbal reasoning task involving naming words within categories with min A verbal cues for sustained attention and working memory of items previously discussed. Use of dry erase board was effective for recall. As mentioned previously, she was very internally distracted today which  impacted her performance. Similar to yesterday's session, patient was oriented to person, place, and situation. She located month and year via orientation visuals with sup A verbal cues, although unable to retain for >5 minutes. Patient consumed sips of honey thick liquids by cup with mild anterior labial spillage on left without awareness. No overt s/sx of aspiration noted. Daughter endorsed pt continues to tolerate pureed textures and honey thick liquids with minimal overt s/sx of aspiration. Patient was left in her bed with alarm activated and immediate needs within reach at end of session. Updated her daughter on progress of today's session and cognitive-communication techniques to support recall and orientation skills. Continue per current plan of care.      Pain Pain Assessment Pain Scale: 0-10 Pain Score: 5  Pain Location: Hip Pain Orientation: Left Pain Descriptors / Indicators: Discomfort Pain Onset: On-going Pain Intervention(s): Repositioned;Emotional support;Distraction  Therapy/Group: Individual Therapy  Patty Sermons 06/14/2021, 3:36 PM

## 2021-06-14 NOTE — Progress Notes (Signed)
Speech Language Pathology Weekly Progress and Session Note  Patient Details  Name: Vickie Mckee MRN: [id number] Date of Birth: [birth year]  Beginning of progress report period: June 09, 2021 End of progress report period: June 16, 2021  Today's Date: 06/16/2021 SLP Individual Time: 0800-0900 SLP Individual Time Calculation (min): 60 min  Short Term Goals: Week 2: SLP Short Term Goal 1 (Week 2): Patient will demonstrate orientation x4 with sup A verbal cues to refer to compensatory aids SLP Short Term Goal 1 - Progress (Week 2): Progressing toward goal SLP Short Term Goal 2 (Week 2): Patient will demonstrate basic problem solving for basic tasks with sup-to-min A verbal cues SLP Short Term Goal 2 - Progress (Week 2): Met SLP Short Term Goal 3 (Week 2): Patient will achieve 75-90% intelligibility with sup A verbal cues to increase vocal intensity at sentence level to more effectively communicate functional needs SLP Short Term Goal 3 - Progress (Week 2): Met SLP Short Term Goal 4 (Week 2): Patient will tolerate current diet with minimal-to-no overt s/sx of aspiration with sup A verbal cues for use of swallowing compensatory strategies SLP Short Term Goal 4 - Progress (Week 2): Progressing toward goal SLP Short Term Goal 5 (Week 2): Patient will demonstrate effective mastication and oral clearance of Dys 2 trials with no overt s/sx of aspiration over at least 2 observed sessions to demonstrate readiness for diet advancement. SLP Short Term Goal 5 - Progress (Week 2): Met SLP Short Term Goal 6 (Week 2): Patient will sustain attention for 8 minute intervals with min A verbal redirection SLP Short Term Goal 6 - Progress (Week 2): Progressing toward goal  New Short Term Goals: Week 3: SLP Short Term Goal 1 (Week 3): Patient will demonstrate orientation x4 with sup A verbal cues to refer to compensatory aids SLP Short Term Goal 2 (Week 3): Patient will demonstrate problem solving for basic  tasks with sup verbal cues SLP Short Term Goal 3 (Week 3): Patient will tolerate current diet with minimal-to-no overt s/sx of aspiration with min A verbal cues for use of swallowing compensatory strategies SLP Short Term Goal 4 (Week 3): Patient will sustain attention for 8 minute intervals with min A verbal redirection SLP Short Term Goal 5 (Week 3): Patient will retain simple novel information following 3 minute delay with 75% accuracy and min A verbal cues  Weekly Progress Updates: Patient has met 3 of 6 short term goals this reporting period. Patient has made functional progress with ST over the past week. Her progress with cognitive-linguistic tasks fluctuates secondary to level of fatigue and during structured vs. unstructred activities. She is most frequently able to complete basic cognitive tasks for short duration <8 mins) with sup A verbal cues and requires increased cueing (mod support) during unstructured tasks for sustained attention, orientation, and working memory. She demonstrates improved carry over of utilizing orientation visuals in her room with sup-to-min A verbal reminders, improved cognitive endurance, and overall alertness. Intermittent confusion still evident. Improved vocal intensity 2/2 improved breath support for speech. Speech perceived as >90% intelligible at conversation level. Patient obtained MBS on 8/25 revealing improvement as compared to MBS on 8/9. Results revealed a mild oral and mild-to-moderate pharyngeal phase dysphagia with recommendations for Dys 2 textures and thin liquids. Meds whole in puree or as tolerated. Patient/family education is ongoing with two daughters. Recommend continued skilled ST intervention to maximize cognitive-linguistic, and swallow function and functional independence prior to discharge. Continue progression toward established LTGs  set at [facility] level overall.    Intensity: Minumum of 1-2 x/day, 30 to 90 minutes Frequency: 3 to 5 out of 7  days Duration/Length of Stay: 09/09 Treatment/Interventions: Cognitive remediation/compensation;Environmental controls;Internal/external aids;Speech/Language facilitation;Cueing hierarchy;Dysphagia/aspiration precaution training;Functional tasks;Patient/family education;Medication managment;Therapeutic Activities  Daily Session Skilled Therapeutic Interventions: Patient agreeable to skilled ST intervention with focus on swallowing and cognitive goals. Patient was performing self feeding with breakfast with full supervision from nurse tech at bedside. Patient consumed Dys 2 textures and thin liquids with 3 instances of coughing throughout entire duration of meal (~40 minutes). She required max verbal cues to implement safe swallow precautions including small bites/sips, taking one bite at a time, cues to discontinue talking with bolus in oral cavity, and cues to perform lingual sweep to clear left pocketing. At times, SLP physically withheld patient from taking additional bites due to impulsive rate of consumption. ST provided light touch on hand and/or removed utensil from hand to prevent patient from taking further bites as verbal cues were sometimes not effective. Patient perseverated on feeling "sleepy" and repeated this constantly throughout session (approximately every minute) with limited impulse control to discontinue talking while consuming breakfast. Patient only able to recall 1/3 safe swallow precautions after 30 second delay during 4/4 attempts. ST followed up with daughter to inform of performance during breakfast and emphasis on safe swallow precautions with Dys 2 textures. Patient was left in bed with alarm activated and immediate needs within reach at end of session. Continue per current plan of care.       General    Pain- denied pain  Therapy/Group: Individual Therapy  Patty Sermons 06/16/2021, 4:19 PM

## 2021-06-14 NOTE — Progress Notes (Signed)
Occupational Therapy Session Note  Patient Details  Name: Vickie Mckee MRN: [id number] Date of Birth: Nov 19, 1930  Today's Date: 06/14/2021 OT Individual Time: [id number] OT Individual Time Calculation (min): 56 min    Short Term Goals: Week 2:  OT Short Term Goal 1 (Week 2): Pt will complete UB bathing sitting supported in wheelchair with min assist for 2 consecutive sessions. OT Short Term Goal 2 (Week 2): Pt will complete UB dressing with mod assist for donning a pullover shirt following hemi dressing techniques. OT Short Term Goal 3 (Week 2): Pt will complete squat pivot transfer to the drop arm commode with max assist.  Skilled Therapeutic Interventions/Progress Updates:    Pt in the bed with increased time need to get her to wake up and then her stating that she was very sleepy and wanted to lay down throughout the session.  Completed washing front and back peri area in bed with setup for washing the front and then total assist for washing her buttocks with max assist for rolling side to side.  She was able to complete supine to sit with max assist and then maintained sitting balance with UB bathing and dressing at min assist level.  Max assist needed for donning pullover shirt following hemi techniques with total +2 (pt 25%) for LB dressing sit to stand.  Increased trunk flexion with decreased hip and left knee extension noted.  Max assist for squat pivot transfer to the wheelchair to finish session.  She was left with the call button and phone in reach with safety belt in place.  Nursing and NT present as well to provide supervision for self feeding and to administer her medications.    Therapy Documentation Precautions:  Precautions Precautions: Fall Precaution Comments: L hemiparesis Restrictions Weight Bearing Restrictions: No  Pain: Pain Assessment Pain Scale: Faces Pain Score: 0-No pain ADL: See Care Tool Section for some details of mobility and selfcare   Therapy/Group:  Individual Therapy  Jennine Peddy OTR/L 06/14/2021, 10:40 AM

## 2021-06-14 NOTE — Progress Notes (Signed)
Patient ID: Vickie Mckee, female   DOB: 03-19-1931, 87 y.o.   MRN: [id number]  SW covering for primary [place].   SW called pt dtr Butch Penny [phone]) to provide updates from team conference and d/c date remains the same. SW informed will have primary SW f.u about alternate options, d/c planning, and family education.   Loralee Pacas, MSW, [place] Office: [phone] Cell: [phone] Fax: [phone]

## 2021-06-15 ENCOUNTER — Inpatient Hospital Stay (HOSPITAL_COMMUNITY): Payer: Medicare Other

## 2021-06-15 LAB — CREATININE, SERUM
Creatinine, Ser: 0.98 mg/dL (ref 0.44–1.00)
GFR, Estimated: 56 mL/min — ABNORMAL LOW (ref >60)

## 2021-06-15 NOTE — Progress Notes (Signed)
Physical Therapy Session Note  Patient Details  Name: Vickie Mckee MRN: [id number] Date of Birth: 05/08/1931  Today's Date: 06/15/2021 PT Individual Time: [id number] and [id number] PT Individual Time Calculation (min): 11 min  and 55 min and  Today's Date: 06/15/2021 PT Missed Time: 19 Minutes Missed Time Reason: Patient fatigue  Short Term Goals: Week 2:  PT Short Term Goal 1 (Week 2): Pt will perform supine<>sit with max assist of 1 consistently PT Short Term Goal 2 (Week 2): Pt will perform bed<>chair transfers with max assist of 1 consistently PT Short Term Goal 3 (Week 2): Pt will perform sit<>stands with max assist of 1 PT Short Term Goal 4 (Week 2): Pt will participate in body weight supported high intensity gait training reaching 74f  Skilled Therapeutic Interventions/Progress Updates:    Session 1: Pt received supine in bed in a deep sleep with her daughters present. Her daughters report they have been trying to waken her for a while and haven been unsuccessful - pt currently snoring. Attempted to awaken pt with tactile and verbal stimulus with pt slightly opening eyes and quietly stating "I'm so sleepy."  Provided +2 total assist supine>sit R EOB to promote increased alertness but pt continued to keep eyes closed. With total assist for sitting balance provided pt wash cloth and she was able to wash her face but still keeping eyes closed. Unable to arouse patient enough to participate in session therefore returned to supine +2 total assist. Pt left supine in bed with needs in reach and bed alarm on - pt asleep with NT present. Missed 19 minutes of skilled physical therapy.  Session 2 (Time: [id number]: Pt received supine in bed just awakening from a nap and stating "I'm sleepy." But despite this pt agreeable to therapy session. Donned litegait harness in supine. Rolling L using bedrail with verbal cuing for sequencing and pt able to complete with supervision given increased time.  Rolling R with max assist to facilitate L LE into hooklying position and then for trunk/pelvic rotation. Supine>sitting R EOB, HOB flat and using bedrail, heavy max assist for trunk upright and sitting balance once EOB due to R posterior lean - requires mod assist to scoot hips forward to EOB then able to improve to min assist for trunk control while +2 assist donned tennis shoes total assist. L squat pivot EOB>w/c with max assist for lifting/pivoting hips and cuing for head/hips relationship and increased anterior trunk lean.  Transported to/from gym in w/c for time management and energy conservation.   Donned L LE ankle DF assist ACE wrap. Sit>stand w/c>R UE support on litegait with mod assist for coming to stand when given increased time (continued to place lite gait handle far anteriorly to promote increased anterior trunk lean) - blocking L knee as needed and facilitating increased hip extension with cuing for trunk extension.  Supported L UE during the following. Gait training 444fand [id number] in litegait harness for partial body weight support and utilizing black bungee cords at bilateral hips to promote increased hip extension and anterior movement of pelvis over stance limbs - continues to demo ability to advance L LE ~50-75% of the way requiring min/mod assist to achieve full reciprocal stepping pattern - continues to require mod/max assist for increased L LE hip/knee extension during stance with facilitation for L weight shift to allow step-through on R LE. Doffed litegait harness.  Transported back to room in w/c and left in care of NT to assist with meal.  Therapy Documentation Precautions:  Precautions Precautions: Fall Precaution Comments: L hemiparesis Restrictions Weight Bearing Restrictions: No   Pain: Session 1: No reports of pain throughout session.  Session 2: No reports of pain throughout session.   Therapy/Group: Individual Therapy  Tawana Scale , PT, DPT,  NCS, CSRS 06/15/2021, 3:00 PM

## 2021-06-15 NOTE — Progress Notes (Signed)
PROGRESS NOTE   Subjective/Complaints:   Pt reports OK - just woke up- Pt also reports R upper tooth hurting- "not bad"- thinks tylenol will help.   On D1 honey thick liquids.    ROS:  Impaired due to cognition/memory  Objective:   No results found. No results for input(s): WBC, HGB, HCT, PLT in the last 72 hours.  Recent Labs    06/15/21 0514  CREATININE 0.98    Intake/Output Summary (Last 24 hours) at 06/15/2021 1052 Last data filed at 06/15/2021 0700 Gross per 24 hour  Intake 354 ml  Output --  Net 354 ml        Physical Exam: Vital Signs Blood pressure (!) 119/55, pulse 70, temperature 97.6 F (36.4 C), resp. rate 16, height '5\' 2"'$  (1.575 m), weight 53.5 kg, SpO2 94 %.    General: awake, alert, appropriate, sitting up in bed; sleepy- being supervised by NT for eating; NAD HENT: conjugate gaze; oropharynx moist CV: regular rate; no JVD Pulmonary: CTA B/L; no W/R/R- good air movement GI: soft, NT, ND, (+)BS Psychiatric: appropriate; dazed/  Neurological: Ox1; poor memory- memory aids in room on wall.   Motor: LUE: 0/5 proximal distal, except trace Left triceps  LLE: Hip flexion, knee extension 2-/5, ankle dorsiflexion 0/5 Absent Triple flexor response LLE  Sensation intact LT on left side and right  Dysarthria  Assessment/Plan: 1. Functional deficits which require 3+ hours per day of interdisciplinary therapy in a comprehensive inpatient rehab setting. Physiatrist is providing close team supervision and 24 hour management of active medical problems listed below. Physiatrist and rehab team continue to assess barriers to discharge/monitor patient progress toward functional and medical goals  Care Tool:  Bathing    Body parts bathed by patient: Left arm, Right upper leg, Left upper leg, Abdomen, Chest, Face, Front perineal area   Body parts bathed by helper: Buttocks, Right lower leg, Left lower  leg, Right arm     Bathing assist Assist Level: Maximal Assistance - Patient 24 - 49% (supine to sit)     Upper Body Dressing/Undressing Upper body dressing   What is the patient wearing?: Pull over shirt    Upper body assist Assist Level: Maximal Assistance - Patient 25 - 49%    Lower Body Dressing/Undressing Lower body dressing      What is the patient wearing?: Incontinence brief, Pants     Lower body assist Assist for lower body dressing: 2 Helpers (sit to stand)     Chartered loss adjuster assist Assist for toileting: 2 Helpers     Transfers Chair/bed transfer  Transfers assist     Chair/bed transfer assist level: Maximal Assistance - Patient 25 - 49% (squat pivot)     Locomotion Ambulation   Ambulation assist   Ambulation activity did not occur: Safety/medical concerns  Assist level: Total Assistance - Patient < 25% Assistive device: Lite Gait Max distance: 37f   Walk 10 feet activity   Assist  Walk 10 feet activity did not occur: Safety/medical concerns        Walk 50 feet activity   Assist Walk 50 feet with 2 turns activity did not occur:  Safety/medical concerns         Walk 150 feet activity   Assist Walk 150 feet activity did not occur: Safety/medical concerns         Walk 10 feet on uneven surface  activity   Assist Walk 10 feet on uneven surfaces activity did not occur: Safety/medical concerns         Wheelchair     Assist Is the patient using a wheelchair?: Yes Type of Wheelchair: Manual Wheelchair activity did not occur: Safety/medical concerns         Wheelchair 50 feet with 2 turns activity    Assist    Wheelchair 50 feet with 2 turns activity did not occur: Safety/medical concerns       Wheelchair 150 feet activity     Assist  Wheelchair 150 feet activity did not occur: Safety/medical concerns       Blood pressure (!) 119/55, pulse 70, temperature 97.6 F (36.4 C),  resp. rate 16, height '5\' 2"'$  (1.575 m), weight 53.5 kg, SpO2 94 %.      Medical Problem List and Plan: 1.   Left-sided hemiparesis with dysarthria/dysphagia secondary to right pontine infarction as well as multiple small remote bilateral cerebellar lacunar infarcts Continue CIR  Cont PRAFO, WHO nightly- team conf in am  Con't PT, OT and SLP 2.  Impaired mobility: Continue Lovenox for DVT prophylaxis             -antiplatelet therapy: Aspirin 81 mg daily and Plavix 75 mg day x1 month then Plavix alone Flexor withdrawal spasms LLE-zanaflex at night but may be causing am fatigue will d/c 3. Pain Management/chronic back pain: Tylenol as needed 4. Depression: Continue Zoloft 100 mg daily, Aricept 5 mg nightly             -antipsychotic agents: N/A 5. Neuropsych: This patient is capable of making decisions on her own behalf. 6. Skin/Wound Care: Routine skin checks 7. Fluids/Electrolytes/Nutrition: Routine in and outs 8. Post stroke dysphagia.  Dysphagia #1 nectar thick liquids.  Follow-up speech therapy.  Monitor hydration   Advance diet as tolerated 9.  Hypertension.  Patient on verapamil 240 mg daily prior to admission as well as HCTZ 25 mg daily.     Vitals:   06/14/21 2049 06/15/21 0512  BP: 121/61 (!) 119/55  Pulse: 83 70  Resp: 18 16  Temp: (!) 97.5 F (36.4 C) 97.6 F (36.4 C)  SpO2: 91% 94%   8/25- BP controlled- con't regimen  10.  Hyperlipidemia.  Lipitor 11.  Hypothyroidism.  TSH 2.093 .Continue Synthroid 12.  Constipation.  MiraLAX scheduled daily.  Senokot S2 tablets nightly  13. Hypokalemia: Supplementation started   8/25- will recheck labs- has been 10 days- in AM 14. Crackles and coughing with food intake: placed nursing order for must be up with meals, resolved   Chest x-ray unremarkable for acute process- per SLP fluids downgraded to Honey thick with repeat MBS this wk?  8/25- Pt now on D2 with thin liquids per orders- just upgraded today- so will recheck labs in  AM 15.  Hypoalbuminemia  Supplement initiated on 8/13 16.  Cognitive deficits post stroke - has evidence of small vessel disease on imaging as well as the new pontine infarct   8/25- con't memory aids on wall.  17. R upper tooth pain-  8/25- con't tylenol prn   LOS: 14 days A FACE TO FACE EVALUATION WAS PERFORMED  Enedelia Martorelli 06/15/2021, 10:52 AM

## 2021-06-15 NOTE — Progress Notes (Signed)
Speech Language Pathology Daily Session Note  Patient Details  Name: Vickie Mckee MRN: [id number] Date of Birth: 1931/01/12  Today's Date: 06/15/2021 SLP Individual Time: 1200-1215 SLP Individual Time Calculation (min): 15 min  Short Term Goals: Week 2: SLP Short Term Goal 1 (Week 2): Patient will demonstrate orientation x4 with sup A verbal cues to refer to compensatory aids SLP Short Term Goal 2 (Week 2): Patient will demonstrate basic problem solving for basic tasks with sup-to-min A verbal cues SLP Short Term Goal 3 (Week 2): Patient will achieve 75-90% intelligibility with sup A verbal cues to increase vocal intensity at sentence level to more effectively communicate functional needs SLP Short Term Goal 4 (Week 2): Patient will tolerate current diet with minimal-to-no overt s/sx of aspiration with sup A verbal cues for use of swallowing compensatory strategies SLP Short Term Goal 5 (Week 2): Patient will demonstrate effective mastication and oral clearance of Dys 2 trials with no overt s/sx of aspiration over at least 2 observed sessions to demonstrate readiness for diet advancement. SLP Short Term Goal 6 (Week 2): Patient will sustain attention for 8 minute intervals with min A verbal redirection  Skilled Therapeutic Interventions: Patient was performing self feeding with her lunch on arrival with daughters at bedside providing supervision and assist as needed. Patient and family agreeable for SLP to assess diet tolerance with recent diet advancement to Dys 2 textures and thin liquids per MBS today. Patient tolerated Dys 2 textures with trace anterior labial spillage to mid chin with minimal-to-no awareness requiring verbal cue to clear. She displayed oral clearance with trace residuals in oral cavity which were effectively cleared with liquid rinse. She exhibited delayed cough response x1 following sequential thin liquid sips via straw. Occasional throat clearing exhibited with Dys 2  textures, otherwise no other overt s/sx of aspiration observed during 15 minute duration. SLP provided follow up education on MBS results, diet advancement, and safe swallow precautions and strategies including small bites/sips, slow rate of intake, and pausing between bites if coughing occurs to ensure coughing/throat clearing has resolved. Continuation of Dys 2 textures and thin liquids appears appropriate at this time. Patient was left in bed with alarm activated and immediate needs within reach at end of session. Daughters still at bedside. Continue per current plan of care.      Pain Pain Assessment Pain Scale: 0-10 Pain Score: 0-No pain  Therapy/Group: Individual Therapy  Patty Sermons 06/15/2021, 12:33 PM

## 2021-06-15 NOTE — Plan of Care (Signed)
  Problem: RH BOWEL ELIMINATION Goal: RH STG MANAGE BOWEL WITH ASSISTANCE Description: STG Manage Bowel with min Assistance. Outcome: Not Progressing; incontinence   Problem: RH BLADDER ELIMINATION Goal: RH STG MANAGE BLADDER WITH ASSISTANCE Description: STG Manage Bladder With min Assistance Outcome: Not Progressing; incontinence   

## 2021-06-15 NOTE — Progress Notes (Signed)
Modified Barium Swallow Progress Note  Patient Details  Name: Vickie Mckee MRN: [id number] Date of Birth: 02/12/1931  Today's Date: 06/15/2021  Modified Barium Swallow completed.  Full report located under Chart Review in the Imaging Section.  Brief recommendations include the following:  Clinical Impression  Patient presents with a mild oral and a mild-moderate pharyngeal phase dysphagia that is improved as compared to most recent MBS on 8/9. during oral phase, patient exhibited a mild delay in anterior to posterior transit of liquid and solid boluses and premature spillage of thin and nectar thick liquids. During pharyngeal phase, patient exhibited two instances of very trace penetration (PAS 2, 3) during the swallow with thin liquids but without aspiration and with eventual clearance of penetrate. Swallow initiation was delayed to level of pyriform sinus with thin liquids and nectar thick liquids and delayed to level of vallecular sinus with puree solids and regular solids. Trace vallecular sinus residuals were observed after initial swallow with regular solids, but cleared fully after subsequent swallows. No significant pharyngeal residuals observed with thin liquids, puree soilds, nectar thick liquids. SLP is recommending to upgrade patient's current diet to Dys 2 (fine chop), thin liquids.   Swallow Evaluation Recommendations       SLP Diet Recommendations: Dysphagia 2 (Fine chop) solids;Thin liquid   Liquid Administration via: Cup;Straw   Medication Administration: Whole meds with puree   Supervision: Patient able to self feed;Full supervision/cueing for compensatory strategies   Compensations: Minimize environmental distractions;Slow rate;Small sips/bites   Postural Changes: Seated upright at 90 degrees;Remain semi-upright after after feeds/meals (Comment) (20-30 minutes)   Oral Care Recommendations: Oral care BID        Sonia Baller, MA, CCC-SLP Speech Therapy

## 2021-06-15 NOTE — Progress Notes (Signed)
Occupational Therapy Session Note  Patient Details  Name: Vickie Mckee MRN: [id number] Date of Birth: Mar 14, 1931  Today's Date: 06/15/2021 OT Individual Time: 1004-1101 OT Individual Time Calculation (min): 57 min    Short Term Goals: Week 2:  OT Short Term Goal 1 (Week 2): Pt will complete UB bathing sitting supported in wheelchair with min assist for 2 consecutive sessions. OT Short Term Goal 2 (Week 2): Pt will complete UB dressing with mod assist for donning a pullover shirt following hemi dressing techniques. OT Short Term Goal 3 (Week 2): Pt will complete squat pivot transfer to the drop arm commode with max assist.  Skilled Therapeutic Interventions/Progress Updates:    Pt in bed to start session with focus on dressing tasks EOB.  Mod encouragement with max assist for supine to sit.  She was able to maintain static sitting balance at supervision level with min assist for dynamic during dressing.  Max assist for doffing and donning new pullover shirt with max assist for threading new pants over her feet.  Total assist +2 (pt 25%) for sit to stand.  Worked on Psychiatrist socks with max assist as well as donning TEDs and shoes at total assist.  Max assist for squat pivot transfer to the wheelchair from the bed in order to complete brushing her hair and teeth at the sink.  Setup assist for these tasks with pt left sitting up in the wheelchair at bedside.  Call button and phone in reach with safety alarm in place    Therapy Documentation Precautions:  Precautions Precautions: Fall Precaution Comments: L hemiparesis Restrictions Weight Bearing Restrictions: No  Pain: Pain Assessment Pain Scale: 0-10 Pain Score: 0-No pain ADL: See Care Tool Section for some details of mobility and selfcare  Therapy/Group: Individual Therapy  Arnell Mausolf OTR/L 06/15/2021, 4:11 PM

## 2021-06-16 LAB — CBC WITH DIFFERENTIAL/PLATELET
Abs Immature Granulocytes: 0.05 10*3/uL (ref 0.00–0.07)
Basophils Absolute: 0.1 10*3/uL (ref 0.0–0.1)
Basophils Relative: 1 %
Eosinophils Absolute: 0.4 10*3/uL (ref 0.0–0.5)
Eosinophils Relative: 4 %
HCT: 40.3 % (ref 36.0–46.0)
Hemoglobin: 13.4 g/dL (ref 12.0–15.0)
Immature Granulocytes: 1 %
Lymphocytes Relative: 30 %
Lymphs Abs: 3.2 10*3/uL (ref 0.7–4.0)
MCH: 30.1 pg (ref 26.0–34.0)
MCHC: 33.3 g/dL (ref 30.0–36.0)
MCV: 90.6 fL (ref 80.0–100.0)
Monocytes Absolute: 1 10*3/uL (ref 0.1–1.0)
Monocytes Relative: 9 %
Neutro Abs: 5.9 10*3/uL (ref 1.7–7.7)
Neutrophils Relative %: 55 %
Platelets: 250 10*3/uL (ref 150–400)
RBC: 4.45 MIL/uL (ref 3.87–5.11)
RDW: 14.8 % (ref 11.5–15.5)
WBC: 10.7 10*3/uL — ABNORMAL HIGH (ref 4.0–10.5)
nRBC: 0 % (ref 0.0–0.2)

## 2021-06-16 LAB — BASIC METABOLIC PANEL
Anion gap: 6 (ref 5–15)
BUN: 33 mg/dL — ABNORMAL HIGH (ref 8–23)
CO2: 27 mmol/L (ref 22–32)
Calcium: 9.1 mg/dL (ref 8.9–10.3)
Chloride: 104 mmol/L (ref 98–111)
Creatinine, Ser: 1.03 mg/dL — ABNORMAL HIGH (ref 0.44–1.00)
GFR, Estimated: 53 mL/min — ABNORMAL LOW (ref >60)
Glucose, Bld: 105 mg/dL — ABNORMAL HIGH (ref 70–99)
Potassium: 4.2 mmol/L (ref 3.5–5.1)
Sodium: 137 mmol/L (ref 135–145)

## 2021-06-16 NOTE — Plan of Care (Signed)
  Problem: Sit to Stand Goal: LTG:  Patient will perform sit to stand in prep for activites of daily living with assistance level (OT) Description: LTG:  Patient will perform sit to stand in prep for activites of daily living with assistance level (OT) Flowsheets (Taken 06/16/2021 1231) LTG: PT will perform sit to stand in prep for activites of daily living with assistance level: (Goals downgraded secondary to slower progress than expected) Maximal Assistance - Patient 25 - 49% Note: Goals downgraded secondary to slower progress than expected   Problem: RH Bathing Goal: LTG Patient will bathe all body parts with assist levels (OT) Description: LTG: Patient will bathe all body parts with assist levels (OT) Flowsheets (Taken 06/16/2021 1231) LTG: Pt will perform bathing with assistance level/cueing: (Goals downgraded secondary to slower progress than expected) Maximal Assistance - Patient 25 - 49% LTG: Position pt will perform bathing: Sit to Stand Note: Goals downgraded secondary to slower progress than expected   Problem: RH Dressing Goal: LTG Patient will perform lower body dressing w/assist (OT) Description: LTG: Patient will perform lower body dressing with assist, with/without cues in positioning using equipment (OT) Flowsheets (Taken 06/16/2021 1231) LTG: Pt will perform lower body dressing with assistance level of: (Goals downgraded secondary to slower progress than expected) Maximal Assistance - Patient 25 - 49% Note: Goals downgraded secondary to slower progress than expected   Problem: RH Toileting Goal: LTG Patient will perform toileting task (3/3 steps) with assistance level (OT) Description: LTG: Patient will perform toileting task (3/3 steps) with assistance level (OT)  Flowsheets (Taken 06/16/2021 1231) LTG: Pt will perform toileting task (3/3 steps) with assistance level: (Goals downgraded secondary to slower progress than expected) Maximal Assistance - Patient 25 - 49% Note:  Goals downgraded secondary to slower progress than expected   Problem: RH Toilet Transfers Goal: LTG Patient will perform toilet transfers w/assist (OT) Description: LTG: Patient will perform toilet transfers with assist, with/without cues using equipment (OT) Flowsheets (Taken 06/16/2021 1231) LTG: Pt will perform toilet transfers with assistance level of: (Goals downgraded secondary to slower progress than expected) Moderate Assistance - Patient 50 - 74% Note: Goals downgraded secondary to slower progress than expected   Problem: RH Tub/Shower Transfers Goal: LTG Patient will perform tub/shower transfers w/assist (OT) Description: LTG: Patient will perform tub/shower transfers with assist, with/without cues using equipment (OT) Flowsheets (Taken 06/16/2021 1231) LTG: Pt will perform tub/shower stall transfers with assistance level of: (Goals downgraded secondary to slower progress than expected) Maximal Assistance - Patient 25 - 49% LTG: Pt will perform tub/shower transfers from: Tub/shower combination Note: Goals downgraded secondary to slower progress than expected

## 2021-06-16 NOTE — Progress Notes (Signed)
Physical Therapy Session Note  Patient Details  Name: Vickie Mckee MRN: [id number] Date of Birth: January 10, 1931  Today's Date: 06/16/2021 PT Individual Time: 1100-1130 PT Individual Time Calculation (min): 30 min   Short Term Goals: Week 2:  PT Short Term Goal 1 (Week 2): Pt will perform supine<>sit with max assist of 1 consistently PT Short Term Goal 2 (Week 2): Pt will perform bed<>chair transfers with max assist of 1 consistently PT Short Term Goal 3 (Week 2): Pt will perform sit<>stands with max assist of 1 PT Short Term Goal 4 (Week 2): Pt will participate in body weight supported high intensity gait training reaching 65f  Skilled Therapeutic Interventions/Progress Updates:  Pt received sitting in TIS wheelchair and agreeable to therapy. Pt transported to dayroom.  STS to the lite gait handrail with maxA +2 to boost up and left knee block x4. Verbal cuing for anterior weight shifting, pt requires increased time to initiate.  +2 assist to maintain standing upright with facilitation for hip extension and posterior pelvis and with left knee blocked while +3 donned lite gait harness in standing. DF assist wrap LLE in sitting   Lite gait utilized over treadmill during session for partial bodyweight support. 635fand then 10044fMedium level tension cables applied to anterior hips for hip extension facilitation and promote improved upright posture. ModA for manual facilitation of swing phase and improved knee extension in stance on LLE. Pt demonstrates progression through activity of improved ability to initiate and complete ~40% of swing phase. Progressing to improve quad activation for extension in stance once therapist initiates knee block. During second bout, colored circles placed eye level or higher to promote improved upright posture and forward gaze with patient able to identify 4/6 colors.   STS to stedy with maxA. Transfer to commode in steady 2+ for safety. Pt continent of bladder.  TotalA for peri care with +2 assist to maintain upright posture with facilitation for hip extension.   Pt sitting in TIS wc with seatbelt alarm on, call bell within reach, and daughter present in room.   Therapy Documentation Precautions:  Precautions Precautions: Fall Precaution Comments: L hemiparesis Restrictions Weight Bearing Restrictions: No  Pain: Pain Assessment Pain Scale: 0-10 Pain Score: 0-No pain Faces Pain Scale: No hurt   Therapy/Group: Individual Therapy  Daaiyah Baumert, SPT 06/16/2021, 12:09 PM

## 2021-06-16 NOTE — Progress Notes (Signed)
Physical Therapy Session Note  Patient Details  Name: Vickie Mckee MRN: [id number] Date of Birth: [birth year]  Today's Date: 06/17/2021 PT Individual Time:  -      Short Term Goals: Week 2:  PT Short Term Goal 1 (Week 2): Pt will perform supine<>sit with max assist of 1 consistently PT Short Term Goal 2 (Week 2): Pt will perform bed<>chair transfers with max assist of 1 consistently PT Short Term Goal 3 (Week 2): Pt will perform sit<>stands with max assist of 1 PT Short Term Goal 4 (Week 2): Pt will participate in body weight supported high intensity gait training reaching 24f   Skilled Therapeutic Interventions/Progress Updates:  Patient seated upright in TIS w/c on entrance to room. Patient alert and agreeable to PT session. Patient denied pain during session but continues to c/o sleepiness throughout. Co treat with PT and PT student for improved [place]  Therapeutic Activity: Transfers: Patient performed STS transfers throughout session with RUE forward support and Max A +2.  Provided verbal cues for sequence of correct technique in order to perform rise and descent with strength and control. VC throughout for pt to increase use of R side as she states that her L side "can't do it" and then decreases involvement of R side as well. Consistent vc required for R sided activation. Education provided to pt that "R side has to teach the L side what to do - like show and tell".   Neuromuscular Re-ed: NMR facilitated during session with focus on standing posture, standing balance, pelvic tilt, muscle activation. Pt guided during STS with cotreating therapist for rotating pelvis posteriorly during rise to stand and stance as well as providing facilitation to trunk for more upright posture into extension. During gait training on treadmill, tc provided to RLE in order to improve sequencing of step through as well as to increase step height/ length and speed for improved overall pattern.  NMR  performed for improvements in motor control and coordination, balance, sequencing, judgement, and self confidence/ efficacy in performing all aspects of mobility at highest level of independence.   Patient seated upright  in w/c at end of session with brakes locked, belt alarm set, and all needs within reach.     Therapy Documentation Precautions:  Precautions Precautions: Fall Precaution Comments: L hemiparesis Restrictions Weight Bearing Restrictions: No  Therapy/Group: Individual Therapy  JAlger SimonsPT, DPT 06/16/2021, 4:32 PM

## 2021-06-16 NOTE — Progress Notes (Signed)
Occupational Therapy Session Note  Patient Details  Name: Vickie Mckee MRN: [id number] Date of Birth: January 24, 1931  Today's Date: 06/16/2021 OT Individual Time: 1415-1455 OT Individual Time Calculation (min): 40 min    Short Term Goals: Week 2:  OT Short Term Goal 1 (Week 2): Pt will complete UB bathing sitting supported in wheelchair with min assist for 2 consecutive sessions. OT Short Term Goal 2 (Week 2): Pt will complete UB dressing with mod assist for donning a pullover shirt following hemi dressing techniques. OT Short Term Goal 3 (Week 2): Pt will complete squat pivot transfer to the drop arm commode with max assist.  Skilled Therapeutic Interventions/Progress Updates:    Pt received on toilet with stedy anterior, daughter assisting with peri hygiene seated (nursing assisted to toilet). Pt was able to participate in posterior hygiene with mod A overall. Mod A to stand from the toilet, pt fatiguing quickly and required heavy max A for subsequent stands to don brief and pants. Pt completed hand hygiene at the sink with mod facilitation to integrate the LUE into the task. Pt was taken to the gym via stedy for energy conservation. Blocked practice sit <> stand in the stedy with focus on standing tolerance and anterior pelvis weight shift. Pt returned to her room and from EOB donned pajama shirt with max A. Pajama pants donned supine rolling R and L with CGA for R and mod A for L. Pt was left supine with all needs met, bed alarm set.    Therapy Documentation Precautions:  Precautions Precautions: Fall Precaution Comments: L hemiparesis Restrictions Weight Bearing Restrictions: No   Therapy/Group: Individual Therapy  Curtis Sites 06/16/2021, 6:13 AM

## 2021-06-16 NOTE — Progress Notes (Signed)
PROGRESS NOTE   Subjective/Complaints:  Pt reports she's "too sleepy"- and needs help to wake up.  Denies tooth pain this AM- has resolved per pt.    ROS:  Impaired due to memory and sedation  Objective:   DG Swallowing Func-Speech Pathology  Result Date: 06/15/2021 Table formatting from the original result was not included. Objective Swallowing Evaluation: Type of Study: MBS-Modified Barium Swallow Study  Patient Details Name: Vickie Mckee MRN: [id number] Date of Birth: 02-15-1931 Today's Date: 06/15/2021 Time: SLP Start Time (ACUTE ONLY): 1330 -SLP Stop Time (ACUTE ONLY): [id number] SLP Time Calculation (min) (ACUTE ONLY): 25 min Past Medical History: Past Medical History: Diagnosis Date  Decreased sense of smell   and taste-negative CT scan-2011  Depression   Essential hypertension   GERD (gastroesophageal reflux disease)   Heart murmur   heard first time 4/12  History of mammogram   2010 and she does not want to repeat them anymore  HOCM (hypertrophic obstructive cardiomyopathy) ([place])   a. Dx by echo 05/2014.  Hypothyroidism   IBS (irritable bowel syndrome)   Impaired fasting glucose   Low back pain   Lumbar radiculopathy   with negative MRI (1991)  Postmenopausal   with osteopenia, bisphosphonates 1/05-1/11, improved osteopenia 4/12-repeat planned late 2015  Shingles   2015  Thyroid nodule   Vitamin D deficiency   repleted on weekly vitamin D 50000 iu Past Surgical History: Past Surgical History: Procedure Laterality Date  ABDOMINAL HYSTERECTOMY    BREAST BIOPSY    x2  CATARACT EXTRACTION    removal with IOL bilateral  GLAUCOMA SURGERY    laser  skin cancer removal    THYROIDECTOMY    subtotal HPI: 87 y.o. female with medical history significant of essential hypertension, hypothyroidism, gastroesophageal reflux disease, depression, vitamin D deficiency, history of HOCM and chronic low back pain; who presented to the hospital secondary to  left-sided weakness and dysarthria.  Patient is on clear about exact  Timing of initiation of her symptoms; she reported last felt normal after waking up this morning around 7 AM.  Patient reported ambulating as she normally does after waking up in the morning, and anywhere in between 8 and 9 she has no recollection of the events, family was not able to reach her and contacted EMS in the way to her house.  Patient was found on the floor demonstrating left-sided weakness, left lateralized gaze, vomiting content next to her and also dysarthria; MRI reveals "Acute right pontine infarct. Mild associated edema without mass effect. Multiple small remote bilateral cerebellar lacunar infarcts  Subjective: Nursing with pt when SLP arrived; coughing with medication administration with nectar-thickened liquids; changed to puree/whole meds Assessment / Plan / Recommendation CHL IP CLINICAL IMPRESSIONS 06/15/2021 Clinical Impression Patient presents with a mild oral and a mild-moderate pharyngeal phase dysphagia that is improved as compared to most recent MBS on 8/9. during oral phase, patient exhibited a mild delay in anterior to posterior transit of liquid and solid boluses and premature spillage of thin and nectar thick liquids. During pharyngeal phase, patient exhibited two instances of very trace penetration (PAS 2, 3) during the swallow with thin liquids but without aspiration and  with eventual clearance of penetrate. Swallow initiation was delayed to level of pyriform sinus with thin liquids and nectar thick liquids and delayed to level of vallecular sinus with puree solids and regular solids. Trace vallecular sinus residuals were observed after initial swallow with regular solids, but cleared fully after subsequent swallows. No significant pharyngeal residuals observed with thin liquids, puree soilds, nectar thick liquids. SLP is recommending to upgrade patient's current diet to Dys 2 (fine chop), thin liquids. SLP Visit  Diagnosis Dysphagia, oropharyngeal phase (R13.12) Attention and concentration deficit following -- Frontal lobe and executive function deficit following -- Impact on safety and function Mild aspiration risk   CHL IP TREATMENT RECOMMENDATION 05/30/2021 Treatment Recommendations Therapy as outlined in treatment plan below   Prognosis 05/30/2021 Prognosis for Safe Diet Advancement Good Barriers to Reach Goals Cognitive deficits Barriers/Prognosis Comment -- CHL IP DIET RECOMMENDATION 06/15/2021 SLP Diet Recommendations Dysphagia 2 (Fine chop) solids;Thin liquid Liquid Administration via Cup;Straw Medication Administration Whole meds with puree Compensations Minimize environmental distractions;Slow rate;Small sips/bites Postural Changes Seated upright at 90 degrees;Remain semi-upright after after feeds/meals (Comment)   CHL IP OTHER RECOMMENDATIONS 06/15/2021 Recommended Consults -- Oral Care Recommendations Oral care BID Other Recommendations --   CHL IP FOLLOW UP RECOMMENDATIONS 05/30/2021 Follow up Recommendations 24 hour supervision/assistance;Other (comment)   CHL IP FREQUENCY AND DURATION 05/30/2021 Speech Therapy Frequency (ACUTE ONLY) min 2x/week Treatment Duration --      CHL IP ORAL PHASE 06/15/2021 Oral Phase Impaired Oral - Pudding Teaspoon -- Oral - Pudding Cup -- Oral - Honey Teaspoon -- Oral - Honey Cup -- Oral - Nectar Teaspoon NT Oral - Nectar Cup WFL Oral - Nectar Straw NT Oral - Thin Teaspoon Premature spillage Oral - Thin Cup Premature spillage Oral - Thin Straw Premature spillage Oral - Puree Weak lingual manipulation;Reduced posterior propulsion Oral - Mech Soft NT Oral - Regular Weak lingual manipulation;Reduced posterior propulsion Oral - Multi-Consistency NT Oral - Pill WFL Oral Phase - Comment --  CHL IP PHARYNGEAL PHASE 06/15/2021 Pharyngeal Phase Impaired Pharyngeal- Pudding Teaspoon -- Pharyngeal -- Pharyngeal- Pudding Cup -- Pharyngeal -- Pharyngeal- Honey Teaspoon -- Pharyngeal -- Pharyngeal- Honey  Cup -- Pharyngeal -- Pharyngeal- Nectar Teaspoon NT Pharyngeal -- Pharyngeal- Nectar Cup Delayed swallow initiation-pyriform sinuses Pharyngeal Material does not enter airway Pharyngeal- Nectar Straw -- Pharyngeal -- Pharyngeal- Thin Teaspoon Delayed swallow initiation-pyriform sinuses Pharyngeal Material does not enter airway Pharyngeal- Thin Cup Delayed swallow initiation-pyriform sinuses;Penetration/Aspiration during swallow Pharyngeal Material enters airway, remains ABOVE vocal cords and not ejected out Pharyngeal- Thin Straw Delayed swallow initiation-pyriform sinuses;Penetration/Aspiration during swallow Pharyngeal Material enters airway, remains ABOVE vocal cords then ejected out Pharyngeal- Puree Delayed swallow initiation-vallecula;Pharyngeal residue - valleculae Pharyngeal -- Pharyngeal- Mechanical Soft NT Pharyngeal -- Pharyngeal- Regular Delayed swallow initiation-vallecula;Pharyngeal residue - valleculae Pharyngeal -- Pharyngeal- Multi-consistency NT Pharyngeal -- Pharyngeal- Pill WFL Pharyngeal -- Pharyngeal Comment --  CHL IP CERVICAL ESOPHAGEAL PHASE 06/15/2021 Cervical Esophageal Phase WFL Pudding Teaspoon -- Pudding Cup -- Honey Teaspoon -- Honey Cup -- Nectar Teaspoon -- Nectar Cup -- Nectar Straw -- Thin Teaspoon -- Thin Cup -- Thin Straw -- Puree -- Mechanical Soft -- Regular -- Multi-consistency -- Pill -- Cervical Esophageal Comment -- Sonia Baller, MA, CCC-SLP Speech Therapy             Recent Labs    06/16/21 0455  WBC 10.7*  HGB 13.4  HCT 40.3  PLT 250    Recent Labs    06/15/21 0514 06/16/21 0455  NA  --  137  K  --  4.2  CL  --  104  CO2  --  27  GLUCOSE  --  105*  BUN  --  33*  CREATININE 0.98 1.03*  CALCIUM  --  9.1    Intake/Output Summary (Last 24 hours) at 06/16/2021 1358 Last data filed at 06/15/2021 1700 Gross per 24 hour  Intake 177 ml  Output --  Net 177 ml        Physical Exam: Vital Signs Blood pressure 137/65, pulse 89, temperature 98.7 F  (37.1 C), resp. rate 17, height '5\' 2"'$  (1.575 m), weight 53.5 kg, SpO2 98 %.     General: more sleepy than normal; hard to awaken; supine in bed; denies tooth pain;  NAD HENT: conjugate gaze; oropharynx moist CV: regular rate; no JVD Pulmonary: CTA B/L; no W/R/R- good air movement GI: soft, NT, ND, (+)BS Psychiatric: sedated- at 7:30am Neurological: Ox1  Motor: LUE: 0/5 proximal distal, except trace Left triceps  LLE: Hip flexion, knee extension 2-/5, ankle dorsiflexion 0/5 Absent Triple flexor response LLE  Sensation intact LT on left side and right  Dysarthria  Assessment/Plan: 1. Functional deficits which require 3+ hours per day of interdisciplinary therapy in a comprehensive inpatient rehab setting. Physiatrist is providing close team supervision and 24 hour management of active medical problems listed below. Physiatrist and rehab team continue to assess barriers to discharge/monitor patient progress toward functional and medical goals  Care Tool:  Bathing    Body parts bathed by patient: Left arm, Right upper leg, Left upper leg, Abdomen, Chest, Face, Front perineal area   Body parts bathed by helper: Buttocks, Right lower leg, Left lower leg, Right arm     Bathing assist Assist Level: Maximal Assistance - Patient 24 - 49%     Upper Body Dressing/Undressing Upper body dressing   What is the patient wearing?: Pull over shirt    Upper body assist Assist Level: Moderate Assistance - Patient 50 - 74%    Lower Body Dressing/Undressing Lower body dressing      What is the patient wearing?: Incontinence brief, Pants     Lower body assist Assist for lower body dressing: 2 Helpers     Toileting Toileting    Toileting assist Assist for toileting: 2 Helpers     Transfers Chair/bed transfer  Transfers assist     Chair/bed transfer assist level: Maximal Assistance - Patient 25 - 49% (squat pivot)     Locomotion Ambulation   Ambulation assist    Ambulation activity did not occur: Safety/medical concerns  Assist level: Total Assistance - Patient < 25% Assistive device: Lite Gait Max distance: 3f   Walk 10 feet activity   Assist  Walk 10 feet activity did not occur: Safety/medical concerns        Walk 50 feet activity   Assist Walk 50 feet with 2 turns activity did not occur: Safety/medical concerns         Walk 150 feet activity   Assist Walk 150 feet activity did not occur: Safety/medical concerns         Walk 10 feet on uneven surface  activity   Assist Walk 10 feet on uneven surfaces activity did not occur: Safety/medical concerns         Wheelchair     Assist Is the patient using a wheelchair?: Yes Type of Wheelchair: Manual Wheelchair activity did not occur: Safety/medical concerns         Wheelchair 50 feet with 2  turns activity    Assist    Wheelchair 50 feet with 2 turns activity did not occur: Safety/medical concerns       Wheelchair 150 feet activity     Assist  Wheelchair 150 feet activity did not occur: Safety/medical concerns       Blood pressure 137/65, pulse 89, temperature 98.7 F (37.1 C), resp. rate 17, height '5\' 2"'$  (1.575 m), weight 53.5 kg, SpO2 98 %.      Medical Problem List and Plan: 1.   Left-sided hemiparesis with dysarthria/dysphagia secondary to right pontine infarction as well as multiple small remote bilateral cerebellar lacunar infarcts Continue CIR  Cont PRAFO, WHO nightly- team conf in am  Continue CIR- PT, OT and SLP 2.  Impaired mobility: Continue Lovenox for DVT prophylaxis             -antiplatelet therapy: Aspirin 81 mg daily and Plavix 75 mg day x1 month then Plavix alone Flexor withdrawal spasms LLE-zanaflex at night but may be causing am fatigue will d/c 3. Pain Management/chronic back pain: Tylenol as needed  8/26- tylenol prn for tooth pain- has resolved- con't regimen 4. Depression: Continue Zoloft 100 mg daily, Aricept 5  mg nightly             -antipsychotic agents: N/A 5. Neuropsych: This patient is capable of making decisions on her own behalf. 6. Skin/Wound Care: Routine skin checks 7. Fluids/Electrolytes/Nutrition: Routine in and outs 8. Post stroke dysphagia.  Dysphagia #1 nectar thick liquids.  Follow-up speech therapy.  Monitor hydration  8/26- Cr 1.03- very slightly up, and BUN 33, however now on D2 thin diet- will recheck Monday   Advance diet as tolerated 9.  Hypertension.  Patient on verapamil 240 mg daily prior to admission as well as HCTZ 25 mg daily.     Vitals:   06/16/21 0525 06/16/21 1313  BP: 131/65 137/65  Pulse: 77 89  Resp: 20 17  Temp: 98.6 F (37 C) 98.7 F (37.1 C)  SpO2: 95% 98%   8/25- BP controlled- con't regimen  10.  Hyperlipidemia.  Lipitor 11.  Hypothyroidism.  TSH 2.093 .Continue Synthroid 12.  Constipation.  MiraLAX scheduled daily.  Senokot S2 tablets nightly  13. Hypokalemia: Supplementation started   8/25- will recheck labs- has been 10 days- in AM  8/26- K+ 4.2- con't to monitor 14. Crackles and coughing with food intake: placed nursing order for must be up with meals, resolved   Chest x-ray unremarkable for acute process- per SLP fluids downgraded to Honey thick with repeat MBS this wk?  8/25- Pt now on D2 with thin liquids per orders- just upgraded today- so will recheck labs in AM  8/26- BUN 33- will push fluids and recheck Monday 15.  Hypoalbuminemia  Supplement initiated on 8/13 16.  Cognitive deficits post stroke - has evidence of small vessel disease on imaging as well as the new pontine infarct   8/25- con't memory aids on wall.  17. R upper tooth pain-  8/25- con't tylenol prn 18. Sedation  8/26- will stop melatonin- if this doesn't help, might benefit from amantadine.    LOS: 15 days A FACE TO FACE EVALUATION WAS PERFORMED  Sagar Tengan 06/16/2021, 1:58 PM

## 2021-06-16 NOTE — Progress Notes (Signed)
Occupational Therapy Weekly Progress Note  Patient Details  Name: Vickie Mckee MRN: [id number] Date of Birth: [birth year]  Beginning of progress report period: June 10, 2021 End of progress report period: June 16, 2021  Today's Date: 06/16/2021 OT Individual Time: 1003-1104 OT Individual Time Calculation (min): 61 min    Patient has met 1 of 3 short term goals.    Pt is making slow but steady progress with OT at this time.  Currently, she completes UB bathing with min assist and UB dressing at max assist.  LB bathing and dressing are total +2 (pt 25%) sit to stand stand and total assist when completed supine rolling in bed.  Sitting balance is at min guard statically with supervision for short periods of time.  Mod to max is still needed for dynamic sitting secondary to posterior LOB as well as LOB to the left.  LUE demonstrates only trace shoulder movement currently as Brunnstrum stage II with stage I flaccidity in the hand.  Max hand over hand assist is needed to integrate the LUE into functional tasks as a stabilizer.  Vickie Mckee continues to need max assist for squat pivot transfers to different surfaces with total +2 (pt 25%)for standing to complete toilet hygiene and clothing management.  Endurance continues to be limiting with her stating frequently that she is tired and sleepy.  Encouragement is given to help her stay out of the bed for periods of time in order to increase her endurance.  Currently, progress remains slower than originally expected.  Will downgrade goals to more mod assist level to reflect this.  Recommend continued CIR level therapy to work toward min to mod assist level goals.      Patient continues to demonstrate the following deficits: muscle weakness and muscle paralysis, impaired timing and sequencing, unbalanced muscle activation, and decreased coordination, decreased attention to left, decreased memory, and decreased sitting balance, decreased standing balance,  decreased postural control, hemiplegia, and decreased balance strategies and therefore will continue to benefit from skilled OT intervention to enhance overall performance with BADL.  Patient not progressing toward long term goals.  See goal revision..  Continue plan of care.  OT Short Term Goals Week 3:  OT Short Term Goal 1 (Week 3): Pt will complete UB dressing with mod assist for donning a pullover shirt following hemi dressing techniques. OT Short Term Goal 2 (Week 3): Pt will complete LB bathing with max assist sit to stand. OT Short Term Goal 3 (Week 3): Pt will complete sit to stand for toileting tasks with mod assist.  Skilled Therapeutic Interventions/Progress Updates:    Pt worked on bathing and dressing sit to stand during session.  She was able to transfer from supine to sit EOB with max assist.  Static sitting balance with close supervision with dynamic sitting during ADL tasks at min to occasional mod assist secondary to forward and left lean.  She was able to complete UB bathing with min assist and LB bathing at total +2 (pt 25%).  Max demonstrational cueing for upright posture secondary to posterior pelvic tilt and increased cervical flexion.  She was able to doff and donn her pullover shirts with mod assist following hemi techniques.  She was able to donn LB clothing with total +2 (pt 25%) sit to stand.  Increased trunk flexion with decreased ability to achieve upright posture with hip extension.  Max facilitation was needed to obtain left knee extension as well.  She needed total assist for donning  her TEDs and shoes with max assist for squat pivot transfer to the wheelchair from the EOB.  Pt was left sitting up in preparation for PT session coming in next.  Call button and safety belt in place.    Therapy Documentation Precautions:  Precautions Precautions: Fall Precaution Comments: L hemiparesis Restrictions Weight Bearing Restrictions: No  Pain: Pain Assessment Pain Scale:  0-10 Pain Score: 0-No pain Faces Pain Scale: No hurt ADL: See Care Tool Section for some details of mobility and selfcare   Therapy/Group: Individual Therapy  Eldon Zietlow OTR/L 06/16/2021, 12:19 PM

## 2021-06-16 NOTE — NC FL2 (Addendum)
[place] LEVEL OF CARE SCREENING TOOL     IDENTIFICATION  Patient Name: Vickie Mckee Birthdate: [birth year] Sex: female Admission Date (Current Location): 06/01/2021  [facility] and [place] Number:  [employer] and Address:  [facility], [place] [address], [place], [place] [address]      Provider Number:    Attending Physician Name and Address:  Charlett Blake, MD  Relative Name and Phone Number:  Butch Penny ([phone]    Current Level of Care: Hospital Recommended Level of Care: [place] Prior Approval Number:    Date Approved/Denied:   PASRR Number:  [id number]  Discharge Plan: SNF    Current Diagnoses: Patient Active Problem List   Diagnosis Date Noted   Hypoalbuminemia due to protein-calorie malnutrition [facility])    Hypokalemia    Dysphagia, post-stroke    Right pontine cerebrovascular accident ([place]) 06/01/2021   Left hemiplegia ([place]) 06/01/2021   Ischemic stroke/Acute right pontine infarct.  05/29/2021   Essential hypertension    Hypothyroidism    HOCM (hypertrophic obstructive cardiomyopathy) ([place]) 07/23/2014   Weakness of both legs 07/23/2014   Heart murmur     Orientation RESPIRATION BLADDER Height & Weight     Self, Place  Normal Incontinent Weight: 117 lb 15.1 oz (53.5 kg) Height:  '5\' 2"'$  (157.5 cm)  BEHAVIORAL SYMPTOMS/MOOD NEUROLOGICAL BOWEL NUTRITION STATUS      Incontinent Diet (Dys 1 textures, honey thick - sup A)  AMBULATORY STATUS COMMUNICATION OF NEEDS Skin   Limited Assist Verbally Normal                       Personal Care Assistance Level of Assistance  Bathing, Feeding, Dressing Bathing Assistance: Limited assistance Feeding assistance: Limited assistance Dressing Assistance: Limited assistance     Functional Limitations Info             SPECIAL CARE FACTORS FREQUENCY  PT (By licensed PT), OT (By licensed OT), Speech therapy     PT Frequency: 5x a  week OT Frequency: 5x a week     Speech Therapy Frequency: 5x a week      Contractures      Additional Factors Info  Code Status Code Status Info: DNR             Current Medications (06/16/2021):  This is the current hospital active medication list Current Facility-Administered Medications  Medication Dose Route Frequency Provider Last Rate Last Admin   (feeding supplement) PROSource Plus liquid 30 mL  30 mL Oral BID BM Jamse Arn, MD   30 mL at 06/16/21 1000   acetaminophen (TYLENOL) tablet 650 mg  650 mg Oral Q4H PRN Cathlyn Parsons, PA-C   650 mg at 06/11/21 2151   Or   acetaminophen (TYLENOL) 160 MG/5ML solution 650 mg  650 mg Per Tube Q4H PRN Angiulli, Lavon Paganini, PA-C       Or   acetaminophen (TYLENOL) suppository 650 mg  650 mg Rectal Q4H PRN Angiulli, Lavon Paganini, PA-C       ascorbic acid (VITAMIN C) tablet 500 mg  500 mg Oral Daily Cathlyn Parsons, PA-C   500 mg at 06/16/21 0800   aspirin EC tablet 81 mg  81 mg Oral Q breakfast Cathlyn Parsons, PA-C   81 mg at 06/16/21 0800   atorvastatin (LIPITOR) tablet 40 mg  40 mg Oral Daily Cathlyn Parsons, PA-C   40 mg at 06/16/21  0800   clopidogrel (PLAVIX) tablet 75 mg  75 mg Oral Daily Cathlyn Parsons, PA-C   75 mg at 06/16/21 0800   donepezil (ARICEPT) tablet 5 mg  5 mg Oral QHS AngiulliLavon Paganini, PA-C   5 mg at 06/15/21 2155   enoxaparin (LOVENOX) injection 40 mg  40 mg Subcutaneous Q24H Cathlyn Parsons, PA-C   40 mg at 06/15/21 1624   famotidine (PEPCID) tablet 40 mg  40 mg Oral Daily Cathlyn Parsons, PA-C   40 mg at 06/16/21 0800   feeding supplement (ENSURE ENLIVE / ENSURE PLUS) liquid 237 mL  237 mL Oral BID BM Kirsteins, Luanna Salk, MD   237 mL at 06/16/21 1000   levothyroxine (SYNTHROID) tablet 75 mcg  75 mcg Oral QAC breakfast Cathlyn Parsons, PA-C   75 mcg at 06/16/21 [id number]   polyethylene glycol (MIRALAX / GLYCOLAX) packet 17 g  17 g Oral Daily Cathlyn Parsons, PA-C   17 g at 06/16/21 0800    potassium chloride 20 MEQ/15ML (10%) solution 10 mEq  10 mEq Oral Daily Charlett Blake, MD   10 mEq at 06/16/21 0800   senna-docusate (Senokot-S) tablet 2 tablet  2 tablet Oral QHS Cathlyn Parsons, PA-C   2 tablet at 06/15/21 2155   sertraline (ZOLOFT) tablet 100 mg  100 mg Oral Daily Cathlyn Parsons, PA-C   100 mg at 06/16/21 0800   zinc sulfate capsule 220 mg  220 mg Oral Daily AngiulliLavon Paganini, PA-C   220 mg at 06/16/21 0800     Discharge Medications: Please see discharge summary for a list of discharge medications.  Relevant Imaging Results:  Relevant Lab Results:   Additional Information    [name]

## 2021-06-17 NOTE — Progress Notes (Signed)
Occupational Therapy Session Note  Patient Details  Name: Vickie Mckee MRN: [id number] Date of Birth: 03/05/1931  Today's Date: 06/17/2021 OT Group Time:  - 60 minutes missed     Skilled Therapeutic Interventions/Progress Updates:    Per nursing, pt had a HA and refused to get OOB to participate in group today. 60 minutes missed.    Therapy Documentation Precautions:  Precautions Precautions: Fall Precaution Comments: L hemiparesis Restrictions Weight Bearing Restrictions: No  ADL: ADL Eating: Moderate assistance Where Assessed-Eating: Bed level Grooming: Maximal assistance Where Assessed-Grooming: Edge of bed Upper Body Bathing: Moderate assistance Where Assessed-Upper Body Bathing: Edge of bed Lower Body Bathing: Dependent Where Assessed-Lower Body Bathing: Bed level, Edge of bed Upper Body Dressing: Dependent Where Assessed-Upper Body Dressing: Edge of bed Lower Body Dressing: Dependent Where Assessed-Lower Body Dressing: Edge of bed Toileting: Dependent Where Assessed-Toileting: Bedside Commode Toilet Transfer: Dependent Toilet Transfer Method: Engineer, water: Engineer, technical sales Transfer: Not assessed Gaffer Transfer: Not assessed     Therapy/Group: Group Therapy  [name] A [name] 06/17/2021, 6:25 PM

## 2021-06-17 NOTE — Progress Notes (Signed)
PROGRESS NOTE   Subjective/Complaints:  Pt reports much more awake- slept well, but more awake Needs to void/go to bathroom- called nurse for her.  Pt denies constipation and pain of any kind.   ROS:  Impaired due to cognition  Objective:   No results found. Recent Labs    06/16/21 0455  WBC 10.7*  HGB 13.4  HCT 40.3  PLT 250    Recent Labs    06/15/21 0514 06/16/21 0455  NA  --  137  K  --  4.2  CL  --  104  CO2  --  27  GLUCOSE  --  105*  BUN  --  33*  CREATININE 0.98 1.03*  CALCIUM  --  9.1    Intake/Output Summary (Last 24 hours) at 06/17/2021 1026 Last data filed at 06/16/2021 1814 Gross per 24 hour  Intake 200 ml  Output --  Net 200 ml        Physical Exam: Vital Signs Blood pressure (!) 128/52, pulse 73, temperature 97.6 F (36.4 C), resp. rate 19, height '5\' 2"'$  (1.575 m), weight 53.5 kg, SpO2 94 %.      General: awake, alert, appropriate, much more awake! NAD HENT: conjugate gaze; oropharynx moist CV: regular rate; no JVD Pulmonary: CTA B/L; no W/R/R- good air movement GI: soft, NT, ND, (+)BS Psychiatric: appropriate; more interactive, more awake Neurological: Ox1  Motor: LUE: 0/5 proximal distal, except trace Left triceps  LLE: Hip flexion, knee extension 2-/5, ankle dorsiflexion 0/5 Absent Triple flexor response LLE  Sensation intact LT on left side and right  Dysarthria  Assessment/Plan: 1. Functional deficits which require 3+ hours per day of interdisciplinary therapy in a comprehensive inpatient rehab setting. Physiatrist is providing close team supervision and 24 hour management of active medical problems listed below. Physiatrist and rehab team continue to assess barriers to discharge/monitor patient progress toward functional and medical goals  Care Tool:  Bathing    Body parts bathed by patient: Left arm, Right upper leg, Left upper leg, Abdomen, Chest, Face, Front  perineal area   Body parts bathed by helper: Buttocks, Right lower leg, Left lower leg, Right arm     Bathing assist Assist Level: Maximal Assistance - Patient 24 - 49%     Upper Body Dressing/Undressing Upper body dressing   What is the patient wearing?: Pull over shirt    Upper body assist Assist Level: Moderate Assistance - Patient 50 - 74%    Lower Body Dressing/Undressing Lower body dressing      What is the patient wearing?: Incontinence brief, Pants     Lower body assist Assist for lower body dressing: 2 Helpers     Toileting Toileting    Toileting assist Assist for toileting: 2 Helpers     Transfers Chair/bed transfer  Transfers assist     Chair/bed transfer assist level: Maximal Assistance - Patient 25 - 49% (squat pivot)     Locomotion Ambulation   Ambulation assist   Ambulation activity did not occur: Safety/medical concerns  Assist level: Total Assistance - Patient < 25% Assistive device: Lite Gait Max distance: 38f   Walk 10 feet activity   Assist  Walk 10 feet activity did not occur: Safety/medical concerns        Walk 50 feet activity   Assist Walk 50 feet with 2 turns activity did not occur: Safety/medical concerns         Walk 150 feet activity   Assist Walk 150 feet activity did not occur: Safety/medical concerns         Walk 10 feet on uneven surface  activity   Assist Walk 10 feet on uneven surfaces activity did not occur: Safety/medical concerns         Wheelchair     Assist Is the patient using a wheelchair?: Yes Type of Wheelchair: Manual Wheelchair activity did not occur: Safety/medical concerns         Wheelchair 50 feet with 2 turns activity    Assist    Wheelchair 50 feet with 2 turns activity did not occur: Safety/medical concerns       Wheelchair 150 feet activity     Assist  Wheelchair 150 feet activity did not occur: Safety/medical concerns       Blood pressure  (!) 128/52, pulse 73, temperature 97.6 F (36.4 C), resp. rate 19, height '5\' 2"'$  (1.575 m), weight 53.5 kg, SpO2 94 %.      Medical Problem List and Plan: 1.   Left-sided hemiparesis with dysarthria/dysphagia secondary to right pontine infarction as well as multiple small remote bilateral cerebellar lacunar infarcts Continue CIR  Cont PRAFO, WHO nightly Continue CIR- PT, OT and SLP 2.  Impaired mobility: Continue Lovenox for DVT prophylaxis             -antiplatelet therapy: Aspirin 81 mg daily and Plavix 75 mg day x1 month then Plavix alone Flexor withdrawal spasms LLE-zanaflex at night but may be causing am fatigue will d/c 3. Pain Management/chronic back pain: Tylenol as needed  8/26- tylenol prn for tooth pain- has resolved- con't regimen  8/27- denies pain- con't tylenol prn 4. Depression: Continue Zoloft 100 mg daily, Aricept 5 mg nightly             -antipsychotic agents: N/A 5. Neuropsych: This patient is capable of making decisions on her own behalf. 6. Skin/Wound Care: Routine skin checks 7. Fluids/Electrolytes/Nutrition: Routine in and outs 8. Post stroke dysphagia.  Dysphagia #1 nectar thick liquids.  Follow-up speech therapy.  Monitor hydration  8/26- Cr 1.03- very slightly up, and BUN 33, however now on D2 thin diet- will recheck Monday   Advance diet as tolerated 9.  Hypertension.  Patient on verapamil 240 mg daily prior to admission as well as HCTZ 25 mg daily.     Vitals:   06/16/21 1925 06/17/21 0431  BP: (!) 128/55 (!) 128/52  Pulse: 64 73  Resp: 16 19  Temp: 98 F (36.7 C) 97.6 F (36.4 C)  SpO2: 94% 94%   8/27- BP controlled well- con't regimen 10.  Hyperlipidemia.  Lipitor 11.  Hypothyroidism.  TSH 2.093 .Continue Synthroid 12.  Constipation.  MiraLAX scheduled daily.  Senokot S2 tablets nightly  13. Hypokalemia: Supplementation started   8/25- will recheck labs- has been 10 days- in AM  8/26- K+ 4.2- con't to monitor 14. Crackles and coughing with food  intake: placed nursing order for must be up with meals, resolved   Chest x-ray unremarkable for acute process- per SLP fluids downgraded to Honey thick with repeat MBS this wk?  8/25- Pt now on D2 with thin liquids per orders- just upgraded today- so will recheck labs  in AM  8/27- labs Monday- Bun was 33, but just upgraded diet to D2 thin.  15.  Hypoalbuminemia  Supplement initiated on 8/13 16.  Cognitive deficits post stroke - has evidence of small vessel disease on imaging as well as the new pontine infarct   8/25- con't memory aids on wall.  17. R upper tooth pain-  8/25- con't tylenol prn 18. Sedation  8/26- will stop melatonin- if this doesn't help, might benefit from amantadine.    8/27- Pt much more awake- will wait on Amantadine.    LOS: 16 days A FACE TO FACE EVALUATION WAS PERFORMED  Press [place] 06/17/2021, 10:26 AM

## 2021-06-17 NOTE — Progress Notes (Signed)
Physical Therapy Session Note  Patient Details  Name: Vickie Mckee MRN: [id number] Date of Birth: 1931/06/02  Today's Date: 06/17/2021 PT Individual Time: [id number] PT Individual Time Calculation (min): 70 min   Short Term Goals: Week 2:  PT Short Term Goal 1 (Week 2): Pt will perform supine<>sit with max assist of 1 consistently PT Short Term Goal 2 (Week 2): Pt will perform bed<>chair transfers with max assist of 1 consistently PT Short Term Goal 3 (Week 2): Pt will perform sit<>stands with max assist of 1 PT Short Term Goal 4 (Week 2): Pt will participate in body weight supported high intensity gait training reaching 19f  Skilled Therapeutic Interventions/Progress Updates:    Pt received in bathroom with nursing staff assisting pt with toileting - pt agreeable to therapy session therefore therapist assumed care of patient. Sit>stand BSC over toilet>stedy with mod/max assist of 1 while +2 assist completed LB clothing management dependent assist. Stedy transfer back to w/c with sit>stand from stedy seat with mod assist for lifting/balance and managing safety of L UE.   Pt's daughters report pt has had significant improvement in alertness today and has been up in w/c more participating in activities with them (ex: crossword puzzles and wheelchair ride outside). Therapist also recognizes patient with increased alertness and decreased reports of fatigue - overall appears to have increased energy levels.  Transported to/from gym in w/c for time management and energy conservation. Donned L LE ankle DF assist ACE wrap. Donned ACE wrap to L UE for support during gait training. Sit>stand w/c>R UE support on litegait with max assist of 1 for lifting to stand, blocking L knee, and heavy manual facilitation for increased hip extension and anterior weight shift (as pt tends to lean heavily posteriorly) while +2 assist donned litegait harness total assist. Gait training [id number] in  litegait with partial body weight support (BWS) with +2 managing litegait - requires max/total assist for L LE management - pt able to advance L LE during swing ~30-40% of the wayrequiring max assist to achieve increased step length for reciprocal stepping pattern - requires max assist to promote increased L weight shift and increased L LE hip/knee extension during stance - cuing throughout for improved upright posture to promote trunk/hip extension (no hip tension cables utilized). Doffed harness as described above.  Transported back to room and left seated in w/c with needs in reach, seat belt alarm on, and L UE supported on pillows.   Therapy Documentation Precautions:  Precautions Precautions: Fall Precaution Comments: L hemiparesis Restrictions Weight Bearing Restrictions: No   Pain:  No reports of pain throughout session.   Therapy/Group: Individual Therapy  CarTawana ScalePT, DPT, NCS, CSRS 06/17/2021, 3:29 PM

## 2021-06-17 NOTE — Plan of Care (Signed)
  Problem: RH BOWEL ELIMINATION Goal: RH STG MANAGE BOWEL WITH ASSISTANCE Description: STG Manage Bowel with min Assistance. Outcome: Not Progressing; incontinence   Problem: RH BLADDER ELIMINATION Goal: RH STG MANAGE BLADDER WITH ASSISTANCE Description: STG Manage Bladder With min Assistance Outcome: Not Progressing; incontinence   Problem: RH KNOWLEDGE DEFICIT Goal: RH STG INCREASE KNOWLEDGE OF STROKE PROPHYLAXIS Description: Patient will demonstrate knowledge of medication management of secondary stoke prevention medications with educational materials and handouts provided by staff independently at discharge. Outcome: Not Progressing; confused at times

## 2021-06-17 NOTE — Progress Notes (Signed)
Speech Language Pathology Daily Session Note  Patient Details  Name: Vickie Mckee MRN: [id number] Date of Birth: [birth year]  Today's Date: 06/17/2021 SLP Individual Time: [id number] SLP Individual Time Calculation (min): 45 min  Short Term Goals: Week 3: SLP Short Term Goal 1 (Week 3): Patient will demonstrate orientation x4 with sup A verbal cues to refer to compensatory aids SLP Short Term Goal 2 (Week 3): Patient will demonstrate problem solving for basic tasks with sup verbal cues SLP Short Term Goal 3 (Week 3): Patient will tolerate current diet with minimal-to-no overt s/sx of aspiration with min A verbal cues for use of swallowing compensatory strategies SLP Short Term Goal 4 (Week 3): Patient will sustain attention for 8 minute intervals with min A verbal redirection SLP Short Term Goal 5 (Week 3): Patient will retain simple novel information following 3 minute delay with 75% accuracy and min A verbal cues  Skilled Therapeutic Interventions: Pt seen for skilled ST with focus on swallowing and cognitive goals. SLP providing setup A for AM meal of Dys 2/thin. Pt consuming Dys 2 textures with minimal pocketing in L buccal area, effectively cleared with cued liquid/puree wash and lingual sweep. Pt requiring mod A cues throughout meal to recall and utilize safe swallow strategies, primarily not talking when eating. Pt benefiting from cues to maintain attention to meal, often looking around room and reading signs repeatedly. Pt independent with small sips however noted s/s aspiration following ~25% of thin liquid intake this date. RN sent this SLP a secure message last night that pt was coughing with thin liquids and ice cream last night. Pt primarily demonstrates s/s aspiration when attention to drinking is diminished. With verbal cues to focus on effortful swallow during intake, s/s aspiration were eliminated. SLP facilitating simple orientation task by providing mod A cues to scan environment  to increase recall and understanding of temporal concepts and current medical dx. Carryover of information <3 minutes. Pt left in bed after meal with alarm set and all needs within reach. Cont ST POC.   Pain Pain Assessment Pain Scale: 0-10 Pain Score: 0-No pain  Therapy/Group: Individual Therapy  Dewaine Conger 06/17/2021, 8:52 AM

## 2021-06-18 MED ORDER — QUETIAPINE FUMARATE 25 MG PO TABS
12.5000 mg | ORAL_TABLET | Freq: Every evening | ORAL | Status: DC | PRN
  Administered 2021-06-18: 12.5 mg via ORAL
  Filled 2021-06-18: qty 1

## 2021-06-18 NOTE — Progress Notes (Signed)
PROGRESS NOTE   Subjective/Complaints:  Per staff, pt was up yelling most of night- got too sedated when gave melatonin 3 mg QHS, so will add Seroquel 12.5 mg QHS prn just in case needs it.   Pt says no issues.   ROS: impaired due to sedation/cognition  Objective:   No results found. Recent Labs    06/16/21 0455  WBC 10.7*  HGB 13.4  HCT 40.3  PLT 250    Recent Labs    06/16/21 0455  NA 137  K 4.2  CL 104  CO2 27  GLUCOSE 105*  BUN 33*  CREATININE 1.03*  CALCIUM 9.1    Intake/Output Summary (Last 24 hours) at 06/18/2021 1405 Last data filed at 06/18/2021 0945 Gross per 24 hour  Intake 472 ml  Output --  Net 472 ml        Physical Exam: Vital Signs Blood pressure 140/66, pulse 74, temperature 97.6 F (36.4 C), resp. rate 18, height '5\' 2"'$  (1.575 m), weight 53.5 kg, SpO2 98 %.       General: asleep- woke briefly- was up all night per staff;  NAD HENT: conjugate gaze; oropharynx moist CV: regular rate; no JVD Pulmonary: CTA B/L; no W/R/R- good air movement GI: soft, NT, ND, (+)BS Psychiatric: sedated/sleepy Neurological: Ox1   Motor: LUE: 0/5 proximal distal, except trace Left triceps  LLE: Hip flexion, knee extension 2-/5, ankle dorsiflexion 0/5 Absent Triple flexor response LLE  Sensation intact LT on left side and right  Dysarthria  Assessment/Plan: 1. Functional deficits which require 3+ hours per day of interdisciplinary therapy in a comprehensive inpatient rehab setting. Physiatrist is providing close team supervision and 24 hour management of active medical problems listed below. Physiatrist and rehab team continue to assess barriers to discharge/monitor patient progress toward functional and medical goals  Care Tool:  Bathing    Body parts bathed by patient: Left arm, Right upper leg, Left upper leg, Abdomen, Chest, Face, Front perineal area   Body parts bathed by helper:  Buttocks, Right lower leg, Left lower leg, Right arm     Bathing assist Assist Level: Maximal Assistance - Patient 24 - 49%     Upper Body Dressing/Undressing Upper body dressing   What is the patient wearing?: Pull over shirt    Upper body assist Assist Level: Moderate Assistance - Patient 50 - 74%    Lower Body Dressing/Undressing Lower body dressing      What is the patient wearing?: Incontinence brief, Pants     Lower body assist Assist for lower body dressing: 2 Helpers     Toileting Toileting    Toileting assist Assist for toileting: 2 Helpers     Transfers Chair/bed transfer  Transfers assist     Chair/bed transfer assist level: Maximal Assistance - Patient 25 - 49% (squat pivot)     Locomotion Ambulation   Ambulation assist   Ambulation activity did not occur: Safety/medical concerns  Assist level: Total Assistance - Patient < 25% Assistive device: Lite Gait Max distance: 164f   Walk 10 feet activity   Assist  Walk 10 feet activity did not occur: Safety/medical concerns  Walk 50 feet activity   Assist Walk 50 feet with 2 turns activity did not occur: Safety/medical concerns         Walk 150 feet activity   Assist Walk 150 feet activity did not occur: Safety/medical concerns         Walk 10 feet on uneven surface  activity   Assist Walk 10 feet on uneven surfaces activity did not occur: Safety/medical concerns         Wheelchair     Assist Is the patient using a wheelchair?: Yes Type of Wheelchair: Manual Wheelchair activity did not occur: Safety/medical concerns         Wheelchair 50 feet with 2 turns activity    Assist    Wheelchair 50 feet with 2 turns activity did not occur: Safety/medical concerns       Wheelchair 150 feet activity     Assist  Wheelchair 150 feet activity did not occur: Safety/medical concerns       Blood pressure 140/66, pulse 74, temperature 97.6 F (36.4  C), resp. rate 18, height '5\' 2"'$  (1.575 m), weight 53.5 kg, SpO2 98 %.      Medical Problem List and Plan: 1.   Left-sided hemiparesis with dysarthria/dysphagia secondary to right pontine infarction as well as multiple small remote bilateral cerebellar lacunar infarcts Continue CIR  Cont PRAFO, WHO nightly Continue CIR- PT, OT and SLP 2.  Impaired mobility: Continue Lovenox for DVT prophylaxis             -antiplatelet therapy: Aspirin 81 mg daily and Plavix 75 mg day x1 month then Plavix alone Flexor withdrawal spasms LLE-zanaflex at night but may be causing am fatigue will d/c 3. Pain Management/chronic back pain: Tylenol as needed  8/26- tylenol prn for tooth pain- has resolved- con't regimen  8/27- denies pain- con't tylenol prn 4. Depression: Continue Zoloft 100 mg daily, Aricept 5 mg nightly             -antipsychotic agents: N/A 5. Neuropsych: This patient is capable of making decisions on her own behalf. 6. Skin/Wound Care: Routine skin checks 7. Fluids/Electrolytes/Nutrition: Routine in and outs 8. Post stroke dysphagia.  Dysphagia #1 nectar thick liquids.  Follow-up speech therapy.  Monitor hydration  8/26- Cr 1.03- very slightly up, and BUN 33, however now on D2 thin diet- will recheck Monday   Advance diet as tolerated 9.  Hypertension.  Patient on verapamil 240 mg daily prior to admission as well as HCTZ 25 mg daily.     Vitals:   06/17/21 1940 06/18/21 0617  BP: 124/62 140/66  Pulse: 85 74  Resp: 17 18  Temp: 97.9 F (36.6 C) 97.6 F (36.4 C)  SpO2: 94% 98%   8/28- BP controlled- con't regimen 10.  Hyperlipidemia.  Lipitor 11.  Hypothyroidism.  TSH 2.093 .Continue Synthroid 12.  Constipation.  MiraLAX scheduled daily.  Senokot S2 tablets nightly  13. Hypokalemia: Supplementation started   8/25- will recheck labs- has been 10 days- in AM  8/26- K+ 4.2- con't to monitor 14. Crackles and coughing with food intake: placed nursing order for must be up with meals,  resolved   Chest x-ray unremarkable for acute process- per SLP fluids downgraded to Honey thick with repeat MBS this wk?  8/25- Pt now on D2 with thin liquids per orders- just upgraded today- so will recheck labs in AM  8/27- labs Monday- Bun was 33, but just upgraded diet to D2 thin.  15.  Hypoalbuminemia  Supplement initiated on 8/13 16.  Cognitive deficits post stroke - has evidence of small vessel disease on imaging as well as the new pontine infarct   8/25- con't memory aids on wall.  17. R upper tooth pain-  8/25- con't tylenol prn 18. Sedation with agitation  8/26- will stop melatonin- if this doesn't help, might benefit from amantadine.    8/27- Pt much more awake- will wait on Amantadine.   8/28- will add Seroquel 12.5 mg QHS prn for agitation at night- Melatonin made too sedated- still would think of amantadine for alertness during day?  LOS: 17 days A FACE TO FACE EVALUATION WAS PERFORMED  Vickie Mckee 06/18/2021, 2:05 PM

## 2021-06-18 NOTE — Progress Notes (Signed)
Speech Language Pathology Daily Session Note  Patient Details  Name: Vickie Mckee MRN: [id number] Date of Birth: 01-May-1931  Today's Date: 06/18/2021 SLP Individual Time: [id number] SLP Individual Time Calculation (min): 45 min  Short Term Goals: Week 3: SLP Short Term Goal 1 (Week 3): Patient will demonstrate orientation x4 with sup A verbal cues to refer to compensatory aids SLP Short Term Goal 2 (Week 3): Patient will demonstrate problem solving for basic tasks with sup verbal cues SLP Short Term Goal 3 (Week 3): Patient will tolerate current diet with minimal-to-no overt s/sx of aspiration with min A verbal cues for use of swallowing compensatory strategies SLP Short Term Goal 4 (Week 3): Patient will sustain attention for 8 minute intervals with min A verbal redirection SLP Short Term Goal 5 (Week 3): Patient will retain simple novel information following 3 minute delay with 75% accuracy and min A verbal cues  Skilled Therapeutic Interventions:  Pt was seen for skilled ST targeting goals for dysphagia and cognition.  Pt was sleeping upon arrival but awakened to voice and was agreeable to participating in treatment.  Pt requested to use the bathroom and was transferred to commode via Stedy lift with +2 assist from nurse tech.  Throughout transfers on and off commode pt needed max cues for safety due to difficulty maintaining midline orientation and strong left lean.  Once repositioned in bed, pt consumed presentations of her currently prescribed diet with min verbal cues for use of swallowing precautions to clear left buccal pocketing.  No overt s/s of aspiration were evident with solids or liquids.  Recommend that pt remain on her currently prescribed diet.  Pt was able to sustain her attention to her breakfast meal for ~5 minute intervals before requiring cues for redirection.  Pt remained fixated on the fact that she was not wearing a bra under her shirt despite therapist reassuring her that  she was not leaving the room for treatment at this time.  Pt was handed off to nursing to continue full supervision of meal as pt was still eating breakfast at the end of today's session.  Continue per current plan of care.    Pain Pain Assessment Pain Scale: 0-10 Pain Score: 0-No pain  Therapy/Group: Individual Therapy  Henning Ehle, Selinda Orion 06/18/2021, 12:10 PM

## 2021-06-18 NOTE — Progress Notes (Signed)
Physical Therapy Session Note  Patient Details  Name: Vickie Mckee MRN: [id number] Date of Birth: 1931/09/28  Today's Date: 06/18/2021 PT Individual Time: [id number] PT Individual Time Calculation (min): 70 min   Short Term Goals: Week 2:  PT Short Term Goal 1 (Week 2): Pt will perform supine<>sit with max assist of 1 consistently PT Short Term Goal 2 (Week 2): Pt will perform bed<>chair transfers with max assist of 1 consistently PT Short Term Goal 3 (Week 2): Pt will perform sit<>stands with max assist of 1 PT Short Term Goal 4 (Week 2): Pt will participate in body weight supported high intensity gait training reaching 84f  Skilled Therapeutic Interventions/Progress Updates:     Patient in bed with her 2 daughters and rehab tech assisting patient with donning top upon PT arrival. Patient alert and agreeable to PT session. Patient denied pain during session.  Focused session on gait training for improved trunk and R lower extremity control with functional mobility and motor control for standing balance and R upper extremity. Patient with significant STM deficits often repeating things several times during session, perseverative on not having a bra on throughout session, but very pleasant and in good spirits throughout session. Asked appropriate questions about recovery, PT provided general stroke education and patient appreciative.   Therapeutic Activity: Bed Mobility: Patient performed supine to/from sit with mod A with use of bed rail with the bed flat. Provided verbal cues for rolling through R side-lying, bringing knees to chest to bring legs off/on the bed, patient with mod A to bring R leg off the bed today, and setting R elbow to push up and lower her trunk. Transfers: Patient performed stand pivot bed<>w/c with mod A with facilitation and increased time for forward weight shift and boosting to standing with R knee blocked by therapist and tactile cues through patient's gluteals to  promote hip extension. She performed sit to/from stand x4 using a R rail and L arm over therapist's shoulder for support with min-mod A and R knee blocked, and x2 in // bars with mod-max A due to fatigue using R hand. Provided multimodal cues as above.  Gait Training:  Patient ambulated 15 feet x1 and 30 feet x2 using R rail with mod A and +2 for w/c follow. Ambulated with ~25% of steps with min a for R foot placement and some with CGA using L AFO with anterior strut to reduce knee buckling and improve DF/foot clearance in swing. Provided multimodal cues for sequencing, weight shifting, L knee/hip extension, reciprocal stepping, and leading with her L heel. Patient with intermittent crouched gait resulting in sitting on therapist's thigh x3 during gait trials. Limited support to reduce L knee flexion with AFO, but significant improvement with foot clearance.  Wheelchair Mobility:  Patient was transported in the w/c with total A throughout session for energy conservation and time management.  Neuromuscular Re-ed: Patient performed the following standing balance and L upper extremity motor control activities: -standing in // bars with B upper extremity support (hand over hand for L hand placement) focused on L trunk/hip/knee extension, L pelvic anterior rotation, and weight bearing through L hand; stood >2 min on first trial, <1 min on second trial due to fatigue -R grip with PROM for finger flexion/extension with motor imagery, elbow flexion extension with activation through ~50% of range, shoulder PROM for flexion and abduction to 90 degrees, scapular retraction, protraction, elevation, depression with AAROM on L, each activity performed x1-2 min  Patient in bed  at end of session with breaks locked, bed alarm set, and all needs within reach.   Therapy Documentation Precautions:  Precautions Precautions: Fall Precaution Comments: L hemiparesis Restrictions Weight Bearing Restrictions:  No    Therapy/Group: Individual Therapy  Bryn Saline L Ezelle Surprenant PT, DPT  06/18/2021, 4:39 PM

## 2021-06-18 NOTE — Plan of Care (Signed)
  Problem: RH BOWEL ELIMINATION Goal: RH STG MANAGE BOWEL WITH ASSISTANCE Description: STG Manage Bowel with min Assistance. Outcome: Not Progressing; incontinence   Problem: RH BLADDER ELIMINATION Goal: RH STG MANAGE BLADDER WITH ASSISTANCE Description: STG Manage Bladder With min Assistance Outcome: Not Progressing; incontinence   Problem: RH KNOWLEDGE DEFICIT Goal: RH STG INCREASE KNOWLEDGE OF STROKE PROPHYLAXIS Description: Patient will demonstrate knowledge of medication management of secondary stoke prevention medications with educational materials and handouts provided by staff independently at discharge. Outcome: Not Progressing; ; confusion

## 2021-06-18 NOTE — Progress Notes (Signed)
Pt. Was up most of the night with confusion and will not go to sleep. Emotional support given to pt. And reoriented her. Pt. Finally went to sleep at around 12 midnight.

## 2021-06-19 LAB — BASIC METABOLIC PANEL
Anion gap: 7 (ref 5–15)
BUN: 30 mg/dL — ABNORMAL HIGH (ref 8–23)
CO2: 27 mmol/L (ref 22–32)
Calcium: 9.2 mg/dL (ref 8.9–10.3)
Chloride: 104 mmol/L (ref 98–111)
Creatinine, Ser: 1.01 mg/dL — ABNORMAL HIGH (ref 0.44–1.00)
GFR, Estimated: 54 mL/min — ABNORMAL LOW (ref >60)
Glucose, Bld: 106 mg/dL — ABNORMAL HIGH (ref 70–99)
Potassium: 4 mmol/L (ref 3.5–5.1)
Sodium: 138 mmol/L (ref 135–145)

## 2021-06-19 LAB — CBC WITH DIFFERENTIAL/PLATELET
Abs Immature Granulocytes: 0.03 10*3/uL (ref 0.00–0.07)
Basophils Absolute: 0.1 10*3/uL (ref 0.0–0.1)
Basophils Relative: 1 %
Eosinophils Absolute: 0.5 10*3/uL (ref 0.0–0.5)
Eosinophils Relative: 6 %
HCT: 39.3 % (ref 36.0–46.0)
Hemoglobin: 13.4 g/dL (ref 12.0–15.0)
Immature Granulocytes: 0 %
Lymphocytes Relative: 28 %
Lymphs Abs: 2.3 10*3/uL (ref 0.7–4.0)
MCH: 30.8 pg (ref 26.0–34.0)
MCHC: 34.1 g/dL (ref 30.0–36.0)
MCV: 90.3 fL (ref 80.0–100.0)
Monocytes Absolute: 0.7 10*3/uL (ref 0.1–1.0)
Monocytes Relative: 9 %
Neutro Abs: 4.7 10*3/uL (ref 1.7–7.7)
Neutrophils Relative %: 56 %
Platelets: 249 10*3/uL (ref 150–400)
RBC: 4.35 MIL/uL (ref 3.87–5.11)
RDW: 14.6 % (ref 11.5–15.5)
WBC: 8.3 10*3/uL (ref 4.0–10.5)
nRBC: 0 % (ref 0.0–0.2)

## 2021-06-19 MED ORDER — QUETIAPINE FUMARATE 25 MG PO TABS
12.5000 mg | ORAL_TABLET | Freq: Every day | ORAL | Status: DC
  Administered 2021-06-19 – 2021-06-29 (×11): 12.5 mg via ORAL
  Filled 2021-06-19 (×11): qty 1

## 2021-06-19 MED ORDER — AMANTADINE HCL 100 MG PO CAPS
100.0000 mg | ORAL_CAPSULE | Freq: Every day | ORAL | Status: DC
  Administered 2021-06-19 – 2021-06-20 (×2): 100 mg via ORAL
  Filled 2021-06-19 (×2): qty 1

## 2021-06-19 MED ORDER — QUETIAPINE FUMARATE 25 MG PO TABS: 12.5000 mg | ORAL_TABLET | Freq: Every day | ORAL | Status: DC

## 2021-06-19 NOTE — Progress Notes (Signed)
PROGRESS NOTE   Subjective/Complaints:  Vickie Mckee feels sleepy this morning. Nurse didn't report any problems last night. Vickie Mckee received seroquel at 2100  ROS: Limited due to cognitive/behavioral   Objective:   No results found. Recent Labs    06/19/21 0601  WBC 8.3  HGB 13.4  HCT 39.3  PLT 249    Recent Labs    06/19/21 0601  NA 138  K 4.0  CL 104  CO2 27  GLUCOSE 106*  BUN 30*  CREATININE 1.01*  CALCIUM 9.2    Intake/Output Summary (Last 24 hours) at 06/19/2021 [id number] Last data filed at 06/19/2021 0700 Gross per 24 hour  Intake 564 ml  Output --  Net 564 ml        Physical Exam: Vital Signs Blood pressure 123/65, pulse 80, temperature 97.6 F (36.4 C), temperature source Oral, resp. rate 16, height '5\' 2"'$  (1.575 m), weight 53.5 kg, SpO2 96 %.       Constitutional: No distress . Vital signs reviewed. HEENT: NCAT, EOMI, oral membranes moist Neck: supple Cardiovascular: RRR without murmur. No JVD    Respiratory/Chest: CTA Bilaterally without wheezes or rales. Normal effort    GI/Abdomen: BS +, non-tender, non-distended Ext: no clubbing, cyanosis, or edema Psych: sedated, cooperative Neuro: arouses, follows commands, normal language, slow to process Motor: LUE: 0/5 proximal distal, except trace Left triceps perhaps LLE: Hip flexion, knee extension 2-/5, ankle dorsiflexion 0/5 Gross touch intact all 4  Dysarthric  Assessment/Plan: 1. Functional deficits which require 3+ hours per day of interdisciplinary therapy in a comprehensive inpatient rehab setting. Physiatrist is providing close team supervision and 24 hour management of active medical problems listed below. Physiatrist and rehab team continue to assess barriers to discharge/monitor patient progress toward functional and medical goals  Care Tool:  Bathing    Body parts bathed by patient: Left arm, Right upper leg, Left upper leg, Abdomen, Chest,  Face, Front perineal area   Body parts bathed by helper: Buttocks, Right lower leg, Left lower leg, Right arm     Bathing assist Assist Level: Maximal Assistance - Patient 24 - 49%     Upper Body Dressing/Undressing Upper body dressing   What is the patient wearing?: Pull over shirt    Upper body assist Assist Level: Moderate Assistance - Patient 50 - 74%    Lower Body Dressing/Undressing Lower body dressing      What is the patient wearing?: Incontinence brief, Pants     Lower body assist Assist for lower body dressing: 2 Helpers     Toileting Toileting    Toileting assist Assist for toileting: 2 Helpers     Transfers Chair/bed transfer  Transfers assist     Chair/bed transfer assist level: Maximal Assistance - Patient 25 - 49% (squat pivot)     Locomotion Ambulation   Ambulation assist   Ambulation activity did not occur: Safety/medical concerns  Assist level: Total Assistance - Patient < 25% Assistive device: Lite Gait Max distance: 155f   Walk 10 feet activity   Assist  Walk 10 feet activity did not occur: Safety/medical concerns        Walk 50 feet activity   Assist  Walk 50 feet with 2 turns activity did not occur: Safety/medical concerns         Walk 150 feet activity   Assist Walk 150 feet activity did not occur: Safety/medical concerns         Walk 10 feet on uneven surface  activity   Assist Walk 10 feet on uneven surfaces activity did not occur: Safety/medical concerns         Wheelchair     Assist Is the patient using a wheelchair?: Yes Type of Wheelchair: Manual Wheelchair activity did not occur: Safety/medical concerns         Wheelchair 50 feet with 2 turns activity    Assist    Wheelchair 50 feet with 2 turns activity did not occur: Safety/medical concerns       Wheelchair 150 feet activity     Assist  Wheelchair 150 feet activity did not occur: Safety/medical concerns        Blood pressure 123/65, pulse 80, temperature 97.6 F (36.4 C), temperature source Oral, resp. rate 16, height '5\' 2"'$  (1.575 m), weight 53.5 kg, SpO2 96 %.      Medical Problem List and Plan: 1.   Left-sided hemiparesis with dysarthria/dysphagia secondary to right pontine infarction as well as multiple small remote bilateral cerebellar lacunar infarcts Continue CIR  Cont PRAFO, WHO nightly Continue CIR- Vickie Mckee, OT and SLP 2.  Impaired mobility: Continue Lovenox for DVT prophylaxis             -antiplatelet therapy: Aspirin 81 mg daily and Plavix 75 mg day x1 month then Plavix alone Flexor withdrawal spasms LLE-zanaflex at night but may be causing am fatigue will d/c 3. Pain Management/chronic back pain: Tylenol as needed  8/26- tylenol prn for tooth pain- has resolved- con't regimen  8/29- denies pain- con't tylenol prn 4. Depression: Continue Zoloft 100 mg daily, Aricept 5 mg nightly             -antipsychotic agents: N/A 5. Neuropsych: This patient is capable of making decisions on her own behalf. 6. Skin/Wound Care: Routine skin checks 7. Fluids/Electrolytes/Nutrition: Routine in and outs 8. Post stroke dysphagia.  Dysphagia #1 nectar thick liquids.  Follow-up speech therapy.  Monitor hydration  8/29 BUN/Cr still elevated but stable to improved--continue to push fluids   -f/u labs Wednesday    9.  Hypertension.  Patient on verapamil 240 mg daily prior to admission as well as HCTZ 25 mg daily.     Vitals:   06/18/21 1930 06/19/21 0319  BP: 126/60 123/65  Pulse: 83 80  Resp: 16   Temp: 98.4 F (36.9 C) 97.6 F (36.4 C)  SpO2: 94% 96%   8/29- BP controlled- con't regimen 10.  Hyperlipidemia.  Lipitor 11.  Hypothyroidism.  TSH 2.093 .Continue Synthroid 12.  Constipation.  MiraLAX scheduled daily.  Senokot S2 tablets nightly  13. Hypokalemia: Supplementation started   8/29 K+ 4.0---continue supp 14. Crackles and coughing with food intake: placed nursing order for must be up with  meals, resolved     15.  Hypoalbuminemia  Supplement initiated on 8/13 16.  Cognitive deficits post stroke - has evidence of small vessel disease on imaging as well as the new pontine infarct   8/29- con't memory aids on wall.  17. R upper tooth pain-  8 /25- con't tylenol prn 18. Sedation with agitation  8/26- will stop melatonin- if this doesn't help, might benefit from amantadine.    8/27- Vickie Mckee much more awake- will  wait on Amantadine.   8/29 had a better night but sleepy this morning   -move up seroquel to 2000   -trial of amantadine '100mg'$  daily for arousal   -sleep chart  LOS: 18 days A FACE TO [place] 06/19/2021, 9:37 AM

## 2021-06-19 NOTE — Progress Notes (Signed)
Physical Therapy Session Note  Patient Details  Name: Vickie Mckee MRN: [id number] Date of Birth: [birth year]  Today's Date: 06/19/2021 PT Individual Time: 1300-1356 PT Individual Time Calculation (min): 56 min   Short Term Goals: Week 2:  PT Short Term Goal 1 (Week 2): Pt will perform supine<>sit with max assist of 1 consistently PT Short Term Goal 2 (Week 2): Pt will perform bed<>chair transfers with max assist of 1 consistently PT Short Term Goal 3 (Week 2): Pt will perform sit<>stands with max assist of 1 PT Short Term Goal 4 (Week 2): Pt will participate in body weight supported high intensity gait training reaching 27f   Skilled Therapeutic Interventions/Progress Updates:     Pt in w/c at start of session with daughter at bedside who reports that pt needs to use bathroom. Due to urgency, used Stedy to dependently transfer pt to toilet - requires modA for powering to rise in stedy and able to sit in perched position with CGA. Requires dependant assist for lower body clothes and pt was continent of bladder. Required sitting in perched position to perform frontal pericare.  Pt [place] and then transported to main rehab gym. Focused remainder of session on blocked practice squat<>pivot transfers from edge of mat to arm chair. She completed >10 reps with L knee block and was able to progress from modA to [employer]with mod cues for sequencing and setup. Next, worked on blocked practice sit<>stand transfers from mat table with L knee block and using back of arm chair for RUE support - completed>10 reps in total with modA and struggled mostly with achieving full upright due to lack of thoracic and hip extension. Pt assisted back to her w/c with minA squat<>pivot transfer and returned to her room. Assisted to bed in similar manner and required maxA for bed mobility. Pt boosted up in bed for repositioning with +2 assist and pt remained supine with all needs in reach,  bed alarm on, LUE supported with pillow, daughter at bedside at conclusion of session.   Therapy Documentation Precautions:  Precautions Precautions: Fall Precaution Comments: L hemiparesis Restrictions Weight Bearing Restrictions: No General:    Therapy/Group: Individual Therapy  CAlger Simons8/29/2022, 7:52 AM

## 2021-06-19 NOTE — Progress Notes (Signed)
Speech Language Pathology Daily Session Note  Patient Details  Name: Vickie Mckee MRN: [id number] Date of Birth: Apr 14, 1931  Today's Date: 06/19/2021 SLP Individual Time: [id number] SLP Individual Time Calculation (min): 45 min and Today's Date: 06/19/2021 SLP Missed Time: 15 Minutes Missed Time Reason: Patient fatigue  Short Term Goals: Week 3: SLP Short Term Goal 1 (Week 3): Patient will demonstrate orientation x4 with sup A verbal cues to refer to compensatory aids SLP Short Term Goal 2 (Week 3): Patient will demonstrate problem solving for basic tasks with sup verbal cues SLP Short Term Goal 3 (Week 3): Patient will tolerate current diet with minimal-to-no overt s/sx of aspiration with min A verbal cues for use of swallowing compensatory strategies SLP Short Term Goal 4 (Week 3): Patient will sustain attention for 8 minute intervals with min A verbal redirection SLP Short Term Goal 5 (Week 3): Patient will retain simple novel information following 3 minute delay with 75% accuracy and min A verbal cues  Skilled Therapeutic Interventions: Patient agreeable to skilled ST intervention with focus on swallowing and cognitive goals. Upon arrival, patient was consuming breakfast with NT at bedside. Patient exhibited no overt s/sx of aspiration with Dys 2 textures and thin liquid sips via cup. Patient appeared drowsy and displayed difficulty keeping her eyes open throughout session. She only consumed ~25% of breakfast which was discontinued by ST for safety secondary to lethargy.  She also exhibited improved awareness of anterior labial spillage which mainly occurred with liquids, and clearing left pocketing with lingual sweep with sup A verbal cues. Patient completed oral care with min A for thoroughness. Utilized oral sponge to clear mild residuals in left buccal cavity which were unable to be effectively cleared with lingual sweep. Patient required min A verbal cues to refer to orientation visuals to  identify month and year. Patient initially responded with the month as "January" and the year as "1956." Patient was oriented to place and situation without the need for verbal or visual reinforcement. Patient became increasingly drowsy toward end of session and attempts to facilitate additional cognitive-linguistic treatment was unsuccessful, thus session concluded 15 minutes early. Patient was left in bed with alarm activated and immediate needs within reach at end of session. Continue per current plan of care.      Pain Pain Assessment Pain Scale: 0-10 Pain Score: 0-No pain  Therapy/Group: Individual Therapy  Patty Sermons 06/19/2021, 8:55 AM

## 2021-06-19 NOTE — Progress Notes (Signed)
Patient ID: Vickie Mckee, female   DOB: July 19, 1931, 87 y.o.   MRN: [id number]  SW called pt daughter Butch Penny) to inform her of pt SNF approvals and denials. Pt denied by [facility]: no bed available currently, will continue to follow up. Dtr does not prefer [facility] Pt daughters will go look at [facility] as a potential secondary option.  [place], [place]

## 2021-06-19 NOTE — Progress Notes (Signed)
Occupational Therapy Session Note  Patient Details  Name: Vickie Mckee MRN: [id number] Date of Birth: 27-Sep-1931  Today's Date: 06/19/2021 OT Individual Time: [id number] OT Individual Time Calculation (min): 53 min    Short Term Goals: Week 3:  OT Short Term Goal 1 (Week 3): Pt will complete UB dressing with mod assist for donning a pullover shirt following hemi dressing techniques. OT Short Term Goal 2 (Week 3): Pt will complete LB bathing with max assist sit to stand. OT Short Term Goal 3 (Week 3): Pt will complete sit to stand for toileting tasks with mod assist.  Skilled Therapeutic Interventions/Progress Updates:    Pt completed bathing and dressing tasks EOB this session.  Max assist for supine to sit with min assist for sitting balance during completion of tasks.  Max demonstrational cueing to maintain upright head as she falls into flexion as well as for anterior pelvic tilt.  Min assist for UB bathing with total +2 (pt 25%) for LB bathing using the [place].  She was able to complete donning sports bra and pullover shirt with max assist as well as underpants and pants.  Total +2 (pt 25%) for pulling them over her hips in standing using the [place] again.  Max assist for squat pivot transfer to the wheelchair with therapist providing total assist for donning TEDs and shoes.  She was left with her daughter sitting up in the wheelchair with the call button and phone in reach.  Safety alarm belt in place as well.  Pt not oriented to place or time.    Therapy Documentation Precautions:  Precautions Precautions: Fall Precaution Comments: L hemiparesis Restrictions Weight Bearing Restrictions: No   Pain: Pain Assessment Pain Scale: Faces Pain Score: 0-No pain ADL: See Care Tool Section for some details of mobility and selfcare  Therapy/Group: Individual Therapy  Paytan Recine OTR/L 06/19/2021, 12:33 PM

## 2021-06-19 NOTE — Progress Notes (Signed)
Patient ID: [name], female   DOB: [birth year], 87 y.o.   MRN: [id number]  SW spoke to AD at [facility]. Pt newly renovated room with available on 9/9 for d/c to [facility]   [place], [place]

## 2021-06-20 MED ORDER — AMANTADINE HCL 50 MG/5ML PO SOLN
100.0000 mg | Freq: Every day | ORAL | Status: DC
  Administered 2021-06-21: 100 mg via ORAL
  Filled 2021-06-20: qty 10

## 2021-06-20 NOTE — Progress Notes (Signed)
Speech Language Pathology Daily Session Note  Patient Details  Name: Vickie Mckee MRN: [id number] Date of Birth: 16-Jun-1931  Today's Date: 06/20/2021 SLP Individual Time: 0800-0900 SLP Individual Time Calculation (min): 60 min  Short Term Goals: Week 3: SLP Short Term Goal 1 (Week 3): Patient will demonstrate orientation x4 with sup A verbal cues to refer to compensatory aids SLP Short Term Goal 2 (Week 3): Patient will demonstrate problem solving for basic tasks with sup verbal cues SLP Short Term Goal 3 (Week 3): Patient will tolerate current diet with minimal-to-no overt s/sx of aspiration with min A verbal cues for use of swallowing compensatory strategies SLP Short Term Goal 4 (Week 3): Patient will sustain attention for 8 minute intervals with min A verbal redirection SLP Short Term Goal 5 (Week 3): Patient will retain simple novel information following 3 minute delay with 75% accuracy and min A verbal cues  Skilled Therapeutic Interventions: Patient agreeable to skilled ST intervention with focus on cognitive goals. Patient was sleeping on arrival and easy to arouse after verbal greeting. However, she remained drowsy throughout session with intermittent cueing to arouse and encouragement to keep her eyes open. SLP sat patient upright, opened blinds, turned on lights, provided patient with warm wash cloth for her face, and played light music in the background for additional stimulation which increased alertness. Patient had already consumed her breakfast with supervision from NT prior to session, although patient did not recall she had already eaten. Facilitated set-up with oral care and sup A verbal cues for thoroughness. Facilitated verbal reasoning and sustained attention with novel "Scattergories" activity in which patient completed with min A semantic cues for generating words in categories or based on initial letter, and sustained attention for ~5 minute intervals with min A verbal  redirection. Continues to perseverate of "I'm so sleepy" frequently throughout session. Patient was oriented to person, place, and situation. Required sup A verbal cues to locate orientation visual stating "it is August, 2022". Patient was left in bed with alarm activated and immediate needs within reach at end of session. Continue per current plan of care.      Pain Pain Assessment Pain Scale: 0-10 Pain Score: 0-No pain  Therapy/Group: Individual Therapy  Jalonda Antigua T Davin Muramoto 06/20/2021, 9:01 AM

## 2021-06-20 NOTE — Progress Notes (Signed)
Occupational Therapy Session Note  Patient Details  Name: Vickie Mckee MRN: [id number] Date of Birth: 1931-09-01  Today's Date: 06/20/2021 OT Individual Time: [id number] OT Individual Time Calculation (min): 70 min    Short Term Goals: Week 3:  OT Short Term Goal 1 (Week 3): Pt will complete UB dressing with mod assist for donning a pullover shirt following hemi dressing techniques. OT Short Term Goal 2 (Week 3): Pt will complete LB bathing with max assist sit to stand. OT Short Term Goal 3 (Week 3): Pt will complete sit to stand for toileting tasks with mod assist.  Skilled Therapeutic Interventions/Progress Updates:    Pt transferring out of the toilet with NT present for session.  Assisted with transfer to the wheelchair.  Completed transfer to the therapy mat with mod assist squat pivot.  Worked on sitting balance with transitions from sit to stand while engaged in placing playing cards.  She needed max assist for sit to stand to place the cards with the RUE.  Increased trunk flexion noted with max facilitation needed to complete upright trunk, hip extension, and left knee extension.  Completed several episodes with increased fatigue noted.  Finished session with mod assist squat pivot transfer back to the wheelchair with return to the room.  She was left up in the wheelchair with family.    Therapy Documentation Precautions:  Precautions Precautions: Fall Precaution Comments: L hemiparesis Restrictions Weight Bearing Restrictions: No  Pain: Pain Assessment Pain Scale: Faces Pain Score: 0-No pain ADL: See Care Tool Section for some details of mobility and selfcare  Therapy/Group: Individual Therapy  Raivyn Kabler OTR/L 06/20/2021, 4:22 PM

## 2021-06-20 NOTE — Progress Notes (Signed)
Nutrition Follow-up  DOCUMENTATION CODES:   Not applicable  INTERVENTION:  Continue Ensure Enlive po BID, each supplement provides 350 kcal and 20 grams of protein   Continue 30 ml Prosource plus po BID, each supplement provides 100 kcal and 15 grams of protein.    Encourage adequate PO intake.   NUTRITION DIAGNOSIS:   Increased nutrient needs related to  (therapy) as evidenced by estimated needs; ongoing  GOAL:   Patient will meet greater than or equal to 90% of their needs; met  MONITOR:   PO intake, Supplement acceptance, Skin, Weight trends, Labs, I & O's, Diet advancement  REASON FOR ASSESSMENT:   Malnutrition Screening Tool    ASSESSMENT:   87 year old female with history of hypertension hypothyroidism GERD chronic low back pain, hypertrophic obstructive cardiomyopathy. Presents with acute onset of left-sided weakness and dysarthria as well as left lateral gaze. MRI showed acute right pontine infarction. Multiple small remote bilateral cerebellar lacunar infarcts. Therapy evaluations completed due to patient's left-sided weakness dysarthria/dysphagia was admitted for a comprehensive rehab program.  Meal completion has been varied from 15-95%. Pt is currently on a dysphagia 2 diet with thin liquids. Pt currently has Ensure and Prosource plus ordered and has been consuming them. RD to continue with current orders to aid in caloric and protein needs.   Labs and medications reviewed.  Diet Order:   Diet Order             DIET DYS 2 Room service appropriate? Yes; Fluid consistency: Thin  Diet effective now                   EDUCATION NEEDS:   Not appropriate for education at this time  Skin:  Skin Assessment: Reviewed RN Assessment  Last BM:  8/29  Height:   Ht Readings from Last 1 Encounters:  06/02/21 '5\' 2"'  (1.575 m)    Weight:   Wt Readings from Last 1 Encounters:  06/04/21 53.5 kg   BMI:  Body mass index is 21.57 kg/m.  Estimated  Nutritional Needs:   Kcal:  1550-1700  Protein:  75-85 grams  Fluid:  >/= 1.5 L/day   Corrin Parker, MS, RD, LDN RD pager number/after hours weekend pager number on Amion.

## 2021-06-20 NOTE — Progress Notes (Signed)
PROGRESS NOTE   Subjective/Complaints:  Pt c/o being sleepy- ate a little for breakfast- <50%. Amantadine needs to be changed to liquid per nursing.  Got seroquel last night.,  Screams per nurse within 5 minutes of putting on arm brace and PRAFO on L!   ROS: limited due to cognition  Objective:   No results found. Recent Labs    06/19/21 0601  WBC 8.3  HGB 13.4  HCT 39.3  PLT 249    Recent Labs    06/19/21 0601  NA 138  K 4.0  CL 104  CO2 27  GLUCOSE 106*  BUN 30*  CREATININE 1.01*  CALCIUM 9.2    Intake/Output Summary (Last 24 hours) at 06/20/2021 [id number] Last data filed at 06/20/2021 0700 Gross per 24 hour  Intake 230 ml  Output --  Net 230 ml        Physical Exam: Vital Signs Blood pressure (!) 133/59, pulse 68, temperature 98.3 F (36.8 C), temperature source Axillary, resp. rate 17, height '5\' 2"'$  (1.575 m), weight 53.5 kg, SpO2 95 %.        General: awake, sleepy; NT feeding pt and nurse in room;  NAD HENT: conjugate gaze; oropharynx moist CV: regular rate; no JVD Pulmonary: CTA B/L; no W/R/R- good air movement GI: soft, NT, ND, (+)BS Psychiatric: sleepy, but confused Neurological: Ox1 Has L foot 0/5 DF at rest in severe PF-  Ext: no clubbing, cyanosis, or edema Neuro: arouses, follows commands, normal language, slow to process Motor: LUE: 0/5 proximal distal, except trace Left triceps perhaps LLE: Hip flexion, knee extension 2-/5, ankle dorsiflexion 0/5 Gross touch intact all 4  Dysarthric  Assessment/Plan: 1. Functional deficits which require 3+ hours per day of interdisciplinary therapy in a comprehensive inpatient rehab setting. Physiatrist is providing close team supervision and 24 hour management of active medical problems listed below. Physiatrist and rehab team continue to assess barriers to discharge/monitor patient progress toward functional and medical goals  Care  Tool:  Bathing    Body parts bathed by patient: Left arm, Right upper leg, Left upper leg, Abdomen, Chest, Face, Front perineal area   Body parts bathed by helper: Buttocks, Right lower leg, Left lower leg, Right arm     Bathing assist Assist Level: Maximal Assistance - Patient 24 - 49%     Upper Body Dressing/Undressing Upper body dressing   What is the patient wearing?: Pull over shirt    Upper body assist Assist Level: Moderate Assistance - Patient 50 - 74%    Lower Body Dressing/Undressing Lower body dressing      What is the patient wearing?: Incontinence brief, Pants     Lower body assist Assist for lower body dressing: 2 Helpers     Toileting Toileting    Toileting assist Assist for toileting: 2 Helpers     Transfers Chair/bed transfer  Transfers assist     Chair/bed transfer assist level: Minimal Assistance - Patient > 75%     Locomotion Ambulation   Ambulation assist   Ambulation activity did not occur: Safety/medical concerns  Assist level: Total Assistance - Patient < 25% Assistive device: Lite Gait Max distance: 132f   Walk  10 feet activity   Assist  Walk 10 feet activity did not occur: Safety/medical concerns        Walk 50 feet activity   Assist Walk 50 feet with 2 turns activity did not occur: Safety/medical concerns         Walk 150 feet activity   Assist Walk 150 feet activity did not occur: Safety/medical concerns         Walk 10 feet on uneven surface  activity   Assist Walk 10 feet on uneven surfaces activity did not occur: Safety/medical concerns         Wheelchair     Assist Is the patient using a wheelchair?: Yes Type of Wheelchair: Manual Wheelchair activity did not occur: Safety/medical concerns         Wheelchair 50 feet with 2 turns activity    Assist    Wheelchair 50 feet with 2 turns activity did not occur: Safety/medical concerns       Wheelchair 150 feet activity      Assist  Wheelchair 150 feet activity did not occur: Safety/medical concerns       Blood pressure (!) 133/59, pulse 68, temperature 98.3 F (36.8 C), temperature source Axillary, resp. rate 17, height '5\' 2"'$  (1.575 m), weight 53.5 kg, SpO2 95 %.      Medical Problem List and Plan: 1.   Left-sided hemiparesis with dysarthria/dysphagia secondary to right pontine infarction as well as multiple small remote bilateral cerebellar lacunar infarcts Continue CIR  Cont PRAFO, WHO nightly Continue CIR- PT, OT and SLP- pt refusing PRAFO and cock up wrist splint 2.  Impaired mobility: Continue Lovenox for DVT prophylaxis             -antiplatelet therapy: Aspirin 81 mg daily and Plavix 75 mg day x1 month then Plavix alone Flexor withdrawal spasms LLE-zanaflex at night but may be causing am fatigue will d/c 3. Pain Management/chronic back pain: Tylenol as needed  8/30- denies pain, but might be worth trying tylenol before putting on braces at night.  4. Depression: Continue Zoloft 100 mg daily, Aricept 5 mg nightly             -antipsychotic agents: N/A 5. Neuropsych: This patient is NOT capable of making decisions on her own behalf. 6. Skin/Wound Care: Routine skin checks 7. Fluids/Electrolytes/Nutrition: Routine in and outs 8. Post stroke dysphagia.  Dysphagia #1 nectar thick liquids.  Follow-up speech therapy.  Monitor hydration  8/29 BUN/Cr still elevated but stable to improved--continue to push fluids   -f/u labs Wednesday    9.  Hypertension.  Patient on verapamil 240 mg daily prior to admission as well as HCTZ 25 mg daily.     Vitals:   06/19/21 2041 06/20/21 0410  BP: (!) 126/55 (!) 133/59  Pulse: 77 68  Resp: 17   Temp: (!) 97.5 F (36.4 C) 98.3 F (36.8 C)  SpO2: 96% 95%   8/29- BP controlled- con't regimen 10.  Hyperlipidemia.  Lipitor 11.  Hypothyroidism.  TSH 2.093 .Continue Synthroid 12.  Constipation.  MiraLAX scheduled daily.  Senokot S2 tablets nightly  13.  Hypokalemia: Supplementation started   8/29 K+ 4.0---continue supp 14. Crackles and coughing with food intake: placed nursing order for must be up with meals, resolved     15.  Hypoalbuminemia  Supplement initiated on 8/13 16.  Cognitive deficits post stroke - has evidence of small vessel disease on imaging as well as the new pontine infarct   8/29- con't  memory aids on wall.  17. R upper tooth pain-  8 /25- con't tylenol prn 18. Sedation with agitation  8/26- will stop melatonin- if this doesn't help, might benefit from amantadine.    8/27- Pt much more awake- will wait on Amantadine.   8/29 had a better night but sleepy this morning   -move up seroquel to 2000   -trial of amantadine '100mg'$  daily for arousal   -sleep chart  8/30- changed to Amantadine liquid 100 mg daily- sleeping when gets seroquel at night.   LOS: 19 days A FACE TO FACE EVALUATION WAS PERFORMED  Talor Cheema 06/20/2021, 9:27 AM

## 2021-06-20 NOTE — Progress Notes (Signed)
Physical Therapy Weekly Progress Note  Patient Details  Name: Vickie Mckee MRN: [id number] Date of Birth: July 29, 1931  Beginning of progress report period: June 12, 2021 End of progress report period: June 20, 2021  Today's Date: 06/20/2021 PT Individual Time: 1005-1039 and 6283-1517 PT Individual Time Calculation (min): 34 min and 58 min   Patient has met 3 of 4 short term goals. Vickie Mckee is progressing well with therapy demonstrating increasing independence with functional mobility. She is having increased return in L LE compared to L UE. She is performing supine<>sit with max assist, sit<>stands with max assist (continuing to have most difficulty extending hips and bringing weight forward over her base of support), squat pivot transfers with mod assist (and intermittently as little as min assist),  and has continued participating in high intensity gait training. She is ambulating up to 168f in litegait with slight body weight support allowing increased focus on L LE gait mechanics vs up to 872foverground with mod assist and +2 for UE support - she is able to advance L LE up to ~40% of the way during swing and then requires max assist during stance to block L knee and promote increased hip extension and forward translation of pelvis over L stance limb. She will benefit from continued CIR level therapies to further advance her independence with functional mobility.  Patient continues to demonstrate the following deficits muscle weakness and muscle paralysis, decreased cardiorespiratoy endurance, impaired timing and sequencing, abnormal tone, unbalanced muscle activation, decreased coordination, and decreased motor planning, decreased attention to left, decreased initiation, decreased attention, decreased awareness, decreased problem solving, decreased safety awareness, decreased memory, and delayed processing, and decreased sitting balance, decreased standing balance, decreased postural  control, hemiplegia, and decreased balance strategies and therefore will continue to benefit from skilled PT intervention to increase functional independence with mobility.  Patient progressing toward long term goals..  Plan of care revisions: Patient's daughters are now considering D/C to SNF as opposed to D/C home with 24hr support from family.  PT Short Term Goals Week 2:  PT Short Term Goal 1 (Week 2): Pt will perform supine<>sit with max assist of 1 consistently PT Short Term Goal 1 - Progress (Week 2): Met PT Short Term Goal 2 (Week 2): Pt will perform bed<>chair transfers with max assist of 1 consistently PT Short Term Goal 2 - Progress (Week 2): Met PT Short Term Goal 3 (Week 2): Pt will perform sit<>stands with max assist of 1 PT Short Term Goal 3 - Progress (Week 2): Progressing toward goal PT Short Term Goal 4 (Week 2): Pt will participate in body weight supported high intensity gait training reaching 7577fT Short Term Goal 4 - Progress (Week 2): Met Week 3:  PT Short Term Goal 1 (Week 3): Pt will perform supine<>sit with mod assist PT Short Term Goal 2 (Week 3): Pt will perform sit<>stands with mod assist of 1 PT Short Term Goal 3 (Week 3): Pt will perform bed<>chair transfers with mod assist of 1 PT Short Term Goal 4 (Week 3): Pt will ambulate at least [id number] LRAD with max assist of 1 (+2 w/c follow for safety if needed)  Skilled Therapeutic Interventions/Progress Updates:  Ambulation/gait training;Balance/vestibular training;Cognitive remediation/compensation;Community reintegration;Discharge planning;DME/adaptive equipment instruction;Functional mobility training;Disease management/prevention;Neuromuscular re-education;Pain management;Patient/family education;Psychosocial support;Splinting/orthotics;Stair training;Therapeutic Activities;Therapeutic Exercise;UE/LE Strength taining/ROM;UE/LE Coordination activities;Visual/perceptual remediation/compensation;Wheelchair  propulsion/positioning;Skin care/wound management;Functional electrical stimulation   Session 1: Pt received supine in bed awake, but drowsy reporting "I'm so sleepy." Despite this, pt agreeable  to therapy session. Supine>sitting R EOB, HOB flat but using bedrail, with max assist for L hemibody management and trunk upright - continues to demo strong posterior trunk lean while coming to sit requiring max cuing/facilitation for forward weight shift. Sitting EOB, with intermittent min progressed to mod/max assist for trunk control due to R posterior LOB while therapist provided total assist to don B LE TED hose and LB clothing including shoes - would intermittently have pt flex trunk forward and reach down to assist with managing LB clothing to decreased posterior LOB. Sit>stand EOB> R UE support on back of chair with L knee block and total assist for lifting to stand - pt continues to have strong posterior lean while coming to stand with lack of hip extension and anterior weight shift - dependent assist to pull pants up over hips. Noted pt needed peri-care due to odor therefore retrieved stedy and +2 assist. Sit>stand EOB>stedy with max assist of 1 for lifting to stand and manually facilitating increased hip extension and anterior weight shift (supporting L UE during transfers) while +2 assisted with peri-care for cleanliness. Sit>supine with max assist for trunk descent and B LE management onto bed. Pt left R semi-sidelying in bed for pressure relief with needs in reach, bed alarm on, and L hemibody supported on pillows.  Session 2: Pt received supine in bed reporting she is tired but despite this is agreeable to therapy session. Supine>sitting R EOB, HOB partially elevated and using bedrail, with max assist for L hemibody management and trunk upright - continues to have R posterior lean upon coming to sit but able to achieve midline quicker than this AM. Reports need to use bathroom. Sit>stand EOB>stedy with mod  assist for lifting to stand and supporting L UE - has delayed and decreased L knee extension compared to R. Stedy transfer in/out of bathroom. Sit>stand from stedy seat with heavy min assist for lifting to stand - +2 dependent assist LB clothing management and peri-care (continent of bladder and bowels). Stedy transfer to w/c.  Transported to/from gym in w/c for time management and energy conservation.  Donned L LE ankle DF assist ACE wrap. Sit>stand w/c>R UE support on hallway rail with mod assist of 1 for lifting to stand (slow to rise up) - primarily facilitating increased trunk/hip extension and anterior weight shift to prevent posterior lean (did better by having pt place R hand forward really far on handrail to promote anterior trunk lean).   Gait training at R hallway rail 39f (using R HHA from +2 assist during gaps in handrails) with mod assist of 1 for balance and L LE gait mechanics - able to advance L LE ~25% of the time (slow to initiation) and requires heavier assist to block L knee buckle during stance while heavily facilitating increased hip extension and forward movement of pelvis over L stance limb - requires repeated cuing throughout for improved trunk/hip extension to maintain upright posture otherwise flexes forward at the hips - +2 for w/c follow.   Gait training 841fwith +2 assist providing BUE support in the front of patient while therapist facilitated mod assist for balance and L LE gait mechanics as just described (+3 for w/c follow) - pt demos increased sustained trunk/hip extension throughout gait compared to when using R handrail. Performed a 2nd bout of this for 3012fs just described but pt with increasing fatigue requiring max assist for balance and L LE gait mechanics. Transported back to room and left sitting in  w/c with NT present to assist with meal.   Therapy Documentation Precautions:  Precautions Precautions: Fall Precaution Comments: L  hemiparesis Restrictions Weight Bearing Restrictions: No   Pain:  Session 1: No reports of pain throughout session.  Session 2: Reports some discomfort in L shoulder depending on positioning therefore ensured supported throughout session.  Therapy/Group: Individual Therapy  Tawana Scale , PT, DPT, NCS, CSRS 06/20/2021, 7:56 AM

## 2021-06-21 LAB — BASIC METABOLIC PANEL
Anion gap: 6 (ref 5–15)
BUN: 31 mg/dL — ABNORMAL HIGH (ref 8–23)
CO2: 27 mmol/L (ref 22–32)
Calcium: 9.1 mg/dL (ref 8.9–10.3)
Chloride: 104 mmol/L (ref 98–111)
Creatinine, Ser: 1.08 mg/dL — ABNORMAL HIGH (ref 0.44–1.00)
GFR, Estimated: 50 mL/min — ABNORMAL LOW (ref >60)
Glucose, Bld: 111 mg/dL — ABNORMAL HIGH (ref 70–99)
Potassium: 3.7 mmol/L (ref 3.5–5.1)
Sodium: 137 mmol/L (ref 135–145)

## 2021-06-21 MED ORDER — AMANTADINE HCL 50 MG/5ML PO SOLN
100.0000 mg | Freq: Two times a day (BID) | ORAL | Status: DC
  Administered 2021-06-21 – 2021-06-28 (×13): 100 mg via ORAL
  Filled 2021-06-21 (×17): qty 10

## 2021-06-21 NOTE — Progress Notes (Signed)
PROGRESS NOTE   Subjective/Complaints: Patient seen laying in bed this morning.  He indicates she slept well overnight, slept well/fair per sleep chart.  ROS: limited due to cognition  Objective:   No results found. Recent Labs    06/19/21 0601  WBC 8.3  HGB 13.4  HCT 39.3  PLT 249     Recent Labs    06/19/21 0601 06/21/21 0510  NA 138 137  K 4.0 3.7  CL 104 104  CO2 27 27  GLUCOSE 106* 111*  BUN 30* 31*  CREATININE 1.01* 1.08*  CALCIUM 9.2 9.1     Intake/Output Summary (Last 24 hours) at 06/21/2021 1119 Last data filed at 06/20/2021 2030 Gross per 24 hour  Intake 270 ml  Output --  Net 270 ml         Physical Exam: Vital Signs Blood pressure 126/62, pulse 70, temperature (!) 97.4 F (36.3 C), resp. rate 16, height '5\' 2"'$  (1.575 m), weight 53.5 kg, SpO2 95 %. Constitutional: No distress . Vital signs reviewed. HENT: Normocephalic.  Atraumatic. Eyes: EOMI. No discharge. Cardiovascular: No JVD.  RRR. Respiratory: Normal effort.  No stridor.  Bilateral clear to auscultation. GI: Non-distended.  BS +. Skin: Warm and dry.  Intact. Psych: Somnolent. Slowed Musc: No edema in extremities.  No tenderness in extremities. Neuro: Somnolent Motor: No movement noted in left upper extremity  Assessment/Plan: 1. Functional deficits which require 3+ hours per day of interdisciplinary therapy in a comprehensive inpatient rehab setting. Physiatrist is providing close team supervision and 24 hour management of active medical problems listed below. Physiatrist and rehab team continue to assess barriers to discharge/monitor patient progress toward functional and medical goals  Care Tool:  Bathing    Body parts bathed by patient: Left arm, Right upper leg, Left upper leg, Abdomen, Chest, Face, Front perineal area   Body parts bathed by helper: Buttocks, Right lower leg, Left lower leg, Right arm     Bathing  assist Assist Level: Maximal Assistance - Patient 24 - 49%     Upper Body Dressing/Undressing Upper body dressing   What is the patient wearing?: Pull over shirt    Upper body assist Assist Level: Moderate Assistance - Patient 50 - 74%    Lower Body Dressing/Undressing Lower body dressing      What is the patient wearing?: Incontinence brief, Pants     Lower body assist Assist for lower body dressing: 2 Helpers     Toileting Toileting    Toileting assist Assist for toileting: 2 Helpers     Transfers Chair/bed transfer  Transfers assist     Chair/bed transfer assist level: Minimal Assistance - Patient > 75%     Locomotion Ambulation   Ambulation assist   Ambulation activity did not occur: Safety/medical concerns  Assist level: 2 helpers (mod A of 1 and +2 UE support) Assistive device: Hand held assist Max distance: 63f   Walk 10 feet activity   Assist  Walk 10 feet activity did not occur: Safety/medical concerns  Assist level: 2 helpers (mod A of 1 and +2 UE support) Assistive device: Hand held assist   Walk 50 feet activity   Assist  Walk 50 feet with 2 turns activity did not occur: Safety/medical concerns         Walk 150 feet activity   Assist Walk 150 feet activity did not occur: Safety/medical concerns         Walk 10 feet on uneven surface  activity   Assist Walk 10 feet on uneven surfaces activity did not occur: Safety/medical concerns         Wheelchair     Assist Is the patient using a wheelchair?: Yes Type of Wheelchair: Manual Wheelchair activity did not occur: Safety/medical concerns         Wheelchair 50 feet with 2 turns activity    Assist    Wheelchair 50 feet with 2 turns activity did not occur: Safety/medical concerns       Wheelchair 150 feet activity     Assist  Wheelchair 150 feet activity did not occur: Safety/medical concerns       Blood pressure 126/62, pulse 70, temperature  (!) 97.4 F (36.3 C), resp. rate 16, height '5\' 2"'$  (1.575 m), weight 53.5 kg, SpO2 95 %.      Medical Problem List and Plan: 1.   Left-sided hemiparesis with dysarthria/dysphagia secondary to right pontine infarction as well as multiple small remote bilateral cerebellar lacunar infarcts Continue CIR  Cont PRAFO, WHO nightly 2.  Impaired mobility: Continue Lovenox for DVT prophylaxis             -antiplatelet therapy: Aspirin 81 mg daily and Plavix 75 mg day x1 month then Plavix alone Flexor withdrawal spasms LLE-zanaflex at night but may be causing am fatigue will d/c 3. Pain Management/chronic back pain: Tylenol as needed 4. Depression: Continue Zoloft 100 mg daily, Aricept 5 mg nightly             -antipsychotic agents: N/A 5. Neuropsych: This patient is NOT capable of making decisions on her own behalf. 6. Skin/Wound Care: Routine skin checks 7. Fluids/Electrolytes/Nutrition: Routine in and outs 8. Post stroke dysphagia.   D2 thins  Follow-up speech therapy.  Monitor hydration 9.  Hypertension.  Patient on verapamil 240 mg daily prior to admission as well as HCTZ 25 mg daily.     Vitals:   06/20/21 2013 06/21/21 0529  BP: (!) 111/55 126/62  Pulse: 86 70  Resp: 18 16  Temp: 97.6 F (36.4 C) (!) 97.4 F (36.3 C)  SpO2: 97% 95%   Relatively controlled on 8/31 10.  Hyperlipidemia.  Lipitor 11.  Hypothyroidism.  TSH 2.093 .Continue Synthroid 12.  Constipation.  MiraLAX scheduled daily.  Senokot S2 tablets nightly  13. Hypokalemia: Supplementation started   Potassium 3.7 on 8/31 14. Crackles and coughing with food intake: placed nursing order for must be up with meals, resolved  15.  Hypoalbuminemia  Supplement initiated on 8/13 16.  Cognitive deficits post stroke - has evidence of small vessel disease on imaging as well as the new pontine infarct   8/29- con't memory aids on wall.  17. R upper tooth pain-  ?  Resolved 18. Sedation with agitation  8/26- will stop melatonin-  if this doesn't help, might benefit from amantadine.    Amantadine 100 daily increase to twice daily/31.   Moved up seroquel to 2000  LOS: 20 days A FACE TO FACE EVALUATION WAS PERFORMED  Farhan Jean [name] 06/21/2021, 11:19 AM

## 2021-06-21 NOTE — Progress Notes (Signed)
Patient ID: Vickie Mckee, female   DOB: Dec 01, 1930, 87 y.o.   MRN: [id number] Team Conference Report to Patient/Family  Team Conference discussion was reviewed with the patient and caregiver, including goals, any changes in plan of care and target discharge date.  Patient and caregiver express understanding and are in agreement.  The patient has a target discharge date of 06/30/21.  Sw met with pt and daughter in pt's room. Provided updates to pt daughter Vickie Mckee). Pt not sleeping well at night, staff report 3 hours of sleep. Therapy potentially will downgrade pt goals. Pt d/c to Snf on 9/9.     [name] 06/21/2021, 1:14 PM

## 2021-06-21 NOTE — Progress Notes (Signed)
Occupational Therapy Session Note  Patient Details  Name: Vickie Mckee MRN: [id number] Date of Birth: 05/01/1931  Today's Date: 06/21/2021 OT Individual Time: [id number] OT Individual Time Calculation (min): 25 min    Short Term Goals: Week 3:  OT Short Term Goal 1 (Week 3): Pt will complete UB dressing with mod assist for donning a pullover shirt following hemi dressing techniques. OT Short Term Goal 2 (Week 3): Pt will complete LB bathing with max assist sit to stand. OT Short Term Goal 3 (Week 3): Pt will complete sit to stand for toileting tasks with mod assist.  Skilled Therapeutic Interventions/Progress Updates:    Pt in bed to start session, agreeable to participate but reports increased fatigue and sleepiness.  She needed max assist for supine to sit on the right side of the bed.  She was then able to maintain sitting balance with min guard assist statically with posterior pelvic tilt and cervical flexion/protraction.  She was able to work on donning pants, TEDs, and shoes EOB.  Encouraged hemi techniques for donning pants with mod assist to thread them and mod assist to cross the LLE over the right knee and maintain while threading them.  Total +2 (pt 30%) for standing to pull them up over her hips.  Total assist for donning TEDs with mod assist for donning shoes and total assist for tying them.  Mod assist for squat pivot transfer to the wheelchair with completion of oral hygiene and combing her hair with setup at the sink.  Finished session with pt remaining in the wheelchair with the safety belt and call button in reach.  Nursing present at end of session as well.    Therapy Documentation Precautions:  Precautions Precautions: Fall Precaution Comments: L hemiparesis Restrictions Weight Bearing Restrictions: No  Pain: Pain Assessment Pain Scale: Faces Pain Score: 0-No pain ADL: See Care Tool Section for some details of mobility and selfcare   Therapy/Group: Individual  Therapy  Sanita Estrada OTR/L 06/21/2021, 11:52 AM

## 2021-06-21 NOTE — Progress Notes (Signed)
Physical Therapy Session Note  Patient Details  Name: Vickie Mckee MRN: [id number] Date of Birth: Feb 27, 1931  Today's Date: 06/21/2021 PT Individual Time: [id number] PT Individual Time Calculation (min): 30 min   and  Today's Date: 06/21/2021 PT Missed Time: 30 Minutes Missed Time Reason: Patient fatigue  Short Term Goals: Week 3:  PT Short Term Goal 1 (Week 3): Pt will perform supine<>sit with mod assist PT Short Term Goal 2 (Week 3): Pt will perform sit<>stands with mod assist of 1 PT Short Term Goal 3 (Week 3): Pt will perform bed<>chair transfers with mod assist of 1 PT Short Term Goal 4 (Week 3): Pt will ambulate at least 3f using LRAD with max assist of 1 (+2 w/c follow for safety if needed)  Skilled Therapeutic Interventions/Progress Updates:    Pt received supine in bed with lights off. Per therapy tech report prior to this therapist arrival pt was crying out yelling "help me." Pt appears with increased distress this afternoon stating "my daughters left me here." Therapist reoriented patient to location and situation - pt typically utilizes signs in room to reorient herself but not doing that this afternoon. Pt agreeable to assistance to wheelchair but 3x declines participating in gait training this afternoon stating she is too tired. Supine>sitting R EOB, HOB partially elevated and using bedrail, with max assist of 1 for L hemibody management and trunk upright - pt demos increased initiation and activation of L LE movement towards EOB - continues to have R posterior trunk lean while coming to sit requiring mod assist to scoot his towards EOB for improved trunk control. Sitting EOB, with intermittent min/mod assist for trunk balance recovery due to posterior LOB while therapist donned BLE knee high TED hose and shoes total assist. L squat pivot EOB>w/c with mod assist for lifting/pivoting hips, L LE placement/positioning, and pt self-selecting holding onto therapist with R UE as opposed  to pushing up with that hand as instructed. Pt again declines going to therapy gym at this time due to fatigue, but is agreeable to remain sitting in w/c until after dinner. Pt left seated in TIS w/c with needs in reach, L UE supported on tray with word search set-up for pt to work on, sign placed on pt's tray notifying her of dinner time, and seat belt alarm on. Missed 30 minutes of skilled physical therapy.  Discussed with nurse that pt appears to have decreased awareness/orientation compared to baseline - discussed may be due to lack of sleep last night or possible other medical cause - will continue monitoring.   Therapy Documentation Precautions:  Precautions Precautions: Fall Precaution Comments: L hemiparesis Restrictions Weight Bearing Restrictions: No   Pain: No reports of pain throughout session.   Therapy/Group: Individual Therapy  CTawana Scale, PT, DPT, NCS, CSRS 06/21/2021, 3:14 PM

## 2021-06-21 NOTE — Progress Notes (Signed)
Speech Language Pathology Daily Session Note  Patient Details  Name: Vickie Mckee MRN: [id number] Date of Birth: 05-06-1931  Today's Date: 06/21/2021 SLP Individual Time: 0730-0830 SLP Individual Time Calculation (min): 60 min  Short Term Goals: Week 3: SLP Short Term Goal 1 (Week 3): Patient will demonstrate orientation x4 with sup A verbal cues to refer to compensatory aids SLP Short Term Goal 2 (Week 3): Patient will demonstrate problem solving for basic tasks with sup verbal cues SLP Short Term Goal 3 (Week 3): Patient will tolerate current diet with minimal-to-no overt s/sx of aspiration with min A verbal cues for use of swallowing compensatory strategies SLP Short Term Goal 4 (Week 3): Patient will sustain attention for 8 minute intervals with min A verbal redirection SLP Short Term Goal 5 (Week 3): Patient will retain simple novel information following 3 minute delay with 75% accuracy and min A verbal cues  Skilled Therapeutic Interventions: Patient agreeable to skilled ST intervention with focus on cognitive and swallowing goals. Patient received sleeping on arrival. She was more difficult to arouse this morning, however after elevating HOB, turning on lights, providing a warm wash cloth, and playing patient preferred music (50's classics) she maintained alertness to participate in session and consume morning meal. Patient required sup A verbal cues for implementation of safe swallow strategies including limiting bites to one-at-a-time and performing lingual sweep. Patient exhibited slight-to-mild pocketing in left buccal cavity, and slight anterior labial spillage with liquids on left. No overt s/sx of aspiration observed across duration of meal. Vocal quality remained clear throughout. Facilitated orientation in which patient was initially oriented to person only. She initially thought she was at her friends home and discussed watching a movie with friends last night, however after  extended wait time and contextual clues, she acknowledged she was in the hospital and recalled she had a stroke. She required sup A verbal cues to refer orientation visuals to locate month and year. Although she was unable to recall month and year after 3 minute delay, she was able to recall there were visuals placed in her room to help her recall this information and was able to locate such information independently up to 8 minutes after it was initially discussed. Patient was left in bed with alarm activated and immediate needs within reach at end of session. Continue per current plan of care.      Pain Pain Assessment Pain Scale: 0-10 Pain Score: 0-No pain  Therapy/Group: Individual Therapy  Patty Sermons 06/21/2021, 8:33 AM

## 2021-06-21 NOTE — Progress Notes (Signed)
Speech Language Pathology Weekly Progress and Session Note  Patient Details  Name: Vickie Mckee MRN: [id number] Date of Birth: [birth year]  Beginning of progress report period: June 16, 2021 End of progress report period: June 22, 2021  Today's Date: 06/22/2021 SLP Individual Time: 1000-1100 SLP Individual Time Calculation (min): 60 min  Short Term Goals: Week 3: SLP Short Term Goal 1 (Week 3): Patient will demonstrate orientation x4 with sup A verbal cues to refer to compensatory aids SLP Short Term Goal 1 - Progress (Week 3): Progressing toward goal SLP Short Term Goal 2 (Week 3): Patient will demonstrate problem solving for basic tasks with sup verbal cues SLP Short Term Goal 2 - Progress (Week 3): Met SLP Short Term Goal 3 (Week 3): Patient will tolerate current diet with minimal-to-no overt s/sx of aspiration with min A verbal cues for use of swallowing compensatory strategies SLP Short Term Goal 3 - Progress (Week 3): Met SLP Short Term Goal 4 (Week 3): Patient will sustain attention for 8 minute intervals with min A verbal redirection SLP Short Term Goal 4 - Progress (Week 3): Met SLP Short Term Goal 5 (Week 3): Patient will retain simple novel information following 3 minute delay with 75% accuracy and min A verbal cues SLP Short Term Goal 5 - Progress (Week 3): Progressing toward goal  New Short Term Goals: Week 4: SLP Short Term Goal 1 (Week 4): ST=LTG due to ELOS  Weekly Progress Updates: Patient has met 3 of 5 short term goals this reporting period. Patient has made moderate progress with ST over the past week due to improved tolerance of current diet and carry over of safe swallow techniques, improved speech intensity, sustained attention, and orientation with use of visual aids. Patient continues to exhibit drowsiness and fatigue which impacts her attention and duration of participation during tasks, functional recall, and basic problem solving abilities. She currently  requires sup-min A level for basic cognitive tasks. She is currently tolerating Dys 2 textures and thin liquids with minimal overt s/sx of aspiration at supervision level for all meals. Initiated Dys 3 trials today.  Patient/family education is ongoing. Recommend continued skilled ST intervention to maximize swallow, speech, and cognitive function and functional independence prior to discharge. Continue progression toward established LTGs set at sup-to-min A assist level overall.    Intensity: Minumum of 1-2 x/day, 30 to 90 minutes Frequency: 3 to 5 out of 7 days Duration/Length of Stay: 09/09 Treatment/Interventions: Cognitive remediation/compensation;Environmental controls;Internal/external aids;Speech/Language facilitation;Cueing hierarchy;Dysphagia/aspiration precaution training;Functional tasks;Patient/family education;Medication managment;Therapeutic Activities  Daily Session Skilled Therapeutic Interventions: Patient agreeable to skilled ST intervention with focus on cognitive goals. Facilitated orientation in which patient was oriented to person, place, and situation independent of cues. She required sup A verbal cues to refer to orientation visuals for time (month and year) and mod A verbal reminders to retain this information throughout session. Took patient outside for majority of today's session. Facilitated therapeutic PO trials of Dys 3 textures in which patient consumed with mildly prolonged yet effective mastication, mildly prolonged AP transit, and mild oral residue. She exhibited immediate cough x1 which occurred during liquid rinse, and delayed cough x1 without liquid rinse. Overall, patient exhibits improved oral phase, particular improvements with mastication effectiveness, and no observed oral fatigue. Recommend continuation of Dys 2 textures at this time however patient would be appropriate to continue Dys 3 PO trials. Facilitated working memory, sustained attention, and problem  solving skills with novel card game "UNO." Patient sustained attention for task  for 10 minute duration with minimal-to-no redirection min A fading to sup A verbal cues for processing.  Patient was returned to room and passed off to PT for following session where she was observed verbalizing accurate details that were discussed approximately 20 minutes prior having had several instances of this information repeated. Continue per current plan of care.      General    Pain Pain Assessment Pain Scale: 0-10 Pain Score: 0-No pain  Therapy/Group: Individual Therapy  Patty Sermons 06/22/2021, 12:52 PM

## 2021-06-21 NOTE — Progress Notes (Signed)
Patient noted to have slept poorly this shift. Pt slept for 3 consecutive hours. Pt noted to be quiet but awake on hourly rounds.

## 2021-06-21 NOTE — Progress Notes (Addendum)
Occupational Therapy Session Note  Patient Details  Name: Vickie Mckee MRN: [id number] Date of Birth: 1931/01/29  Today's Date: 06/21/2021 OT Individual Time: 1000-1100 OT Individual Time Calculation (min): 60 min    Short Term Goals: Week 3:  OT Short Term Goal 1 (Week 3): Pt will complete UB dressing with mod assist for donning a pullover shirt following hemi dressing techniques. OT Short Term Goal 2 (Week 3): Pt will complete LB bathing with max assist sit to stand. OT Short Term Goal 3 (Week 3): Pt will complete sit to stand for toileting tasks with mod assist.  Skilled Therapeutic Interventions/Progress Updates:    Pt sitting up in w/c, no c/o pain, intermittently commenting "Im just so tired today", but agreeable to participating throughout OT session.  Pt urgently requesting to toilet.    Transported to bathroom and completed squat pivot to drop arm commode with mod assist.  Pt stood with max assist and dependent clothing management.  Pt had continent episode of bowel and bladder while sitting on commode with CGA due to BLE not touching floor. Max assist to stand while pt completed pericare.  Squat pivot back to w/c with mod assist.    Pt transported to sink where pt doffed button down shirt and bathed UB with min assist and step by step Vcs.  Educated pt on opening of soap bottle using hemitechnique.  Pt stood at sinkside and pulled pants and brief down over hips with mod assist +2.  While in standing position pt needed max multimodal cues and manual positioning to reduce forward lean, downward gaze, and lateral trunk compensations to facilitate functional upright stance.  Pt also needing external target to increase activation of left hip flexors when threading leg in and out of pant leg as well as raising LLE onto leg rest.    Pt transported to ortho gym to focus on sit<>stand block practices and weight shifting techniques.  Mirror placed in front of pt to increase feedback to support  motor learning.  Pt required mod assist +2 to complete transfers.  Improved anterior weight shifting and upright standing posture on second attempt after visual demonstration and multimodal cues provided.  Pt returned to room, call bell in reach, LUE supported by pillow, seat belt alarm on at end of session.  Therapy Documentation Precautions:  Precautions Precautions: Fall Precaution Comments: L hemiparesis Restrictions Weight Bearing Restrictions: No   Therapy/Group: Individual Therapy  Ezekiel Slocumb 06/21/2021, 12:18 PM

## 2021-06-21 NOTE — Patient Care Conference (Signed)
Inpatient RehabilitationTeam Conference and Plan of Care Update Date: 06/21/2021   Time: 11:48 AM    Patient Name: Vickie Mckee      Medical Record Number: [id number]  Date of Birth: 1931/01/20 Sex: Female         Room/Bed: 4M06C/4M06C-01 Payor Info: Payor: MEDICARE / Plan: MEDICARE PART A AND B / Product Type: *No Product type* /    Admit Date/Time:  06/01/2021  1:51 PM  Primary Diagnosis:  Left hemiplegia [facility])  Hospital Problems: Principal Problem:   Left hemiplegia ([place]) Active Problems:   Right pontine cerebrovascular accident ([place])   Hypoalbuminemia due to protein-calorie malnutrition ([place])   Hypokalemia   Dysphagia, post-stroke    Expected Discharge Date: Expected Discharge Date: 06/30/21  Team Members Present: Physician leading conference: Dr. Delice Lesch Social Worker Present: Erlene Quan, BSW Nurse Present: Dorien Chihuahua, RN PT Present: Page Spiro, PT OT Present: Clyda Greener, OT SLP Present: Sherren Kerns, SLP PPS Coordinator present : Gunnar Fusi, SLP     Current Status/Progress Goal Weekly Team Focus  Bowel/Bladder   Incontinent of B/B. LBM-8/30  Regain control of B/B  Toileting every 2h while awake, assist with toileting needs   Swallow/Nutrition/ Hydration   Diet advanced to Dys 2, thin liquids. Meds whole in pure per MBS recommendations. Intermittent coughing. Conts to require full supervision for attention to task and implementation of safe swallow strategies in which she required min-to-mod verbal cues.  sup A  Tolerance of current diet, implementation of safe swallow strategies during meals, recall of strategies   ADL's   Min assist for UB bathing with mod to max for UB dressing.  Min to mod for dynamic trunk control with total +2 (pt 25%) for LB selfcare sit to stand.  Max assist for squat pivot transfers to the wheelchair.  Still with low endurance as well.  Brunnstrum stage II in the left arm and stage I in the hand.  min to mod assist   selfcare retraining, transfer training, balance retraining, DME educaiton, neuromuscular re-education, therapeutic exercise, pt/family education   Mobility   rolling L supervision using bedrail, rolling R max assist, supine>sitting with max assist using bedrial, mod/max assist squat pivot transfers, max assist sit<>stands with R UE support, gait up to 42f overground with +2 mod assist and improved L LE gait mechanics  min assist overall - will monitor if need to downgrade  activity tolerance, L LE NMR, bed mobility training, transfer training, standing tolerance and balance, high intensity gait training, pt/family education, trunk control   Communication   sup A to increase vocal loudness  sup A  reminders to increase vocal intensity, breath support techniques. Not a direct area of focus as patient has demonstrated improvement and able to effectively communicate functional needs without difficulty.   Safety/Cognition/ Behavioral Observations  min A for basic, mod-to-max for sustained attention and recall. Patient continues to remain limited by fatigue and decreased endurance.  min A  carry over for use of orientation visuals, sustained attention, basic problem solving, working memory.   Pain   Denies pain, however expresses pain (grimacing) with application of orthotic devices  Pain level <3/10.  Assess and address pain every shift and prn   Skin   No skin breakdown noted  Maintain skin integrity  Assess and address skin every shift and prn.     Discharge Planning:  D/C to SNF on 9/9   Team Discussion: Patient reports "tired" and easily fatigued; MD adjusting sleep aides/medications. Activity  tolerance has improved. Patient with intermittent labile emotions, crying and increased confusion in afternoons.  Patient on target to meet rehab goals: Currently requires min assist for problem solving and mod - max assist for memory and recall. Needs min assist for upper body bathing and mod - max  assist for upper body dressing. Requires total assist  + 2 for lower body care. Needs max assist for supine to sit. Requires mod assist for squat pivot transfers and completes sit - stand with total assist. Has a kyphotic stance when upright. Able to ambulate 49' with +2 mod assist, and assistance for advancement of left LE. Able to manage D2 thin liqud diet with intermittent coughing but no signs of aspiration on MBS. Needs reminders for intermittent clearing and checking for pocketing. Goals for SLP set at supervision.   *See Care Plan and progress notes for long and short-term goals.   Revisions to Treatment Plan:  Downgraded goals to max assist overall   Teaching Needs: Safety, medications, transfers, toileting, etc.  Current Barriers to Discharge: Decreased caregiver support, Home enviroment access/layout, and Incontinence  Possible Resolutions to Barriers: Family education     Medical Summary Current Status: Left-sided hemiparesis with dysarthria/dysphagia secondary to right pontine infarction as well as multiple small remote bilateral cerebellar lacunar infarcts  Barriers to Discharge: Medical stability   Possible Resolutions to [employer] Focus: Therapies, advance diet as tolerated, follow labs - K+, optimize sleep   Continued Need for Acute Rehabilitation Level of Care: The patient requires daily medical management by a physician with specialized training in physical medicine and rehabilitation for the following reasons: Direction of a multidisciplinary physical rehabilitation program to maximize functional independence : Yes Medical management of patient stability for increased activity during participation in an intensive rehabilitation regime.: Yes Analysis of laboratory values and/or radiology reports with any subsequent need for medication adjustment and/or medical intervention. : Yes   I attest that I was present, lead the team conference, and concur with the  assessment and plan of the team.   Margarito Liner 06/21/2021, 2:28 PM

## 2021-06-22 NOTE — Progress Notes (Signed)
Physical Therapy Session Note  Patient Details  Name: Vickie Mckee MRN: [id number] Date of Birth: September 18, 1931  Today's Date: 06/22/2021 PT Individual Time: 1100-1200 PT Individual Time Calculation (min): 60 min   Short Term Goals: Week 3:  PT Short Term Goal 1 (Week 3): Pt will perform supine<>sit with mod assist PT Short Term Goal 2 (Week 3): Pt will perform sit<>stands with mod assist of 1 PT Short Term Goal 3 (Week 3): Pt will perform bed<>chair transfers with mod assist of 1 PT Short Term Goal 4 (Week 3): Pt will ambulate at least 39f using LRAD with max assist of 1 (+2 w/c follow for safety if needed)  Skilled Therapeutic Interventions/Progress Updates:    Pt received sitting in w/c [place] agreeable to therapy session despite reporting fatigue. Transported to/from gym in w/c for time management and energy conservation.   Donned L LE ankle DF assist ACE wrap. Sit>stand w/c>R UE support on litegait with heavy min assist for lifting to stand and guarding L knee, demos delayed hip/knee extension but improving ability to power up with decreased assist. Standing with mod assist for balance and blocking L knee due to difficulty sustaining L knee extension while +2 assist donned litegait harness. Applied ACE wrap to support L UE during gait training.  Overground gait training ~574f ~10080fith litegait partial body weight support, +2 assist for litegait management and heavy mod assist for L LE gait mechanics - pt able to advance L LE ~50-60% of the way during swing requiring min/mod assist to advance fully (with fatigue requires increased assist) - requires mod/max assist to facilitate L hip/knee extension during stance and L weight shift to allow increased R LE step length as pt tending to perform step-to gait pattern leading with L LE - cuing throughout for increased hip extension and upright posture. Pt with shorter gait distances today due to overall reports of increased  fatigue - listened to music for increased energy during gait. Standing as above doffed litegait harness.  Pt appears to have her typical cognitive presentation this morning compared to yesterday evening though does appear to continue with increased fatigue, but anticipate this is due to poor sleep the past two nights.  Focused on block practice squat pivot transfers w/c<>EOM with pt demoing recall/carryover of prior training with increased anterior trunk lean - does have difficulty rotating hips towards R compared to L - light mod assist required throughout for lifting/pivoting hips. At end of session pt left seated in w/c with needs in reach, seat belt alarm on, and L UE therapeutically positioned on pillows.  Therapy Documentation Precautions:  Precautions Precautions: Fall Precaution Comments: L hemiparesis Restrictions Weight Bearing Restrictions: No   Pain:  No reports of pain throughout session.    Therapy/Group: Individual Therapy  CarTawana ScalePT, DPT, NCS, CSRS 06/22/2021, 7:57 AM

## 2021-06-22 NOTE — Plan of Care (Signed)
  Problem: RH Balance Goal: LTG Patient will maintain dynamic standing with ADLs (OT) Description: LTG:  Patient will maintain dynamic standing balance with assist during activities of daily living (OT)  Flowsheets (Taken 06/22/2021 1152) LTG: Pt will maintain dynamic standing balance during ADLs with: (goal downgraded based on slower progress than originally expected) Maximal Assistance - Patient 25 - 49% Note: goal downgraded based on slower progress than originally expected

## 2021-06-22 NOTE — Progress Notes (Signed)
Pt slept for 7 hours this shift. Pt toileted by staff and was continent of urine. Given tylenol at hs for c/o left leg pain-effective. Pt allowed the application of splint and PRAFO boot in early morning hours. Tolerating at this time. Pt noted to be anxious and confused at beginning of shift. Requesting to go home and insistent that her family is unaware that she is here. Emotional support, reorientation and reassurance provided with positive effects noted.

## 2021-06-22 NOTE — Progress Notes (Signed)
Occupational Therapy Weekly Progress Note  Patient Details  Name: Vickie Mckee MRN: [id number] Date of Birth: 1931/02/18  Beginning of progress report period: June 10, 2021 End of progress report period: June 22, 2021  Today's Date: 06/22/2021 OT Individual Time: 0802-0901 OT Individual Time Calculation (min): 59 min    Patient has met 1 of 3 short term goals.  Pt continues to make steady but slow progress with OT.  She is completing UB selfcare with min assist for bathing and mod to max for dressing.  LB bathing is max assist sit to stand at the sink with dressing at the same level.  She is able to maintain static sitting balance with supervision and dynamic sitting balance with mod assist during selfcare tasks EOB.  LUE hemiparesis continues with Brunnstrum stage II movement in the left arm and stage I in the hand.  Vickie Mckee currently completes squat pivot transfers at mod to max assist level with stand pivot transfers at max assist to the toilet and wheelchair.  Endurance continues to be limited with pt consistently stating during sessions that she is fatigued and tired.  She is easily re-directed however.  Recommend continued CIR level therapy with expectations of discharge on 9/9 to SNF at this time secondary to family not being able to consistently provide 24 hour supervision.  Goals to be downgraded to mod to max assist to reflect current progress.   Patient continues to demonstrate the following deficits: muscle weakness, muscle joint tightness, and muscle paralysis, impaired timing and sequencing, unbalanced muscle activation, and decreased coordination, decreased midline orientation, decreased initiation, decreased problem solving, and decreased memory, and decreased sitting balance, decreased standing balance, decreased postural control, hemiplegia, and decreased balance strategies and therefore will continue to benefit from skilled OT intervention to enhance overall performance  with BADL and Reduce care partner burden.  Patient not progressing toward long term goals.  See goal revision..  Continue plan of care.  OT Short Term Goals Week 4:  OT Short Term Goal 1 (Week 4): Continue working on established LTGs at [employer] to [employer].  Skilled Therapeutic Interventions/Progress Updates:    Session 1: [phone])  Pt worked on bathing and dressing sit to stand from the EOB.  Max assist for supine to sit secondary to decreased ability to roll to the right side of the bed and then transition from sidelying to sitting.  Max demonstrational cueing for technique to manage the RLE and RUE.  She was able to maintain static sitting balance with supervision and then dynamic sitting with mod assist while completing ADL tasks.  Flexed head and trunk with mod demonstrational cueing for upright posture.  Max hand over hand assist was needed for integration of the LUE to stabilize objects when opening them as well as for washing the RUE.  Max assist for sit to stand with decreased upright posture and decreased hip extension noted as well.  Pusher tendencies to the left are also still present with standing.  Still needs total +2 (pt 30%) for washing buttocks and front peri area as well as for pulling items up over her hips.  Total assist for TEDS with max assist for donning and tying her shoes.  Finished with squat pivot transfer to the left with mod assist to transfer to the wheelchair.  She was able to comb he hair with setup at the sink before being left sitting up with safety belt in place and call button and phone in reach.  Session 2: (2010-0712)  Pt finished toileting with nurse tech to start session.  She then worked with therapy with focus on standing at the sink.  Max facilitation was needed for sit to stand and to maintain standing.  Flexed trunk and head were noted, but with UE support on the sink, she was able to extend her trunk better.  Had her work on washing her hands while standing  as well as combing her hair.  She completed two intervals of standing for approximately two mins each.  While resting therapist had her work on LUE AAROM/PROM exercises with 1 set of 10 reps for shoulder flexion, elbow flexion/extension, and wrist extension.  Noted trace movement with elbow extension only.  Finished session with pt in the wheelchair with the call button and phone in reach and safety belt in place.    Therapy Documentation Precautions:  Precautions Precautions: Fall Precaution Comments: L hemiparesis Restrictions Weight Bearing Restrictions: No  Pain: Pain Assessment Pain Scale: 0-10 Pain Score: 0-No pain ADL: See Care Tool Section for some details of mobility and selfcare   Therapy/Group: Individual Therapy  Vickie Mckee OTR/L 06/22/2021, 9:17 AM

## 2021-06-22 NOTE — Evaluation (Signed)
Recreational Therapy Assessment and Plan  Patient Details  Name: Vickie Mckee MRN: [id number] Date of Birth: 10-26-1930 Today's Date: 06/22/2021  Rehab Potential:  Good ELOS:   d/c 9/9 SNF  Assessment   Hospital Problem: Principal Problem:   Left hemiplegia ([place]) Active Problems:   Right pontine cerebrovascular accident [facility])     Past Medical History:      Past Medical History:  Diagnosis Date   Decreased sense of smell      and taste-negative CT scan-2011   Depression     Essential hypertension     GERD (gastroesophageal reflux disease)     Heart murmur      heard first time 4/12   History of mammogram      2010 and she does not want to repeat them anymore   HOCM (hypertrophic obstructive cardiomyopathy) ([place])      a. Dx by echo 05/2014.   Hypothyroidism     IBS (irritable bowel syndrome)     Impaired fasting glucose     Low back pain     Lumbar radiculopathy      with negative MRI (1991)   Postmenopausal      with osteopenia, bisphosphonates 1/05-1/11, improved osteopenia 4/12-repeat planned late 2015   Shingles      2015   Thyroid nodule     Vitamin D deficiency      repleted on weekly vitamin D 50000 iu    Past Surgical History:       Past Surgical History:  Procedure Laterality Date   ABDOMINAL HYSTERECTOMY       BREAST BIOPSY        x2   CATARACT EXTRACTION        removal with IOL bilateral   GLAUCOMA SURGERY        laser   skin cancer removal       THYROIDECTOMY        subtotal      Assessment & Plan Clinical Impression: Patient is a 87 y.o. year old female with recent admission to the hospital on 05/29/2021 with acute onset of left-sided weakness and dysarthria as well as left lateral gaze and 1 episode of vomiting.  Cranial CT scan negative for acute changes.  CT angiogram of head and neck showed no large vessel occlusion or high-grade stenosis.  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  1 mm  aneurysm arising from the anterior communicating artery.  Patient did not receive tPA.  MRI showed acute right pontine infarction.  Multiple small remote bilateral cerebellar lacunar infarcts..  Patient transferred to CIR on 06/01/2021 .     Pt presents with decreased activity tolerance, decreased functional mobility, decreased balance, decreased coordination, decreased vision,left inattention, decreased attention, decreased awareness, decreased problem solving, decreased safety awareness, decreased memory, and delayed processing Limiting pt's independence with leisure/community pursuits.  Met with pt today to discuss leisure interests, activity analysis/modifications.  Pt required external visual cues/mod cues for orientation and awareness.     Plan  Min 1 TR session >20 minutes per week during LOS  Recommendations for other services: None   Discharge Criteria: Patient will be discharged from TR if patient refuses treatment 3 consecutive times without medical reason.  If treatment goals not met, if there is a change in medical status, if patient makes no progress towards goals or if patient is discharged from hospital.  The above assessment, treatment plan, treatment  alternatives and goals were discussed and mutually agreed upon: by patient  Sobieski 06/22/2021, 4:12 PM

## 2021-06-22 NOTE — Progress Notes (Signed)
PROGRESS NOTE   Subjective/Complaints:  Pt reports cannot "wake up"- However did see 10 minutes later, up and being fed/eating with NT in room- was very awake.  Did sleep at least 7 hours overnight. Calmer per nursing, but still got agitated x1- calmed with positive encouragement.   ROS: limited due to cognition  Objective:   No results found. No results for input(s): WBC, HGB, HCT, PLT in the last 72 hours.  Recent Labs    06/21/21 0510  NA 137  K 3.7  CL 104  CO2 27  GLUCOSE 111*  BUN 31*  CREATININE 1.08*  CALCIUM 9.1    Intake/Output Summary (Last 24 hours) at 06/22/2021 0925 Last data filed at 06/22/2021 0756 Gross per 24 hour  Intake 440 ml  Output --  Net 440 ml        Physical Exam: Vital Signs Blood pressure 130/60, pulse 76, temperature (!) 97.5 F (36.4 C), temperature source Oral, resp. rate 16, height '5\' 2"'$  (1.575 m), weight 53.5 kg, SpO2 99 %.   General: awake,but sleepy; laying in bed; NT entered room at end;  NAD HENT: conjugate gaze; oropharynx moist CV: regular rate; no JVD Pulmonary: CTA B/L; no W/R/R- good air movement GI: soft, NT, ND, (+)BS Psychiatric: sleepy, but did speak coherently as much as normal for her Neurological: Ox1- slightly confused.   Skin: Warm and dry.  Intact. Musc: No edema in extremities.  No tenderness in extremities. Neuro: Somnolent Motor: No movement noted in left upper extremity  Assessment/Plan: 1. Functional deficits which require 3+ hours per day of interdisciplinary therapy in a comprehensive inpatient rehab setting. Physiatrist is providing close team supervision and 24 hour management of active medical problems listed below. Physiatrist and rehab team continue to assess barriers to discharge/monitor patient progress toward functional and medical goals  Care Tool:  Bathing    Body parts bathed by patient: Left arm, Right upper leg, Left upper leg,  Abdomen, Chest, Face, Front perineal area, Right lower leg   Body parts bathed by helper: Left lower leg, Buttocks, Right arm     Bathing assist Assist Level: Maximal Assistance - Patient 24 - 49%     Upper Body Dressing/Undressing Upper body dressing   What is the patient wearing?: Pull over shirt    Upper body assist Assist Level: Maximal Assistance - Patient 25 - 49%    Lower Body Dressing/Undressing Lower body dressing      What is the patient wearing?: Incontinence brief, Pants     Lower body assist Assist for lower body dressing: 2 Helpers     Toileting Toileting    Toileting assist Assist for toileting: 2 Helpers     Transfers Chair/bed transfer  Transfers assist     Chair/bed transfer assist level: Moderate Assistance - Patient 50 - 74% (squat pivot)     Locomotion Ambulation   Ambulation assist   Ambulation activity did not occur: Safety/medical concerns  Assist level: 2 helpers (mod A of 1 and +2 UE support) Assistive device: Hand held assist Max distance: 20f   Walk 10 feet activity   Assist  Walk 10 feet activity did not occur: Safety/medical concerns  Assist level: 2 helpers (mod A of 1 and +2 UE support) Assistive device: Hand held assist   Walk 50 feet activity   Assist Walk 50 feet with 2 turns activity did not occur: Safety/medical concerns         Walk 150 feet activity   Assist Walk 150 feet activity did not occur: Safety/medical concerns         Walk 10 feet on uneven surface  activity   Assist Walk 10 feet on uneven surfaces activity did not occur: Safety/medical concerns         Wheelchair     Assist Is the patient using a wheelchair?: Yes Type of Wheelchair: Manual Wheelchair activity did not occur: Safety/medical concerns         Wheelchair 50 feet with 2 turns activity    Assist    Wheelchair 50 feet with 2 turns activity did not occur: Safety/medical concerns       Wheelchair  150 feet activity     Assist  Wheelchair 150 feet activity did not occur: Safety/medical concerns       Blood pressure 130/60, pulse 76, temperature (!) 97.5 F (36.4 C), temperature source Oral, resp. rate 16, height '5\' 2"'$  (1.575 m), weight 53.5 kg, SpO2 99 %.      Medical Problem List and Plan: 1.   Left-sided hemiparesis with dysarthria/dysphagia secondary to right pontine infarction as well as multiple small remote bilateral cerebellar lacunar infarcts Continue CIR  Cont PRAFO, WHO nightly Con't PT< OT and SLP/CIR 2.  Impaired mobility: Continue Lovenox for DVT prophylaxis             -antiplatelet therapy: Aspirin 81 mg daily and Plavix 75 mg day x1 month then Plavix alone Flexor withdrawal spasms LLE-zanaflex at night but may be causing am fatigue will d/c 3. Pain Management/chronic back pain: Tylenol as needed  9/1- doesn't c/o pain when asked- con't tylenol prn 4. Depression: Continue Zoloft 100 mg daily, Aricept 5 mg nightly             -antipsychotic agents: Seroquel 12.5 mg q evening.  5. Neuropsych: This patient is NOT capable of making decisions on her own behalf. 6. Skin/Wound Care: Routine skin checks 7. Fluids/Electrolytes/Nutrition: Routine in and outs 8. Post stroke dysphagia.   D2 thins  Follow-up speech therapy.  Monitor hydration 9/1- Cr 1.08- stable- con't regimen 9.  Hypertension.  Patient on verapamil 240 mg daily prior to admission as well as HCTZ 25 mg daily.     Vitals:   06/21/21 1321 06/22/21 0551  BP: (!) 145/65 130/60  Pulse: 91 76  Resp: 18 16  Temp: 97.8 F (36.6 C) (!) 97.5 F (36.4 C)  SpO2: 98% 99%   9/1- relatively controlled- con't regimen 10.  Hyperlipidemia.  Lipitor 11.  Hypothyroidism.  TSH 2.093 .Continue Synthroid 12.  Constipation.  MiraLAX scheduled daily.  Senokot S2 tablets nightly  13. Hypokalemia: Supplementation started   Potassium 3.7 on 8/31 14. Crackles and coughing with food intake: placed nursing order for must  be up with meals, resolved  15.  Hypoalbuminemia  Supplement initiated on 8/13 16.  Cognitive deficits post stroke - has evidence of small vessel disease on imaging as well as the new pontine infarct   8/29- con't memory aids on wall.  17. R upper tooth pain-  ?  Resolved 18. Sedation with agitation  8/26- will stop melatonin- if this doesn't help, might benefit from amantadine.    Amantadine  100 daily increase to twice daily/31.   Moved up seroquel to 2000  9/1- seroquel earlier appeared to help her sleep longer and more awake this AM.   LOS: 21 days A FACE TO FACE EVALUATION WAS PERFORMED  Katherleen Folkes 06/22/2021, 9:25 AM

## 2021-06-23 NOTE — Progress Notes (Signed)
Physical Therapy Session Note  Patient Details  Name: Vickie Mckee MRN: [id number] Date of Birth: 07-19-1931  Today's Date: 06/23/2021 PT Individual Time: 1117-1213 PT Individual Time Calculation (min): 56 min   Short Term Goals: Week 3:  PT Short Term Goal 1 (Week 3): Pt will perform supine<>sit with mod assist PT Short Term Goal 2 (Week 3): Pt will perform sit<>stands with mod assist of 1 PT Short Term Goal 3 (Week 3): Pt will perform bed<>chair transfers with mod assist of 1 PT Short Term Goal 4 (Week 3): Pt will ambulate at least 26f using LRAD with max assist of 1 (+2 w/c follow for safety if needed)  Skilled Therapeutic Interventions/Progress Updates:  Patient seated upright in w/c on entrance to room. Patient alert and agreeable to PT session. Relates need to toilet.   Therapeutic Activity: Transfers: Patient performed toilet transfer with use of STEDY requiring Mod A initially and is able to perform sit<>stand with very light Min A. Provided verbal cues for initiation and effort. Pt attempts to toilet and is unable without eventual guidance to provide external pressure along with internal muscular pressure to bladder. Total A for pericare and clothing mgmt.   Neuromuscular Re-ed: NMR facilitated during session with focus on sit <>stand technique, muscle activation, motor control, standing balance, midline orientation and proprioception. Pt guided in sit<>stand with focus on technique with NDT facilitation and vc. Pt provided with cues for upright posture in sitting and anterior rotation of pelvis. With rotation of pelvis, pt guided into continuation of movement with facilitation at ribcage and tc at glutes for full upright stance. Pt initially requires Mod A +2 and improves to Min A +2. Continues to require vc/tc for all steps with each performance. Once in standing, pt guided in fwd/ bkwd step of LLE with Mod A and pt demonstrating improving activation with assist into weight shift  onto RLE.  NMR performed for improvements in motor control and coordination, balance, sequencing, judgement, and self confidence/ efficacy in performing all aspects of mobility at highest level of independence.   Patient seated upright  in w/c with lunch arrival at end of session with brakes locked, belt alarm set, and all needs within reach. Dtrs present to assist with lunch feeding.   Therapy Documentation Precautions:  Precautions Precautions: Fall Precaution Comments: L hemiparesis Restrictions Weight Bearing Restrictions: No  Pain: Patient with no pain complaint throughout session.   Therapy/Group: Individual Therapy  JAlger SimonsPT, DPT 06/23/2021, 7:45 AM

## 2021-06-23 NOTE — Progress Notes (Signed)
PROGRESS NOTE   Subjective/Complaints: Patient seen sitting up in her chair working with therapy this morning.  She states she slept well overnight, still sleepy this morning.  ROS: limited due to cognition  Objective:   No results found. No results for input(s): WBC, HGB, HCT, PLT in the last 72 hours.  Recent Labs    06/21/21 0510  NA 137  K 3.7  CL 104  CO2 27  GLUCOSE 111*  BUN 31*  CREATININE 1.08*  CALCIUM 9.1     Intake/Output Summary (Last 24 hours) at 06/23/2021 1031 Last data filed at 06/23/2021 0954 Gross per 24 hour  Intake 480 ml  Output 400 ml  Net 80 ml         Physical Exam: Vital Signs Blood pressure (!) 116/57, pulse 73, temperature 97.7 F (36.5 C), temperature source Oral, resp. rate 15, height '5\' 2"'$  (1.575 m), weight 58.7 kg, SpO2 93 %. Constitutional: No distress . Vital signs reviewed. HENT: Normocephalic.  Atraumatic. Eyes: EOMI. No discharge. Cardiovascular: No JVD.  RRR. Respiratory: Normal effort.  No stridor.  Bilateral clear to auscultation. GI: Non-distended.  BS +. Skin: Warm and dry.  Intact. Psych: Normal mood.  Normal behavior. Musc: No edema in extremities.  No tenderness in extremities. Neurological: Alert and oriented with cues  Motor: No movement noted on left side No increase in tone noted  Assessment/Plan: 1. Functional deficits which require 3+ hours per day of interdisciplinary therapy in a comprehensive inpatient rehab setting. Physiatrist is providing close team supervision and 24 hour management of active medical problems listed below. Physiatrist and rehab team continue to assess barriers to discharge/monitor patient progress toward functional and medical goals  Care Tool:  Bathing    Body parts bathed by patient: Left arm, Right upper leg, Left upper leg, Abdomen, Chest, Face, Front perineal area, Right lower leg   Body parts bathed by helper: Left lower  leg, Buttocks, Right arm     Bathing assist Assist Level: Maximal Assistance - Patient 24 - 49%     Upper Body Dressing/Undressing Upper body dressing   What is the patient wearing?: Pull over shirt    Upper body assist Assist Level: Maximal Assistance - Patient 25 - 49%    Lower Body Dressing/Undressing Lower body dressing      What is the patient wearing?: Incontinence brief, Pants     Lower body assist Assist for lower body dressing: 2 Helpers     Toileting Toileting    Toileting assist Assist for toileting: 2 Helpers     Transfers Chair/bed transfer  Transfers assist     Chair/bed transfer assist level: Moderate Assistance - Patient 50 - 74% (squat pivot)     Locomotion Ambulation   Ambulation assist   Ambulation activity did not occur: Safety/medical concerns  Assist level: 2 helpers Assistive device: Lite Gait Max distance: 168f   Walk 10 feet activity   Assist  Walk 10 feet activity did not occur: Safety/medical concerns  Assist level: 2 helpers (mod A of 1 and +2 UE support) Assistive device: Hand held assist   Walk 50 feet activity   Assist Walk 50 feet with 2  turns activity did not occur: Safety/medical concerns         Walk 150 feet activity   Assist Walk 150 feet activity did not occur: Safety/medical concerns         Walk 10 feet on uneven surface  activity   Assist Walk 10 feet on uneven surfaces activity did not occur: Safety/medical concerns         Wheelchair     Assist Is the patient using a wheelchair?: Yes Type of Wheelchair: Manual Wheelchair activity did not occur: Safety/medical concerns         Wheelchair 50 feet with 2 turns activity    Assist    Wheelchair 50 feet with 2 turns activity did not occur: Safety/medical concerns       Wheelchair 150 feet activity     Assist  Wheelchair 150 feet activity did not occur: Safety/medical concerns       Blood pressure (!) 116/57,  pulse 73, temperature 97.7 F (36.5 C), temperature source Oral, resp. rate 15, height '5\' 2"'$  (1.575 m), weight 58.7 kg, SpO2 93 %.      Medical Problem List and Plan: 1.   Left-sided hemiplegia with dysarthria/dysphagia secondary to right pontine infarction as well as multiple small remote bilateral cerebellar lacunar infarcts Continue CIR  Cont PRAFO, WHO nightly 2.  Impaired mobility: Continue Lovenox for DVT prophylaxis             -antiplatelet therapy: Aspirin 81 mg daily and Plavix 75 mg day x1 month then Plavix alone Flexor withdrawal spasms LLE-zanaflex at night but may be causing am fatigue DC 3. Pain Management/chronic back pain: Tylenol as needed  Controlled on 9/3 4. Depression: Continue Zoloft 100 mg daily, Aricept 5 mg nightly             -antipsychotic agents: Seroquel 12.5 mg q evening.  5. Neuropsych: This patient is NOT capable of making decisions on her own behalf. 6. Skin/Wound Care: Routine skin checks 7. Fluids/Electrolytes/Nutrition: Routine in and outs 8. Post stroke dysphagia.   D2 thins  Advance diet as tolerated 9.  Hypertension.  Patient on verapamil 240 mg daily prior to admission as well as HCTZ 25 mg daily.     Vitals:   06/22/21 1954 06/23/21 0529  BP: (!) 112/46 (!) 116/57  Pulse: (!) 42 73  Resp: 16 15  Temp: 98 F (36.7 C) 97.7 F (36.5 C)  SpO2: 95% 93%   Relatively soft on 9/3-off of antihypertensives at present 10.  Hyperlipidemia.  Lipitor 11.  Hypothyroidism.  TSH 2.093 .Continue Synthroid 12.  Constipation.  MiraLAX scheduled daily.  Senokot S2 tablets nightly  13. Hypokalemia: Supplementation started   Potassium 3.7 on 8/31, labs ordered for Monday 14. Crackles and coughing with food intake: placed nursing order for must be up with meals, resolved  15.  Hypoalbuminemia  Supplement initiated on 8/13 16.  Cognitive deficits post stroke - has evidence of small vessel disease on imaging as well as the new pontine infarct   8/29- con't  memory aids on wall.  17. R upper tooth pain-  Resolved 18. Sedation with agitation  8/26- will stop melatonin- if this doesn't help, might benefit from amantadine.    Amantadine 100 daily increase to twice daily on 8/31.   Moved up seroquel to 2000  LOS: 22 days A FACE TO FACE EVALUATION WAS PERFORMED  Vickie Mckee Vickie Mckee 06/23/2021, 10:31 AM

## 2021-06-23 NOTE — Progress Notes (Signed)
Occupational Therapy Session Note  Patient Details  Name: Vickie Mckee MRN: [id number] Date of Birth: 1931/08/29  Today's Date: 06/23/2021 OT Individual Time: [id number] OT Individual Time Calculation (min): 58 min    Short Term Goals: Week 4:  OT Short Term Goal 1 (Week 4): Continue working on established LTGs at [employer] to [employer].  Skilled Therapeutic Interventions/Progress Updates:      Session 1: [phone]) Pt worked on bathing and dressing sit to stand from the EOB.  Max assist for supine to sit secondary to decreased ability to roll to the right side of the bed and then transition from sidelying to sitting.  Max demonstrational cueing for technique to manage the RLE and RUE.  She was able to maintain static sitting balance with supervision and then dynamic sitting with min assist this session.  She demonstrated better upright head and trunk posturing this session.    Max hand over hand assist was needed for integration of the LUE to stabilize objects when opening them as well as for washing the RUE.  Max assist for sit to stand with decreased upright posture and decreased hip extension noted as well.  Pusher tendencies to the left are also still present with standing.  Still needs total +2 (pt 30%) for washing buttocks and front peri area as well as for pulling items up over her hips.  Total assist for TEDS with max assist for donning and tying her shoes.  Finished with squat pivot transfer to the left with mod assist to transfer to the wheelchair.  She was able to comb he hair with setup at the sink before being left sitting up with safety belt in place and call button and phone in reach.    Session 2: [phone]) Pt in wheelchair to start with family present.  She was taken to the therapy gym where she transferred to the therapy mat with max assist stand pivot.  Focused on LUE active movement during session with use of a therapy ball and tilted stool.  She was able to complete small movements  of internal and external rotation with left hand placed on the ball which was positioned on top of a sliding board.  She also exhibited active movement of shoulder flexion with elbow extension to push a tilted stool forward to target.  Max demonstrational cueing to maintain upright sitting posture secondary to kyphosis and posterior pelvic tilt.  Pt with noted trace digit flexion as well, which was new for her.  Finished session with max assist stand pivot transfers to the wheelchair and to the bed to complete session.  PRAFO donned while in bed with call button and bed alarm in place.    Therapy Documentation Precautions:  Precautions Precautions: Fall Precaution Comments: L hemiparesis Restrictions Weight Bearing Restrictions: No  Pain: Pain Assessment Pain Scale: 0-10 Pain Score: 0-No pain ADL: See Care Tool Section for some details of mobility and selfcare   Therapy/Group: Individual Therapy  Keonta Alsip OTR/L 06/23/2021, 12:10 PM

## 2021-06-23 NOTE — Progress Notes (Signed)
Speech Language Pathology Daily Session Note  Patient Details  Name: Vickie Mckee MRN: [id number] Date of Birth: 05/21/1931  Today's Date: 06/23/2021 SLP Individual Time: [id number] SLP Individual Time Calculation (min): 55 min  Short Term Goals: Week 4: SLP Short Term Goal 1 (Week 4): ST=LTG due to ELOS  Skilled Therapeutic Interventions: Skilled treatment session focused on cognitive goals. Upon arrival, patient was awake while upright in the wheelchair. Patient independently recalled it was her birthday yesterday, therefore, was able to recall today's date without assistance. Min visual and verbal cues were needed for use of external aids for orientation to place and situation. Patient organized a large calendar with Min verbal cues for problem solving and sustained attention as patient was intermittently distracted by anterior spillage of salvia from oral cavity. SLP educated patient on reasoning for anterior spillage and functional tasks due to utilize to help improve strength with demonstration and use of a mirror, but reinforcement for carryover will be needed. Patient reported incontinence and peri care and brief change was provided with total A +2 while patient stood in the stedy. Patient suddenly reported nausea and  immediately began to vomit. RN aware. Patient left upright in wheelchair with alarm on and all needs within reach. Continue with current plan of care.      Pain Pain Assessment Pain Scale: 0-10 Pain Score: 0-No pain  Therapy/Group: Individual Therapy  Karsten Vaughn 06/23/2021, 9:58 AM

## 2021-06-24 NOTE — Progress Notes (Signed)
PROGRESS NOTE   Subjective/Complaints: +fatigue, refusing Lovenox- since ambulating >150 feet will d/c  ROS: +fatigue  Objective:   No results found. No results for input(s): WBC, HGB, HCT, PLT in the last 72 hours.  No results for input(s): NA, K, CL, CO2, GLUCOSE, BUN, CREATININE, CALCIUM in the last 72 hours.  Intake/Output Summary (Last 24 hours) at 06/24/2021 1655 Last data filed at 06/24/2021 1256 Gross per 24 hour  Intake 600 ml  Output --  Net 600 ml        Physical Exam: Vital Signs Blood pressure 105/81, pulse 91, temperature (!) 97.5 F (36.4 C), resp. rate 18, height '5\' 2"'$  (1.575 m), weight 58.7 kg, SpO2 100 %. Gen: no distress, normal appearing HEENT: oral mucosa pink and moist, NCAT Cardio: Reg rate Chest: normal effort, normal rate of breathing GI: Non-distended.  BS +. Skin: Warm and dry.  Intact. Psych: Normal mood.  Normal behavior. Musc: No edema in extremities.  No tenderness in extremities. Neurological: Alert and oriented with cues  Motor: No movement noted on left side No increase in tone noted  Assessment/Plan: 1. Functional deficits which require 3+ hours per day of interdisciplinary therapy in a comprehensive inpatient rehab setting. Physiatrist is providing close team supervision and 24 hour management of active medical problems listed below. Physiatrist and rehab team continue to assess barriers to discharge/monitor patient progress toward functional and medical goals  Care Tool:  Bathing    Body parts bathed by patient: Left arm, Right upper leg, Left upper leg, Abdomen, Chest, Face, Front perineal area, Right lower leg   Body parts bathed by helper: Left lower leg, Buttocks, Right arm     Bathing assist Assist Level: Maximal Assistance - Patient 24 - 49%     Upper Body Dressing/Undressing Upper body dressing   What is the patient wearing?: Pull over shirt    Upper body  assist Assist Level: Maximal Assistance - Patient 25 - 49%    Lower Body Dressing/Undressing Lower body dressing      What is the patient wearing?: Incontinence brief, Pants     Lower body assist Assist for lower body dressing: 2 Helpers     Toileting Toileting    Toileting assist Assist for toileting: 2 Helpers     Transfers Chair/bed transfer  Transfers assist     Chair/bed transfer assist level: Moderate Assistance - Patient 50 - 74% (squat pivot)     Locomotion Ambulation   Ambulation assist   Ambulation activity did not occur: Safety/medical concerns  Assist level: 2 helpers Assistive device: Lite Gait Max distance: 159f   Walk 10 feet activity   Assist  Walk 10 feet activity did not occur: Safety/medical concerns  Assist level: 2 helpers (mod A of 1 and +2 UE support) Assistive device: Hand held assist   Walk 50 feet activity   Assist Walk 50 feet with 2 turns activity did not occur: Safety/medical concerns         Walk 150 feet activity   Assist Walk 150 feet activity did not occur: Safety/medical concerns         Walk 10 feet on uneven surface  activity  Assist Walk 10 feet on uneven surfaces activity did not occur: Safety/medical concerns         Wheelchair     Assist Is the patient using a wheelchair?: Yes Type of Wheelchair: Manual Wheelchair activity did not occur: Safety/medical concerns         Wheelchair 50 feet with 2 turns activity    Assist    Wheelchair 50 feet with 2 turns activity did not occur: Safety/medical concerns       Wheelchair 150 feet activity     Assist  Wheelchair 150 feet activity did not occur: Safety/medical concerns       Blood pressure 105/81, pulse 91, temperature (!) 97.5 F (36.4 C), resp. rate 18, height '5\' 2"'$  (1.575 m), weight 58.7 kg, SpO2 100 %.      Medical Problem List and Plan: 1.   Left-sided hemiplegia with dysarthria/dysphagia secondary to right  pontine infarction as well as multiple small remote bilateral cerebellar lacunar infarcts Continue CIR  Cont PRAFO, WHO nightly 2.  Impaired mobility: Continue Lovenox for DVT prophylaxis             -antiplatelet therapy: Aspirin 81 mg daily and Plavix 75 mg day x1 month then Plavix alone Flexor withdrawal spasms LLE-zanaflex at night but may be causing am fatigue DC 3. Pain Management/chronic back pain: Continue Tylenol as needed  Controlled on 9/3 4. Depression: Continue Zoloft 100 mg daily, Aricept 5 mg nightly             -antipsychotic agents: Seroquel 12.5 mg q evening.  5. Neuropsych: This patient is NOT capable of making decisions on her own behalf. 6. Skin/Wound Care: Routine skin checks 7. Fluids/Electrolytes/Nutrition: Routine in and outs 8. Post stroke dysphagia.   Continue D2 thins  Advance diet as tolerated 9.  Hypertension.  Patient on verapamil 240 mg daily prior to admission as well as HCTZ 25 mg daily.     Vitals:   06/24/21 0424 06/24/21 1412  BP: (!) 137/59 105/81  Pulse: 67 91  Resp: 19 18  Temp: 97.7 F (36.5 C) (!) 97.5 F (36.4 C)  SpO2: 99% 100%   Relatively soft on 9/3-off of antihypertensives at present 10.  Hyperlipidemia.  Lipitor 11.  Hypothyroidism.  TSH 2.093 .Continue Synthroid 12.  Constipation.  MiraLAX scheduled daily.  Senokot S2 tablets nightly  13. Hypokalemia: Supplementation started   Potassium 3.7 on 8/31, labs ordered for Monday 14. Crackles and coughing with food intake: placed nursing order for must be up with meals, resolved  15.  Hypoalbuminemia  Supplement initiated on 8/13 16.  Cognitive deficits post stroke - has evidence of small vessel disease on imaging as well as the new pontine infarct   8/29- con't memory aids on wall.  17. R upper tooth pain-  Resolved 18. Sedation with agitation  8/26- will stop melatonin- if this doesn't help, might benefit from amantadine.    Amantadine 100 daily increase to twice daily on 8/31.    Moved up seroquel to 2000  LOS: 23 days A FACE TO [place] 06/24/2021, 4:55 PM

## 2021-06-24 NOTE — Progress Notes (Signed)
Pt refused lovenox injection x 3. Nurse explained benefits and importance. " I been up all day I don't need it"

## 2021-06-24 NOTE — Progress Notes (Signed)
Physical Therapy Session Note  Patient Details  Name: Vickie Mckee MRN: [id number] Date of Birth: 12/22/1930  Today's Date: 06/24/2021 PT Individual Time: 1305-1407 PT Individual Time Calculation (min): 62 min   Short Term Goals: Week 3:  PT Short Term Goal 1 (Week 3): Pt will perform supine<>sit with mod assist PT Short Term Goal 2 (Week 3): Pt will perform sit<>stands with mod assist of 1 PT Short Term Goal 3 (Week 3): Pt will perform bed<>chair transfers with mod assist of 1 PT Short Term Goal 4 (Week 3): Pt will ambulate at least 58f using LRAD with max assist of 1 (+2 w/c follow for safety if needed)  Skilled Therapeutic Interventions/Progress Updates:    Pt received sitting in w/c with her 2 daughters present and pt agreeable to therapy session.  Transported to/from gym in w/c for time management and energy conservation.  Donned L UE Giv Mohr sling. Donned L LE ankle DF assist ACE wrap.   Sit>stand w/c>R HHA with min assist for lifting to stand and blocking L knee but heavy mod/max assist for balance due to L posterior lean - cuing for increased and sustained anterior trunk lean along with increased L LE hip/knee extension to achieve midline.  Gait training [id number] with +2 mod/max assist for balance and L LE gait mechanics - +2 assist providing R HHA and assisting with facilitating R weight shift onto R LE during stance along with cuing/facilitating upright posture - pt able to advance L LE ~75% of the way during swing but inconsistent (only ~25% of the time) and could require up to total assist to advance when fatigued at end of each walk - continues to require mod/max assist to block L knee during stance and facilitate increased hip extension, donned [place] Walkon Reaction GRAFO during 2nd & 3rd gait trials with improved knee extension during stance resulting in increased L stance time and increased R LE step length - repeated cuing throughout gait for increased hip and trunk  extension as well as R weight shift as pt gradually progresses to a squat position with strong L lean when fatigued.  At end of session pt left seated in w/c in the care of her daughters.   Therapy Documentation Precautions:  Precautions Precautions: Fall Precaution Comments: L hemiparesis Restrictions Weight Bearing Restrictions: No   Pain: Reports L shoulder discomfort, donned GivMohr sling with improvement.    Therapy/Group: Individual Therapy  CarTawana ScalePT, DPT, NCS, CSRS 06/24/2021, 12:19 PM

## 2021-06-24 NOTE — Progress Notes (Signed)
Speech Language Pathology Daily Session Note  Patient Details  Name: Vickie Mckee MRN: [id number] Date of Birth: 07/30/1931  Today's Date: 06/24/2021 SLP Individual Time: 1435-1540 SLP Individual Time Calculation (min): 65 min  Short Term Goals: Week 4: SLP Short Term Goal 1 (Week 4): ST=LTG due to ELOS  Skilled Therapeutic Interventions: Patient agreeable to skilled ST intervention with focus on swallowing and cognitive goals. Patient was awake and upright in wheelchair upon arrival. She required min A verbal and visual cues for use of external orientation aids for day of week, month, and place. Pt oriented to situation. Facilitated basic problem solving by determining words with several letters missing given category cue with sup A verbal cues. Patient was highly motivated by task thus sustained attention appeared improved. Pt required min re-direction secondary to internal distractions including frequent comments on anterior labial spillage of secretions and sleepiness. Facilitated Dys 3 PO trials where pt exhibited effective and timely mastication, AP transit, and oral clearance without overt s/x of aspiration. Patient exhibited delayed cough (~2 minutes) however unclear if related to PO intake. Recommend upcoming trial tray with Dys 3 textures. Patient was transferred back to bed with steady X2 assist with alarm activated and immediate needs within reach at end of session. Continue per current plan of care.      Pain Pain Assessment Pain Scale: 0-10 Pain Score: 0-No pain  Therapy/Group: Individual Therapy  Patty Sermons 06/24/2021, 3:48 PM

## 2021-06-25 NOTE — Progress Notes (Signed)
PROGRESS NOTE   Subjective/Complaints: Discussed that we can discontinue Lovenox since she is ambulating >150 feet and she is happy about this  ROS: +fatigue, +left sided weakness  Objective:   No results found. No results for input(s): WBC, HGB, HCT, PLT in the last 72 hours.  No results for input(s): NA, K, CL, CO2, GLUCOSE, BUN, CREATININE, CALCIUM in the last 72 hours.  Intake/Output Summary (Last 24 hours) at 06/25/2021 1056 Last data filed at 06/25/2021 0735 Gross per 24 hour  Intake 600 ml  Output --  Net 600 ml        Physical Exam: Vital Signs Blood pressure (!) 146/59, pulse 64, temperature (!) 97.5 F (36.4 C), resp. rate 17, height '5\' 2"'$  (1.575 m), weight 58.7 kg, SpO2 97 %. Gen: no distress, normal appearing HEENT: oral mucosa pink and moist, NCAT Cardio: Reg rate Chest: normal effort, normal rate of breathing Abd: soft, non-distended Ext: no edema Psych: Normal mood.  Normal behavior. Musc: No edema in extremities.  No tenderness in extremities. Neurological: Alert and oriented with cues  Motor: No movement noted on left side No increase in tone noted  Assessment/Plan: 1. Functional deficits which require 3+ hours per day of interdisciplinary therapy in a comprehensive inpatient rehab setting. Physiatrist is providing close team supervision and 24 hour management of active medical problems listed below. Physiatrist and rehab team continue to assess barriers to discharge/monitor patient progress toward functional and medical goals  Care Tool:  Bathing    Body parts bathed by patient: Left arm, Right upper leg, Left upper leg, Abdomen, Chest, Face, Front perineal area, Right lower leg   Body parts bathed by helper: Left lower leg, Buttocks, Right arm     Bathing assist Assist Level: Maximal Assistance - Patient 24 - 49%     Upper Body Dressing/Undressing Upper body dressing   What is the  patient wearing?: Pull over shirt    Upper body assist Assist Level: Maximal Assistance - Patient 25 - 49%    Lower Body Dressing/Undressing Lower body dressing      What is the patient wearing?: Incontinence brief, Pants     Lower body assist Assist for lower body dressing: 2 Helpers     Toileting Toileting    Toileting assist Assist for toileting: 2 Helpers     Transfers Chair/bed transfer  Transfers assist     Chair/bed transfer assist level: Moderate Assistance - Patient 50 - 74% (squat pivot)     Locomotion Ambulation   Ambulation assist   Ambulation activity did not occur: Safety/medical concerns  Assist level: 2 helpers (+2 mod/max A) Assistive device: Hand held assist Max distance: 4f   Walk 10 feet activity   Assist  Walk 10 feet activity did not occur: Safety/medical concerns  Assist level: 2 helpers (+2 mod/max A) Assistive device: Hand held assist   Walk 50 feet activity   Assist Walk 50 feet with 2 turns activity did not occur: Safety/medical concerns         Walk 150 feet activity   Assist Walk 150 feet activity did not occur: Safety/medical concerns         Walk 10  feet on uneven surface  activity   Assist Walk 10 feet on uneven surfaces activity did not occur: Safety/medical concerns         Wheelchair     Assist Is the patient using a wheelchair?: Yes Type of Wheelchair: Manual Wheelchair activity did not occur: Safety/medical concerns         Wheelchair 50 feet with 2 turns activity    Assist    Wheelchair 50 feet with 2 turns activity did not occur: Safety/medical concerns       Wheelchair 150 feet activity     Assist  Wheelchair 150 feet activity did not occur: Safety/medical concerns       Blood pressure (!) 146/59, pulse 64, temperature (!) 97.5 F (36.4 C), resp. rate 17, height '5\' 2"'$  (1.575 m), weight 58.7 kg, SpO2 97 %.      Medical Problem List and Plan: 1.   Left-sided  hemiplegia with dysarthria/dysphagia secondary to right pontine infarction as well as multiple small remote bilateral cerebellar lacunar infarcts Continue CIR  Cont PRAFO, WHO nightly 2.  Impaired mobility: discontinue Lovenox for DVT prophylaxis since ambulating >150 feet             -antiplatelet therapy: Aspirin 81 mg daily and Plavix 75 mg day x1 month then Plavix alone Flexor withdrawal spasms LLE-zanaflex at night but may be causing am fatigue DC 3. Pain Management/chronic back pain: Continue Tylenol as needed  Controlled on 9/4 4. Depression: Continue Zoloft 100 mg daily, Aricept 5 mg nightly             -antipsychotic agents: Seroquel 12.5 mg q evening.  5. Neuropsych: This patient is NOT capable of making decisions on her own behalf. 6. Skin/Wound Care: Routine skin checks 7. Fluids/Electrolytes/Nutrition: Routine in and outs 8. Post stroke dysphagia.   Continue D2 thins  Advance diet as tolerated 9.  Hypertension.  Patient on verapamil 240 mg daily prior to admission as well as HCTZ 25 mg daily.     Vitals:   06/24/21 2025 06/25/21 0329  BP: (!) 114/59 (!) 146/59  Pulse: 79 64  Resp: 17 17  Temp: 97.6 F (36.4 C) (!) 97.5 F (36.4 C)  SpO2: 96% 97%   Relatively soft on 9/3-off of antihypertensives at present 10.  Hyperlipidemia.  Lipitor 11.  Hypothyroidism.  TSH 2.093 .Continue Synthroid 12.  Constipation.  MiraLAX scheduled daily.  Senokot S2 tablets nightly  13. Hypokalemia: Supplementation started   Potassium 3.7 on 8/31, labs ordered for Monday 14. Crackles and coughing with food intake: placed nursing order for must be up with meals, resolved  15.  Hypoalbuminemia  Supplement initiated on 8/13 16.  Cognitive deficits post stroke - has evidence of small vessel disease on imaging as well as the new pontine infarct   8/29- con't memory aids on wall.  17. R upper tooth pain-  Resolved 18. Sedation with agitation  8/26- will stop melatonin- if this doesn't help,  might benefit from amantadine.    Amantadine 100 daily increase to twice daily on 8/31.   Moved up seroquel to 2000  LOS: 24 days A FACE TO [place] 06/25/2021, 10:56 AM

## 2021-06-26 LAB — CBC WITH DIFFERENTIAL/PLATELET
Abs Immature Granulocytes: 0.04 10*3/uL (ref 0.00–0.07)
Basophils Absolute: 0 10*3/uL (ref 0.0–0.1)
Basophils Relative: 1 %
Eosinophils Absolute: 0.6 10*3/uL — ABNORMAL HIGH (ref 0.0–0.5)
Eosinophils Relative: 7 %
HCT: 38.2 % (ref 36.0–46.0)
Hemoglobin: 12.7 g/dL (ref 12.0–15.0)
Immature Granulocytes: 1 %
Lymphocytes Relative: 33 %
Lymphs Abs: 2.6 10*3/uL (ref 0.7–4.0)
MCH: 30.2 pg (ref 26.0–34.0)
MCHC: 33.2 g/dL (ref 30.0–36.0)
MCV: 90.7 fL (ref 80.0–100.0)
Monocytes Absolute: 0.9 10*3/uL (ref 0.1–1.0)
Monocytes Relative: 11 %
Neutro Abs: 3.8 10*3/uL (ref 1.7–7.7)
Neutrophils Relative %: 47 %
Platelets: 222 10*3/uL (ref 150–400)
RBC: 4.21 MIL/uL (ref 3.87–5.11)
RDW: 14.9 % (ref 11.5–15.5)
WBC: 7.9 10*3/uL (ref 4.0–10.5)
nRBC: 0 % (ref 0.0–0.2)

## 2021-06-26 LAB — BASIC METABOLIC PANEL
Anion gap: 11 (ref 5–15)
BUN: 29 mg/dL — ABNORMAL HIGH (ref 8–23)
CO2: 27 mmol/L (ref 22–32)
Calcium: 9.4 mg/dL (ref 8.9–10.3)
Chloride: 101 mmol/L (ref 98–111)
Creatinine, Ser: 1.07 mg/dL — ABNORMAL HIGH (ref 0.44–1.00)
GFR, Estimated: 50 mL/min — ABNORMAL LOW (ref >60)
Glucose, Bld: 112 mg/dL — ABNORMAL HIGH (ref 70–99)
Potassium: 3.9 mmol/L (ref 3.5–5.1)
Sodium: 139 mmol/L (ref 135–145)

## 2021-06-26 NOTE — Progress Notes (Signed)
Speech Language Pathology Daily Session Note  Patient Details  Name: Vickie Mckee MRN: [id number] Date of Birth: April 12, 1931  Today's Date: 06/26/2021 SLP Individual Time: 1100-1215 SLP Individual Time Calculation (min): 75 min  Short Term Goals: Week 4: SLP Short Term Goal 1 (Week 4): ST=LTG due to ELOS  Skilled Therapeutic Interventions: Patient agreeable to skilled ST intervention with focus on cognitive and swallowing goals. Upon arrival, patient expressed she was upset because she was alone and wanted to go back to her room, despite being in her room at this time. SLP provided emotional support and facilitated orientation techniques by drawing attention to external aids and personal belongings. This appeared beneficial although pt was internally distracted by this experience throughout session and required mod A verbal cues to retain this information during session. SLP facilitated session by providing sup-to-min A for mildly complex problem solving and working memory of task instructions. She required mod A verbal re-directions needed due to internal distractions as evidenced by perseverating on anterior spillage of saliva, and thinking she was going home today. ST attempted to facilitate Dys 3 meal trial by requesting for meal tray to arrive around time of session (11am). Tray did not arrive until 12:15 and was Dys 2 textures, not Dys 3 as requested. Plan to attempt meal trial tomorrow. Educated daughter on orientation strategies as patient and family transition to SNF on Friday. Patient was left in wheelchair with alarm activated and immediate needs within reach at end of session. Continue per current plan of care.      Pain Pain Assessment Pain Scale: 0-10 Pain Score: 0-No pain  Therapy/Group: Individual Therapy  Patty Sermons 06/26/2021, 12:26 PM

## 2021-06-26 NOTE — Progress Notes (Signed)
Occupational Therapy Session Note  Patient Details  Name: Vickie Mckee MRN: [id number] Date of Birth: 06/30/1931  Today's Date: 06/26/2021 OT Individual Time: [id number] OT Individual Time Calculation (min): 54 min    Short Term Goals: Week 4:  OT Short Term Goal 1 (Week 4): Continue working on established LTGs at [employer] to [employer].  Skilled Therapeutic Interventions/Progress Updates:    Pt on bedpan to start session.  Min assist for rolling to the left to remove bed pan with total assist for cleaning her buttocks.  She then transitioned to supine where she was able to complete washing the front peri with setup.  Therapist completed closing brief with pt transitioning to sitting at max assist level for work on bathing and dressing.  She was able to complete UB bathing with supervision as well as dressing at mod assist level. She was able to wash her lower legs and feet with mod assist, with assist to cross the LLE over the right knee with mod assist.  She was able to complete donning pants sit to stand with total +2 (pt 35%).  Total assist was then needed for donning TEDS with mod assist for tying shoes.  She was able to complete mod assist transfer to the wheelchair squat pivot.  She then completed oral hygiene with setup as well for combing her hair.  Finished with pt in the wheelchair with the call button and phone in reach and safety belt in place.    Therapy Documentation Precautions:  Precautions Precautions: Fall Precaution Comments: L hemiparesis Restrictions Weight Bearing Restrictions: No  Pain: Pain Assessment Pain Scale: Faces Pain Score: 0-No pain ADL: See Care Tool Section for some details of mobility and selfcare   Therapy/Group: Individual Therapy  Tajae Maiolo OTR/L 06/26/2021, 10:46 AM

## 2021-06-26 NOTE — Progress Notes (Signed)
PROGRESS NOTE   Subjective/Complaints: Awake but "tired" , no pains or breathing issues  Labs reviewed  ROS: +fatigue, +left sided weakness  Objective:   No results found. Recent Labs    06/26/21 0511  WBC 7.9  HGB 12.7  HCT 38.2  PLT 222    Recent Labs    06/26/21 0511  NA 139  K 3.9  CL 101  CO2 27  GLUCOSE 112*  BUN 29*  CREATININE 1.07*  CALCIUM 9.4    Intake/Output Summary (Last 24 hours) at 06/26/2021 0755 Last data filed at 06/25/2021 1803 Gross per 24 hour  Intake 420 ml  Output --  Net 420 ml         Physical Exam: Vital Signs Blood pressure 125/72, pulse 78, temperature 97.6 F (36.4 C), temperature source Oral, resp. rate 18, height '5\' 2"'$  (1.575 m), weight 58.7 kg, SpO2 97 %.  General: No acute distress Mood and affect are appropriate Heart: Regular rate and rhythm no rubs murmurs or extra sounds Lungs: Clear to auscultation, breathing unlabored, no rales or wheezes Abdomen: Positive bowel sounds, soft nontender to palpation, nondistended Extremities: No clubbing, cyanosis, or edema Skin: No evidence of breakdown, no evidence of rash   Motor: No movement noted on left side No increase in tone noted  Assessment/Plan: 1. Functional deficits which require 3+ hours per day of interdisciplinary therapy in a comprehensive inpatient rehab setting. Physiatrist is providing close team supervision and 24 hour management of active medical problems listed below. Physiatrist and rehab team continue to assess barriers to discharge/monitor patient progress toward functional and medical goals  Care Tool:  Bathing    Body parts bathed by patient: Left arm, Right upper leg, Left upper leg, Abdomen, Chest, Face, Front perineal area, Right lower leg   Body parts bathed by helper: Left lower leg, Buttocks, Right arm     Bathing assist Assist Level: Maximal Assistance - Patient 24 - 49%     Upper  Body Dressing/Undressing Upper body dressing   What is the patient wearing?: Pull over shirt    Upper body assist Assist Level: Maximal Assistance - Patient 25 - 49%    Lower Body Dressing/Undressing Lower body dressing      What is the patient wearing?: Incontinence brief, Pants     Lower body assist Assist for lower body dressing: 2 Helpers     Toileting Toileting    Toileting assist Assist for toileting: 2 Helpers     Transfers Chair/bed transfer  Transfers assist     Chair/bed transfer assist level: Moderate Assistance - Patient 50 - 74% (squat pivot)     Locomotion Ambulation   Ambulation assist   Ambulation activity did not occur: Safety/medical concerns  Assist level: 2 helpers (+2 mod/max A) Assistive device: Hand held assist Max distance: 70f   Walk 10 feet activity   Assist  Walk 10 feet activity did not occur: Safety/medical concerns  Assist level: 2 helpers (+2 mod/max A) Assistive device: Hand held assist   Walk 50 feet activity   Assist Walk 50 feet with 2 turns activity did not occur: Safety/medical concerns  Walk 150 feet activity   Assist Walk 150 feet activity did not occur: Safety/medical concerns         Walk 10 feet on uneven surface  activity   Assist Walk 10 feet on uneven surfaces activity did not occur: Safety/medical concerns         Wheelchair     Assist Is the patient using a wheelchair?: Yes Type of Wheelchair: Manual Wheelchair activity did not occur: Safety/medical concerns         Wheelchair 50 feet with 2 turns activity    Assist    Wheelchair 50 feet with 2 turns activity did not occur: Safety/medical concerns       Wheelchair 150 feet activity     Assist  Wheelchair 150 feet activity did not occur: Safety/medical concerns       Blood pressure 125/72, pulse 78, temperature 97.6 F (36.4 C), temperature source Oral, resp. rate 18, height '5\' 2"'$  (1.575 m), weight  58.7 kg, SpO2 97 %.      Medical Problem List and Plan: 1.   Left-sided hemiplegia with dysarthria/dysphagia secondary to right pontine infarction as well as multiple small remote bilateral cerebellar lacunar infarcts Continue CIR PT, OT Cont PRAFO, WHO nightly 2.  Impaired mobility: discontinue Lovenox for DVT prophylaxis since ambulating >150 feet             -antiplatelet therapy: Aspirin 81 mg daily and Plavix 75 mg day x1 month then Plavix alone Flexor withdrawal spasms LLE-zanaflex at night but may be causing am fatigue DC 3. Pain Management/chronic back pain: Continue Tylenol as needed  Controlled on 9/5 4. Depression: Continue Zoloft 100 mg daily, Aricept 5 mg nightly             -antipsychotic agents: Seroquel 12.5 mg q evening.  5. Neuropsych: This patient is NOT capable of making decisions on her own behalf. 6. Skin/Wound Care: Routine skin checks 7. Fluids/Electrolytes/Nutrition: Routine in and outs 8. Post stroke dysphagia.   Continue D2 thins  Advance diet as tolerated 9.  Hypertension.  Patient on verapamil 240 mg daily prior to admission as well as HCTZ 25 mg daily.     Vitals:   06/25/21 1938 06/26/21 0359  BP: 116/61 125/72  Pulse: 85 78  Resp: 18 18  Temp: 98.1 F (36.7 C) 97.6 F (36.4 C)  SpO2: 95% 97%   Normotensive 9/5 10.  Hyperlipidemia.  Lipitor 11.  Hypothyroidism.  TSH 2.093 .Continue Synthroid 12.  Constipation.  MiraLAX scheduled daily.  Senokot S2 tablets nightly  13. Hypokalemia: Supplementation started   Potassium 3.7 on 8/31, labs stable 14. Crackles and coughing with food intake: placed nursing order for must be up with meals, resolved  15.  Hypoalbuminemia  Supplement initiated on 8/13 16.  Cognitive deficits post stroke - has evidence of small vessel disease on imaging as well as the new pontine infarct   8/29- con't memory aids on wall.  17. R upper tooth pain-  Resolved 18. Sedation with agitation  8/26- will stop melatonin- if this  doesn't help, might benefit from amantadine.    Amantadine 100 daily increase to twice daily on 8/31.   Moved up seroquel to 2000- per pt sleeping well   LOS: 25 days A FACE TO [place] E Savva Beamer 06/26/2021, 7:55 AM

## 2021-06-26 NOTE — Plan of Care (Signed)
  Problem: RH Car Transfers Goal: LTG Patient will perform car transfers with assist (PT) Description: LTG: Patient will perform car transfers with assistance (PT). Outcome: Not Applicable Flowsheets (Taken 06/26/2021 1859) LTG: Pt will perform car transfers with assist:: (N/A as pt will be going to SNF) -- Note: N/A as pt will be going to SNF   Problem: RH Stairs Goal: LTG Patient will ambulate up and down stairs w/assist (PT) Description: LTG: Patient will ambulate up and down # of stairs with assistance (PT) Outcome: Not Applicable Flowsheets (Taken 06/26/2021 1859) LTG: Pt will ambulate up/down stairs assist needed:: (D/Cing to SNF with no stair navigation needed) -- Note: D/Cing to SNF with no stair navigation needed   Problem: RH Ambulation Goal: LTG Patient will ambulate in home environment (PT) Description: LTG: Patient will ambulate in home environment, # of feet with assistance (PT). Outcome: Not Applicable Flowsheets (Taken 06/26/2021 1859) LTG: Pt will ambulate in home environ  assist needed:: (pt will not be a functional household ambulator) -- Note: pt will not be a functional household ambulator

## 2021-06-26 NOTE — Progress Notes (Signed)
Physical Therapy Weekly Progress Note  Patient Details  Name: Vickie Mckee MRN: [id number] Date of Birth: [birth year]  Beginning of progress report period: June 20, 2021 End of progress report period: June 26, 2021  Today's Date: 06/26/2021 PT Individual Time: 1840-3754 PT Individual Time Calculation (min): 68 min   Patient has met 1 of 4 short term goals.  Ms. Sockwell continues to make slow, steady progress with therapy demonstrating increasing independence with functional mobility. She continues to be limited by impaired activity tolerance and impaired memory/recall of her progress requiring use of motor memory for carryover of learning. She performs supine<>sit with max assist due to poor trunk control, sit<>stands using R UE support with mod/max assist (demos improved trunk/hip extension for upright posture), squat pivot transfers with mod assist, and gait training with R HHA and +2 mod assist for balance and L LE gait mechanics. She demos improved ability to advance L LE during swing ~50-75% of the way for short distance and stabilize L knee during stance with only min/mod assist. She will benefit from continued CIR level therapies to further progress her independence with functional mobility prior to D/C to SNF on anticipated date of 9/9 where she will continue therapy services.    Patient continues to demonstrate the following deficits muscle weakness and muscle paralysis, decreased cardiorespiratoy endurance, impaired timing and sequencing, abnormal tone, unbalanced muscle activation, and decreased motor planning, decreased midline orientation and decreased attention to left, decreased initiation, decreased attention, decreased awareness, decreased problem solving, decreased safety awareness, decreased memory, and delayed processing, and decreased sitting balance, decreased standing balance, decreased postural control, hemiplegia, and decreased balance strategies and therefore will  continue to benefit from skilled PT intervention to increase functional independence with mobility.  Patient not progressing toward long term goals.  See goal revision..  Continue plan of care.  PT Short Term Goals Week 3:  PT Short Term Goal 1 (Week 3): Pt will perform supine<>sit with mod assist PT Short Term Goal 1 - Progress (Week 3): Progressing toward goal PT Short Term Goal 2 (Week 3): Pt will perform sit<>stands with mod assist of 1 PT Short Term Goal 2 - Progress (Week 3): Progressing toward goal PT Short Term Goal 3 (Week 3): Pt will perform bed<>chair transfers with mod assist of 1 PT Short Term Goal 3 - Progress (Week 3): Met PT Short Term Goal 4 (Week 3): Pt will ambulate at least 76f using LRAD with max assist of 1 (+2 w/c follow for safety if needed) PT Short Term Goal 4 - Progress (Week 3): Progressing toward goal Week 4:  PT Short Term Goal 1 (Week 4): = to LTGs based on ELOS  Skilled Therapeutic Interventions/Progress Updates:  Ambulation/gait training;Balance/vestibular training;Cognitive remediation/compensation;Community reintegration;Discharge planning;DME/adaptive equipment instruction;Functional mobility training;Disease management/prevention;Neuromuscular re-education;Pain management;Patient/family education;Psychosocial support;Splinting/orthotics;Stair training;Therapeutic Activities;Therapeutic Exercise;UE/LE Strength taining/ROM;UE/LE Coordination activities;Visual/perceptual remediation/compensation;Wheelchair propulsion/positioning;Skin care/wound management;Functional electrical stimulation  Pt received sitting in w/c with her daughter, MRosann Auerbach present and pt agreeable to therapy session. Pt continues to have fluctuating but overall appears to have worsened disorientation with pt's daughter also reporting she feels it has been worse the past 2 days - will discuss with team.  Transported to/from gym in w/c for time management and energy conservation.  Donned L LE  [place] Walk[place] Reaction GRAFO. Sit>stands w/c>R UE HHA from +2 assist with heavy mod assist for lifting to stand and cuing for increased trunk/hip extension upon rising after cuing for increased anterior trunk lean - therapist blocking L knee and  facilitating L hip/knee extension. Gait training 94f, 578f 7178fsing RHHA with +2 mod assist for balance and L LE gait mechanics - pt starts with improved trunk/hip extension for upright posture but progressively has worsening flexion and L lean - able to advance L LE ~75-100% of the way during swing at least 25% the time, but requires facilitation for proper placement due to excess adduction - requires min/mod assist to facilitate L hip/knee extension during stance - facilitating for weight shifting onto stance limb throughout.  R LE w/c propulsion ~[id number] to room with min assist - attempted to educated pt on how to perform R UE and R LE hemi-propulsion technique; however, pt states "I can't do 2 things at one time" and demos improved forward movement of chair using LE only.   Pt reports need to use bathroom. Sit>stand w/c>stedy with mod assist for lifting to stand due to pt reporting fatigue. Stedy transfer in/out of bathroom. Standing in stedy with mod assist of 1 and +2 total assist LB clothing management and peri-care - continent of bladder. At end of session pt left seated in w/c with needs in reach, L UE supported on lap table with crossword puzzle set-up, seat belt alarm on, and therapist provided pt with note stating upcoming dinner time to improve pt's orientation.    Therapy Documentation Precautions:  Precautions Precautions: Fall Precaution Comments: L hemiparesis Restrictions Weight Bearing Restrictions: No  Pain:   Reports her L arm and leg are "dead" due to paresis - educated pt on effects of CVA throughout session. No reports of pain.  Therapy/Group: Individual Therapy  CarlTawana ScaleT, DPT, NCS, CSRS 06/26/2021, 6:24 PM

## 2021-06-27 LAB — URINALYSIS, ROUTINE W REFLEX MICROSCOPIC
Bilirubin Urine: NEGATIVE
Glucose, UA: NEGATIVE mg/dL
Hgb urine dipstick: NEGATIVE
Ketones, ur: NEGATIVE mg/dL
Leukocytes,Ua: NEGATIVE
Nitrite: NEGATIVE
Protein, ur: NEGATIVE mg/dL
Specific Gravity, Urine: 1.02 (ref 1.005–1.030)
pH: 6 (ref 5.0–8.0)

## 2021-06-27 MED ORDER — FAMOTIDINE 20 MG PO TABS
20.0000 mg | ORAL_TABLET | Freq: Every day | ORAL | Status: DC
  Administered 2021-06-28 – 2021-06-30 (×3): 20 mg via ORAL
  Filled 2021-06-27 (×3): qty 1

## 2021-06-27 NOTE — NC FL2 (Signed)
[place] LEVEL OF CARE SCREENING TOOL     IDENTIFICATION  Patient Name: Vickie Mckee Birthdate: 1930-12-21 Sex: female Admission Date (Current Location): 06/01/2021  [facility] and [place] Number:  [employer] and Address:  [facility], [place] [address] [place], [place] [address]      Provider Number:    Attending Physician Name and Address:  Charlett Blake, MD  Relative Name and Phone Number:  Butch Penny ([phone]    Current Level of Care: Hospital Recommended Level of Care: [place] Prior Approval Number:    Date Approved/Denied:   PASRR Number:    Discharge Plan: SNF    Current Diagnoses: Patient Active Problem List   Diagnosis Date Noted   Hypoalbuminemia due to protein-calorie malnutrition [facility])    Hypokalemia    Dysphagia, post-stroke    Right pontine cerebrovascular accident ([place]) 06/01/2021   Left hemiplegia ([place]) 06/01/2021   Ischemic stroke/Acute right pontine infarct.  05/29/2021   Essential hypertension    Hypothyroidism    HOCM (hypertrophic obstructive cardiomyopathy) ([place]) 07/23/2014   Weakness of both legs 07/23/2014   Heart murmur     Orientation RESPIRATION BLADDER Height & Weight     Self, Place  Normal Incontinent Weight: 129 lb 6.6 oz (58.7 kg) Height:  '5\' 2"'$  (157.5 cm)  BEHAVIORAL SYMPTOMS/MOOD NEUROLOGICAL BOWEL NUTRITION STATUS      Incontinent Diet (Dys 1 textures, honey thick - sup A)  AMBULATORY STATUS COMMUNICATION OF NEEDS Skin   Limited Assist Verbally Normal                       Personal Care Assistance Level of Assistance  Bathing, Feeding, Dressing Bathing Assistance: Limited assistance Feeding assistance: Limited assistance Dressing Assistance: Limited assistance     Functional Limitations Info             SPECIAL CARE FACTORS FREQUENCY  PT (By licensed PT), OT (By licensed OT), Speech therapy     PT Frequency: 5x a week OT  Frequency: 5x a week     Speech Therapy Frequency: 5x a week      Contractures      Additional Factors Info  Code Status Code Status Info: DNR             Current Medications (06/27/2021):  This is the current hospital active medication list Current Facility-Administered Medications  Medication Dose Route Frequency Provider Last Rate Last Admin   (feeding supplement) PROSource Plus liquid 30 mL  30 mL Oral BID BM Jamse Arn, MD   30 mL at 06/27/21 0829   acetaminophen (TYLENOL) tablet 650 mg  650 mg Oral Q4H PRN Cathlyn Parsons, PA-C   650 mg at 06/22/21 2025   Or   acetaminophen (TYLENOL) 160 MG/5ML solution 650 mg  650 mg Per Tube Q4H PRN Angiulli, Lavon Paganini, PA-C       Or   acetaminophen (TYLENOL) suppository 650 mg  650 mg Rectal Q4H PRN Angiulli, Lavon Paganini, PA-C       amantadine (SYMMETREL) 50 MG/5ML solution 100 mg  100 mg Oral BID WC Jamse Arn, MD   100 mg at 06/27/21 [id number]   ascorbic acid (VITAMIN C) tablet 500 mg  500 mg Oral Daily Cathlyn Parsons, PA-C   500 mg at 06/27/21 0830   aspirin EC tablet 81 mg  81 mg Oral Q breakfast Cathlyn Parsons, PA-C   81  mg at 06/27/21 0830   atorvastatin (LIPITOR) tablet 40 mg  40 mg Oral Daily Cathlyn Parsons, PA-C   40 mg at 06/27/21 0830   clopidogrel (PLAVIX) tablet 75 mg  75 mg Oral Daily Cathlyn Parsons, PA-C   75 mg at 06/27/21 0830   donepezil (ARICEPT) tablet 5 mg  5 mg Oral QHS AngiulliLavon Paganini, PA-C   5 mg at 06/26/21 2053   famotidine (PEPCID) tablet 40 mg  40 mg Oral Daily Cathlyn Parsons, PA-C   40 mg at 06/27/21 [id number]   feeding supplement (ENSURE ENLIVE / ENSURE PLUS) liquid 237 mL  237 mL Oral BID BM Charlett Blake, MD   237 mL at 06/27/21 0830   levothyroxine (SYNTHROID) tablet 75 mcg  75 mcg Oral QAC breakfast Cathlyn Parsons, PA-C   75 mcg at 06/27/21 0518   polyethylene glycol (MIRALAX / GLYCOLAX) packet 17 g  17 g Oral Daily Cathlyn Parsons, PA-C   17 g at 06/27/21 [id number]    potassium chloride 20 MEQ/15ML (10%) solution 10 mEq  10 mEq Oral Daily Charlett Blake, MD   10 mEq at 06/27/21 [id number]   QUEtiapine (SEROQUEL) tablet 12.5 mg  12.5 mg Oral QPC supper Cathlyn Parsons, PA-C   12.5 mg at 06/26/21 1727   senna-docusate (Senokot-S) tablet 2 tablet  2 tablet Oral QHS Cathlyn Parsons, PA-C   2 tablet at 06/26/21 2053   sertraline (ZOLOFT) tablet 100 mg  100 mg Oral Daily Cathlyn Parsons, PA-C   100 mg at 06/27/21 0830   zinc sulfate capsule 220 mg  220 mg Oral Daily AngiulliLavon Paganini, PA-C   220 mg at 06/27/21 0830     Discharge Medications: Please see discharge summary for a list of discharge medications.  Relevant Imaging Results:  Relevant Lab Results:   Additional Information    [name]

## 2021-06-27 NOTE — Progress Notes (Signed)
Speech Language Pathology Daily Session Note  Patient Details  Name: Vickie Mckee MRN: [id number] Date of Birth: 07-12-1931  Today's Date: 06/27/2021 SLP Individual Time: [id number] SLP Individual Time Calculation (min): 60 min  Short Term Goals: Week 4: SLP Short Term Goal 1 (Week 4): ST=LTG due to ELOS  Skilled Therapeutic Interventions: Patient agreeable to skilled ST intervention with focus on swallowing, cognition, and family education with daughters. Patient was alert and in good spirits today despite report of minimal sleep last night. SLP facilitated Dys 3 meal trial during noon meal. Patient consumed with effective mastication, mild anterior labial spillage of secretions and thin liquids, and mild oral and buccal residue which was effectively cleared with lingual sweep and liquid rinse with sup A verbal cues. Patient displayed improved awareness of labial spillage and independently cleared with washcloth throughout session. Patient exhibited only one instance of coughing which appeared attributed to talking with bolus in mouth. No further overt s/sx of aspiration noted. Patient required intermittent reminders to minimize talking while eating. Patient and daughters were educated on diet advancement and reinforcement of safe swallow precautions and strategies. Recommend diet advancement of Dys 3 textures and thin liquids. Continue meds whole in puree. Patient required mod-to-max verbal cues for recall, and consistent reminders that she is leaving Friday and not today. Sup a verbal cues to use external aids to orient to time and place. Patient was left in wheelchair with alarm activated and immediate needs within reach at end of session. Continue per current plan of care.      Pain Pain Assessment Pain Scale: 0-10 Pain Score: 0-No pain  Therapy/Group: Individual Therapy  Arlesia Kiel T Freddi Forster 06/27/2021, 1:24 PM

## 2021-06-27 NOTE — Progress Notes (Signed)
Physical Therapy Session Note  Patient Details  Name: Vickie Mckee MRN: [id number] Date of Birth: [birth year]  Today's Date: 06/27/2021 PT Individual Time: 0900-0957 PT Individual Time Calculation (min): 57 min   Short Term Goals: Week 4:  PT Short Term Goal 1 (Week 4): = to LTGs based on ELOS  Skilled Therapeutic Interventions/Progress Updates:     Pt supine in bed at start of session - awake and agreeable to therapy - she is quite confused/disoriented, reports she's been talking to her husband and seeing her husband in the hallways. She's oriented to self and year but not situation or place. She's easily redirectable. She was reoriented to all but she was unable to remember. Donned pants and shoes at bed level with +2 totalA. Required maxA for supine<>sit for trunk and BLE management. Required modA for initial sitting balance due to posterior bias and required maxA for forward scooting at EOB although she does initiate.   Assisted to w/c via squat<>pivot with modA, towards her weaker L side. Transported to ortho rehab gym for time and wheeled to standing frame.   Dependant stand in standing frame and worked on functional reaching with RUE to encourage LLE weight shift and upright standing. Also completed mini-squats within standing frame, working on glut activation and posterior chain facilitation. She tolerated standing for 45mn35sec! Without a seated rest break.   Assisted to mat table via squat pivot transfer with modA. While seated edge of mat, worked on trunk activation and core strengthening to address bed mobility goals. Completed lateral leans on stronger R elbow, forward weight shifts, slump sitting to tall sitting, and modified crunches - required facilitation for all activities.   Assisted back to her w/c via squat<>pivot transfer with modA with increased cueing needed for setup and sequencing as she was very distracted from internal distractions and confusion (talking about her  husband, her weakness/deficits, etc).  Wheeled back to her room and reclined in manual w/c for safety. Safety belt alarm on, LUE supported with pillow, all needs in reach at conclusion of session.  Therapy Documentation Precautions:  Precautions Precautions: Fall Precaution Comments: L hemiparesis Restrictions Weight Bearing Restrictions: No General:    Therapy/Group: Individual Therapy  CAlger Simons9/03/2021, 7:38 AM

## 2021-06-27 NOTE — Progress Notes (Signed)
Nutrition Follow-up  DOCUMENTATION CODES:   Not applicable  INTERVENTION:  Continue Ensure Enlive po BID, each supplement provides 350 kcal and 20 grams of protein   Continue 30 ml Prosource plus po BID, each supplement provides 100 kcal and 15 grams of protein.    Encourage adequate PO intake.   NUTRITION DIAGNOSIS:   Increased nutrient needs related to  (therapy) as evidenced by estimated needs; ongoing  GOAL:   Patient will meet greater than or equal to 90% of their needs; met  MONITOR:   PO intake, Supplement acceptance, Skin, Weight trends, Labs, I & O's, Diet advancement  REASON FOR ASSESSMENT:   Malnutrition Screening Tool    ASSESSMENT:   87 year old female with history of hypertension hypothyroidism GERD chronic low back pain, hypertrophic obstructive cardiomyopathy. Presents with acute onset of left-sided weakness and dysarthria as well as left lateral gaze. MRI showed acute right pontine infarction. Multiple small remote bilateral cerebellar lacunar infarcts. Therapy evaluations completed due to patient's left-sided weakness dysarthria/dysphagia was admitted for a comprehensive rehab program.  Meal completion has been 50-100%. Pt is currently on a dysphagia 3 diet with thin liquids. Pt currently has Ensure and Prosource plus ordered and has been consuming them. Per MD, possible plans to discharge to [facility] 9/9.   Labs and medications reviewed.   Diet Order:   Diet Order             DIET DYS 3 Room service appropriate? Yes; Fluid consistency: Thin  Diet effective now                   EDUCATION NEEDS:   Not appropriate for education at this time  Skin:  Skin Assessment: Reviewed RN Assessment  Last BM:  9/5  Height:   Ht Readings from Last 1 Encounters:  06/02/21 '5\' 2"'  (1.575 m)    Weight:   Wt Readings from Last 1 Encounters:  06/22/21 58.7 kg   BMI:  Body mass index is 23.67 kg/m.  Estimated Nutritional Needs:   Kcal:   1550-1700  Protein:  75-85 grams  Fluid:  >/= 1.5 L/day  Corrin Parker, MS, RD, LDN RD pager number/after hours weekend pager number on Amion.

## 2021-06-27 NOTE — Progress Notes (Signed)
Occupational Therapy Session Note  Patient Details  Name: Vickie Mckee MRN: [id number] Date of Birth: Mar 13, 1931  Today's Date: 06/27/2021 OT Individual Time: 1403-1501 OT Individual Time Calculation (min): 58 min    Short Term Goals: Week 4:  OT Short Term Goal 1 (Week 4): Continue working on established LTGs at [employer] to [employer].  Skilled Therapeutic Interventions/Progress Updates:    Pt up in wheelchair to start with family present.  Therapist took her down to the therapy gym where she worked on sit to stand and standing balance while engaged in use of the [employer].  She needed max assist to complete sit to stand and maintain standing balance without any UE support.  Increased pushing to the left is noted with decreased trunk and hip extension.  She needs max facilitation in the LLE to maintain knee extension and complete small weightshift to the right to help decrease pushing.  Completed several intervals of standing while having her use the RUE to complete Rotary Visual Pursuits task as well as visual scanning.  Increased time needed secondary to fear of falling and pt stopping activity to try and use her RUE to help her balance.  Finished session with transfer back to the room via wheelchair with max assist stand pivot transfer to the bed.  She was able to remove her shoes with mod assist and then transition to supine at the same level.  Call button and phone in reach with safety alarm in place.    Therapy Documentation Precautions:  Precautions Precautions: Fall Precaution Comments: L hemiparesis Restrictions Weight Bearing Restrictions: No  Pain: Pain Assessment Pain Scale: Faces Pain Score: 0-No pain ADL: See Care Tool Section for some details of mobility and selfcare  Therapy/Group: Individual Therapy  Jaidy Cottam OTR/L 06/27/2021, 4:02 PM

## 2021-06-27 NOTE — Progress Notes (Signed)
Patient ID: Vickie Mckee, female   DOB: 1931/02/25, 87 y.o.   MRN: [id number]  SW made attempt to follow up with [facility] on pt d/c. Original AD: Vickie Mckee on vacation. Followed up with covering AD:  Vickie Mckee, left a VM. Waiting on follow up call to confirm d/c for 9/9. SW will cont to follow up  Erlene Quan, [place]

## 2021-06-27 NOTE — NC FL2 (Signed)
[place] LEVEL OF CARE SCREENING TOOL     IDENTIFICATION  Patient Name: Vickie Mckee Birthdate: 1931-07-05 Sex: female Admission Date (Current Location): 06/01/2021  [facility] and [place] Number:  [employer] and Address:  [facility] [place]. [facility], [place] [address], [place], [place] [address]      Provider Number:    Attending Physician Name and Address:  Charlett Blake, MD  Relative Name and Phone Number:  Butch Penny ([phone]    Current Level of Care: Hospital Recommended Level of Care: [place] Prior Approval Number:    Date Approved/Denied:   PASRR Number:    Discharge Plan: SNF    Current Diagnoses: Patient Active Problem List   Diagnosis Date Noted   Hypoalbuminemia due to protein-calorie malnutrition [facility])    Hypokalemia    Dysphagia, post-stroke    Right pontine cerebrovascular accident ([place]) 06/01/2021   Left hemiplegia ([place]) 06/01/2021   Ischemic stroke/Acute right pontine infarct.  05/29/2021   Essential hypertension    Hypothyroidism    HOCM (hypertrophic obstructive cardiomyopathy) ([place]) 07/23/2014   Weakness of both legs 07/23/2014   Heart murmur     Orientation RESPIRATION BLADDER Height & Weight     Self, Place  Normal Incontinent Weight: 129 lb 6.6 oz (58.7 kg) Height:  '5\' 2"'$  (157.5 cm)  BEHAVIORAL SYMPTOMS/MOOD NEUROLOGICAL BOWEL NUTRITION STATUS      Incontinent Diet (Dys 1 textures, honey thick - sup A)  AMBULATORY STATUS COMMUNICATION OF NEEDS Skin   Limited Assist Verbally Normal                       Personal Care Assistance Level of Assistance  Bathing, Feeding, Dressing Bathing Assistance: Limited assistance Feeding assistance: Limited assistance Dressing Assistance: Limited assistance     Functional Limitations Info             SPECIAL CARE FACTORS FREQUENCY  PT (By licensed PT), OT (By licensed OT), Speech therapy     PT Frequency: 5x a week OT  Frequency: 5x a week     Speech Therapy Frequency: 5x a week      Contractures      Additional Factors Info  Code Status Code Status Info: DNR             Current Medications (06/27/2021):  This is the current hospital active medication list Current Facility-Administered Medications  Medication Dose Route Frequency Provider Last Rate Last Admin   (feeding supplement) PROSource Plus liquid 30 mL  30 mL Oral BID BM Jamse Arn, MD   30 mL at 06/27/21 0829   acetaminophen (TYLENOL) tablet 650 mg  650 mg Oral Q4H PRN Cathlyn Parsons, PA-C   650 mg at 06/22/21 2025   Or   acetaminophen (TYLENOL) 160 MG/5ML solution 650 mg  650 mg Per Tube Q4H PRN Angiulli, Lavon Paganini, PA-C       Or   acetaminophen (TYLENOL) suppository 650 mg  650 mg Rectal Q4H PRN Angiulli, Lavon Paganini, PA-C       amantadine (SYMMETREL) 50 MG/5ML solution 100 mg  100 mg Oral BID WC Jamse Arn, MD   100 mg at 06/27/21 [id number]   ascorbic acid (VITAMIN C) tablet 500 mg  500 mg Oral Daily Cathlyn Parsons, PA-C   500 mg at 06/27/21 0830   aspirin EC tablet 81 mg  81 mg Oral Q breakfast Cathlyn Parsons, PA-C   81  mg at 06/27/21 0830   atorvastatin (LIPITOR) tablet 40 mg  40 mg Oral Daily Cathlyn Parsons, PA-C   40 mg at 06/27/21 0830   clopidogrel (PLAVIX) tablet 75 mg  75 mg Oral Daily Cathlyn Parsons, PA-C   75 mg at 06/27/21 0830   donepezil (ARICEPT) tablet 5 mg  5 mg Oral QHS AngiulliLavon Paganini, PA-C   5 mg at 06/26/21 2053   famotidine (PEPCID) tablet 40 mg  40 mg Oral Daily Cathlyn Parsons, PA-C   40 mg at 06/27/21 [id number]   feeding supplement (ENSURE ENLIVE / ENSURE PLUS) liquid 237 mL  237 mL Oral BID BM Charlett Blake, MD   237 mL at 06/27/21 0830   levothyroxine (SYNTHROID) tablet 75 mcg  75 mcg Oral QAC breakfast Cathlyn Parsons, PA-C   75 mcg at 06/27/21 0518   polyethylene glycol (MIRALAX / GLYCOLAX) packet 17 g  17 g Oral Daily Cathlyn Parsons, PA-C   17 g at 06/27/21 [id number]    potassium chloride 20 MEQ/15ML (10%) solution 10 mEq  10 mEq Oral Daily Charlett Blake, MD   10 mEq at 06/27/21 [id number]   QUEtiapine (SEROQUEL) tablet 12.5 mg  12.5 mg Oral QPC supper Cathlyn Parsons, PA-C   12.5 mg at 06/26/21 1727   senna-docusate (Senokot-S) tablet 2 tablet  2 tablet Oral QHS Cathlyn Parsons, PA-C   2 tablet at 06/26/21 2053   sertraline (ZOLOFT) tablet 100 mg  100 mg Oral Daily Cathlyn Parsons, PA-C   100 mg at 06/27/21 0830   zinc sulfate capsule 220 mg  220 mg Oral Daily AngiulliLavon Paganini, PA-C   220 mg at 06/27/21 0830     Discharge Medications: Please see discharge summary for a list of discharge medications.  Relevant Imaging Results:  Relevant Lab Results:   Additional Information    [name]

## 2021-06-27 NOTE — Progress Notes (Signed)
PROGRESS NOTE   Subjective/Complaints: "My husband is in the hall"  Looks more alert  ROS: +fatigue, +left sided weakness  Objective:   No results found. Recent Labs    06/26/21 0511  WBC 7.9  HGB 12.7  HCT 38.2  PLT 222     Recent Labs    06/26/21 0511  NA 139  K 3.9  CL 101  CO2 27  GLUCOSE 112*  BUN 29*  CREATININE 1.07*  CALCIUM 9.4     Intake/Output Summary (Last 24 hours) at 06/27/2021 0743 Last data filed at 06/26/2021 1812 Gross per 24 hour  Intake 478 ml  Output --  Net 478 ml         Physical Exam: Vital Signs Blood pressure 115/60, pulse 73, temperature 97.9 F (36.6 C), temperature source Oral, resp. rate 18, height '5\' 2"'$  (1.575 m), weight 58.7 kg, SpO2 99 %.  General: No acute distress Mood and affect are appropriate Heart: Regular rate and rhythm no rubs murmurs or extra sounds Lungs: Clear to auscultation, breathing unlabored, no rales or wheezes Abdomen: Positive bowel sounds, soft nontender to palpation, nondistended Extremities: No clubbing, cyanosis, or edema Skin: No evidence of breakdown, no evidence of rash   Motor: trace triceps  on left , 3- hip knee ext syndergy on left o/w 0/5 No increase in tone noted  Assessment/Plan: 1. Functional deficits which require 3+ hours per day of interdisciplinary therapy in a comprehensive inpatient rehab setting. Physiatrist is providing close team supervision and 24 hour management of active medical problems listed below. Physiatrist and rehab team continue to assess barriers to discharge/monitor patient progress toward functional and medical goals  Care Tool:  Bathing    Body parts bathed by patient: Left arm, Right upper leg, Left upper leg, Abdomen, Chest, Face, Front perineal area, Right lower leg   Body parts bathed by helper: Left lower leg, Buttocks, Right arm     Bathing assist Assist Level: Maximal Assistance - Patient 24 -  49%     Upper Body Dressing/Undressing Upper body dressing   What is the patient wearing?: Pull over shirt    Upper body assist Assist Level: Maximal Assistance - Patient 25 - 49%    Lower Body Dressing/Undressing Lower body dressing      What is the patient wearing?: Incontinence brief, Pants     Lower body assist Assist for lower body dressing: 2 Helpers     Toileting Toileting    Toileting assist Assist for toileting: 2 Helpers     Transfers Chair/bed transfer  Transfers assist     Chair/bed transfer assist level: Moderate Assistance - Patient 50 - 74% (squat pivot)     Locomotion Ambulation   Ambulation assist   Ambulation activity did not occur: Safety/medical concerns  Assist level: 2 helpers (+2 mod A) Assistive device: Hand held assist Max distance: 74f   Walk 10 feet activity   Assist  Walk 10 feet activity did not occur: Safety/medical concerns  Assist level: 2 helpers (+2 mod/max A) Assistive device: Hand held assist   Walk 50 feet activity   Assist Walk 50 feet with 2 turns activity did not occur: Safety/medical  concerns         Walk 150 feet activity   Assist Walk 150 feet activity did not occur: Safety/medical concerns         Walk 10 feet on uneven surface  activity   Assist Walk 10 feet on uneven surfaces activity did not occur: Safety/medical concerns         Wheelchair     Assist Is the patient using a wheelchair?: Yes Type of Wheelchair: Manual Wheelchair activity did not occur: Safety/medical concerns         Wheelchair 50 feet with 2 turns activity    Assist    Wheelchair 50 feet with 2 turns activity did not occur: Safety/medical concerns       Wheelchair 150 feet activity     Assist  Wheelchair 150 feet activity did not occur: Safety/medical concerns       Blood pressure 115/60, pulse 73, temperature 97.9 F (36.6 C), temperature source Oral, resp. rate 18, height '5\' 2"'$   (1.575 m), weight 58.7 kg, SpO2 99 %.      Medical Problem List and Plan: 1.   Left-sided hemiplegia with dysarthria/dysphagia secondary to right pontine infarction as well as multiple small remote bilateral cerebellar lacunar infarcts Continue CIR PT, OT- team conf in am  Cont PRAFO, WHO nightly 2.  Impaired mobility: discontinue Lovenox for DVT prophylaxis since ambulating >150 feet             -antiplatelet therapy: Aspirin 81 mg daily and Plavix 75 mg day x1 month then Plavix alone Flexor withdrawal spasms LLE-zanaflex at night but may be causing am fatigue DC 3. Pain Management/chronic back pain: Continue Tylenol as needed  Controlled on 9/5 4. Depression: Continue Zoloft 100 mg daily, Aricept 5 mg nightly             -antipsychotic agents: Seroquel 12.5 mg q evening.  5. Neuropsych: This patient is NOT capable of making decisions on her own behalf. 6. Skin/Wound Care: Routine skin checks 7. Fluids/Electrolytes/Nutrition: Routine in and outs 8. Post stroke dysphagia.   Continue D2 thins  Advance diet as tolerated 9.  Hypertension.  Patient on verapamil 240 mg daily prior to admission as well as HCTZ 25 mg daily.     Vitals:   06/26/21 1940 06/27/21 0517  BP: (!) 110/53 115/60  Pulse: 75 73  Resp: 16 18  Temp: 98.2 F (36.8 C) 97.9 F (36.6 C)  SpO2: 95% 99%   Normotensive 9/6 10.  Hyperlipidemia.  Lipitor 11.  Hypothyroidism.  TSH 2.093 .Continue Synthroid 12.  Constipation.  MiraLAX scheduled daily.  Senokot S2 tablets nightly  13. Hypokalemia: Supplementation started   Potassium 3.7 on 8/31,  3.9 on 9/5 labs stable 14. Crackles and coughing with food intake: placed nursing order for must be up with meals, resolved  15.  Hypoalbuminemia  Supplement initiated on 8/13 16.  Cognitive deficits post stroke - has evidence of small vessel disease on imaging as well as the new pontine infarct   8/29- con't memory aids on wall.   18. Sedation with agitation  8/26- will stop  melatonin- if this doesn't help, might benefit from amantadine.    Amantadine 100 daily increase to twice daily on 8/31. - no clear cut benefit noted will discuss with team in am   Moved up seroquel to 2000- per pt sleeping well   LOS: 26 days A FACE TO [place] E Norah Devin 06/27/2021, 7:43 AM

## 2021-06-28 MED ORDER — DONEPEZIL HCL 5 MG PO TABS: 5.0000 mg | ORAL_TABLET | Freq: Every day | ORAL | 0 refills | Status: DC

## 2021-06-28 MED ORDER — FAMOTIDINE 20 MG PO TABS: 20.0000 mg | ORAL_TABLET | Freq: Every day | ORAL | Status: DC

## 2021-06-28 MED ORDER — ZINC SULFATE 220 (50 ZN) MG PO CAPS: 220.0000 mg | ORAL_CAPSULE | Freq: Every day | ORAL | Status: DC

## 2021-06-28 MED ORDER — QUETIAPINE FUMARATE 25 MG PO TABS
12.5000 mg | ORAL_TABLET | Freq: Every day | ORAL | 0 refills | Status: DC
Start: 2021-06-28 — End: 2022-02-15

## 2021-06-28 MED ORDER — AMANTADINE HCL 50 MG/5ML PO SOLN: 100.0000 mg | Freq: Two times a day (BID) | ORAL | 0 refills | Status: DC

## 2021-06-28 MED ORDER — SERTRALINE HCL 100 MG PO TABS: 100.0000 mg | ORAL_TABLET | Freq: Every day | ORAL | Status: DC

## 2021-06-28 NOTE — Progress Notes (Signed)
Patient ID: Vickie Mckee, female   DOB: 11/27/1930, 87 y.o.   MRN: [id number]  SW received confirmation of pt's bed at SNF. Pt will d/c on Friday 9/9  [place], [place]

## 2021-06-28 NOTE — Patient Care Conference (Signed)
Inpatient RehabilitationTeam Conference and Plan of Care Update Date: 06/28/2021   Time: 10:52 AM    Patient Name: Vickie Mckee      Medical Record Number: [id number]  Date of Birth: [birth year] Sex: Female         Room/Bed: 4M06C/4M06C-01 Payor Info: Payor: MEDICARE / Plan: MEDICARE PART A AND B / Product Type: *No Product type* /    Admit Date/Time:  06/01/2021  1:51 PM  Primary Diagnosis:  Left hemiplegia [facility])  Hospital Problems: Principal Problem:   Left hemiplegia ([place]) Active Problems:   Right pontine cerebrovascular accident ([place])   Hypoalbuminemia due to protein-calorie malnutrition ([place])   Hypokalemia   Dysphagia, post-stroke    Expected Discharge Date: Expected Discharge Date: 06/30/21  Team Members Present: Physician leading conference: Dr. Alysia Penna Social Worker Present: Erlene Quan, BSW Nurse Present: Dorien Chihuahua, RN PT Present: Page Spiro, PT OT Present: Clyda Greener, OT SLP Present: Sherren Kerns, SLP PPS Coordinator present : Gunnar Fusi, SLP     Current Status/Progress Goal Weekly Team Focus  Bowel/Bladder   Incontinent of b/b. LBM 9/6  Regain control of B/B  Toilet q2h while awake. and q3h at hs   Swallow/Nutrition/ Hydration   Dys 3 textures, thin liquids (diet advanced 9/6) sup A  sup A  Tolerance of Dys 3, thin lquids and implementation of safe swallow strategies with meals   ADL's   Min assist for UB bathing with mod asssit for dressing.  Min to mod for dynamic sitting balance.  total +2 (pt 35%) for LB selfcare sit to stand with mod assist for squat pivot transfers.  Brunnstrum stage II in the left arm and hand with trace digit flexion emerging.  downgraded to max assist  selfcare retraining, transfer training, balance retraining, DME education, neuromuscular re-education, therapeutic exercise, pt/family education   Mobility   rolling L supervision using bedrail, rolling R mod assist, supine>sitting with max assist using  bedrail, mod assist squat pivot transfers, max assist sit<>stands using R UE support, gait up to 60f overground with +2 mod assist and improved L LE gait mechancis able to advance ~50-75% of the way for short distance and stabilize L knee during stance with only min/mod assist using GRAFO  downgraded to mod/max assist overall  activity tolerance, L LE NMR, bed mobility training, transfer training, standing tolerance and balance, high intensity gait training, pt/family education   Communication   sup A to reduce speech rate and/or increase vocal intensity  sup A  implementation of breath support techniques.   Safety/Cognition/ Behavioral Observations  min A for basic cog, mod-to-max for recall  min A  external orientation aids, recall, problem solving, sustained attention, family education   Pain   Stated left leg hurts. PRN administered.  Pain level <3/10.  Assess Qshift and PRN   Skin   Skin intact  Maintain skin integrity  Assess Qshift and PRN     Discharge Planning:  D/C to SNF on 9/9   Team Discussion: Patient with fluctuating mental status with hx of vascular dementia. Appeared off for several days. Demonstrating fluency disorder symptoms/tongue tied with decreased memory, confusion (more in the afternoon-evenings) but less report of fatigue/sleepiness and is easily re-directed. Noted poor memory, poor recall, delayed and requires external aides for orientation. Patient on target to meet rehab goals: Appears to have plateaued cognitively; and requires supervision for basic cognitive tasks. Needs mod - max assist for higher level cognitive tasks. Has made good gains with swallowing and  advanced to D3 - thin liquid diet; meds whole in puree and supervision for safety. Needs min assit for upper body bathing and mod assist for upper body dressing. Requires total assist +2 for lower body care. Able to stand at the sink for hygience and clothes management with max assist. Needs mod assist for  supine - sit and mod assist for squat pivot transfers. Needs mod asist +2 for gait due to pushing left. Goals for discharge set for mod - max assist.  *See Care Plan and progress notes for long and short-term goals.   Revisions to Treatment Plan:  Downgraded goals with discharge to SNF AFO trial MD adjusted pepcid dosage due to cognitive issues noted   Teaching Needs: Medications and safety  Current Barriers to Discharge: Decreased caregiver support and Home enviroment access/layout  Possible Resolutions to Barriers: SNF recommended     Medical Summary Current Status: LLE pain improved no change in LUE strength, intermittent confusion , UA neg  Barriers to Discharge: Medical stability   Possible Resolutions to Barriers/Weekly Focus: occ BP spike optimize meds prior to d/c to SNF   Continued Need for Acute Rehabilitation Level of Care: The patient requires daily medical management by a physician with specialized training in physical medicine and rehabilitation for the following reasons: Direction of a multidisciplinary physical rehabilitation program to maximize functional independence : Yes Medical management of patient stability for increased activity during participation in an intensive rehabilitation regime.: Yes Analysis of laboratory values and/or radiology reports with any subsequent need for medication adjustment and/or medical intervention. : Yes   I attest that I was present, lead the team conference, and concur with the assessment and plan of the team.   Dorien Chihuahua B 06/28/2021, 11:31 AM

## 2021-06-28 NOTE — Discharge Summary (Addendum)
Physician Discharge Summary  Patient ID: Vickie Mckee MRN: [id number] DOB/AGE: 22-Aug-1931 87 y.o.  Admit date: 06/01/2021 Discharge date: 06/30/2021  Discharge Diagnoses:  Principal Problem:   Left hemiplegia [facility]) Active Problems:   Right pontine cerebrovascular accident ([place])   Hypoalbuminemia due to protein-calorie malnutrition ([place])   Hypokalemia   Dysphagia, post-stroke DVT prophylaxis Mood stabilization Hypertension Hyperlipidemia Hypothyroidism Constipation  Discharged Condition: Stable  Significant Diagnostic Studies: DG Chest Port 1 View  Result Date: 06/02/2021 CLINICAL DATA:  Aspiration pneumonia EXAM: PORTABLE CHEST 1 VIEW COMPARISON:  01/24/2015 FINDINGS: The heart size and mediastinal contours are within normal limits. Atherosclerotic calcification of the aortic knob. Chronically coarsened interstitial markings bilaterally. No focal airspace consolidation, pleural effusion, or pneumothorax. Oral contrast is visualized within the colon. The visualized skeletal structures are unremarkable. IMPRESSION: No active disease. Electronically Signed   By: Davina Poke D.O.   On: 06/02/2021 13:03   DG Swallowing Func-Speech Pathology  Result Date: 06/15/2021 Table formatting from the original result was not included. Objective Swallowing Evaluation: Type of Study: MBS-Modified Barium Swallow Study  Patient Details Name: Vickie Mckee MRN: [id number] Date of Birth: 10-21-1931 Today's Date: 06/15/2021 Time: SLP Start Time (ACUTE ONLY): 1330 -SLP Stop Time (ACUTE ONLY): [id number] SLP Time Calculation (min) (ACUTE ONLY): 25 min Past Medical History: Past Medical History: Diagnosis Date  Decreased sense of smell   and taste-negative CT scan-2011  Depression   Essential hypertension   GERD (gastroesophageal reflux disease)   Heart murmur   heard first time 4/12  History of mammogram   2010 and she does not want to repeat them anymore  HOCM (hypertrophic obstructive cardiomyopathy) ([place])   a.  Dx by echo 05/2014.  Hypothyroidism   IBS (irritable bowel syndrome)   Impaired fasting glucose   Low back pain   Lumbar radiculopathy   with negative MRI (1991)  Postmenopausal   with osteopenia, bisphosphonates 1/05-1/11, improved osteopenia 4/12-repeat planned late 2015  Shingles   2015  Thyroid nodule   Vitamin D deficiency   repleted on weekly vitamin D 50000 iu Past Surgical History: Past Surgical History: Procedure Laterality Date  ABDOMINAL HYSTERECTOMY    BREAST BIOPSY    x2  CATARACT EXTRACTION    removal with IOL bilateral  GLAUCOMA SURGERY    laser  skin cancer removal    THYROIDECTOMY    subtotal HPI: 87 y.o. female with medical history significant of essential hypertension, hypothyroidism, gastroesophageal reflux disease, depression, vitamin D deficiency, history of HOCM and chronic low back pain; who presented to the hospital secondary to left-sided weakness and dysarthria.  Patient is on clear about exact  Timing of initiation of her symptoms; she reported last felt normal after waking up this morning around 7 AM.  Patient reported ambulating as she normally does after waking up in the morning, and anywhere in between 8 and 9 she has no recollection of the events, family was not able to reach her and contacted EMS in the way to her house.  Patient was found on the floor demonstrating left-sided weakness, left lateralized gaze, vomiting content next to her and also dysarthria; MRI reveals "Acute right pontine infarct. Mild associated edema without mass effect. Multiple small remote bilateral cerebellar lacunar infarcts  Subjective: Nursing with pt when SLP arrived; coughing with medication administration with nectar-thickened liquids; changed to puree/whole meds Assessment / Plan / Recommendation CHL IP CLINICAL IMPRESSIONS 06/15/2021 Clinical Impression Patient presents with a mild oral and a mild-moderate pharyngeal phase dysphagia that  is improved as compared to most recent MBS on 8/9. during oral  phase, patient exhibited a mild delay in anterior to posterior transit of liquid and solid boluses and premature spillage of thin and nectar thick liquids. During pharyngeal phase, patient exhibited two instances of very trace penetration (PAS 2, 3) during the swallow with thin liquids but without aspiration and with eventual clearance of penetrate. Swallow initiation was delayed to level of pyriform sinus with thin liquids and nectar thick liquids and delayed to level of vallecular sinus with puree solids and regular solids. Trace vallecular sinus residuals were observed after initial swallow with regular solids, but cleared fully after subsequent swallows. No significant pharyngeal residuals observed with thin liquids, puree soilds, nectar thick liquids. SLP is recommending to upgrade patient's current diet to Dys 2 (fine chop), thin liquids. SLP Visit Diagnosis Dysphagia, oropharyngeal phase (R13.12) Attention and concentration deficit following -- Frontal lobe and executive function deficit following -- Impact on safety and function Mild aspiration risk   CHL IP TREATMENT RECOMMENDATION 05/30/2021 Treatment Recommendations Therapy as outlined in treatment plan below   Prognosis 05/30/2021 Prognosis for Safe Diet Advancement Good Barriers to Reach Goals Cognitive deficits Barriers/Prognosis Comment -- CHL IP DIET RECOMMENDATION 06/15/2021 SLP Diet Recommendations Dysphagia 2 (Fine chop) solids;Thin liquid Liquid Administration via Cup;Straw Medication Administration Whole meds with puree Compensations Minimize environmental distractions;Slow rate;Small sips/bites Postural Changes Seated upright at 90 degrees;Remain semi-upright after after feeds/meals (Comment)   CHL IP OTHER RECOMMENDATIONS 06/15/2021 Recommended Consults -- Oral Care Recommendations Oral care BID Other Recommendations --   CHL IP FOLLOW UP RECOMMENDATIONS 05/30/2021 Follow up Recommendations 24 hour supervision/assistance;Other (comment)   CHL IP  FREQUENCY AND DURATION 05/30/2021 Speech Therapy Frequency (ACUTE ONLY) min 2x/week Treatment Duration --      CHL IP ORAL PHASE 06/15/2021 Oral Phase Impaired Oral - Pudding Teaspoon -- Oral - Pudding Cup -- Oral - Honey Teaspoon -- Oral - Honey Cup -- Oral - Nectar Teaspoon NT Oral - Nectar Cup WFL Oral - Nectar Straw NT Oral - Thin Teaspoon Premature spillage Oral - Thin Cup Premature spillage Oral - Thin Straw Premature spillage Oral - Puree Weak lingual manipulation;Reduced posterior propulsion Oral - Mech Soft NT Oral - Regular Weak lingual manipulation;Reduced posterior propulsion Oral - Multi-Consistency NT Oral - Pill WFL Oral Phase - Comment --  CHL IP PHARYNGEAL PHASE 06/15/2021 Pharyngeal Phase Impaired Pharyngeal- Pudding Teaspoon -- Pharyngeal -- Pharyngeal- Pudding Cup -- Pharyngeal -- Pharyngeal- Honey Teaspoon -- Pharyngeal -- Pharyngeal- Honey Cup -- Pharyngeal -- Pharyngeal- Nectar Teaspoon NT Pharyngeal -- Pharyngeal- Nectar Cup Delayed swallow initiation-pyriform sinuses Pharyngeal Material does not enter airway Pharyngeal- Nectar Straw -- Pharyngeal -- Pharyngeal- Thin Teaspoon Delayed swallow initiation-pyriform sinuses Pharyngeal Material does not enter airway Pharyngeal- Thin Cup Delayed swallow initiation-pyriform sinuses;Penetration/Aspiration during swallow Pharyngeal Material enters airway, remains ABOVE vocal cords and not ejected out Pharyngeal- Thin Straw Delayed swallow initiation-pyriform sinuses;Penetration/Aspiration during swallow Pharyngeal Material enters airway, remains ABOVE vocal cords then ejected out Pharyngeal- Puree Delayed swallow initiation-vallecula;Pharyngeal residue - valleculae Pharyngeal -- Pharyngeal- Mechanical Soft NT Pharyngeal -- Pharyngeal- Regular Delayed swallow initiation-vallecula;Pharyngeal residue - valleculae Pharyngeal -- Pharyngeal- Multi-consistency NT Pharyngeal -- Pharyngeal- Pill WFL Pharyngeal -- Pharyngeal Comment --  CHL IP CERVICAL ESOPHAGEAL  PHASE 06/15/2021 Cervical Esophageal Phase WFL Pudding Teaspoon -- Pudding Cup -- Honey Teaspoon -- Honey Cup -- Nectar Teaspoon -- Nectar Cup -- Nectar Straw -- Thin Teaspoon -- Thin Cup -- Thin Straw -- Puree -- Mechanical Soft -- Regular --  Multi-consistency -- Pill -- Cervical Esophageal Comment -- Sonia Baller, MA, CCC-SLP Speech Therapy              Labs:  Basic Metabolic Panel: Recent Labs  Lab 06/26/21 0511  NA 139  K 3.9  CL 101  CO2 27  GLUCOSE 112*  BUN 29*  CREATININE 1.07*  CALCIUM 9.4    CBC: Recent Labs  Lab 06/26/21 0511  WBC 7.9  NEUTROABS 3.8  HGB 12.7  HCT 38.2  MCV 90.7  PLT 222    CBG: No results for input(s): GLUCAP in the last 168 hours.  Family history.  Mother with breast cancer.  Father with hypertension and CVA.  Sister with congestive heart failure Brother with lung cancer.  Denies any colon cancer esophageal cancer or rectal cancer  Brief HPI:   Vickie Mckee is a 87 y.o. right-handed female with history of hypertension hypothyroidism chronic low back pain hypertrophic obstructive cardiomyopathy some memory loss maintained on Aricept.  Per chart review lives alone independent with assistive device.  Two-level home bed and bath main level with ramped entrance.  She has family and friends to assist her as needed.  [facility]ed [facility] with acute onset of left-sided weakness and dysarthria as well as left lateral gaze and 1 episode of vomiting.  Cranial CT scan negative for acute changes.  CT angiogram of head and neck showed no large vessel occlusion or high-grade stenosis.  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  Patient did not receive tPA.  MRI showed acute right pontine infarction.  Multiple small remote bilateral cerebellar lacunar infarcts.  Echocardiogram with ejection fraction of 60 to 65% no wall motion  abnormalities.  EEG negative for seizure.  Admission chemistries alcohol negative potassium 2.6 urine drug screen negative.  Maintained on aspirin 81 mg daily and Plavix 75 mg daily for CVA prophylaxis x1 month then anticipated monotherapy.  Subcutaneous Lovenox for DVT prophylaxis.  Dysphagia #1 nectar thick liquid diet.  Therapy evaluations completed due to patient's left-sided weakness and dysarthria was admitted for a comprehensive rehab program.   Hospital Course: Vickie Mckee was admitted to rehab 06/01/2021 for inpatient therapies to consist of PT, ST and OT at least three hours five days a week. Past admission physiatrist, therapy team and rehab RN have worked together to provide customized collaborative inpatient rehab.  Pertain to patient's right pontine infarction as well as remote small bilateral cerebellar infarcts remained stable.  Maintained on aspirin and Plavix x1 month then aspirin alone confirmed with neurology services.  Patient had been on subcutaneous Lovenox for DVT prophylaxis during hospital course.  She would follow-up neurology services.  Chronic back pain Tylenol as needed.  Mood stabilization with the use of Zoloft and Aricept scheduled she was receiving Seroquel in the evening with good results.  Her diet has been advanced to mechanical soft.  Synthroid ongoing for hypothyroidism.  Permissive hypertension currently on no antihypertensive medication she had been on verapamil and HCTZ prior to admission could be resumed as needed.   Blood pressures were monitored on TID basis and soft and monitored     Rehab course: During patient's stay in rehab weekly team conferences were held to monitor patient's progress, set goals and discuss barriers to discharge. At admission, patient required moderate assist supine to sit mod max assist sit to  supine moderate assist anterior posterior transfers  Physical exam.  Blood pressure 158/70 pulse 71 temperature 98 respirations 18 oxygen  saturations 98% room air Constitutional.  No acute distress HEENT Head.  Normocephalic and atraumatic Eyes.  Pupils round and reactive to light no discharge without nystagmus Neck.  Supple nontender no JVD without thyromegaly Cardiac regular rate and rhythm any extra sounds or murmur heard Abdomen.  Soft nontender positive bowel sounds without rebound Respiratory effort normal no respiratory distress without wheeze Skin.  Warm and dry she did have some bruising from needle sticks Musculoskeletal Comments.  Right upper extremity 5/5 Right lower extremity 5 -/5 Left upper extremity 0/5 Left lower extremity hip flexion 1/5 knee extension knee flexion 0/5 dorsi plantarflexion 4 -/5 Neurologic.  Alert severely dysarthric follows commands.  She did have some decrease in attention.  He/She  has had improvement in activity tolerance, balance, postural control as well as ability to compensate for deficits. He/She has had improvement in functional use RUE/LUE  and RLE/LLE as well as improvement in awareness.  Donned her pants and shoes at bed level with total assist.  Required max assist for supine to sit for trunk and bilateral lower extremity management.  Dependent in standing frame and worked on functional reaching with right upper extremity.  Assisted back to wheelchair via squat pivot transfers moderate assist.  She needed max assist to complete sit to stand with maintaining standing balance without any upper extremity support for ADLs.  Completed several intervals of standing while having her use the right upper extremity to complete rotary visual pursuits.  Speech therapy follow-up cognition and swallowing.  Patient displayed improved awareness of labial spillage and independently cleared with washcloth throughout sessions.  Patient required mod max verbal cues for recall and consistent reminders of her hospital course.  Due to limited support at home was felt skilled nursing facility was needed and  bed becoming available 06/30/2021.       Disposition: Discharge to skilled nursing facility    Diet: Dysphagia #3 thin liquids  Special Instructions: No smoking or alcohol  Medications at discharge 1.  Tylenol as needed 2.  Vitamin C 500 mg p.o. daily 3.  Lipitor 40 mg p.o. daily 4.  Aricept 5 mg p.o. nightly 5.  Pepcid 20 mg p.o. daily 6.  Synthroid 75 mcg p.o. daily 7.  MiraLAX daily hold for loose stools 8.  Seroquel 12.5 mg daily after supper 9.  Zoloft 100 mg p.o. daily 10.  Zinc sulfate 220 mg p.o. daily 11.  Aspirin 81 mg daily 12  Vitamin D 1000 UT 1 capsule daily 13.  Vitamin B6 25 mg 1 p.o. daily  30-35 minutes was spent completing discharge summary and discharge planning Discharge Instructions     Ambulatory referral to Neurology   Complete by: As directed    An appointment is requested in approximately: 4 weeks right pontine infarction   Ambulatory referral to Physical Medicine Rehab   Complete by: As directed    Follow-up 1 month right pontine infarction.  Patient for skilled nursing facility placement        Contact information for follow-up providers     Kirsteins, Luanna Salk, MD Follow up.   Specialty: Physical Medicine and Rehabilitation Why: Office to call for appointment Contact information: [place] [address] [phone]              Contact information for after-discharge care     Destination  [facility] .   Service: Skilled Nursing Contact information: [address] [place] [id number]                     Signed: Lavon Paganini [place] 06/30/2021, 5:20 AM

## 2021-06-28 NOTE — Plan of Care (Signed)
  Problem: RH Memory Goal: LTG Patient will use memory compensatory aids to (SLP) Description: LTG:  Patient will use memory compensatory aids to recall biographical/new, daily complex information with cues (SLP) Flowsheets (Taken 06/28/2021 1100) LTG: Patient will use memory compensatory aids to (SLP):  Moderate Assistance - Patient 50 - 74%  Maximal Assistance - Patient 25 - 49% Note: Goal downgraded due to slow progress, lethargy, and fluctuating alertness

## 2021-06-28 NOTE — Progress Notes (Signed)
Occupational Therapy Session Note  Patient Details  Name: Vickie Mckee MRN: [id number] Date of Birth: 02-Feb-1931  Today's Date: 06/28/2021 OT Individual Time: 1102-1200 OT Individual Time Calculation (min): 58 min    Short Term Goals: Week 4:  OT Short Term Goal 1 (Week 4): Continue working on established LTGs at [employer] to [employer].  Skilled Therapeutic Interventions/Progress Updates:    Pt in wheelchair to start exhibiting disorientation with decreased ability to be redirected.  Worked on bathing and dressing sit to stand from the wheelchair at the sink.  Pt with increased perseveration on being gone this weekend past and when she came back her bed was a mess.  Therapist attempted to re-direct her throughout session to current place and situation, however she only increased in agitation and disagreement when attempted.  Eventually, shifted focus back to selfcare tasks to de-escalate the situation.  She was able to complete UB bathing with min assist and UB dressing with mod assist for donning a pullover shirt.  She was able to complete LB bathing at max assist overall with LB dressing at the same level.  Increased ability to stand better and transition to standing noted with RUE placed on the sink.  Still with increased flexed posture in her head and trunk with max facilitation needed to maintain left knee extension.  Her daughter came in toward the end of session and pt was not as agitated, but still confused.  Discussed confusion with daughter as well since pt has only been pleasantly confused with this therapist up until this point.  She was left sitting up in the wheelchair with the call button and phone in reach and safety belt in place.    Therapy Documentation Precautions:  Precautions Precautions: Fall Precaution Comments: L hemiparesis Restrictions Weight Bearing Restrictions: No  Pain: Pain Assessment Pain Scale: Faces Pain Score: 0-No pain ADL: See Care Tool Section for some  details of mobility and selfcare  Therapy/Group: Individual Therapy  Kyara Boxer OTR/L 06/28/2021, 12:15 PM

## 2021-06-28 NOTE — Progress Notes (Signed)
Physical Therapy Session Note  Patient Details  Name: Vickie Mckee MRN: [id number] Date of Birth: 08/12/1931  Today's Date: 06/28/2021 PT Individual Time: 1405-1507 PT Individual Time Calculation (min): 62 min   Short Term Goals: Week 4:  PT Short Term Goal 1 (Week 4): = to LTGs based on ELOS  Skilled Therapeutic Interventions/Progress Updates:    Pt received sitting in w/c reporting need to use bathroom. Pt agreeable to therapy session. Sit>stand w/c>stedy with mod assist for lifting to stand from this low height. Stedy transfer in/out of bathroom. Sit>stand from stedy seat with min/mod assist from 1 while +2 performed total assist LB clothing management - continent of BM. Pt's daughter arrived and discussed plan for transportation to SNF on Friday - pt's daughter prefers patient to ride with family; however, they only have small SUVs ([employer] Rav[employer]). Transported patient to main hospital entrance to assess whether pt could safely perform car transfer to pt's daughter's vehicle. Assisted pt sit>stand w/c>R UE support on car door with heavy mod assist of 1 for lifting and blocking L knee. Deemed that the car seat was too high for pt to safely complete transfer. Pt/family in agreement with recommendation for ambulance transport.   Transported pt back up to CIR floor. Donned L LE [place] Reaction GRAFO. Sit>stand w/c>R HHA from +2 with heavy mod assist of 1 for lifting to stand and bringing weight forward while blocking L knee - requires assistance to prevent posterior LOB. Gait training ~64f with +2 R HHA and mod assist from therapist for balance and L LE gait mechanics - cuing throughout for trunk/hip extension to maintain upright posture - pt able to advance L LE ~75-100% of the way >50% of the time during swing but requires facilitation to avoid scissoring/excessive adduction - requires min/mod assist to block L knee during stance while facilitating increased hip extension and anterior pelvic  translation over L stance phase.  Transported back to room and pt agreeable to remain sitting up in w/c - left with needs in reach, seat belt alarm on, word search set-up, and family present.   Therapy Documentation Precautions:  Precautions Precautions: Fall Precaution Comments: L hemiparesis Restrictions Weight Bearing Restrictions: No   Pain: No reports of pain throughout session.    Therapy/Group: Individual Therapy  CTawana Scale, PT, DPT, NCS, CSRS 06/28/2021, 12:33 PM

## 2021-06-28 NOTE — Progress Notes (Signed)
Physical Therapy Session Note  Patient Details  Name: Vickie Mckee MRN: [id number] Date of Birth: 1931/03/05  Today's Date: 06/28/2021 PT Individual Time: 0815-0900 PT Individual Time Calculation (min): 45 min   Short Term Goals: Week 4:  PT Short Term Goal 1 (Week 4): = to LTGs based on ELOS  Skilled Therapeutic Interventions/Progress Updates:      Pt supine in bed to start session - finishing up her breakfast with NT assisting. Pt agreeable to therapy despite frequent statements of being "too tired" and wanting to stay in bed. Donned shoes with totalA for time. Assisted to EOB with mod/maxA with use of bed features. Upon sitting EOB, she reports need to void. Provided Stedy and assisted to toilet with dependant transfer, able to stand in [place] with minA and sit in perched position with CGA. Pt required totalA for lower body dressing and was continent of bladder once sitting on toilet. Pt able to stand with minA in Stedy from 3-1 [facility] over toilet and then transferred over to sinkside to work on sitting posture, body awareness, and sit<>stand transfers while using the mirror for visual feedback. Completed a total of 1x5 sit<>stands in Stedy from perched position - progressing from minA to [place]. With each stand, worked on standing tolerance of up to ~30-45 seconds prior to fatigue. She would report "i'm falling" resulting in gradual sitting back to perched position on [place]. Pt then transferred to her manual TIS w/c in [facility] and focused remainder of session on w/c mobility via hemi technique. She had difficulty using both RUE and RLE and would self select to rely only on RLE to propel. She was able to propel herself ~27f + ~367fwith supervision and minA required for turns and navigating doorways. She completed session reclined in w/c with safety belt alarm on, all needs within reach, pt made comfortable.   Therapy Documentation Precautions:  Precautions Precautions: Fall Precaution Comments: L  hemiparesis Restrictions Weight Bearing Restrictions: No General:    Therapy/Group: Individual Therapy  ChAlger Simons/04/2021, 8:56 AM

## 2021-06-28 NOTE — Progress Notes (Signed)
Speech Language Pathology Daily Session Note  Patient Details  Name: Vickie Mckee MRN: [id number] Date of Birth: 02-14-1931  Today's Date: 06/28/2021 SLP Individual Time: 0900-1000 SLP Individual Time Calculation (min): 60 min  Short Term Goals: Week 4: SLP Short Term Goal 1 (Week 4): ST=LTG due to ELOS  Skilled Therapeutic Interventions: Patient agreeable to skilled ST intervention with focus on cognitive goals. Upon arrival patient was upright in TIS sleeping. She was easy to arouse with verbal stimuli however remained drowsy for first half of session. Patient eventually became more alert and participated in session in speech therapy office. SLP facilitated orientation with sup-to-min A verbal to use external visual aids to orient to time ("April" unsure of day of week, month, and year) and place. Patient was oriented to person and situation without cues. Patient continues to require max A verbal repetition for working memory and short-term recall with limited carry over day-to-day (e.g., SLP facilitated counting change and basic math with sup A verbal cues mainly for vision. Facilitated problem solving with time scenarios (if your daughters arrived at 72 and stayed for 45 minutes, what time would they leave?) with sup-to-min A verbal cues and additional processing time. Patient manipulated time on practice clock with 90% accuracy. Patient consumed thin liquids by cup throughout session with no overt s/sx of aspiration. Patient was left in TIS with alarm activated and immediate needs within reach at end of session. Continue per current plan of care.      Pain Pain Assessment Pain Scale: 0-10 Pain Score: 0-No pain  Therapy/Group: Individual Therapy  Patty Sermons 06/28/2021, 10:13 AM

## 2021-06-28 NOTE — Progress Notes (Signed)
PROGRESS NOTE   Subjective/Complaints: Per RN was more confused yesterday , UA neg ROS: +fatigue, +left sided weakness  Objective:   No results found. Recent Labs    06/26/21 0511  WBC 7.9  HGB 12.7  HCT 38.2  PLT 222     Recent Labs    06/26/21 0511  NA 139  K 3.9  CL 101  CO2 27  GLUCOSE 112*  BUN 29*  CREATININE 1.07*  CALCIUM 9.4     Intake/Output Summary (Last 24 hours) at 06/28/2021 0736 Last data filed at 06/27/2021 1700 Gross per 24 hour  Intake 252 ml  Output --  Net 252 ml         Physical Exam: Vital Signs Blood pressure (!) 115/56, pulse 65, temperature 97.8 F (36.6 C), resp. rate 18, height '5\' 2"'  (1.575 m), weight 58.7 kg, SpO2 96 %.  General: No acute distress Mood and affect are appropriate Heart: Regular rate and rhythm no rubs murmurs or extra sounds Lungs: Clear to auscultation, breathing unlabored, no rales or wheezes Abdomen: Positive bowel sounds, soft nontender to palpation, nondistended Extremities: No clubbing, cyanosis, or edema Skin: No evidence of breakdown, no evidence of rash   Motor: trace triceps  on left , 3- hip knee ext syndergy on left o/w 0/5 No increase in tone noted  Assessment/Plan: 1. Functional deficits which require 3+ hours per day of interdisciplinary therapy in a comprehensive inpatient rehab setting. Physiatrist is providing close team supervision and 24 hour management of active medical problems listed below. Physiatrist and rehab team continue to assess barriers to discharge/monitor patient progress toward functional and medical goals  Care Tool:  Bathing    Body parts bathed by patient: Left arm, Right upper leg, Left upper leg, Abdomen, Chest, Face, Front perineal area, Right lower leg   Body parts bathed by helper: Left lower leg, Buttocks, Right arm     Bathing assist Assist Level: Maximal Assistance - Patient 24 - 49%     Upper Body  Dressing/Undressing Upper body dressing   What is the patient wearing?: Pull over shirt    Upper body assist Assist Level: Maximal Assistance - Patient 25 - 49%    Lower Body Dressing/Undressing Lower body dressing      What is the patient wearing?: Incontinence brief, Pants     Lower body assist Assist for lower body dressing: 2 Helpers     Toileting Toileting    Toileting assist Assist for toileting: 2 Helpers     Transfers Chair/bed transfer  Transfers assist     Chair/bed transfer assist level: Maximal Assistance - Patient 25 - 49% (stand pivot)     Locomotion Ambulation   Ambulation assist   Ambulation activity did not occur: Safety/medical concerns  Assist level: 2 helpers (+2 mod A) Assistive device: Hand held assist Max distance: 12f   Walk 10 feet activity   Assist  Walk 10 feet activity did not occur: Safety/medical concerns  Assist level: 2 helpers (+2 mod/max A) Assistive device: Hand held assist   Walk 50 feet activity   Assist Walk 50 feet with 2 turns activity did not occur: Safety/medical concerns  Walk 150 feet activity   Assist Walk 150 feet activity did not occur: Safety/medical concerns         Walk 10 feet on uneven surface  activity   Assist Walk 10 feet on uneven surfaces activity did not occur: Safety/medical concerns         Wheelchair     Assist Is the patient using a wheelchair?: Yes Type of Wheelchair: Manual Wheelchair activity did not occur: Safety/medical concerns         Wheelchair 50 feet with 2 turns activity    Assist    Wheelchair 50 feet with 2 turns activity did not occur: Safety/medical concerns       Wheelchair 150 feet activity     Assist  Wheelchair 150 feet activity did not occur: Safety/medical concerns       Blood pressure (!) 115/56, pulse 65, temperature 97.8 F (36.6 C), resp. rate 18, height '5\' 2"'  (1.575 m), weight 58.7 kg, SpO2 96 %.       Medical Problem List and Plan: 1.   Left-sided hemiplegia with dysarthria/dysphagia secondary to right pontine infarction as well as multiple small remote bilateral cerebellar lacunar infarcts Continue CIR PT, OT- Team conference today please see physician documentation under team conference tab, met with team  to discuss problems,progress, and goals. Formulized individual treatment plan based on medical history, underlying problem and comorbidities. Plan D/C to SNF this week   Cont PRAFO, WHO nightly 2.  Impaired mobility: discontinue Lovenox for DVT prophylaxis since ambulating >150 feet             -antiplatelet therapy: Aspirin 81 mg daily and Plavix 75 mg day x1 month then Plavix alone D/c ASA 9/7 3. Pain Management/chronic back pain: Continue Tylenol as needed  Controlled on 9/7 4. Depression: Continue Zoloft 100 mg daily, Aricept 5 mg nightly             -antipsychotic agents: Seroquel 12.5 mg q evening.  5. Neuropsych: This patient is NOT capable of making decisions on her own behalf. 6. Skin/Wound Care: Routine skin checks 7. Fluids/Electrolytes/Nutrition: Routine in and outs 8. Post stroke dysphagia.   Continue D2 thins  Advance diet as tolerated 9.  Hypertension.  Patient on verapamil 240 mg daily prior to admission as well as HCTZ 25 mg daily.     Vitals:   06/27/21 1930 06/28/21 0542  BP: (!) 134/119 (!) 115/56  Pulse: 88 65  Resp: 17 18  Temp: 98.3 F (36.8 C) 97.8 F (36.6 C)  SpO2: 94% 96%   Normotensive 9/7, had elevated reading x 1 yesterday cont off antihypertensive med  10.  Hyperlipidemia.  Lipitor 11.  Hypothyroidism.  TSH 2.093 .Continue Synthroid 12.  Constipation.  MiraLAX scheduled daily.  Senokot S2 tablets nightly  13. Hypokalemia: Supplementation started   Potassium 3.7 on 8/31,  3.9 on 9/5 labs stable 14. Crackles and coughing with food intake: placed nursing order for must be up with meals, resolved  15.  Hypoalbuminemia  Supplement initiated on  8/13 16.  Cognitive deficits post stroke - has evidence of small vessel disease on imaging as well as the new pontine infarct   Has fluctuation of mental status as expected UA neg, no change in motor cont to monitor   18. Sedation with agitation  8/26- will stop melatonin- if this doesn't help, might benefit from amantadine.    Amantadine 100 daily increase to twice daily on 8/31. - no clear cut benefit noted will discuss  with team today    Moved up seroquel to 2000- per pt sleeping well   LOS: 27 days A FACE TO Emery E Vickie Mckee 06/28/2021, 7:36 AM

## 2021-06-28 NOTE — Progress Notes (Signed)
Patient ID: Vickie Mckee, female   DOB: 09/18/1931, 87 y.o.   MRN: [id number] Team Conference Report to Patient/Family  Team Conference discussion was reviewed with the patient and caregiver, including goals, any changes in plan of care and target discharge date.  Patient and caregiver express understanding and are in agreement.  The patient has a target discharge date of 06/30/21.  SW spoke with pt daughter Butch Penny at bedside. Pt will d/c to SNF on 9/9. Pt goals to be downgraded. No additional questions or concerns.   [name] 06/28/2021, 12:49 PM

## 2021-06-29 LAB — URINE CULTURE: Culture: 20000 — AB

## 2021-06-29 LAB — SARS CORONAVIRUS 2 (TAT 6-24 HRS): SARS Coronavirus 2: NEGATIVE

## 2021-06-29 NOTE — Progress Notes (Signed)
Occupational Therapy Discharge Summary  Patient Details  Name: Vickie Mckee MRN: [id number] Date of Birth: 07/12/1931  Today's Date: 06/29/2021 OT Individual Time: 4917-9150 OT Individual Time Calculation (min): 60 min   Session Note:  Pt in wheelchair with her daughters present.  They left and pt was transported down to the therapy gym via wheelchair.  Max assist for stand pivot transfer to the therapy mat with increased trunk and cervical flexion noted as well as decreased left knee and hip extension.  Had her work initially on active movement in the LUE with use of a tilted stool and therapy ball placed on top of a board.  She was able to demonstrate trace movement of shoulder flexion and extension as well as elbow extension.  Decreased ability to activate elbow flexion or any wrist movements.  She also exhibited movements of internal rotation as well as external rotation.  Completed sit to stand with max assist as well for 3 intervals with pt needing max facilitation for upright trunk and for left hip and knee extension.  Finished session with transfer back to the wheelchair at [facility] assist level and transfer squat pivot to the bed.  She was left in supine with call button and phone in reach with safety alarm in place.    Patient has met 11 of 14 long term goals due to improved activity tolerance, improved balance, postural control, ability to compensate for deficits, and functional use of  LEFT upper and LEFT lower extremity.  Patient to discharge at [facility] Max Assist level.  Patient's care partner unavailable to provide the necessary physical and cognitive assistance at discharge.    Reasons goals not met: Pt continues to need max assist for memory and awareness.  Recommendation:  Patient will benefit from ongoing skilled OT services in skilled nursing facility setting to continue to advance functional skills in the area of BADL and Reduce care partner burden.  Pt continues to demonstrate  extensive LUE and LLE hemiparesis as well as decreased memory and overall mod to max assist level for selfcare tasks sit to stand.  Will benefit from extended stay at [facility] for further rehab.    Equipment: No equipment provided  Reasons for discharge: treatment goals met and discharge from hospital  Patient/family agrees with progress made and goals achieved: Yes  OT Discharge Precautions/Restrictions  Precautions Precautions: Fall Precaution Comments: L hemiparesis Restrictions Weight Bearing Restrictions: No  Pain Pain Assessment Pain Scale: Faces Pain Score: 0-No pain ADL ADL Eating: Supervision/safety Where Assessed-Eating: Wheelchair Grooming: Supervision/safety Where Assessed-Grooming: Wheelchair Upper Body Bathing: Minimal assistance Where Assessed-Upper Body Bathing: Wheelchair, Sitting at sink Lower Body Bathing: Maximal assistance Where Assessed-Lower Body Bathing: Wheelchair Upper Body Dressing: Moderate assistance Where Assessed-Upper Body Dressing: Wheelchair Lower Body Dressing: Maximal assistance Where Assessed-Lower Body Dressing: Wheelchair, Sitting at sink, Standing at sink Toileting: Maximal assistance Where Assessed-Toileting: Bedside Commode Toilet Transfer: Moderate assistance Toilet Transfer Method: Squat pivot Toilet Transfer Equipment: Radiographer, therapeutic: Not assessed Social research officer, government: Not assessed Vision Baseline Vision/History: 1 Wears glasses Wears Glasses: At all times Patient Visual Report: No change from baseline Vision Assessment?: Yes Eye Alignment: Within Functional Limits Ocular Range of Motion: Within Functional Limits Alignment/Gaze Preference: Head tilt (right head tilt) Tracking/Visual Pursuits: Able to track stimulus in all quads without difficulty Visual Fields:  (not tested) Perception  Perception: Within Functional Limits Praxis Praxis: Intact Cognition Overall Cognitive Status:  Impaired/Different from baseline Arousal/Alertness: Awake/alert Orientation Level: Oriented to person;Disoriented to place;Disoriented to  time;Disoriented to situation Year: 2020 Month:  (did not state when asked stated "I don't know") Day of Week: Incorrect Attention: Focused;Sustained Focused Attention: Appears intact Sustained Attention: Impaired Sustained Attention Impairment: Verbal basic;Functional basic Memory: Impaired Memory Impairment: Storage deficit;Decreased recall of new information;Retrieval deficit Decreased Short Term Memory: Verbal basic Immediate Memory Recall: Sock;Blue;Bed Memory Recall Sock: With Cue Memory Recall Blue: Not able to recall Memory Recall Bed: Not able to recall Awareness: Impaired Awareness Impairment: Intellectual impairment;Anticipatory impairment;Emergent impairment Problem Solving: Impaired Problem Solving Impairment: Verbal basic;Functional basic Behaviors: Perseveration Safety/Judgment: Impaired Sensation Sensation Light Touch: Appears Intact Hot/Cold: Not tested Proprioception: Appears Intact Stereognosis: Not tested Coordination Gross Motor Movements are Fluid and Coordinated: No Fine Motor Movements are Fluid and Coordinated: No Coordination and Movement Description: Pt with only trace movement in the left arm and hand.  Max hand over hand assist needed for integration into selfcare tasks. Motor  Motor Motor: Abnormal postural alignment and control;Hemiplegia Motor - Discharge Observations: Still with postural alignment issues and LUE and LLE hemiparesis Mobility  Bed Mobility Bed Mobility: Rolling Right;Rolling Left;Supine to Sit;Sit to Supine Rolling Right: Maximal Assistance - Patient 25-49% Rolling Left: Minimal Assistance - Patient > 75% Supine to Sit: Moderate Assistance - Patient 50-74% Sit to Supine: Maximal Assistance - Patient 25-49% Transfers Sit to Stand: Maximal Assistance - Patient 25-49% Stand to Sit: Moderate  Assistance - Patient 50-74%  Trunk/Postural Assessment  Cervical Assessment Cervical Assessment: Exceptions to [facility] (cervical protraction and flexion) Thoracic Assessment Thoracic Assessment: Exceptions to [facility] (thoracic rounding) Lumbar Assessment Lumbar Assessment: Exceptions to [facility] (posterior pelvic tilt)  Balance Balance Balance Assessed: Yes Standardized Balance Assessment Standardized Balance Assessment: Berg Balance Test Berg Balance Test Sit to Stand: Needs moderate or maximal assist to stand Standing Unsupported: Unable to stand 30 seconds unassisted Sitting with Back Unsupported but Feet Supported on Floor or Stool: Able to sit 2 minutes under supervision Stand to Sit: Needs assistance to sit Transfers: Needs one person to assist Standing Unsupported with Eyes Closed: Needs help to keep from falling Standing Ubsupported with Feet Together: Needs help to attain position and unable to hold for 15 seconds From Standing, Reach Forward with Outstretched Arm: Loses balance while trying/requires external support From Standing Position, Pick up Object from Floor: Unable to try/needs assist to keep balance From Standing Position, Turn to Look Behind Over each Shoulder: Needs assist to keep from losing balance and falling Turn 360 Degrees: Needs assistance while turning Standing Unsupported, Alternately Place Feet on Step/Stool: Needs assistance to keep from falling or unable to try Standing Unsupported, One Foot in Front: Loses balance while stepping or standing Standing on One Leg: Unable to try or needs assist to prevent fall Total Score: 4 Static Sitting Balance Static Sitting - Balance Support: Feet supported Static Sitting - Level of Assistance: 5: Stand by assistance Dynamic Sitting Balance Dynamic Sitting - Balance Support: During functional activity Dynamic Sitting - Level of Assistance: 4: Min assist Static Standing Balance Static Standing - Balance Support: Right upper  extremity supported Static Standing - Level of Assistance: 3: Mod assist Dynamic Standing Balance Dynamic Standing - Balance Support: During functional activity Dynamic Standing - Level of Assistance: 2: Max assist Extremity/Trunk Assessment RUE Assessment RUE Assessment: Exceptions to [facility] Passive Range of Motion (PROM) Comments: WFLS for all joints Active Range of Motion (AROM) Comments: Brunnstrum stage II in the arm with trace shoulder movement noted and stage II in the hand. General Strength Comments: Max hand over hand assist  for integration into selfcare tasks at a stabilizer. LUE Assessment Active Range of Motion (AROM) Comments: WFLs for selfcare tasks, arthritic changes noted in digits.   , OTR/L 06/29/2021, 5:09 PM

## 2021-06-29 NOTE — Plan of Care (Signed)
  Problem: RH Memory Goal: LTG Patient will use memory compensatory aids to (SLP) Description: LTG:  Patient will use memory compensatory aids to recall biographical/new, daily complex information with cues (SLP) Outcome: Adequate for Discharge   Problem: RH Swallowing Goal: LTG Patient will consume least restrictive diet using compensatory strategies with assistance (SLP) Description: LTG:  Patient will consume least restrictive diet using compensatory strategies with assistance (SLP) Outcome: Completed/Met Goal: LTG Patient will participate in dysphagia therapy to increase swallow function with assistance (SLP) Description: LTG:  Patient will participate in dysphagia therapy to increase swallow function with assistance (SLP) Outcome: Completed/Met Goal: LTG Pt will demonstrate functional change in swallow as evidenced by bedside/clinical objective assessment (SLP) Description: LTG: Patient will demonstrate functional change in swallow as evidenced by bedside/clinical objective assessment (SLP) Outcome: Completed/Met   Problem: RH Expression Communication Goal: LTG Patient will increase speech intelligibility (SLP) Description: LTG: Patient will increase speech intelligibility at word/phrase/conversation level with cues, % of the time (SLP) Outcome: Completed/Met   Problem: RH Problem Solving Goal: LTG Patient will demonstrate problem solving for (SLP) Description: LTG:  Patient will demonstrate problem solving for basic/complex daily situations with cues  (SLP) Outcome: Completed/Met

## 2021-06-29 NOTE — Progress Notes (Signed)
Physical Therapy Session Note  Patient Details  Name: Vickie Mckee MRN: [id number] Date of Birth: 12-22-1930  Today's Date: 06/29/2021 PT Individual Time: 1009-1047 PT Individual Time Calculation (min): 38 min   Short Term Goals: Week 4:  PT Short Term Goal 1 (Week 4): = to LTGs based on ELOS  Skilled Therapeutic Interventions/Progress Updates:    Pt received supine in bed with lights off and reporting she is "sleepy" but also states needs to use bathroom. Pt agreeable to therapy session. Supine>sitting R EOB, HOB partially elevated and using bedrail, mod cuing for sequencing to increase pt independence with mod assist for L hemibody management and trunk upright. Sit>stand EOB>stedy with min assist for lifting to stand and managing L UE support. Stedy transfer in/out of bathroom. Sit<>stands to/from stedy seat with min assist - standing with min/mod assist of 1 while +2 provided total assist LB clothing management and peri-care. Pt noted to have started being incontinent of bowels in brief but able to finish voiding continently on BSC over toilet.   Sitting in w/c doffed UB clothing with dependent assist for buttons then mod assist to further remove shirt - max assist for donning UB clothing. Sit>stands w/c>R UE support on bedrail with light mod assist of 1 for lifting to stand, blocking/guarding L knee, and maintaining balance due to posterior lean. Standing with R UE support on bedrail and continued min/mod assist for balance while performing total assist LB clothing management to don clean pants. Sitting in w/c donned TED hose and shoes total assist for time management. At end of session pt left seated in w/c with needs in reach, seat belt alarm on, and L UE supported on lap tray with pt's word search in position.   Therapy Documentation Precautions:  Precautions Precautions: Fall Precaution Comments: L hemiparesis Restrictions Weight Bearing Restrictions: No   Pain:  No reports of pain  throughout session.    Therapy/Group: Individual Therapy  Tawana Scale , PT, DPT, NCS, CSRS 06/29/2021, 7:58 AM

## 2021-06-29 NOTE — Progress Notes (Signed)
Physical Therapy Discharge Summary  Patient Details  Name: Vickie Mckee MRN: [id number] Date of Birth: 05/05/1931  Today's Date: 06/29/2021 PT Individual Time: 2585-2778 PT Individual Time Calculation (min): 53 min    Patient has met 8 of 9 long term goals due to improved activity tolerance, improved balance, improved postural control, increased strength, ability to compensate for deficits, functional use of  left upper extremity and left lower extremity, improved attention, and improved awareness.  Patient to discharge at a wheelchair level requiring Mod Assist for functional transfers.  Patient's care partner unavailable to provide the necessary physical and cognitive assistance at discharge.  Reasons goals not met: Pt continues to require +2 assist for safety while ambulating.  Recommendation:  Patient will benefit from ongoing skilled PT services in skilled nursing facility setting to continue to advance safe functional mobility, address ongoing impairments in L hemibody strength, dynamic standing balance, trunk control, gait training, cardiopulmonary endurance, and minimize fall risk.  Equipment: No equipment provided - equipment to be provided by next facility  Reasons for discharge: treatment goals met and discharge from hospital  Patient/family agrees with progress made and goals achieved: Yes  Skilled Therapeutic Interventions/Progress Updates:  Pt received sitting in w/c with her 2 daughters present. Pt agreeable to therapy session. Family in agreement with recommendation for medical transport to SNF tomorrow as pt cannot safely perform car transfer at this time. Therapist provided patient with the below HEP and reviewed exercises with pt's family - provided level 1 theraband to utilize with resistance exercises. Pt performed R LE w/c propulsion 151f with supervision and verbal cuing for large obstacle navigation. At end of session pt left seated in w/c with family present.    Access Code: [email or web address] URL: [email or web address] Date: 06/29/2021 Prepared by: CPage Spiro Exercises Supine Bridge - 1 x daily - 7 x weekly - 2 sets - 20 reps Supine Heel Slide - 1 x daily - 7 x weekly - 2 sets - 20 reps Supine Hip Adduction Isometric with Ball - 1 x daily - 7 x weekly - 2 sets - 20 reps - 5 seconds hold Hooklying Isometric Clamshell - 1 x daily - 7 x weekly - 2 sets - 20 reps Supine Ankle Dorsiflexion Stretch with Caregiver - 1 x daily - 7 x weekly - 2 sets - 1 minute hold Seated Long Arc Quad - 1 x daily - 7 x weekly - 2 sets - 20 reps Seated Hip Adduction Isometrics with Ball - 1 x daily - 7 x weekly - 2 sets - 20 reps - 5 seconds hold Seated Hip Abduction with Resistance - 1 x daily - 7 x weekly - 2 sets - 20 reps Seated March - 1 x daily - 7 x weekly - 2 sets - 20 reps Seated Heel Raise - 1 x daily - 7 x weekly - 2 sets - 20 reps   PT Discharge Precautions/Restrictions Precautions Precautions: Fall;Other (comment) Precaution Comments: L hemiparesis (UE>LE) Restrictions Weight Bearing Restrictions: No Pain Pain Assessment Pain Scale: 0-10 Pain Score: 0-No pain Pain Interference Pain Interference Pain Effect on Sleep: 0. Does not apply - I have not had any pain or hurting in the past 5 days Pain Interference with Therapy Activities: 1. Rarely or not at all Pain Interference with Day-to-Day Activities: 1. Rarely or not at all Perception  Perception Perception: Within Functional Limits Praxis Praxis: Intact  Cognition Overall Cognitive Status: Impaired/Different from baseline Arousal/Alertness: Awake/alert Orientation Level: Oriented to person;Oriented to  place;Disoriented to time;Oriented to time;Disoriented to situation (oriented to year but not month or day of week) Year: 2022 Month: May Day of Week: Incorrect (Monday) Attention: Focused;Sustained Focused Attention: Appears intact Sustained Attention: Impaired Memory:  Impaired Awareness: Impaired Problem Solving: Impaired Behaviors: Perseveration Safety/Judgment: Impaired Sensation  Sensation Light Touch: Appears Intact Hot/Cold: Not tested Proprioception: Appears Intact Stereognosis: Not tested Coordination Gross Motor Movements are Fluid and Coordinated: No Fine Motor Movements are Fluid and Coordinated: No Coordination and Movement Description: Continued L hemiparesis with greater deficit in UE compared to LE. Only trace movement in the left arm and hand requiring max hand over hand assist needed for integration into selfcare tasks. Movement against gravity in L LE. Motor  Motor Motor: Abnormal postural alignment and control;Hemiplegia Motor - Discharge Observations: Still with postural alignment issues and LUE > LLE hemiparesis  Mobility Bed Mobility Bed Mobility: Sit to Supine;Supine to Sit Rolling Right: Moderate Assistance - Patient 50-74% Rolling Left: Contact Guard/Touching assist (using bedrail) Supine to Sit: Moderate Assistance - Patient 50-74% Sit to Supine: Moderate Assistance - Patient 50-74% Transfers Transfers: Sit to Stand;Stand to Sit;Squat Pivot Transfers Sit to Stand: Moderate Assistance - Patient 50-74% Stand to Sit: Moderate Assistance - Patient 50-74% Squat Pivot Transfers: Moderate Assistance - Patient 50-74% Transfer (Assistive device): 1 person hand held assist (RUE support) Locomotion  Gait Ambulation: Yes Gait Assistance: 2 Helpers (mod A of 1 and +2 HHA) Gait Distance (Feet): 70 Feet Assistive device: 1 person hand held assist Gait Assistance Details: Verbal cues for gait pattern;Tactile cues for initiation;Tactile cues for sequencing;Tactile cues for weight shifting;Manual facilitation for weight shifting;Manual facilitation for weight bearing;Manual facilitation for placement;Verbal cues for technique;Verbal cues for sequencing;Tactile cues for posture;Tactile cues for weight beaing;Tactile cues for  placement;Visual cues/gestures for sequencing Gait Gait: Yes Gait Pattern: Impaired Gait Pattern: Decreased stance time - left;Decreased hip/knee flexion - left;Poor foot clearance - left;Lateral trunk lean to left Gait velocity: decreased Stairs / Additional Locomotion Stairs: No Pick up small object from the floor assist level: Total Assistance - Patient < 25% Wheelchair Mobility Wheelchair Mobility: Yes Wheelchair Assistance: Set up assist Wheelchair Propulsion: Right lower extremity Wheelchair Parts Management: Needs assistance Distance: 140f  Trunk/Postural Assessment  Cervical Assessment Cervical Assessment: Exceptions to [facility](mild forward head) Thoracic Assessment Thoracic Assessment: Exceptions to [facility](thoracic rounding) Lumbar Assessment Lumbar Assessment: Exceptions to [facility](posterior pelvic tilt) Postural Control Postural Control: Deficits on evaluation  Balance Balance Balance Assessed: Yes Standardized Balance Assessment Standardized Balance Assessment: Berg Balance Test Berg Balance Test Sit to Stand: Needs moderate or maximal assist to stand Standing Unsupported: Unable to stand 30 seconds unassisted Sitting with Back Unsupported but Feet Supported on Floor or Stool: Able to sit 2 minutes under supervision Stand to Sit: Needs assistance to sit Transfers: Needs one person to assist Standing Unsupported with Eyes Closed: Needs help to keep from falling Standing Ubsupported with Feet Together: Needs help to attain position and unable to hold for 15 seconds From Standing, Reach Forward with Outstretched Arm: Loses balance while trying/requires external support From Standing Position, Pick up Object from Floor: Unable to try/needs assist to keep balance From Standing Position, Turn to Look Behind Over each Shoulder: Needs assist to keep from losing balance and falling Turn 360 Degrees: Needs assistance while turning Standing Unsupported, Alternately Place Feet on  Step/Stool: Needs assistance to keep from falling or unable to try Standing Unsupported, One Foot in Front: Loses balance while stepping or standing Standing on One Leg:  Unable to try or needs assist to prevent fall Total Score: 4 Static Sitting Balance Static Sitting - Balance Support: Feet supported Static Sitting - Level of Assistance: 5: Stand by assistance Dynamic Sitting Balance Dynamic Sitting - Balance Support: During functional activity Dynamic Sitting - Level of Assistance: 4: Min assist Static Standing Balance Static Standing - Balance Support: Right upper extremity supported Static Standing - Level of Assistance: 3: Mod assist Dynamic Standing Balance Dynamic Standing - Balance Support: During functional activity Dynamic Standing - Level of Assistance: Other (comment) (+2 mod A) Extremity Assessment      RLE Assessment RLE Assessment: Within Functional Limits Active Range of Motion (AROM) Comments: WFL General Strength Comments: grossly 4+/5 to 5/5 assessed functionally LLE Assessment LLE Assessment: Exceptions to [facility] Passive Range of Motion (PROM) Comments: WFL LLE Strength LLE Overall Strength: Deficits Left Hip Flexion: 3/5 Left Hip Extension: 3/5 Left Hip ABduction: 2+/5 Left Hip ADduction: 2+/5 Left Knee Flexion: 2+/5 Left Knee Extension: 2+/5 Left Ankle Dorsiflexion: 2-/5 Left Ankle Plantar Flexion: 2-/5    Tawana Scale , PT, DPT, NCS, CSRS 06/29/2021, 7:59 AM

## 2021-06-29 NOTE — Progress Notes (Signed)
Speech Language Pathology Discharge Summary  Patient Details  Name: Vickie Mckee MRN: [id number] Date of Birth: [birth year]  Today's Date: 06/29/2021 SLP Individual Time: 1100-1200 SLP Individual Time Calculation (min): 60 min  Skilled Therapeutic Interventions: Patient agreeable to skilled ST intervention with focus on cognitive goals. Patient was emotional today as she prepares for discharge to SNF tomorrow. She expressed fears for a new environment and  that staff may not be as kind as current setting. SLP provided emotional support and words of encouragement. SLP facilitated orientation with sup A verbal cues to use external aids to orient to time and place. Without external aids, patient was not oriented to time (day of week, month, yr) or place. She was aware of general location ("hospital") and situation ("I had a stroke") for why she is in the hospital, but not consistently oriented to moment-to-moment situations (e.g., at times thought she was discharging today despite previously discussing d/c date as tomorrow). Facilitated functional organization task involving common items found with home and determining where they belong. Patient completed task with sup-to-min A verbal cues for sustained attention and alternating attention between two topics. Completed education with patient's daughters including orientation strategies with transition to SNF, and continuation of Dys 3 textures, thin liquids, and meds whole in puree. All questions addressed.  Patient was left in wheelchair with alarm activated and immediate needs within reach at end of session.   Patient has met 5 of 6 long term goals.  Patient to discharge at overall Min level for basic cognition in a structured environment, max A for short-term recall. Cognitive status fluctuates daily.  Reasons goals not met: memory goal not achieved; max A at discharge   Clinical Impression/Discharge Summary:  Patient has made functional gains and has  met 5 of 6 long-term goals this admission due to improved oropharyngeal swallow function, speech intelligibility, sustained attention, basic problem solving, and use of external orientation and memory aids. Patient is currently an overall min A for basic cognitive tasks in a structured environment, however requires increased support in unstructured environments, and max A for short-term recall and awareness. Patient's cognitive status fluctuates daily as evidenced by intermittent confusion, disorientation, and emotional lability. Patient is currently tolerating Dys 3 textures and thin liquids with medications whole in puree. She continues to require sup A during meals for implementation of safe swallow precautions and strategies including slow rate, small bites, and assessing for left buccal pocketing to minimize risk for aspiration. Patient and family education is complete and patient to discharge to SNF. Patient would benefit from continued SLP services to maximize cognitive, swallow, and speech function and functional independence.    Care Partner:  Caregiver Able to Provide Assistance: Yes (SNF)  Type of Caregiver Assistance: Cognitive;Physical (SNF)  Recommendation:  Home Health SLP;24 hour supervision/assistance;Skilled Nursing facility  Rationale for SLP Follow Up: Maximize cognitive function and independence;Maximize swallowing safety;Maximize functional communication   Equipment: NA   Reasons for discharge: Discharged from hospital;Treatment goals met   Patient/Family Agrees with Progress Made and Goals Achieved: Yes    Ambrose Wile T Connar Keating 06/29/2021, 3:59 PM

## 2021-06-29 NOTE — Progress Notes (Signed)
Patient ID: Vickie Mckee, female   DOB: 14-May-1931, 87 y.o.   MRN: [id number]  Pt transport scheduled for 10:30 AM for D/C to SNF on tomorrow  Erlene Quan, [place]

## 2021-06-30 ENCOUNTER — Inpatient Hospital Stay
Admission: RE | Admit: 2021-06-30 | Discharge: 2022-11-12 | Disposition: A | Discharge status: Skilled Nursing Facility | Payer: Medicare Other | Source: Ambulatory Visit | Attending: Internal Medicine | Admitting: Internal Medicine

## 2021-06-30 DIAGNOSIS — R41841 Cognitive communication deficit: Secondary | ICD-10-CM | POA: Diagnosis not present

## 2021-06-30 DIAGNOSIS — F0390 Unspecified dementia without behavioral disturbance: Secondary | ICD-10-CM | POA: Diagnosis not present

## 2021-06-30 DIAGNOSIS — R040 Epistaxis: Secondary | ICD-10-CM | POA: Diagnosis not present

## 2021-06-30 DIAGNOSIS — I69391 Dysphagia following cerebral infarction: Secondary | ICD-10-CM | POA: Diagnosis not present

## 2021-06-30 DIAGNOSIS — I639 Cerebral infarction, unspecified: Secondary | ICD-10-CM | POA: Diagnosis not present

## 2021-06-30 DIAGNOSIS — M6281 Muscle weakness (generalized): Secondary | ICD-10-CM | POA: Diagnosis not present

## 2021-06-30 DIAGNOSIS — Z7401 Bed confinement status: Secondary | ICD-10-CM | POA: Diagnosis not present

## 2021-06-30 DIAGNOSIS — E039 Hypothyroidism, unspecified: Secondary | ICD-10-CM | POA: Diagnosis not present

## 2021-06-30 DIAGNOSIS — R770 Abnormality of albumin: Secondary | ICD-10-CM | POA: Diagnosis not present

## 2021-06-30 DIAGNOSIS — E782 Mixed hyperlipidemia: Secondary | ICD-10-CM | POA: Diagnosis not present

## 2021-06-30 DIAGNOSIS — E46 Unspecified protein-calorie malnutrition: Secondary | ICD-10-CM | POA: Diagnosis not present

## 2021-06-30 DIAGNOSIS — Z741 Need for assistance with personal care: Secondary | ICD-10-CM | POA: Diagnosis not present

## 2021-06-30 DIAGNOSIS — Q282 Arteriovenous malformation of cerebral vessels: Secondary | ICD-10-CM | POA: Diagnosis not present

## 2021-06-30 DIAGNOSIS — F339 Major depressive disorder, recurrent, unspecified: Secondary | ICD-10-CM | POA: Diagnosis not present

## 2021-06-30 DIAGNOSIS — E785 Hyperlipidemia, unspecified: Secondary | ICD-10-CM | POA: Diagnosis not present

## 2021-06-30 DIAGNOSIS — E559 Vitamin D deficiency, unspecified: Secondary | ICD-10-CM | POA: Diagnosis not present

## 2021-06-30 DIAGNOSIS — K219 Gastro-esophageal reflux disease without esophagitis: Secondary | ICD-10-CM | POA: Diagnosis not present

## 2021-06-30 DIAGNOSIS — E876 Hypokalemia: Secondary | ICD-10-CM | POA: Diagnosis not present

## 2021-06-30 DIAGNOSIS — N318 Other neuromuscular dysfunction of bladder: Secondary | ICD-10-CM | POA: Diagnosis not present

## 2021-06-30 DIAGNOSIS — M545 Low back pain, unspecified: Secondary | ICD-10-CM | POA: Diagnosis not present

## 2021-06-30 DIAGNOSIS — N1831 Chronic kidney disease, stage 3a: Secondary | ICD-10-CM | POA: Diagnosis not present

## 2021-06-30 DIAGNOSIS — I69322 Dysarthria following cerebral infarction: Secondary | ICD-10-CM | POA: Diagnosis not present

## 2021-06-30 DIAGNOSIS — F39 Unspecified mood [affective] disorder: Secondary | ICD-10-CM | POA: Diagnosis not present

## 2021-06-30 DIAGNOSIS — F0150 Vascular dementia without behavioral disturbance: Secondary | ICD-10-CM | POA: Diagnosis not present

## 2021-06-30 DIAGNOSIS — G459 Transient cerebral ischemic attack, unspecified: Secondary | ICD-10-CM | POA: Diagnosis not present

## 2021-06-30 DIAGNOSIS — H9190 Unspecified hearing loss, unspecified ear: Secondary | ICD-10-CM | POA: Diagnosis not present

## 2021-06-30 DIAGNOSIS — R4189 Other symptoms and signs involving cognitive functions and awareness: Secondary | ICD-10-CM | POA: Diagnosis not present

## 2021-06-30 DIAGNOSIS — G8194 Hemiplegia, unspecified affecting left nondominant side: Secondary | ICD-10-CM | POA: Diagnosis not present

## 2021-06-30 DIAGNOSIS — R059 Cough, unspecified: Secondary | ICD-10-CM | POA: Diagnosis not present

## 2021-06-30 DIAGNOSIS — R29818 Other symptoms and signs involving the nervous system: Secondary | ICD-10-CM | POA: Diagnosis not present

## 2021-06-30 DIAGNOSIS — R091 Pleurisy: Secondary | ICD-10-CM | POA: Diagnosis not present

## 2021-06-30 DIAGNOSIS — R531 Weakness: Secondary | ICD-10-CM | POA: Diagnosis not present

## 2021-06-30 DIAGNOSIS — I69354 Hemiplegia and hemiparesis following cerebral infarction affecting left non-dominant side: Secondary | ICD-10-CM | POA: Diagnosis not present

## 2021-06-30 DIAGNOSIS — F32A Depression, unspecified: Secondary | ICD-10-CM | POA: Diagnosis not present

## 2021-06-30 DIAGNOSIS — E890 Postprocedural hypothyroidism: Secondary | ICD-10-CM | POA: Diagnosis not present

## 2021-06-30 DIAGNOSIS — I10 Essential (primary) hypertension: Secondary | ICD-10-CM | POA: Diagnosis not present

## 2021-06-30 DIAGNOSIS — I63211 Cerebral infarction due to unspecified occlusion or stenosis of right vertebral arteries: Secondary | ICD-10-CM | POA: Diagnosis not present

## 2021-06-30 DIAGNOSIS — E8809 Other disorders of plasma-protein metabolism, not elsewhere classified: Secondary | ICD-10-CM | POA: Diagnosis not present

## 2021-06-30 DIAGNOSIS — J342 Deviated nasal septum: Secondary | ICD-10-CM | POA: Diagnosis not present

## 2021-06-30 DIAGNOSIS — I421 Obstructive hypertrophic cardiomyopathy: Secondary | ICD-10-CM | POA: Diagnosis not present

## 2021-06-30 DIAGNOSIS — R262 Difficulty in walking, not elsewhere classified: Secondary | ICD-10-CM | POA: Diagnosis not present

## 2021-06-30 DIAGNOSIS — Z23 Encounter for immunization: Secondary | ICD-10-CM | POA: Diagnosis not present

## 2021-06-30 DIAGNOSIS — Z0000 Encounter for general adult medical examination without abnormal findings: Secondary | ICD-10-CM | POA: Diagnosis not present

## 2021-06-30 DIAGNOSIS — I700 Atherosclerosis of aorta: Secondary | ICD-10-CM | POA: Diagnosis not present

## 2021-06-30 DIAGNOSIS — I6350 Cerebral infarction due to unspecified occlusion or stenosis of unspecified cerebral artery: Secondary | ICD-10-CM | POA: Diagnosis not present

## 2021-06-30 NOTE — Progress Notes (Signed)
Inpatient Rehabilitation Care Coordinator Discharge Note   Patient Details  Name: Vickie Mckee MRN: [id number] Date of Birth: Nov 01, 1930   Discharge location: D/c to SNF [facility])  Length of Stay: 29 Days  Discharge activity level: Mod/Max  Home/community participation: SNF  Patient response [id number] Literacy - How often do you need to have someone help you when you read instructions, pamphlets, or other written material from your doctor or pharmacy?: Patient unable to respond  Patient response [id number] Isolation - How often do you feel lonely or isolated from those around you?: Patient unable to respond  Services provided included: MD, RD, PT, OT, SLP, RN, CM, TR, Pharmacy, Neuropsych, SW  Financial Services:  Financial Services Utilized: Medicare    Choices offered to/list presented to: pt daughters  Follow-up services arranged:  Other (Comment)           Patient response to transportation need: Is the patient able to respond to transportation needs?: No        Comments (or additional information):  Patient/Family verbalized understanding of follow-up arrangements:  Yes  Individual responsible for coordination of the follow-up plan: Butch Penny [phone]  Confirmed correct DME delivered: [name] 06/30/2021    [name]

## 2021-06-30 NOTE — Progress Notes (Signed)
Patient ID: Vickie Mckee, female   DOB: [birth year], 87 y.o.   MRN: [id number]  Pt d/c packet left at nursing station. Number for report : [place], [place]

## 2021-06-30 NOTE — Progress Notes (Signed)
PROGRESS NOTE   Subjective/Complaints: Pleasant Ready for d/c today Has no questions or concerns Very thankful for her care here  ROS: +fatigue, +left sided weakness, denies pain  Objective:   No results found. No results for input(s): WBC, HGB, HCT, PLT in the last 72 hours.  No results for input(s): NA, K, CL, CO2, GLUCOSE, BUN, CREATININE, CALCIUM in the last 72 hours.  Intake/Output Summary (Last 24 hours) at 06/30/2021 0947 Last data filed at 06/30/2021 0821 Gross per 24 hour  Intake 440 ml  Output --  Net 440 ml        Physical Exam: Vital Signs Blood pressure 133/61, pulse 69, temperature 97.7 F (36.5 C), temperature source Oral, resp. rate 14, height '5\' 2"'$  (1.575 m), weight 58.3 kg, SpO2 96 %. Gen: no distress, normal appearing HEENT: oral mucosa pink and moist, NCAT Cardio: Reg rate Chest: normal effort, normal rate of breathing Abd: soft, non-distended Ext: no edema Psych: pleasant, normal affect Skin: intact  Motor: trace triceps  on left , 3- hip knee ext syndergy on left o/w 0/5 No increase in tone noted  Assessment/Plan: 1. Functional deficits which require 3+ hours per day of interdisciplinary therapy in a comprehensive inpatient rehab setting. Physiatrist is providing close team supervision and 24 hour management of active medical problems listed below. Physiatrist and rehab team continue to assess barriers to discharge/monitor patient progress toward functional and medical goals  Care Tool:  Bathing    Body parts bathed by patient: Left arm, Right upper leg, Left upper leg, Abdomen, Chest, Face, Front perineal area, Right lower leg   Body parts bathed by helper: Right arm, Buttocks, Left lower leg     Bathing assist Assist Level: Maximal Assistance - Patient 24 - 49%     Upper Body Dressing/Undressing Upper body dressing   What is the patient wearing?: Pull over shirt    Upper body  assist Assist Level: Moderate Assistance - Patient 50 - 74%    Lower Body Dressing/Undressing Lower body dressing      What is the patient wearing?: Incontinence brief, Pants     Lower body assist Assist for lower body dressing: Maximal Assistance - Patient 25 - 49%     Toileting Toileting    Toileting assist Assist for toileting: Maximal Assistance - Patient 25 - 49%     Transfers Chair/bed transfer  Transfers assist     Chair/bed transfer assist level: Moderate Assistance - Patient 50 - 74% (squat pivot)     Locomotion Ambulation   Ambulation assist   Ambulation activity did not occur: Safety/medical concerns  Assist level: 2 helpers (+2 mod A) Assistive device: Hand held assist Max distance: 53f   Walk 10 feet activity   Assist  Walk 10 feet activity did not occur: Safety/medical concerns  Assist level: 2 helpers (+2 mod A) Assistive device: Hand held assist   Walk 50 feet activity   Assist Walk 50 feet with 2 turns activity did not occur: Safety/medical concerns (did not perform turn)         Walk 150 feet activity   Assist Walk 150 feet activity did not occur: Safety/medical concerns  Walk 10 feet on uneven surface  activity   Assist Walk 10 feet on uneven surfaces activity did not occur: Safety/medical concerns         Wheelchair     Assist Is the patient using a wheelchair?: Yes Type of Wheelchair: Manual Wheelchair activity did not occur: Safety/medical concerns  Wheelchair assist level: Set up assist, Supervision/Verbal cueing Max wheelchair distance: 16f    Wheelchair 50 feet with 2 turns activity    Assist    Wheelchair 50 feet with 2 turns activity did not occur: Safety/medical concerns   Assist Level: Supervision/Verbal cueing   Wheelchair 150 feet activity     Assist  Wheelchair 150 feet activity did not occur: Safety/medical concerns   Assist Level: Supervision/Verbal cueing    Blood pressure 133/61, pulse 69, temperature 97.7 F (36.5 C), temperature source Oral, resp. rate 14, height '5\' 2"'$  (1.575 m), weight 58.3 kg, SpO2 96 %.      Medical Problem List and Plan: 1.   Left-sided hemiplegia with dysarthria/dysphagia secondary to right pontine infarction as well as multiple small remote bilateral cerebellar lacunar infarcts D/c today Cont PRAFO, WHO nightly 2.  Impaired mobility: discontinue Lovenox for DVT prophylaxis since ambulating >150 feet             -antiplatelet therapy: Continue Aspirin 81 mg daily and Plavix 75 mg day x1 month then Plavix alone D/c ASA 9/7 3. Pain Management/chronic back pain: Continue Tylenol as needed  Controlled on 9/9 4. Depression: Continue Zoloft 100 mg daily, Aricept 5 mg nightly             -antipsychotic agents: Seroquel 12.5 mg q evening.  5. Neuropsych: This patient is NOT capable of making decisions on her own behalf. 6. Skin/Wound Care: Routine skin checks 7. Fluids/Electrolytes/Nutrition: Routine in and outs 8. Post stroke dysphagia.   Continue D2 thins  Advance diet as tolerated 9.  Hypertension.  Patient on verapamil 240 mg daily prior to admission as well as HCTZ 25 mg daily.     Vitals:   06/29/21 2006 06/30/21 0424  BP: 140/68 133/61  Pulse: 85 69  Resp: 16 14  Temp: 98.6 F (37 C) 97.7 F (36.5 C)  SpO2: 99% 96%   Normotensive 9/9 10.  Hyperlipidemia.  Lipitor 11.  Hypothyroidism.  TSH 2.093 .Continue Synthroid 12.  Constipation.  MiraLAX scheduled daily.  Senokot S2 tablets nightly  13. Hypokalemia: Supplementation started   Potassium 3.7 on 8/31,  3.9 on 9/5 labs stable 14. Crackles and coughing with food intake: placed nursing order for must be up with meals, resolved  15.  Hypoalbuminemia  Supplement initiated on 8/13 16.  Cognitive deficits post stroke - has evidence of small vessel disease on imaging as well as the new pontine infarct   Has fluctuation of mental status as expected UA neg, no  change in motor cont to monitor   18. Sedation with agitation  8/26- will stop melatonin- if this doesn't help, might benefit from amantadine.    Amantadine 100 daily increase to twice daily on 8/31. - no clear cut benefit noted will discuss with team today    Moved up seroquel to 2000- per pt sleeping well    >30 minutes spent in discharge of patient including review of medications and follow-up appointments, physical examination, and in answering all patient's questions   LOS: 29 days [place]/06/2021, 9:47 AM

## 2021-07-03 ENCOUNTER — Encounter: Payer: Self-pay | Admitting: Adult Health

## 2021-07-03 ENCOUNTER — Non-Acute Institutional Stay (SKILLED_NURSING_FACILITY): Payer: Medicare Other | Admitting: Adult Health

## 2021-07-03 DIAGNOSIS — E782 Mixed hyperlipidemia: Secondary | ICD-10-CM | POA: Diagnosis not present

## 2021-07-03 DIAGNOSIS — F0150 Vascular dementia without behavioral disturbance: Secondary | ICD-10-CM

## 2021-07-03 DIAGNOSIS — I69391 Dysphagia following cerebral infarction: Secondary | ICD-10-CM | POA: Diagnosis not present

## 2021-07-03 DIAGNOSIS — N1831 Chronic kidney disease, stage 3a: Secondary | ICD-10-CM

## 2021-07-03 DIAGNOSIS — E890 Postprocedural hypothyroidism: Secondary | ICD-10-CM

## 2021-07-03 DIAGNOSIS — I639 Cerebral infarction, unspecified: Secondary | ICD-10-CM | POA: Diagnosis not present

## 2021-07-03 DIAGNOSIS — E46 Unspecified protein-calorie malnutrition: Secondary | ICD-10-CM

## 2021-07-03 DIAGNOSIS — F0390 Unspecified dementia without behavioral disturbance: Secondary | ICD-10-CM | POA: Diagnosis not present

## 2021-07-03 DIAGNOSIS — F339 Major depressive disorder, recurrent, unspecified: Secondary | ICD-10-CM

## 2021-07-03 DIAGNOSIS — K5909 Other constipation: Secondary | ICD-10-CM

## 2021-07-03 DIAGNOSIS — R400 Somnolence: Secondary | ICD-10-CM

## 2021-07-03 DIAGNOSIS — E8809 Other disorders of plasma-protein metabolism, not elsewhere classified: Secondary | ICD-10-CM | POA: Diagnosis not present

## 2021-07-03 DIAGNOSIS — I700 Atherosclerosis of aorta: Secondary | ICD-10-CM

## 2021-07-03 DIAGNOSIS — G8194 Hemiplegia, unspecified affecting left nondominant side: Secondary | ICD-10-CM

## 2021-07-03 DIAGNOSIS — I421 Obstructive hypertrophic cardiomyopathy: Secondary | ICD-10-CM

## 2021-07-03 DIAGNOSIS — K219 Gastro-esophageal reflux disease without esophagitis: Secondary | ICD-10-CM | POA: Diagnosis not present

## 2021-07-03 NOTE — Progress Notes (Signed)
Location:  [facility] Room Number: 129-P Place of Service:  SNF (31)   CODE STATUS: DNR  Allergies  Allergen Reactions   Codeine Nausea Only    Chief Complaint  Patient presents with   Hospitalization Follow-up    HPI:  She is a 87 year old woman who has been hospitalized from 06-01-21 through 06-30-21. Her medical history includes: hypertension; hypothyroidism; hypertrophic obstructive cardiomyopathy; and memory loss. She had been living independent and used an assistive device at home. On 05-29-21 she had an acute onset of left side weakness; dysarthria and left lateral gaze. She was treated for right cva with left hemiplegia. She was transferred to inpatient rehab. She was given all three branches of therapy 5 days per week. She was maintained on plavix for one month and remains on asa. She is here to continue her rehab. Her daughters are looking to take her home; when she is able. She tells me that her left arm does not work and has little movement in her left leg. She does not know where are when she is. She denies any uncontrolled pain. She will continue to be followed for her chronic illnesses including: Somnolence:  HOCM (hypertrophic obstructive cardiomyopathy)  Dysphagia post stroke:  Stage 3a chronic kidney disease:  Vascular dementia without behavioral disturbance   Past Medical History:  Diagnosis Date   Decreased sense of smell    and taste-negative CT scan-2011   Depression    Essential hypertension    GERD (gastroesophageal reflux disease)    Heart murmur    heard first time 4/12   History of mammogram    2010 and she does not want to repeat them anymore   HOCM (hypertrophic obstructive cardiomyopathy) ([place])    a. Dx by echo 05/2014.   Hypothyroidism    IBS (irritable bowel syndrome)    Impaired fasting glucose    Low back pain    Lumbar radiculopathy    with negative MRI (1991)   Postmenopausal    with osteopenia, bisphosphonates 1/05-1/11,  improved osteopenia 4/12-repeat planned late 2015   Shingles    2015   Thyroid nodule    Vitamin D deficiency    repleted on weekly vitamin D 50000 iu    Past Surgical History:  Procedure Laterality Date   ABDOMINAL HYSTERECTOMY     BREAST BIOPSY     x2   CATARACT EXTRACTION     removal with IOL bilateral   GLAUCOMA SURGERY     laser   skin cancer removal     THYROIDECTOMY     subtotal    Social History   Socioeconomic History   Marital status: Married    Spouse name: Not on file   Number of children: Not on file   Years of education: Not on file   Highest education level: Not on file  Occupational History   Not on file  Tobacco Use   Smoking status: Never   Smokeless tobacco: Never  Vaping Use   Vaping Use: Never used  Substance and Sexual Activity   Alcohol use: No    Alcohol/week: 0.0 standard drinks   Drug use: Never   Sexual activity: Not on file  Other Topics Concern   Not on file  Social History Narrative   Not on file   Social Determinants of Health   Financial Resource Strain: Not on file  Food Insecurity: Not on file  Transportation Needs: Not on file  Physical Activity: Not  on file  Stress: Not on file  Social Connections: Not on file  Intimate Partner Violence: Not on file   Family History  Problem Relation Age of Onset   Breast cancer Mother    Hypertension Father    CVA Father    Heart failure Sister    CAD Sister    Kidney disease Sister    COPD Brother    Lung cancer Brother    CAD Brother    Diabetes Brother    Stroke Father    Heart attack Neg Hx       VITAL SIGNS BP 126/62   Pulse 76   Temp 97.8 F (36.6 C)   Resp 18   Ht '5\' 2"'$  (1.575 m)   SpO2 97%   BMI 23.51 kg/m   Outpatient Encounter Medications as of 07/03/2021  Medication Sig   acetaminophen (TYLENOL) 325 MG tablet Take 2 tablets (650 mg total) by mouth every 4 (four) hours as needed for mild pain (or temp > 37.5 C (99.5 F)).   amantadine (SYMMETREL) 50  MG/5ML solution Take 100 mg by mouth 2 (two) times daily. With breakfast and lunch   aspirin EC 81 MG EC tablet Take 1 tablet (81 mg total) by mouth daily with breakfast. Swallow whole.   atorvastatin (LIPITOR) 40 MG tablet Take 1 tablet (40 mg total) by mouth daily.   Cholecalciferol (VITAMIN D-3) 25 MCG (1000 UT) CAPS Take 1 capsule by mouth daily.   donepezil (ARICEPT) 5 MG tablet Take 1 tablet (5 mg total) by mouth at bedtime.   famotidine (PEPCID) 20 MG tablet Take 1 tablet (20 mg total) by mouth daily.   levothyroxine (SYNTHROID, LEVOTHROID) 75 MCG tablet Take 75 mcg by mouth daily before breakfast.    NON FORMULARY Diet: Dysphagia 3 diet with nectar thick liquids   polyethylene glycol (MIRALAX / GLYCOLAX) 17 g packet Take 17 g by mouth daily.   pyridOXINE (VITAMIN B-6) 25 MG tablet Take 25 mg by mouth daily.   QUEtiapine (SEROQUEL) 25 MG tablet Take 0.5 tablets (12.5 mg total) by mouth daily after supper.   sertraline (ZOLOFT) 100 MG tablet Take 1 tablet (100 mg total) by mouth daily.   vitamin C (ASCORBIC ACID) 500 MG tablet Take 500 mg by mouth daily.   zinc sulfate 220 (50 Zn) MG capsule Take 1 capsule (220 mg total) by mouth daily.   No facility-administered encounter medications on file as of 07/03/2021.     SIGNIFICANT DIAGNOSTIC EXAMS  TODAY  05-29-21: ct of head code stroke:  No evidence of acute intracranial abnormality. ASPECTS is 10. Mild generalized cerebral atrophy. Paranasal sinus disease at the imaged levels, as described.  05-29-21: ct angio of head and neck:  CTA neck: 1. The common carotid and internal carotid arteries are patent within the neck without stenosis. Mild atherosclerotic plaque within both carotid systems within the neck, as described. 2. Vertebral arteries patent within the neck. Mild-to-moderate stenosis within the distal V2 segment of the non-dominant left vertebral artery. 3. Mild nonspecific fusiform dilation of the distal cervical left ICA to 7  mm. CTA head: 1. No intracranial large vessel occlusion or proximal high-grade arterial stenosis identified. 2. Intracranial atherosclerotic disease, as described. 3. 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 At least one branch vessel arises from this aneurysm. 4. 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  05-29-21: MRI of brain:  1. Acute right pontine infarct.  Mild associated edema without mass effect. 2. Multiple small remote bilateral cerebellar lacunar infarcts. 3. Mild for age chronic microvascular ischemic disease and moderate atrophy.  05-29-21: 2-d echo:  1. No SAM noted mid/apical cavity obliteration in systole with some flow  acceleration peak velocity in 86msec range . Left ventricular ejection  fraction, by estimation, is 60 to 65%. The left ventricle has normal  function. The left ventricle has no  regional wall motion abnormalities. There is severe left ventricular  hypertrophy. Left ventricular diastolic parameters are consistent with  Grade I diastolic dysfunction (impaired relaxation).   05-30-21: EEG This study is suggestive of mild diffuse encephalopathy, nonspecific etiology. No seizures or epileptiform discharges were seen throughout the recording.   LABS REVIEWED TODAY;   05-29-21: wbc 21.3; hgb 15.6; hct 45.5; mcv 87.2 plt 317; glucose 137; bun 18; creat 0.90; k+ 2.6; na++ 136; ca 8.9; GFR>60; liver normal albumin 4.2; mag 1.8; tsh 2.093; vit B 12: 584 vit D 36.20 05-30-21: hgb a1c 5.7; chol 203; ldl 127; trig 196 hdl 37 06-16-21: wbc 10.7; hb 13.4; hct 40.3; mcv 90.6 plt 250; glucose 105; bun 33; creat 1.03; k+ 4.2; na++ 137; ca 9.1; GFR 53 06-26-21: wbc 7.5; hgb 12.7; hct 38.2; mcv 90.7 plt 22; glucose 112; bun 29; creat 1.07; k+ 3.9; na++ 139; ca 9,4; GFR 50  06-27-21: urine culture: 20,000 proteus mirabilis    Review of Systems  Constitutional:  Negative for malaise/fatigue.  Respiratory:  Negative for cough  and shortness of breath.   Cardiovascular:  Negative for chest pain.  Gastrointestinal:  Negative for abdominal pain.  Musculoskeletal:  Negative for back pain, joint pain and myalgias.  Skin: Negative.   Neurological:  Positive for focal weakness. Negative for dizziness.       Left side does not work   Psychiatric/Behavioral:  The patient is not nervous/anxious.     Physical Exam Constitutional:      General: She is not in acute distress.    Appearance: She is well-developed. She is not diaphoretic.  Neck:     Thyroid: No thyromegaly.  Cardiovascular:     Rate and Rhythm: Normal rate and regular rhythm.     Pulses: Normal pulses.     Heart sounds: Normal heart sounds.  Pulmonary:     Effort: Pulmonary effort is normal. No respiratory distress.     Breath sounds: Normal breath sounds.  Abdominal:     General: Bowel sounds are normal. There is no distension.     Palpations: Abdomen is soft.     Tenderness: There is no abdominal tenderness.  Musculoskeletal:     Cervical back: Neck supple.     Right lower leg: No edema.     Left lower leg: No edema.     Comments: Left upper and lower extremity hemiplegia Has some gross movement of left leg    Lymphadenopathy:     Cervical: No cervical adenopathy.  Skin:    General: Skin is warm and dry.  Neurological:     Mental Status: She is alert. Mental status is at baseline.     Comments: Oriented to self   Psychiatric:        Mood and Affect: Mood normal.      ASSESSMENT/ PLAN:  TODAY  Ischemic stroke/acute right pontine stroke/left hemiplegia: is stable will continue asa 81 mg daily will continue therapy as directed.   2. Somnolence: post stroke: is stable will continue amantadine 100 mg twice daily will monitor  3.  HOCM (hypertrophic obstructive cardiomyopathy) is without change in status; will monitor her status.   4. Dysphagia post stroke: no signs of aspiration present; is now on thin liquids   5. Stage 3a chronic  kidney disease: is stable bun 29; creat 1.07; GFR 50  6. Vascular dementia without behavioral disturbance: is stable weight is 128 pounds; will continue aricept 5 mg daily   7. Hypoalbuminemia due to protein calorie malnutrition: albumin 2.8 will begin prostat 30 mL with meals   8. Postoperative hypothyroidism: is stable tsh 2.093 will continue synthroid 75 mcg daily   9. Mixed dyslipidemia: is stable ldl 127; trig 196 will continue lipitor 40 mg daily   10. Psychosis in elderly without behavioral disturbance: is stable will continue seroquel 12.5 mg daily   11. Depression recurrent: is stable will continue zoloft 100 mg daily   12. GERD without esophagitis; is stable will continue pepcid 20 mg daily   13. Chronic constipation: is stable will continue miralax daily   14. Thoracic aortic atherosclerosis (cxr 01-13-15) is stable     Ok Edwards NP [facility]  Contact [phone] Monday through Friday 8am- 5pm  After hours call [phone]

## 2021-07-05 ENCOUNTER — Encounter: Payer: Self-pay | Admitting: Internal Medicine

## 2021-07-05 ENCOUNTER — Non-Acute Institutional Stay (SKILLED_NURSING_FACILITY): Payer: Medicare Other | Admitting: Internal Medicine

## 2021-07-05 DIAGNOSIS — N1831 Chronic kidney disease, stage 3a: Secondary | ICD-10-CM

## 2021-07-05 DIAGNOSIS — I639 Cerebral infarction, unspecified: Secondary | ICD-10-CM | POA: Diagnosis not present

## 2021-07-05 DIAGNOSIS — I10 Essential (primary) hypertension: Secondary | ICD-10-CM | POA: Diagnosis not present

## 2021-07-05 DIAGNOSIS — R400 Somnolence: Secondary | ICD-10-CM | POA: Insufficient documentation

## 2021-07-05 DIAGNOSIS — F339 Major depressive disorder, recurrent, unspecified: Secondary | ICD-10-CM | POA: Insufficient documentation

## 2021-07-05 DIAGNOSIS — F0390 Unspecified dementia without behavioral disturbance: Secondary | ICD-10-CM | POA: Insufficient documentation

## 2021-07-05 DIAGNOSIS — K5909 Other constipation: Secondary | ICD-10-CM | POA: Insufficient documentation

## 2021-07-05 DIAGNOSIS — R29818 Other symptoms and signs involving the nervous system: Secondary | ICD-10-CM | POA: Diagnosis not present

## 2021-07-05 DIAGNOSIS — F0150 Vascular dementia without behavioral disturbance: Secondary | ICD-10-CM | POA: Insufficient documentation

## 2021-07-05 DIAGNOSIS — E782 Mixed hyperlipidemia: Secondary | ICD-10-CM | POA: Insufficient documentation

## 2021-07-05 DIAGNOSIS — R4189 Other symptoms and signs involving cognitive functions and awareness: Secondary | ICD-10-CM | POA: Diagnosis not present

## 2021-07-05 DIAGNOSIS — I700 Atherosclerosis of aorta: Secondary | ICD-10-CM | POA: Insufficient documentation

## 2021-07-05 DIAGNOSIS — E876 Hypokalemia: Secondary | ICD-10-CM

## 2021-07-05 DIAGNOSIS — K219 Gastro-esophageal reflux disease without esophagitis: Secondary | ICD-10-CM | POA: Insufficient documentation

## 2021-07-05 DIAGNOSIS — N183 Chronic kidney disease, stage 3 unspecified: Secondary | ICD-10-CM | POA: Insufficient documentation

## 2021-07-05 NOTE — Assessment & Plan Note (Signed)
Possible deprescribing of the Aricept and psychotropic drugs discussed with daughters under the guidance of Neurologist.

## 2021-07-05 NOTE — Assessment & Plan Note (Signed)
Spironolactone with BMET monitor if diuretic antihypertensive needed

## 2021-07-05 NOTE — Assessment & Plan Note (Addendum)
Neurology has recommended permissive hypertension.  Antihypertensives will be considered if the systolic blood pressure is consistently and significantly greater than 160. HCTZ should be avoided as K+ was 2.6 @ admission.

## 2021-07-05 NOTE — Progress Notes (Signed)
NURSING HOME LOCATION: [facility] ROOM NUMBER:  129  CODE STATUS:  DNR  PCP: Seward Carol MD  This is a comprehensive admission note to this SNFperformed on this date less than 30 days from date of admission. Included are preadmission medical/surgical history; reconciled medication list; family history; social history and comprehensive review of systems.  Corrections and additions to the records were documented. Comprehensive physical exam was also performed. Additionally a clinical summary was entered for each active diagnosis pertinent to this admission in the Problem List to enhance continuity of care.  HPI: Patient was hospitalized 8/11 - 06/30/2021 for right pontine cerebrovascular accident with left hemiplegia. She presented to [facility] 8/8 with acute onset left-sided weakness and dysarthria as well as left lateral gaze with 1 episode of vomiting.  Cranial CT was negative for acute changes.  CT angiogram of the head and neck revealed no large vessel occlusion or high-grade stenosis.  A 2 mm aneurysm arising from the distal M1 segment of the right middle cerebral artery  &  a 1 mm aneurysm arising from the anterior communicating artery were present  No tPA was administered.  MRI confirmed an acute right pontine infarction.  There were multiple small remote bilateral cerebellar lacunar infarcts.  MRI also revealed mild chronic microvascular ischemic disease and moderate atrophy.  EEG was negative for seizure. Potassium was 2.6; this was repleted.She had been on HCTZ PTA. 81 mg aspirin was continued and Plavix 75 mg daily added for prophylaxis x1 month with subsequent anticipated monotherapy.  She did receive subcutaneous Lovenox for DVT prophylaxis.  Speech therapy recommended nectar thick diet because of dysphagia.  The patient had been living alone , but family was most supportive in respect to ADLs & nutrition.She was admitted for comprehensive rehab program  8/11 with inpatient therapy consisting of PT, ST, and OT at least 3 hours 5 days a week in attempt to assess ability to return home. Tylenol was continued for chronic low back pain.  Sertraline and Aricept were administered for mood stabilization along with Seroquel in the evening. Diet was able to be advanced to mechanical soft. Neurology recommended permissive hypertension and therefore no antihypertensive medications were added.  PTA she had been on verapamil and HCTZ as noted. Antihypertensive resumption was to be based on average blood pressure. After the intensive inpatient rehab, she was discharged to the SNF for ongoing PT/OT.  Past medical and surgical history: Includes history of essential hypertension, GERD, hypertrophic obstructive cardiomyopathy, hypothyroidism, IBS, lumbar radiculopathy, and vitamin D deficiency. Surgeries and procedures include abdominal hysterectomy, breast biopsy, skin cancer removal, thyroidectomy, and glaucoma surgery.  Social history: non smoker & non drinker.  Family history: Noncontributory due to advanced age   Review of systems: Clinical neurocognitive deficits made validity of responses questionable & hindered ROS completion.  The patient gave the day as Sunday, not Wednesday, and could not name the month or the year.  She could also not name the [place].  She stated "I feel terrible, everything".  She went on to say that she needed to go to bed and sleep. Her daughters were present and gave a past history of mood swings and depression which worsened after the death of her husband.  Aricept was prescribed over a year ago in her late 35s "to slow dementia ." One daughter states that she was living alone and using a walker for ambulation.  They would come and clean and provide meals every day. The patient  did complain of back pain which she states began since she was admitted.  By history she has history of chronic low back pain. Staff reports that the patient  had a fall while attempting to get out of bed yesterday; no injury was documented.  Physical exam:  Pertinent or positive findings: She appears her stated age, chronically ill and suboptimally nourished.  She is markedly lethargic.  Her voice is weak.  Teeth are intact but the upper teeth are somewhat lightly discolored.  Grade 1/2 systolic murmur is present at the base.  First and second heart sounds are increased at the left upper sternal border.  Breath sounds are decreased.  Pedal pulses are surprisingly strong.  Limb atrophy is present with marked interosseous wasting.  There is persistent L sided weakness to opposition of both upper & lower extremities.  General appearance: no acute distress, increased work of breathing is present.   Lymphatic: No lymphadenopathy about the head, neck, axilla. Eyes: No conjunctival inflammation or lid edema is present. There is no scleral icterus. Ears:  External ear exam shows no significant lesions or deformities.   Nose:  External nasal examination shows no deformity or inflammation. Nasal mucosa are pink and moist without lesions, exudates Oral exam: Lips and gums are healthy appearing.There is no oropharyngeal erythema or exudate. Neck:  No thyromegaly, masses, tenderness noted.    Heart:  Normal rate and regular rhythm without gallop, click, rub.  Lungs:  without wheezes, rhonchi, rales, rubs. Abdomen: Bowel sounds are normal.  Abdomen is soft and nontender with no organomegaly, hernias, masses. GU: Deferred  Extremities:  No cyanosis, clubbing, edema. Neurologic exam:  Balance, Rhomberg, finger to nose testing could not be completed due to clinical state Skin: Warm & dry w/o tenting. No significant lesions or rash.  See clinical summary under each active problem in the Problem List with associated updated therapeutic plan

## 2021-07-05 NOTE — Assessment & Plan Note (Signed)
Current creatinine 1.07 / GFR 50  ; CKD Stage 3a Medication List reviewed. No nephrotoxic agents identified.

## 2021-07-05 NOTE — Assessment & Plan Note (Addendum)
Persistent L sided weakness. PT/OT @ SNF.

## 2021-07-05 NOTE — Patient Instructions (Addendum)
See assessment and plan under each diagnosis in the problem list and acutely for this visit Total time 49 minutes; greater than 50% of the visit spent counseling patient's daughters and coordinating care for problems addressed at this encounter

## 2021-07-12 ENCOUNTER — Encounter: Payer: Self-pay | Admitting: Adult Health

## 2021-07-12 ENCOUNTER — Non-Acute Institutional Stay (SKILLED_NURSING_FACILITY): Payer: Medicare Other | Admitting: Adult Health

## 2021-07-12 ENCOUNTER — Encounter (HOSPITAL_COMMUNITY)
Admission: RE | Admit: 2021-07-12 | Discharge: 2021-07-12 | Disposition: A | Discharge status: Home / Self Care | Payer: Medicare Other | Source: Skilled Nursing Facility | Attending: Adult Health | Admitting: Adult Health

## 2021-07-12 DIAGNOSIS — I639 Cerebral infarction, unspecified: Secondary | ICD-10-CM | POA: Diagnosis not present

## 2021-07-12 DIAGNOSIS — E039 Hypothyroidism, unspecified: Secondary | ICD-10-CM | POA: Insufficient documentation

## 2021-07-12 DIAGNOSIS — F339 Major depressive disorder, recurrent, unspecified: Secondary | ICD-10-CM

## 2021-07-12 LAB — CBC WITH DIFFERENTIAL/PLATELET
Abs Immature Granulocytes: 0.07 10*3/uL (ref 0.00–0.07)
Basophils Absolute: 0.1 10*3/uL (ref 0.0–0.1)
Basophils Relative: 1 %
Eosinophils Absolute: 0.5 10*3/uL (ref 0.0–0.5)
Eosinophils Relative: 3 %
HCT: 42.8 % (ref 36.0–46.0)
Hemoglobin: 14.4 g/dL (ref 12.0–15.0)
Immature Granulocytes: 1 %
Lymphocytes Relative: 20 %
Lymphs Abs: 3 10*3/uL (ref 0.7–4.0)
MCH: 31.3 pg (ref 26.0–34.0)
MCHC: 33.6 g/dL (ref 30.0–36.0)
MCV: 93 fL (ref 80.0–100.0)
Monocytes Absolute: 1.3 10*3/uL — ABNORMAL HIGH (ref 0.1–1.0)
Monocytes Relative: 9 %
Neutro Abs: 10 10*3/uL — ABNORMAL HIGH (ref 1.7–7.7)
Neutrophils Relative %: 66 %
Platelets: 271 10*3/uL (ref 150–400)
RBC: 4.6 MIL/uL (ref 3.87–5.11)
RDW: 14.6 % (ref 11.5–15.5)
WBC: 14.9 10*3/uL — ABNORMAL HIGH (ref 4.0–10.5)
nRBC: 0 % (ref 0.0–0.2)

## 2021-07-12 LAB — COMPREHENSIVE METABOLIC PANEL
ALT: 23 U/L (ref 0–44)
AST: 21 U/L (ref 15–41)
Albumin: 3.6 g/dL (ref 3.5–5.0)
Alkaline Phosphatase: 61 U/L (ref 38–126)
Anion gap: 8 (ref 5–15)
BUN: 27 mg/dL — ABNORMAL HIGH (ref 8–23)
CO2: 25 mmol/L (ref 22–32)
Calcium: 9.1 mg/dL (ref 8.9–10.3)
Chloride: 106 mmol/L (ref 98–111)
Creatinine, Ser: 0.95 mg/dL (ref 0.44–1.00)
GFR, Estimated: 58 mL/min — ABNORMAL LOW (ref >60)
Glucose, Bld: 128 mg/dL — ABNORMAL HIGH (ref 70–99)
Potassium: 3.2 mmol/L — ABNORMAL LOW (ref 3.5–5.1)
Sodium: 139 mmol/L (ref 135–145)
Total Bilirubin: 0.6 mg/dL (ref 0.3–1.2)
Total Protein: 6.7 g/dL (ref 6.5–8.1)

## 2021-07-12 NOTE — Progress Notes (Signed)
Location:  [place] Room Number: 129 Place of Service:   snf    CODE STATUS: dnr   Allergies  Allergen Reactions   Hydrochlorothiazide     05/29/2021 K+ 2.6   Codeine Nausea Only    Chief Complaint  Patient presents with   Acute Visit    Behavioral concerns     HPI:  She is having increased behaviors in the afternoon. She is yelling out and cussing at staff and family. This is fairly consistent on a daily basis. She is anxious during these times. Her family would like for her have something to help with her mood state. She is presently taking zoloft 100 mg daily   Past Medical History:  Diagnosis Date   Decreased sense of smell    and taste-negative CT scan-2011   Depression    Essential hypertension    GERD (gastroesophageal reflux disease)    Heart murmur    heard first time 4/12   History of mammogram    2010 and she does not want to repeat them anymore   HOCM (hypertrophic obstructive cardiomyopathy) ([place])    a. Dx by echo 05/2014.   Hypothyroidism    IBS (irritable bowel syndrome)    Impaired fasting glucose    Low back pain    Lumbar radiculopathy    with negative MRI (1991)   Postmenopausal    with osteopenia, bisphosphonates 1/05-1/11, improved osteopenia 4/12-repeat planned late 2015   Shingles    2015   Thyroid nodule    Vitamin D deficiency    repleted on weekly vitamin D 50000 iu    Past Surgical History:  Procedure Laterality Date   ABDOMINAL HYSTERECTOMY     BREAST BIOPSY     x2   CATARACT EXTRACTION     removal with IOL bilateral   GLAUCOMA SURGERY     laser   skin cancer removal     THYROIDECTOMY     subtotal    Social History   Socioeconomic History   Marital status: Married    Spouse name: Not on file   Number of children: Not on file   Years of education: Not on file   Highest education level: Not on file  Occupational History   Not on file  Tobacco Use   Smoking status: Never   Smokeless  tobacco: Never  Vaping Use   Vaping Use: Never used  Substance and Sexual Activity   Alcohol use: No    Alcohol/week: 0.0 standard drinks   Drug use: Never   Sexual activity: Not on file  Other Topics Concern   Not on file  Social History Narrative   Not on file   Social Determinants of Health   Financial Resource Strain: Not on file  Food Insecurity: Not on file  Transportation Needs: Not on file  Physical Activity: Not on file  Stress: Not on file  Social Connections: Not on file  Intimate Partner Violence: Not on file   Family History  Problem Relation Age of Onset   Breast cancer Mother    Hypertension Father    CVA Father    Heart failure Sister    CAD Sister    Kidney disease Sister    COPD Brother    Lung cancer Brother    CAD Brother    Diabetes Brother    Stroke Father    Heart attack Neg Hx       VITAL SIGNS BP Marland Kitchen)  108/52   Pulse 89   Temp (!) 96.4 F (35.8 C)   Ht 5\' 2"  (1.575 m)   Wt 128 lb 4.8 oz (58.2 kg) Comment: from epic  BMI 23.47 kg/m   Outpatient Encounter Medications as of 07/12/2021  Medication Sig   acetaminophen (TYLENOL) 325 MG tablet Take 2 tablets (650 mg total) by mouth every 4 (four) hours as needed for mild pain (or temp > 37.5 C (99.5 F)).   amantadine (SYMMETREL) 50 MG/5ML solution Take 100 mg by mouth 2 (two) times daily. With breakfast and lunch   aspirin EC 81 MG EC tablet Take 1 tablet (81 mg total) by mouth daily with breakfast. Swallow whole.   atorvastatin (LIPITOR) 40 MG tablet Take 1 tablet (40 mg total) by mouth daily.   Cholecalciferol (VITAMIN D-3) 25 MCG (1000 UT) CAPS Take 1 capsule by mouth daily.   donepezil (ARICEPT) 5 MG tablet Take 1 tablet (5 mg total) by mouth at bedtime.   famotidine (PEPCID) 20 MG tablet Take 1 tablet (20 mg total) by mouth daily.   levothyroxine (SYNTHROID, LEVOTHROID) 75 MCG tablet Take 75 mcg by mouth daily before breakfast.    NON FORMULARY Diet: Dysphagia 3 diet with nectar thick  liquids   polyethylene glycol (MIRALAX / GLYCOLAX) 17 g packet Take 17 g by mouth daily.   pyridOXINE (VITAMIN B-6) 25 MG tablet Take 25 mg by mouth daily.   QUEtiapine (SEROQUEL) 25 MG tablet Take 0.5 tablets (12.5 mg total) by mouth daily after supper.   sertraline (ZOLOFT) 100 MG tablet Take 1 tablet (100 mg total) by mouth daily.   vitamin C (ASCORBIC ACID) 500 MG tablet Take 500 mg by mouth daily.   zinc sulfate 220 (50 Zn) MG capsule Take 1 capsule (220 mg total) by mouth daily.   No facility-administered encounter medications on file as of 07/12/2021.     SIGNIFICANT DIAGNOSTIC EXAMS  PREVIOUS   05-29-21: ct of head code stroke:  No evidence of acute intracranial abnormality. ASPECTS is 10. Mild generalized cerebral atrophy. Paranasal sinus disease at the imaged levels, as described.  05-29-21: ct angio of head and neck:  CTA neck: 1. The common carotid and internal carotid arteries are patent within the neck without stenosis. Mild atherosclerotic plaque within both carotid systems within the neck, as described. 2. Vertebral arteries patent within the neck. Mild-to-moderate stenosis within the distal V2 segment of the non-dominant left vertebral artery. 3. Mild nonspecific fusiform dilation of the distal cervical left ICA to 7 mm. CTA head: 1. No intracranial large vessel occlusion or proximal high-grade arterial stenosis identified. 2. Intracranial atherosclerotic disease, as described. 3. 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 At least one branch vessel arises from this aneurysm. 4. 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  05-29-21: MRI of brain:  1. Acute right pontine infarct. Mild associated edema without mass effect. 2. Multiple small remote bilateral cerebellar lacunar infarcts. 3. Mild for age chronic microvascular ischemic disease and moderate atrophy.  05-29-21: 2-d echo:  1. No SAM noted mid/apical  cavity obliteration in systole with some flow  acceleration peak velocity in 73m/sec range . Left ventricular ejection  fraction, by estimation, is 60 to 65%. The left ventricle has normal  function. The left ventricle has no  regional wall motion abnormalities. There is severe left ventricular  hypertrophy. Left ventricular diastolic parameters are consistent with  Grade I diastolic dysfunction (impaired relaxation).  05-30-21: EEG This study is suggestive of mild diffuse encephalopathy, nonspecific etiology. No seizures or epileptiform discharges were seen throughout the recording.   NO NEW EXAMS.   LABS REVIEWED PREVIOUS    05-29-21: wbc 21.3; hgb 15.6; hct 45.5; mcv 87.2 plt 317; glucose 137; bun 18; creat 0.90; k+ 2.6; na++ 136; ca 8.9; GFR>60; liver normal albumin 4.2; mag 1.8; tsh 2.093; vit B 12: 584 vit D 36.20 05-30-21: hgb a1c 5.7; chol 203; ldl 127; trig 196 hdl 37 06-16-21: wbc 10.7; hb 13.4; hct 40.3; mcv 90.6 plt 250; glucose 105; bun 33; creat 1.03; k+ 4.2; na++ 137; ca 9.1; GFR 53 06-26-21: wbc 7.5; hgb 12.7; hct 38.2; mcv 90.7 plt 22; glucose 112; bun 29; creat 1.07; k+ 3.9; na++ 139; ca 9,4; GFR 50  06-27-21: urine culture: 20,000 proteus mirabilis   NO NEW LABS.   Review of Systems  Constitutional:  Negative for malaise/fatigue.  Respiratory:  Negative for cough and shortness of breath.   Cardiovascular:  Negative for chest pain, palpitations and leg swelling.  Gastrointestinal:  Negative for abdominal pain, constipation and heartburn.  Musculoskeletal:  Negative for back pain, joint pain and myalgias.  Skin: Negative.   Neurological:  Negative for dizziness.  Psychiatric/Behavioral:  The patient is nervous/anxious.    Physical Exam Constitutional:      General: She is not in acute distress.    Appearance: She is well-developed. She is not diaphoretic.  Neck:     Thyroid: No thyromegaly.  Cardiovascular:     Rate and Rhythm: Normal rate and regular rhythm.     Pulses:  Normal pulses.     Heart sounds: Normal heart sounds.  Pulmonary:     Effort: Pulmonary effort is normal. No respiratory distress.     Breath sounds: Normal breath sounds.  Abdominal:     General: Bowel sounds are normal. There is no distension.     Palpations: Abdomen is soft.     Tenderness: There is no abdominal tenderness.  Musculoskeletal:     Cervical back: Neck supple.     Right lower leg: No edema.     Left lower leg: No edema.     Comments: Left upper and lower extremity hemiplegia Has some gross movement of left leg   Lymphadenopathy:     Cervical: No cervical adenopathy.  Skin:    General: Skin is warm and dry.  Neurological:     Mental Status: She is alert. Mental status is at baseline.  Psychiatric:        Mood and Affect: Mood normal.     ASSESSMENT/ PLAN:  TODAY  Depression recurrent: is worse; will change to zoloft 125 mg daily will monitor her status.    Ok Edwards NP [facility]  Contact [phone] Monday through Friday 8am- 5pm  After hours call [phone]

## 2021-07-14 ENCOUNTER — Non-Acute Institutional Stay (SKILLED_NURSING_FACILITY): Payer: Medicare Other | Admitting: Adult Health

## 2021-07-14 ENCOUNTER — Encounter: Payer: Self-pay | Admitting: Adult Health

## 2021-07-14 DIAGNOSIS — I6350 Cerebral infarction due to unspecified occlusion or stenosis of unspecified cerebral artery: Secondary | ICD-10-CM

## 2021-07-14 DIAGNOSIS — I700 Atherosclerosis of aorta: Secondary | ICD-10-CM

## 2021-07-14 DIAGNOSIS — F0150 Vascular dementia without behavioral disturbance: Secondary | ICD-10-CM | POA: Diagnosis not present

## 2021-07-14 DIAGNOSIS — G8194 Hemiplegia, unspecified affecting left nondominant side: Secondary | ICD-10-CM | POA: Diagnosis not present

## 2021-07-14 NOTE — Progress Notes (Signed)
Location:  [place] Room Number: 129-P Place of Service:  SNF (31)   CODE STATUS: DNR  Allergies  Allergen Reactions   Hydrochlorothiazide     05/29/2021 K+ 2.6   Codeine Nausea Only    Chief Complaint  Patient presents with   Acute Visit    Care plan meeting     HPI:  We have come together for her care plan meeting. Family present   BIMS 5/15 mood 3/30: feels tired; down and fidgety sometimes; yelling out at times.  She is extensive assist to dependent for her adls. She is nonambulatory. She is frequently incontinent of bladder and bowel. She has had 3 falls without injury. She is able to feed herself. Dietary regular thin liquids; good appetite; weight is 131 pounds; on prostat for low albumin.  She has recently had her zoloft increased due to depressive and anxious behaviors.   Therapy   short term memory remains poor; working with ST; transfers: contact guard; bed mobility min assist upper body min to moderate assist lower part moderate assist toileting remains max assist .   She continues to be followed for her chronic illnesses including:  Left hemiplegia  Right pontine cerebrovascular accident  Thoracic aortic atherosclerosis  Vascular dementia without behavioral disturbance  Past Medical History:  Diagnosis Date   Decreased sense of smell    and taste-negative CT scan-2011   Depression    Essential hypertension    GERD (gastroesophageal reflux disease)    Heart murmur    heard first time 4/12   History of mammogram    2010 and she does not want to repeat them anymore   HOCM (hypertrophic obstructive cardiomyopathy) ([place])    a. Dx by echo 05/2014.   Hypothyroidism    IBS (irritable bowel syndrome)    Impaired fasting glucose    Low back pain    Lumbar radiculopathy    with negative MRI (1991)   Postmenopausal    with osteopenia, bisphosphonates 1/05-1/11, improved osteopenia 4/12-repeat planned late 2015   Shingles    2015   Thyroid nodule     Vitamin D deficiency    repleted on weekly vitamin D 50000 iu    Past Surgical History:  Procedure Laterality Date   ABDOMINAL HYSTERECTOMY     BREAST BIOPSY     x2   CATARACT EXTRACTION     removal with IOL bilateral   GLAUCOMA SURGERY     laser   skin cancer removal     THYROIDECTOMY     subtotal    Social History   Socioeconomic History   Marital status: Married    Spouse name: Not on file   Number of children: Not on file   Years of education: Not on file   Highest education level: Not on file  Occupational History   Not on file  Tobacco Use   Smoking status: Never   Smokeless tobacco: Never  Vaping Use   Vaping Use: Never used  Substance and Sexual Activity   Alcohol use: No    Alcohol/week: 0.0 standard drinks   Drug use: Never   Sexual activity: Not on file  Other Topics Concern   Not on file  Social History Narrative   Not on file   Social Determinants of Health   Financial Resource Strain: Not on file  Food Insecurity: Not on file  Transportation Needs: Not on file  Physical Activity: Not on file  Stress: Not on file  Social Connections: Not on file  Intimate Partner Violence: Not on file   Family History  Problem Relation Age of Onset   Breast cancer Mother    Hypertension Father    CVA Father    Heart failure Sister    CAD Sister    Kidney disease Sister    COPD Brother    Lung cancer Brother    CAD Brother    Diabetes Brother    Stroke Father    Heart attack Neg Hx       VITAL SIGNS BP (!) 108/52   Pulse 89   Temp (!) 96.4 F (35.8 C)   Resp 20   Ht 5\' 2"  (1.575 m)   SpO2 95%   BMI 23.47 kg/m   Outpatient Encounter Medications as of 07/14/2021  Medication Sig   acetaminophen (TYLENOL) 325 MG tablet Take 2 tablets (650 mg total) by mouth every 4 (four) hours as needed for mild pain (or temp > 37.5 C (99.5 F)).   amantadine (SYMMETREL) 50 MG/5ML solution Take 100 mg by mouth 2 (two) times daily. With breakfast and lunch    Amino Acids-Protein Hydrolys (FEEDING SUPPLEMENT, PRO-STAT SUGAR FREE 64,) LIQD Take 30 mLs by mouth 3 (three) times daily with meals. for albumin 2.8   aspirin EC 81 MG EC tablet Take 1 tablet (81 mg total) by mouth daily with breakfast. Swallow whole.   atorvastatin (LIPITOR) 40 MG tablet Take 1 tablet (40 mg total) by mouth daily.   Cholecalciferol (VITAMIN D-3) 25 MCG (1000 UT) CAPS Take 1 capsule by mouth daily.   donepezil (ARICEPT) 5 MG tablet Take 1 tablet (5 mg total) by mouth at bedtime.   famotidine (PEPCID) 20 MG tablet Take 1 tablet (20 mg total) by mouth daily.   levothyroxine (SYNTHROID, LEVOTHROID) 75 MCG tablet Take 75 mcg by mouth daily before breakfast.    Menthol, Topical Analgesic, (BIOFREEZE) 4 % GEL Apply topically as needed. Left shoulder pain   NON FORMULARY Diet:  Regular, thin liquids   polyethylene glycol (MIRALAX / GLYCOLAX) 17 g packet Take 17 g by mouth daily.   pyridOXINE (VITAMIN B-6) 25 MG tablet Take 25 mg by mouth daily.   QUEtiapine (SEROQUEL) 25 MG tablet Take 0.5 tablets (12.5 mg total) by mouth daily after supper.   sertraline (ZOLOFT) 100 MG tablet Take 1 tablet (100 mg total) by mouth daily.   sertraline (ZOLOFT) 25 MG tablet Take 25 mg by mouth daily. give with 100 mg dose for 125 mg total.   vitamin C (ASCORBIC ACID) 500 MG tablet Take 500 mg by mouth daily.   [DISCONTINUED] zinc sulfate 220 (50 Zn) MG capsule Take 1 capsule (220 mg total) by mouth daily.   No facility-administered encounter medications on file as of 07/14/2021.     SIGNIFICANT DIAGNOSTIC EXAMS  PREVIOUS   05-29-21: ct of head code stroke:  No evidence of acute intracranial abnormality. ASPECTS is 10. Mild generalized cerebral atrophy. Paranasal sinus disease at the imaged levels, as described.  05-29-21: ct angio of head and neck:  CTA neck: 1. The common carotid and internal carotid arteries are patent within the neck without stenosis. Mild atherosclerotic plaque within both  carotid systems within the neck, as described. 2. Vertebral arteries patent within the neck. Mild-to-moderate stenosis within the distal V2 segment of the non-dominant left vertebral artery. 3. Mild nonspecific fusiform dilation of the distal cervical left ICA to 7 mm. CTA head: 1. No intracranial large vessel occlusion or  proximal high-grade arterial stenosis identified. 2. Intracranial atherosclerotic disease, as described. 3. 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 At least one branch vessel arises from this aneurysm. 4. 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  05-29-21: MRI of brain:  1. Acute right pontine infarct. Mild associated edema without mass effect. 2. Multiple small remote bilateral cerebellar lacunar infarcts. 3. Mild for age chronic microvascular ischemic disease and moderate atrophy.  05-29-21: 2-d echo:  1. No SAM noted mid/apical cavity obliteration in systole with some flow  acceleration peak velocity in 30m/sec range . Left ventricular ejection  fraction, by estimation, is 60 to 65%. The left ventricle has normal  function. The left ventricle has no  regional wall motion abnormalities. There is severe left ventricular  hypertrophy. Left ventricular diastolic parameters are consistent with  Grade I diastolic dysfunction (impaired relaxation).   05-30-21: EEG This study is suggestive of mild diffuse encephalopathy, nonspecific etiology. No seizures or epileptiform discharges were seen throughout the recording.   NO NEW EXAMS  LABS REVIEWED PREVIOUS   05-29-21: wbc 21.3; hgb 15.6; hct 45.5; mcv 87.2 plt 317; glucose 137; bun 18; creat 0.90; k+ 2.6; na++ 136; ca 8.9; GFR>60; liver normal albumin 4.2; mag 1.8; tsh 2.093; vit B 12: 584 vit D 36.20 05-30-21: hgb a1c 5.7; chol 203; ldl 127; trig 196 hdl 37 06-16-21: wbc 10.7; hb 13.4; hct 40.3; mcv 90.6 plt 250; glucose 105; bun 33; creat 1.03; k+ 4.2; na++ 137; ca 9.1;  GFR 53 06-26-21: wbc 7.5; hgb 12.7; hct 38.2; mcv 90.7 plt 22; glucose 112; bun 29; creat 1.07; k+ 3.9; na++ 139; ca 9,4; GFR 50  06-27-21: urine culture: 20,000 proteus mirabilis   NO NEW LABS.   Review of Systems  Constitutional:  Negative for malaise/fatigue.  Respiratory:  Negative for cough and shortness of breath.   Cardiovascular:  Negative for chest pain, palpitations and leg swelling.  Gastrointestinal:  Negative for abdominal pain, constipation and heartburn.  Musculoskeletal:  Negative for back pain, joint pain and myalgias.  Skin: Negative.   Neurological:  Positive for weakness. Negative for dizziness.       Left side   Psychiatric/Behavioral:  The patient is not nervous/anxious.    Physical Exam Constitutional:      General: She is not in acute distress.    Appearance: She is well-developed. She is not diaphoretic.  Neck:     Thyroid: No thyromegaly.  Cardiovascular:     Rate and Rhythm: Normal rate and regular rhythm.     Pulses: Normal pulses.     Heart sounds: Normal heart sounds.  Pulmonary:     Effort: Pulmonary effort is normal. No respiratory distress.     Breath sounds: Normal breath sounds.  Abdominal:     General: Bowel sounds are normal. There is no distension.     Palpations: Abdomen is soft.     Tenderness: There is no abdominal tenderness.  Musculoskeletal:     Cervical back: Neck supple.     Right lower leg: No edema.     Left lower leg: No edema.     Comments: Left hemiplegia  Lymphadenopathy:     Cervical: No cervical adenopathy.  Skin:    General: Skin is warm and dry.  Neurological:     Mental Status: She is alert. Mental status is at baseline.  Psychiatric:        Mood and Affect: Mood normal.  ASSESSMENT/ PLAN:  TODAY  Left hemiplegia Right pontine cerebrovascular accident Thoracic aortic atherosclerosis Vascular dementia without behavioral disturbance  Will begin namenda titration Will continue therapy as directed Will  continue to monitor her status Her goal remains to return back home   Time spent with patient 40 minutes: medications; therapy and emotional needs.     Ok Edwards NP [facility]  Contact [phone] Monday through Friday 8am- 5pm  After hours call [phone]

## 2021-07-27 ENCOUNTER — Encounter: Payer: Self-pay | Admitting: Adult Health

## 2021-07-27 NOTE — Progress Notes (Signed)
Location:  [place] Room Number: 129 Place of Service:  SNF (31)   CODE STATUS: dnr  Allergies  Allergen Reactions  . Hydrochlorothiazide     05/29/2021 K+ 2.6  . Codeine Nausea Only    Chief Complaint  Patient presents with  . Acute Visit    Status post fall   . Error    HPI:  ***  Past Medical History:  Diagnosis Date  . Decreased sense of smell    and taste-negative CT scan-2011  . Depression   . Essential hypertension   . GERD (gastroesophageal reflux disease)   . Heart murmur    heard first time 4/12  . History of mammogram    2010 and she does not want to repeat them anymore  . HOCM (hypertrophic obstructive cardiomyopathy) ([place])    a. Dx by echo 05/2014.  Marland Kitchen Hypothyroidism   . IBS (irritable bowel syndrome)   . Impaired fasting glucose   . Low back pain   . Lumbar radiculopathy    with negative MRI (1991)  . Postmenopausal    with osteopenia, bisphosphonates 1/05-1/11, improved osteopenia 4/12-repeat planned late 2015  . Shingles    2015  . Thyroid nodule   . Vitamin D deficiency    repleted on weekly vitamin D 50000 iu    Past Surgical History:  Procedure Laterality Date  . ABDOMINAL HYSTERECTOMY    . BREAST BIOPSY     x2  . CATARACT EXTRACTION     removal with IOL bilateral  . GLAUCOMA SURGERY     laser  . skin cancer removal    . THYROIDECTOMY     subtotal    Social History   Socioeconomic History  . Marital status: Married    Spouse name: Not on file  . Number of children: Not on file  . Years of education: Not on file  . Highest education level: Not on file  Occupational History  . Not on file  Tobacco Use  . Smoking status: Never  . Smokeless tobacco: Never  Vaping Use  . Vaping Use: Never used  Substance and Sexual Activity  . Alcohol use: No    Alcohol/week: 0.0 standard drinks  . Drug use: Never  . Sexual activity: Not on file  Other Topics Concern  . Not on file  Social History Narrative   . Not on file   Social Determinants of Health   Financial Resource Strain: Not on file  Food Insecurity: Not on file  Transportation Needs: Not on file  Physical Activity: Not on file  Stress: Not on file  Social Connections: Not on file  Intimate Partner Violence: Not on file   Family History  Problem Relation Age of Onset  . Breast cancer Mother   . Hypertension Father   . CVA Father   . Heart failure Sister   . CAD Sister   . Kidney disease Sister   . COPD Brother   . Lung cancer Brother   . CAD Brother   . Diabetes Brother   . Stroke Father   . Heart attack Neg Hx       VITAL SIGNS BP 134/70   Pulse 80   Temp 98.6 F (37 C)   Ht 5\' 2"  (1.575 m)   Wt 132 lb 6.4 oz (60.1 kg)   BMI 24.22 kg/m   Outpatient Encounter Medications as of 07/27/2021  Medication Sig  . acetaminophen (TYLENOL) 325 MG tablet Take 2  tablets (650 mg total) by mouth every 4 (four) hours as needed for mild pain (or temp > 37.5 C (99.5 F)).  Marland Kitchen amantadine (SYMMETREL) 50 MG/5ML solution Take 100 mg by mouth 2 (two) times daily. With breakfast and lunch  . Amino Acids-Protein Hydrolys (FEEDING SUPPLEMENT, PRO-STAT SUGAR FREE 64,) LIQD Take 30 mLs by mouth 3 (three) times daily with meals. for albumin 2.8  . aspirin EC 81 MG EC tablet Take 1 tablet (81 mg total) by mouth daily with breakfast. Swallow whole.  Marland Kitchen atorvastatin (LIPITOR) 40 MG tablet Take 1 tablet (40 mg total) by mouth daily.  . Cholecalciferol (VITAMIN D-3) 25 MCG (1000 UT) CAPS Take 1 capsule by mouth daily.  Marland Kitchen donepezil (ARICEPT) 5 MG tablet Take 1 tablet (5 mg total) by mouth at bedtime.  . famotidine (PEPCID) 20 MG tablet Take 1 tablet (20 mg total) by mouth daily.  Marland Kitchen levothyroxine (SYNTHROID, LEVOTHROID) 75 MCG tablet Take 75 mcg by mouth daily before breakfast.   . Menthol, Topical Analgesic, (BIOFREEZE) 4 % GEL Apply topically as needed. Left shoulder pain  . NON FORMULARY Diet:  Regular, thin liquids  . polyethylene glycol  (MIRALAX / GLYCOLAX) 17 g packet Take 17 g by mouth daily.  Marland Kitchen pyridOXINE (VITAMIN B-6) 25 MG tablet Take 25 mg by mouth daily.  . QUEtiapine (SEROQUEL) 25 MG tablet Take 0.5 tablets (12.5 mg total) by mouth daily after supper.  . sertraline (ZOLOFT) 100 MG tablet Take 1 tablet (100 mg total) by mouth daily.  . sertraline (ZOLOFT) 25 MG tablet Take 25 mg by mouth daily. give with 100 mg dose for 125 mg total.  . vitamin C (ASCORBIC ACID) 500 MG tablet Take 500 mg by mouth daily.   No facility-administered encounter medications on file as of 07/27/2021.     SIGNIFICANT DIAGNOSTIC EXAMS       ASSESSMENT/ PLAN:     Ok Edwards NP [facility]  Contact [phone] Monday through Friday 8am- 5pm  After hours call [phone]   This encounter was created in error - please disregard.

## 2021-07-28 ENCOUNTER — Encounter (HOSPITAL_COMMUNITY)
Admission: RE | Admit: 2021-07-28 | Discharge: 2021-07-28 | Disposition: A | Discharge status: Home / Self Care | Payer: Medicare Other | Source: Skilled Nursing Facility | Attending: Adult Health | Admitting: Adult Health

## 2021-07-28 DIAGNOSIS — F0150 Vascular dementia without behavioral disturbance: Secondary | ICD-10-CM | POA: Insufficient documentation

## 2021-07-28 LAB — CBC
HCT: 38.4 % (ref 36.0–46.0)
Hemoglobin: 12.8 g/dL (ref 12.0–15.0)
MCH: 31.2 pg (ref 26.0–34.0)
MCHC: 33.3 g/dL (ref 30.0–36.0)
MCV: 93.7 fL (ref 80.0–100.0)
Platelets: 237 10*3/uL (ref 150–400)
RBC: 4.1 MIL/uL (ref 3.87–5.11)
RDW: 14.1 % (ref 11.5–15.5)
WBC: 9.5 10*3/uL (ref 4.0–10.5)
nRBC: 0 % (ref 0.0–0.2)

## 2021-07-28 LAB — COMPREHENSIVE METABOLIC PANEL
ALT: 19 U/L (ref 0–44)
AST: 21 U/L (ref 15–41)
Albumin: 3.2 g/dL — ABNORMAL LOW (ref 3.5–5.0)
Alkaline Phosphatase: 62 U/L (ref 38–126)
Anion gap: 6 (ref 5–15)
BUN: 25 mg/dL — ABNORMAL HIGH (ref 8–23)
CO2: 27 mmol/L (ref 22–32)
Calcium: 8.2 mg/dL — ABNORMAL LOW (ref 8.9–10.3)
Chloride: 106 mmol/L (ref 98–111)
Creatinine, Ser: 0.81 mg/dL (ref 0.44–1.00)
GFR, Estimated: >60 mL/min (ref >60)
Glucose, Bld: 102 mg/dL — ABNORMAL HIGH (ref 70–99)
Potassium: 3.3 mmol/L — ABNORMAL LOW (ref 3.5–5.1)
Sodium: 139 mmol/L (ref 135–145)
Total Bilirubin: 0.8 mg/dL (ref 0.3–1.2)
Total Protein: 6.2 g/dL — ABNORMAL LOW (ref 6.5–8.1)

## 2021-07-28 LAB — VITAMIN B12: Vitamin B-12: 261 pg/mL (ref 180–914)

## 2021-07-28 LAB — TSH: TSH: 2.457 u[IU]/mL (ref 0.350–4.500)

## 2021-07-28 LAB — FOLATE: Folate: 9.7 ng/mL (ref >5.9)

## 2021-07-31 ENCOUNTER — Encounter: Payer: Self-pay | Admitting: Adult Health

## 2021-07-31 ENCOUNTER — Non-Acute Institutional Stay (SKILLED_NURSING_FACILITY): Payer: Medicare Other | Admitting: Adult Health

## 2021-07-31 DIAGNOSIS — N1831 Chronic kidney disease, stage 3a: Secondary | ICD-10-CM | POA: Diagnosis not present

## 2021-07-31 DIAGNOSIS — I69391 Dysphagia following cerebral infarction: Secondary | ICD-10-CM | POA: Diagnosis not present

## 2021-07-31 DIAGNOSIS — E8809 Other disorders of plasma-protein metabolism, not elsewhere classified: Secondary | ICD-10-CM | POA: Diagnosis not present

## 2021-07-31 DIAGNOSIS — F0150 Vascular dementia without behavioral disturbance: Secondary | ICD-10-CM

## 2021-07-31 DIAGNOSIS — E46 Unspecified protein-calorie malnutrition: Secondary | ICD-10-CM | POA: Diagnosis not present

## 2021-07-31 NOTE — Progress Notes (Signed)
Location:  [facility] Room Number: 129-P Place of Service:  SNF (31)   CODE STATUS: DNR  Allergies  Allergen Reactions   Hydrochlorothiazide     05/29/2021 K+ 2.6   Codeine Nausea Only    Chief Complaint  Patient presents with   Medical Management of Chronic Issues             Dysphagia post stroke:  Stage 3a chronic kidney disease:  Vascular dementia without behavioral disturbance:  Hypoalbuminemia due to protein calorie malnutrition    HPI:  She is a long term resident of this facility being seen for the management of her chronic illnesses: Dysphagia post stroke:  Stage 3a chronic kidney disease:  Vascular dementia without behavioral disturbance:  Hypoalbuminemia due to protein calorie malnutrition. There are no reports of uncontrolled pain. She continues to participate in therapy. She has been having periods of time when she yells out and can be aggressive with staff. They start without a real cause; and stop without reason as well. I have spoken with her daughters about her issues; we will be tracking her for possible PBA.   Past Medical History:  Diagnosis Date   Decreased sense of smell    and taste-negative CT scan-2011   Depression    Essential hypertension    GERD (gastroesophageal reflux disease)    Heart murmur    heard first time 4/12   History of mammogram    2010 and she does not want to repeat them anymore   HOCM (hypertrophic obstructive cardiomyopathy) ([place])    a. Dx by echo 05/2014.   Hypothyroidism    IBS (irritable bowel syndrome)    Impaired fasting glucose    Low back pain    Lumbar radiculopathy    with negative MRI (1991)   Postmenopausal    with osteopenia, bisphosphonates 1/05-1/11, improved osteopenia 4/12-repeat planned late 2015   Shingles    2015   Thyroid nodule    Vitamin D deficiency    repleted on weekly vitamin D 50000 iu    Past Surgical History:  Procedure Laterality Date   ABDOMINAL HYSTERECTOMY      BREAST BIOPSY     x2   CATARACT EXTRACTION     removal with IOL bilateral   GLAUCOMA SURGERY     laser   skin cancer removal     THYROIDECTOMY     subtotal    Social History   Socioeconomic History   Marital status: Married    Spouse name: Not on file   Number of children: Not on file   Years of education: Not on file   Highest education level: Not on file  Occupational History   Not on file  Tobacco Use   Smoking status: Never   Smokeless tobacco: Never  Vaping Use   Vaping Use: Never used  Substance and Sexual Activity   Alcohol use: No    Alcohol/week: 0.0 standard drinks   Drug use: Never   Sexual activity: Not on file  Other Topics Concern   Not on file  Social History Narrative   Not on file   Social Determinants of Health   Financial Resource Strain: Not on file  Food Insecurity: Not on file  Transportation Needs: Not on file  Physical Activity: Not on file  Stress: Not on file  Social Connections: Not on file  Intimate Partner Violence: Not on file   Family History  Problem Relation Age of Onset  Breast cancer Mother    Hypertension Father    CVA Father    Heart failure Sister    CAD Sister    Kidney disease Sister    COPD Brother    Lung cancer Brother    CAD Brother    Diabetes Brother    Stroke Father    Heart attack Neg Hx       VITAL SIGNS BP 120/63   Pulse 80   Temp 98.4 F (36.9 C)   Resp 18   Ht 5\' 2"  (1.575 m)   Wt 132 lb 6.4 oz (60.1 kg)   SpO2 94%   BMI 24.22 kg/m   Outpatient Encounter Medications as of 07/31/2021  Medication Sig   acetaminophen (TYLENOL) 325 MG tablet Take 2 tablets (650 mg total) by mouth every 4 (four) hours as needed for mild pain (or temp > 37.5 C (99.5 F)).   amantadine (SYMMETREL) 50 MG/5ML solution Take 100 mg by mouth 2 (two) times daily. With breakfast and lunch   Amino Acids-Protein Hydrolys (FEEDING SUPPLEMENT, PRO-STAT SUGAR FREE 64,) LIQD Take 30 mLs by mouth 3 (three) times daily with  meals. for albumin 2.8   aspirin EC 81 MG EC tablet Take 1 tablet (81 mg total) by mouth daily with breakfast. Swallow whole.   atorvastatin (LIPITOR) 40 MG tablet Take 1 tablet (40 mg total) by mouth daily.   Cholecalciferol (VITAMIN D-3) 25 MCG (1000 UT) CAPS Take 1 capsule by mouth daily.   donepezil (ARICEPT) 5 MG tablet Take 1 tablet (5 mg total) by mouth at bedtime.   famotidine (PEPCID) 20 MG tablet Take 1 tablet (20 mg total) by mouth daily.   levothyroxine (SYNTHROID, LEVOTHROID) 75 MCG tablet Take 75 mcg by mouth daily before breakfast.    memantine (NAMENDA XR) 14 MG CP24 24 hr capsule Take 14 mg by mouth daily. [DX: Vascular dementia, unspecified severity, without behavioral disturbance, psychotic disturbance, mood disturbance, and anxiety]   Menthol, Topical Analgesic, (BIOFREEZE) 4 % GEL Apply topically as needed. Left shoulder pain   NON FORMULARY Diet:  Regular, thin liquids   oxymetazoline (AFRIN 12 HOUR) 0.05 % nasal spray Place 1 spray into both nostrils as needed. epistaxis   polyethylene glycol (MIRALAX / GLYCOLAX) 17 g packet Take 17 g by mouth daily.   pyridOXINE (VITAMIN B-6) 25 MG tablet Take 25 mg by mouth daily.   QUEtiapine (SEROQUEL) 25 MG tablet Take 0.5 tablets (12.5 mg total) by mouth daily after supper.   sertraline (ZOLOFT) 100 MG tablet Take 1 tablet (100 mg total) by mouth daily.   sertraline (ZOLOFT) 25 MG tablet Take 25 mg by mouth daily. give with 100 mg dose for 125 mg total.   vitamin C (ASCORBIC ACID) 500 MG tablet Take 500 mg by mouth daily.   No facility-administered encounter medications on file as of 07/31/2021.     SIGNIFICANT DIAGNOSTIC EXAMS   PREVIOUS   05-29-21: ct of head code stroke:  No evidence of acute intracranial abnormality. ASPECTS is 10. Mild generalized cerebral atrophy. Paranasal sinus disease at the imaged levels, as described.  05-29-21: ct angio of head and neck:  CTA neck: 1. The common carotid and internal carotid  arteries are patent within the neck without stenosis. Mild atherosclerotic plaque within both carotid systems within the neck, as described. 2. Vertebral arteries patent within the neck. Mild-to-moderate stenosis within the distal V2 segment of the non-dominant left vertebral artery. 3. Mild nonspecific fusiform dilation of the distal  cervical left ICA to 7 mm. CTA head: 1. No intracranial large vessel occlusion or proximal high-grade arterial stenosis identified. 2. Intracranial atherosclerotic disease, as described. 3. 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 At least one branch vessel arises from this aneurysm. 4. 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  05-29-21: MRI of brain:  1. Acute right pontine infarct. Mild associated edema without mass effect. 2. Multiple small remote bilateral cerebellar lacunar infarcts. 3. Mild for age chronic microvascular ischemic disease and moderate atrophy.  05-29-21: 2-d echo:  1. No SAM noted mid/apical cavity obliteration in systole with some flow  acceleration peak velocity in 41m/sec range . Left ventricular ejection  fraction, by estimation, is 60 to 65%. The left ventricle has normal  function. The left ventricle has no  regional wall motion abnormalities. There is severe left ventricular  hypertrophy. Left ventricular diastolic parameters are consistent with  Grade I diastolic dysfunction (impaired relaxation).   05-30-21: EEG This study is suggestive of mild diffuse encephalopathy, nonspecific etiology. No seizures or epileptiform discharges were seen throughout the recording.   NO NEW EXAMS  LABS REVIEWED PREVIOUS   05-29-21: wbc 21.3; hgb 15.6; hct 45.5; mcv 87.2 plt 317; glucose 137; bun 18; creat 0.90; k+ 2.6; na++ 136; ca 8.9; GFR>60; liver normal albumin 4.2; mag 1.8; tsh 2.093; vit B 12: 584 vit D 36.20 05-30-21: hgb a1c 5.7; chol 203; ldl 127; trig 196 hdl 37 06-16-21: wbc 10.7; hb  13.4; hct 40.3; mcv 90.6 plt 250; glucose 105; bun 33; creat 1.03; k+ 4.2; na++ 137; ca 9.1; GFR 53 06-26-21: wbc 7.5; hgb 12.7; hct 38.2; mcv 90.7 plt 22; glucose 112; bun 29; creat 1.07; k+ 3.9; na++ 139; ca 9,4; GFR 50  06-27-21: urine culture: 20,000 proteus mirabilis   NO NEW LABS.   Review of Systems  Constitutional:  Negative for malaise/fatigue.  Respiratory:  Negative for cough.   Cardiovascular:  Negative for chest pain.  Gastrointestinal:  Negative for abdominal pain.  Musculoskeletal:  Negative for back pain, joint pain and myalgias.  Skin: Negative.   Neurological:  Negative for dizziness.  Psychiatric/Behavioral:  The patient is not nervous/anxious.    Physical Exam Constitutional:      General: She is not in acute distress.    Appearance: She is well-developed. She is not diaphoretic.  Neck:     Thyroid: No thyromegaly.  Cardiovascular:     Rate and Rhythm: Normal rate and regular rhythm.     Pulses: Normal pulses.     Heart sounds: Normal heart sounds.  Pulmonary:     Effort: Pulmonary effort is normal. No respiratory distress.     Breath sounds: Normal breath sounds.  Abdominal:     General: Bowel sounds are normal. There is no distension.     Palpations: Abdomen is soft.     Tenderness: There is no abdominal tenderness.  Musculoskeletal:     Cervical back: Neck supple.     Right lower leg: No edema.     Left lower leg: No edema.     Comments: Left hemiplegia   Lymphadenopathy:     Cervical: No cervical adenopathy.  Skin:    General: Skin is warm and dry.  Neurological:     Mental Status: She is alert. Mental status is at baseline.  Psychiatric:        Mood and Affect: Mood normal.     ASSESSMENT/ PLAN:  TODAY  Dysphagia post stroke: no signs of aspiration present is on thin liquids  2. Stage 3a chronic kidney disease: is stable bun 29; creat 1.07; GFR 50   3. Vascular dementia without behavioral disturbance: weight is 132 pounds; will continue  aricept 5 mg daily   4. Hypoalbuminemia due to protein calorie malnutrition: albumin 2.8 will continue prostat 30 mL three times daily    PREVIOUS   5. Postoperative hypothyroidism: is stable tsh 2.093 will continue synthroid 75 mcg daily   6. Mixed dyslipidemia: is stable ldl 127; trig 196 will continue lipitor 40 mg daily   7. Psychosis in elderly without behavioral disturbance: is stable will continue seroquel 12.5 mg daily   8. Depression recurrent: is stable will continue zoloft 100 mg daily   9. GERD without esophagitis; is stable will continue pepcid 20 mg daily   10. Chronic constipation: is stable will continue miralax daily   11. Thoracic aortic atherosclerosis (cxr 01-13-15) is stable   12. Ischemic stroke/acute right pontine stroke/left hemiplegia: is stable will continue asa 81 mg daily will continue therapy as directed.   13. Somnolence: post stroke: is stable will continue amantadine 100 mg twice daily will monitor   14.  HOCM (hypertrophic obstructive cardiomyopathy) is without change in status; will monitor her status.      Ok Edwards NP [facility]  Contact [phone] Monday through Friday 8am- 5pm  After hours call [phone]

## 2021-08-16 DIAGNOSIS — J342 Deviated nasal septum: Secondary | ICD-10-CM | POA: Diagnosis not present

## 2021-08-16 DIAGNOSIS — R040 Epistaxis: Secondary | ICD-10-CM | POA: Diagnosis not present

## 2021-08-21 ENCOUNTER — Non-Acute Institutional Stay (INDEPENDENT_AMBULATORY_CARE_PROVIDER_SITE_OTHER): Payer: Medicare Other | Admitting: Adult Health

## 2021-08-21 ENCOUNTER — Encounter: Payer: Self-pay | Admitting: Adult Health

## 2021-08-21 DIAGNOSIS — Z0000 Encounter for general adult medical examination without abnormal findings: Secondary | ICD-10-CM

## 2021-08-21 NOTE — Progress Notes (Signed)
Subjective:   Vickie Mckee is a 87 y.o. female who presents for Medicare Annual (Subsequent) preventive examination.  Review of Systems    Review of Systems  Constitutional:  Negative for malaise/fatigue.  Respiratory:  Negative for cough and shortness of breath.   Cardiovascular:  Negative for chest pain, palpitations and leg swelling.  Gastrointestinal:  Negative for abdominal pain, constipation and heartburn.  Musculoskeletal:  Negative for back pain, joint pain and myalgias.  Skin: Negative.   Neurological:  Negative for dizziness.  Psychiatric/Behavioral:  The patient is not nervous/anxious.    Cardiac Risk Factors include: advanced age (>49men, >16 women);sedentary lifestyle     Objective:    Today's Vitals   08/21/21 1051 08/21/21 1324  BP: (!) 148/80   Pulse: 85   Resp: 20   Temp: 98.4 F (36.9 C)   SpO2: 94%   Weight: 132 lb 6.4 oz (60.1 kg)   Height: 5\' 2"  (1.575 m)   PainSc:  0-No pain   Body mass index is 24.22 kg/m.  Advanced Directives 08/21/2021 07/31/2021 07/14/2021 06/01/2021 05/30/2021 05/29/2021  Does Patient Have a Medical Advance Directive? Yes Yes Yes Yes - No  Type of Advance Directive Out of facility DNR (pink MOST or yellow form) Out of facility DNR (pink MOST or yellow form) Out of facility DNR (pink MOST or yellow form) [place] - -  Does patient want to make changes to medical advance directive? No - Patient declined No - Patient declined No - Patient declined No - Guardian declined - -  Would patient like information on creating a medical advance directive? - - - No - Guardian declined No - Patient declined -    Current Medications (verified) Outpatient Encounter Medications as of 08/21/2021  Medication Sig   acetaminophen (TYLENOL) 325 MG tablet Take 2 tablets (650 mg total) by mouth every 4 (four) hours as needed for mild pain (or temp > 37.5 C (99.5 F)).   amantadine (SYMMETREL) 50 MG/5ML solution Take 100 mg by  mouth 2 (two) times daily. With breakfast and lunch   Amino Acids-Protein Hydrolys (FEEDING SUPPLEMENT, PRO-STAT SUGAR FREE 64,) LIQD Take 30 mLs by mouth 3 (three) times daily with meals. for albumin 2.8   aspirin EC 81 MG EC tablet Take 1 tablet (81 mg total) by mouth daily with breakfast. Swallow whole.   atorvastatin (LIPITOR) 40 MG tablet Take 1 tablet (40 mg total) by mouth daily.   Cholecalciferol (VITAMIN D-3) 25 MCG (1000 UT) CAPS Take 1 capsule by mouth daily.   donepezil (ARICEPT) 5 MG tablet Take 1 tablet (5 mg total) by mouth at bedtime.   famotidine (PEPCID) 20 MG tablet Take 1 tablet (20 mg total) by mouth daily.   levothyroxine (SYNTHROID, LEVOTHROID) 75 MCG tablet Take 75 mcg by mouth daily before breakfast.    memantine (NAMENDA XR) 14 MG CP24 24 hr capsule Take 14 mg by mouth daily. [DX: Vascular dementia, unspecified severity, without behavioral disturbance, psychotic disturbance, mood disturbance, and anxiety]   Menthol, Topical Analgesic, (BIOFREEZE) 4 % GEL Apply topically as needed. Left shoulder pain   NON FORMULARY Diet:  Regular, thin liquids   oxymetazoline (AFRIN) 0.05 % nasal spray Place 1 spray into both nostrils as needed. epistaxis   polyethylene glycol (MIRALAX / GLYCOLAX) 17 g packet Take 17 g by mouth daily.   pyridOXINE (VITAMIN B-6) 25 MG tablet Take 25 mg by mouth daily.   QUEtiapine (SEROQUEL) 25 MG  tablet Take 0.5 tablets (12.5 mg total) by mouth daily after supper.   sertraline (ZOLOFT) 100 MG tablet Take 1 tablet (100 mg total) by mouth daily.   sertraline (ZOLOFT) 25 MG tablet Take 25 mg by mouth daily. give with 100 mg dose for 125 mg total.   vitamin C (ASCORBIC ACID) 500 MG tablet Take 500 mg by mouth daily.   No facility-administered encounter medications on file as of 08/21/2021.    Allergies (verified) Hydrochlorothiazide and Codeine   History: Past Medical History:  Diagnosis Date   Decreased sense of smell    and taste-negative CT  scan-2011   Depression    Essential hypertension    GERD (gastroesophageal reflux disease)    Heart murmur    heard first time 4/12   History of mammogram    2010 and she does not want to repeat them anymore   HOCM (hypertrophic obstructive cardiomyopathy) ([place])    a. Dx by echo 05/2014.   Hypothyroidism    IBS (irritable bowel syndrome)    Impaired fasting glucose    Low back pain    Lumbar radiculopathy    with negative MRI (1991)   Postmenopausal    with osteopenia, bisphosphonates 1/05-1/11, improved osteopenia 4/12-repeat planned late 2015   Shingles    2015   Thyroid nodule    Vitamin D deficiency    repleted on weekly vitamin D 50000 iu   Past Surgical History:  Procedure Laterality Date   ABDOMINAL HYSTERECTOMY     BREAST BIOPSY     x2   CATARACT EXTRACTION     removal with IOL bilateral   GLAUCOMA SURGERY     laser   skin cancer removal     THYROIDECTOMY     subtotal   Family History  Problem Relation Age of Onset   Breast cancer Mother    Hypertension Father    CVA Father    Heart failure Sister    CAD Sister    Kidney disease Sister    COPD Brother    Lung cancer Brother    CAD Brother    Diabetes Brother    Stroke Father    Heart attack Neg Hx    Social History   Socioeconomic History   Marital status: Married    Spouse name: Not on file   Number of children: Not on file   Years of education: Not on file   Highest education level: Not on file  Occupational History   Not on file  Tobacco Use   Smoking status: Never   Smokeless tobacco: Never  Vaping Use   Vaping Use: Never used  Substance and Sexual Activity   Alcohol use: No    Alcohol/week: 0.0 standard drinks   Drug use: Never   Sexual activity: Not on file  Other Topics Concern   Not on file  Social History Narrative   Not on file   Social Determinants of Health   Financial Resource Strain: Not on file  Food Insecurity: Not on file  Transportation Needs: Not on file   Physical Activity: Not on file  Stress: Not on file  Social Connections: Not on file    Tobacco Counseling Counseling given: Not Answered   Clinical Intake:  Pre-visit preparation completed: Yes  Pain : No/denies pain Pain Score: 0-No pain Faces Pain Scale: No hurt  Faces Pain Scale: No hurt  BMI - recorded: 24.22 Nutritional Status: BMI of 19-24  Normal Nutritional Risks: Unintentional weight  loss Diabetes: No  How often do you need to have someone help you when you read instructions, pamphlets, or other written materials from your doctor or pharmacy?: [place] Needed?: No      Activities of Daily Living In your present state of health, do you have any difficulty performing the following activities: 08/21/2021 06/01/2021  Hearing? N N  Vision? N N  Difficulty concentrating or making decisions? Tempie Donning  Walking or climbing stairs? Y Y  Dressing or bathing? Y Y  Doing errands, shopping? - Y  Preparing Food and eating ? Y -  Using the Toilet? Y -  In the past six months, have you accidently leaked urine? Y -  Do you have problems with loss of bowel control? Y -  Managing your Medications? Y -  Managing your Finances? Y -  Some recent data might be hidden    Patient Care Team: Seward Carol, MD as PCP - General (Internal Medicine)  Indicate any recent Medical Services you may have received from other than [facility] providers in the past year (date may be approximate).     Assessment:   This is a routine wellness examination for Vickie Mckee.  Hearing/Vision screen No results found.  Dietary issues and exercise activities discussed: Current Exercise Habits: The patient does not participate in regular exercise at present, Exercise limited by: None identified   Goals Addressed             This Visit's Progress    Absence of Fall and Fall-Related Injury       Evidence-based guidance:  Assess fall risk using a validated tool when  available. Consider balance and gait impairment, muscle weakness, diminished vision or hearing, environmental hazards, presence of urinary or bowel urgency and/or incontinence.  Communicate fall injury risk to interprofessional healthcare team.  Develop a fall prevention plan with the patient and family.  Promote use of personal vision and auditory aids.  Promote reorientation, appropriate sensory stimulation, and routines to decrease risk of fall when changes in mental status are present.  Assess assistance level required for safe and effective self-care; consider referral for home care.  Encourage physical activity, such as performance of self-care at highest level of ability, strength and balance exercise program, and provision of appropriate assistive devices; refer to rehabilitation therapy.  Refer to community-based fall prevention program where available.  If fall occurs, determine the cause and revise fall injury prevention plan.  Regularly review medication contribution to fall risk; consider risk related to polypharmacy and age.  Refer to pharmacist for consultation when concerns about medications are revealed.  Balance adequate pain management with potential for oversedation.  Provide guidance related to environmental modifications.  Consider supplementation with Vitamin D.   Notes:      Follow up with Primary Care Provider       General - Client will not be readmitted within 30 days ([facility])         Depression Screen PHQ 2/9 Scores 08/21/2021  PHQ - 2 Score 0    Fall Risk Fall Risk  08/21/2021  Falls in the past year? 1  Number falls in past yr: 1  Injury with Fall? 0  Risk for fall due to : History of fall(s);Impaired balance/gait;Impaired mobility  Follow up Falls evaluation completed    FALL RISK PREVENTION PERTAINING TO THE HOME:  Any stairs in or around the home? No  If so, are there any without handrails? No  Home free  of loose throw rugs in walkways, pet beds,  electrical cords, etc? Yes  Adequate lighting in your home to reduce risk of falls? Yes   ASSISTIVE DEVICES UTILIZED TO PREVENT FALLS:  Life alert? No  Use of a cane, walker or w/c? Yes  Grab bars in the bathroom? Yes  Shower chair or bench in shower? Yes  Elevated toilet seat or a handicapped toilet? Yes   TIMED UP AND GO:  Was the test performed? No .  Length of time to ambulate nonambulatory   Gait unsteady with use of assistive device, provider informed and education provided.   Cognitive Function: MMSE - Mini Mental State Exam 08/21/2021  Not completed: Unable to complete        Immunizations Immunization History  Administered Date(s) Administered   Fluad Quad(high Dose 65+) 07/26/2021   Influenza-Unspecified 07/20/2020   Moderna Covid-19 Vaccine Bivalent Booster 67yrs & up 08/15/2021   Moderna Sars-Covid-2 Vaccination 02/10/2020, 03/08/2020   Pneumococcal-Unspecified 09/11/2004, 03/09/2014   Tetanus 01/03/2009    TDAP status: Due, Education has been provided regarding the importance of this vaccine. Advised may receive this vaccine at local pharmacy or Health Dept. Aware to provide a copy of the vaccination record if obtained from local pharmacy or Health Dept. Verbalized acceptance and understanding.  Flu Vaccine status: Up to date  Pneumococcal vaccine status: Up to date  Covid-19 vaccine status: Completed vaccines  Qualifies for Shingles Vaccine? Yes   Zostavax completed No   Shingrix Completed?: No.    Education has been provided regarding the importance of this vaccine. Patient has been advised to call insurance company to determine out of pocket expense if they have not yet received this vaccine. Advised may also receive vaccine at local pharmacy or Health Dept. Verbalized acceptance and understanding.  Screening Tests Health Maintenance  Topic Date Due   TETANUS/TDAP  12/20/2021 (Originally 01/04/2019)   DEXA SCAN  02/21/2022 (Originally 06/22/1998)    Zoster Vaccines- Shingrix (1 of 2) 04/24/2022 (Originally 06/23/1983)   Pneumonia Vaccine 59+ Years old  Completed   INFLUENZA VACCINE  Completed   COVID-19 Vaccine  Completed   HPV VACCINES  Aged Out    Health Maintenance  There are no preventive care reminders to display for this patient.  Colorectal cancer screening: No longer required.   Mammogram status: No longer required due to advanced age .  Bone Density status: Ordered 08-21-21. Pt provided with contact info and advised to call to schedule appt.  Lung Cancer Screening: (Low Dose CT Chest recommended if Age 57-80 years, 30 pack-year currently smoking OR have quit w/in 15years.) does not qualify.   Lung Cancer Screening Referral: n/a  Additional Screening:  Hepatitis C Screening: does not qualify; Completed n/a   Vision Screening: Recommended annual ophthalmology exams for early detection of glaucoma and other disorders of the eye. Is the patient up to date with their annual eye exam?  Yes  Who is the provider or what is the name of the office in which the patient attends annual eye exams?  If pt is not established with a provider, would they like to be referred to a provider to establish care? No .   Dental Screening: Recommended annual dental exams for proper oral hygiene  Community Resource Referral / Chronic Care Management: CRR required this visit?  No   CCM required this visit?  No      Plan:     I have personally reviewed and noted the following in the patient's  chart:   Medical and social history Use of alcohol, tobacco or illicit drugs  Current medications and supplements including opioid prescriptions.  Functional ability and status Nutritional status Physical activity Advanced directives List of other physicians Hospitalizations, surgeries, and ER visits in previous 12 months Vitals Screenings to include cognitive, depression, and falls Referrals and appointments  In addition, I have reviewed  and discussed with patient certain preventive protocols, quality metrics, and best practice recommendations. A written personalized care plan for preventive services as well as general preventive health recommendations were provided to patient.     Gerlene Fee, NP   08/21/2021   Nurse Notes:

## 2021-08-21 NOTE — Patient Instructions (Signed)
   Ms. Yount , Thank you for taking time to come for your Medicare Wellness Visit. I appreciate your ongoing commitment to your health goals. Please review the following plan we discussed and let me know if I can assist you in the future.   These are the goals we discussed:  Goals      Absence of Fall and Fall-Related Injury     Evidence-based guidance:  Assess fall risk using a validated tool when available. Consider balance and gait impairment, muscle weakness, diminished vision or hearing, environmental hazards, presence of urinary or bowel urgency and/or incontinence.  Communicate fall injury risk to interprofessional healthcare team.  Develop a fall prevention plan with the patient and family.  Promote use of personal vision and auditory aids.  Promote reorientation, appropriate sensory stimulation, and routines to decrease risk of fall when changes in mental status are present.  Assess assistance level required for safe and effective self-care; consider referral for home care.  Encourage physical activity, such as performance of self-care at highest level of ability, strength and balance exercise program, and provision of appropriate assistive devices; refer to rehabilitation therapy.  Refer to community-based fall prevention program where available.  If fall occurs, determine the cause and revise fall injury prevention plan.  Regularly review medication contribution to fall risk; consider risk related to polypharmacy and age.  Refer to pharmacist for consultation when concerns about medications are revealed.  Balance adequate pain management with potential for oversedation.  Provide guidance related to environmental modifications.  Consider supplementation with Vitamin D.   Notes:      Follow up with Primary Care Provider     General - Client will not be readmitted within 30 days ([facility])        This is a list of the screening recommended for you and due dates:  Health  Maintenance  Topic Date Due   Tetanus Vaccine  12/20/2021*   DEXA scan (bone density measurement)  02/21/2022*   Zoster (Shingles) Vaccine (1 of 2) 04/24/2022*   Pneumonia Vaccine  Completed   Flu Shot  Completed   COVID-19 Vaccine  Completed   HPV Vaccine  Aged Out  *Topic was postponed. The date shown is not the original due date.

## 2021-08-22 DIAGNOSIS — I69354 Hemiplegia and hemiparesis following cerebral infarction affecting left non-dominant side: Secondary | ICD-10-CM | POA: Diagnosis not present

## 2021-08-22 DIAGNOSIS — R770 Abnormality of albumin: Secondary | ICD-10-CM | POA: Diagnosis not present

## 2021-08-22 DIAGNOSIS — F0150 Vascular dementia without behavioral disturbance: Secondary | ICD-10-CM | POA: Diagnosis present

## 2021-08-22 DIAGNOSIS — K219 Gastro-esophageal reflux disease without esophagitis: Secondary | ICD-10-CM | POA: Diagnosis not present

## 2021-08-22 DIAGNOSIS — R41841 Cognitive communication deficit: Secondary | ICD-10-CM | POA: Diagnosis not present

## 2021-08-22 DIAGNOSIS — Z741 Need for assistance with personal care: Secondary | ICD-10-CM | POA: Diagnosis not present

## 2021-08-22 DIAGNOSIS — G8194 Hemiplegia, unspecified affecting left nondominant side: Secondary | ICD-10-CM | POA: Diagnosis present

## 2021-08-22 DIAGNOSIS — H9190 Unspecified hearing loss, unspecified ear: Secondary | ICD-10-CM | POA: Diagnosis not present

## 2021-08-22 DIAGNOSIS — E559 Vitamin D deficiency, unspecified: Secondary | ICD-10-CM | POA: Diagnosis not present

## 2021-08-22 DIAGNOSIS — E876 Hypokalemia: Secondary | ICD-10-CM | POA: Diagnosis not present

## 2021-08-22 DIAGNOSIS — I63211 Cerebral infarction due to unspecified occlusion or stenosis of right vertebral arteries: Secondary | ICD-10-CM | POA: Diagnosis not present

## 2021-08-22 DIAGNOSIS — E785 Hyperlipidemia, unspecified: Secondary | ICD-10-CM | POA: Diagnosis not present

## 2021-08-22 DIAGNOSIS — N318 Other neuromuscular dysfunction of bladder: Secondary | ICD-10-CM | POA: Diagnosis present

## 2021-08-22 DIAGNOSIS — I6350 Cerebral infarction due to unspecified occlusion or stenosis of unspecified cerebral artery: Secondary | ICD-10-CM | POA: Diagnosis present

## 2021-08-22 DIAGNOSIS — E46 Unspecified protein-calorie malnutrition: Secondary | ICD-10-CM | POA: Diagnosis not present

## 2021-08-22 DIAGNOSIS — R262 Difficulty in walking, not elsewhere classified: Secondary | ICD-10-CM | POA: Diagnosis not present

## 2021-08-22 DIAGNOSIS — I69391 Dysphagia following cerebral infarction: Secondary | ICD-10-CM | POA: Diagnosis not present

## 2021-08-22 DIAGNOSIS — M6281 Muscle weakness (generalized): Secondary | ICD-10-CM | POA: Diagnosis not present

## 2021-08-22 DIAGNOSIS — F39 Unspecified mood [affective] disorder: Secondary | ICD-10-CM | POA: Diagnosis not present

## 2021-08-22 DIAGNOSIS — Q282 Arteriovenous malformation of cerebral vessels: Secondary | ICD-10-CM | POA: Diagnosis not present

## 2021-08-22 DIAGNOSIS — I10 Essential (primary) hypertension: Secondary | ICD-10-CM | POA: Diagnosis not present

## 2021-08-22 DIAGNOSIS — I639 Cerebral infarction, unspecified: Secondary | ICD-10-CM | POA: Diagnosis not present

## 2021-08-22 DIAGNOSIS — F32A Depression, unspecified: Secondary | ICD-10-CM | POA: Diagnosis not present

## 2021-08-22 DIAGNOSIS — I69322 Dysarthria following cerebral infarction: Secondary | ICD-10-CM | POA: Diagnosis not present

## 2021-08-22 DIAGNOSIS — F0390 Unspecified dementia without behavioral disturbance: Secondary | ICD-10-CM | POA: Diagnosis not present

## 2021-08-22 DIAGNOSIS — M545 Low back pain, unspecified: Secondary | ICD-10-CM | POA: Diagnosis not present

## 2021-08-22 DIAGNOSIS — I421 Obstructive hypertrophic cardiomyopathy: Secondary | ICD-10-CM | POA: Diagnosis not present

## 2021-08-24 ENCOUNTER — Encounter: Payer: Medicare Other | Attending: Physical Medicine & Rehabilitation | Admitting: Physical Medicine & Rehabilitation

## 2021-08-24 ENCOUNTER — Encounter: Payer: Self-pay | Admitting: Physical Medicine & Rehabilitation

## 2021-08-24 VITALS — BP 135/81 | HR 92 | Temp 98.4°F | Ht 62.0 in | Wt 134.6 lb

## 2021-08-24 DIAGNOSIS — G8194 Hemiplegia, unspecified affecting left nondominant side: Secondary | ICD-10-CM | POA: Diagnosis not present

## 2021-08-24 DIAGNOSIS — F0150 Vascular dementia without behavioral disturbance: Secondary | ICD-10-CM | POA: Diagnosis not present

## 2021-08-24 DIAGNOSIS — I639 Cerebral infarction, unspecified: Secondary | ICD-10-CM | POA: Diagnosis not present

## 2021-08-24 DIAGNOSIS — I6350 Cerebral infarction due to unspecified occlusion or stenosis of unspecified cerebral artery: Secondary | ICD-10-CM | POA: Diagnosis not present

## 2021-08-24 DIAGNOSIS — N318 Other neuromuscular dysfunction of bladder: Secondary | ICD-10-CM | POA: Diagnosis not present

## 2021-08-24 NOTE — Patient Instructions (Signed)
Toilet patient every 2 -3 hrs when awake

## 2021-08-24 NOTE — Progress Notes (Signed)
Subjective:    Patient ID: Vickie Mckee, female    DOB: 28-Jun-1931, 87 y.o.   MRN: [id number] 87 y.o. right-handed female with history of hypertension hypothyroidism chronic low back pain hypertrophic obstructive cardiomyopathy some memory loss maintained on Aricept.  Per chart review lives alone independent with assistive device.  Two-level home bed and bath main level with ramped entrance.  She has family and friends to assist her as needed.  Presented to [facility] 05/29/2021 with acute onset of left-sided weakness and dysarthria as well as left lateral gaze and 1 episode of vomiting.  Cranial CT scan negative for acute changes.  CT angiogram of head and neck showed no large vessel occlusion or high-grade stenosis.  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  Patient did not receive tPA.  MRI showed acute right pontine infarction.  Multiple small remote bilateral cerebellar lacunar infarcts.  Echocardiogram with ejection fraction of 60 to 65% no wall motion abnormalities.  EEG negative for seizure.  Admission chemistries alcohol negative potassium 2.6 urine drug screen negative.  Maintained on aspirin 81 mg daily and Plavix 75 mg daily for CVA prophylaxis x1 month then anticipated monotherapy.  Subcutaneous Lovenox for DVT prophylaxis.  Dysphagia #1 nectar thick liquid diet.  Therapy evaluations completed due to patient's left-sided weakness and dysarthria was admitted for a comprehensive rehab program.  Admit date: 06/01/2021 Discharge date: 06/30/2021  HPI Patient accompanied by 2 daughters.  Patient has no complaints.  She is in a wheelchair.  At [facility] center for SNF  Still receiving PT, OT through next week. Family states that the patient will stay at the [facility] after PT and OT are completed.  Several falls, tries to go to [facility] by herself at night, no injury    Treatment for epistaxis, cauterization per ENT    Pain Inventory Average Pain 0 Pain Right Now 0 My pain is intermittent  LOCATION OF PAIN  legs  BOWEL Number of stools per week: 7 plus Oral laxative use Yes  Type of laxative Miralax  BLADDER Normal and Pads    Mobility ability to climb steps?  no do you drive?  no use a wheelchair needs help with transfers Do you have any goals in this area?  yes  Function retired I need assistance with the following:  dressing, bathing, toileting, meal prep, household duties, and shopping Do you have any goals in this area?  yes  Neuro/Psych weakness numbness trouble walking confusion anxiety loss of taste or smell  Prior Studies Any changes since last visit?  no, New Patient  Physicians involved in your care Any changes since last visit?  no, New Patient   Family History  Problem Relation Age of Onset   Breast cancer Mother    Hypertension Father    CVA Father    Heart failure Sister    CAD Sister    Kidney disease Sister    COPD Brother    Lung cancer Brother    CAD Brother    Diabetes Brother    Stroke Father    Heart attack Neg Hx    Social History   Socioeconomic History   Marital status: Married    Spouse name: Not on file   Number of children: Not on file   Years of education: Not on file   Highest education level: Not on  file  Occupational History   Not on file  Tobacco Use   Smoking status: Never   Smokeless tobacco: Never  Vaping Use   Vaping Use: Never used  Substance and Sexual Activity   Alcohol use: No    Alcohol/week: 0.0 standard drinks   Drug use: Never   Sexual activity: Not on file  Other Topics Concern   Not on file  Social History Narrative   Not on file   Social Determinants of Health   Financial Resource Strain: Not on file  Food Insecurity: Not on file  Transportation Needs: Not on file  Physical Activity: Not on file  Stress: Not on file  Social Connections:  Not on file   Past Surgical History:  Procedure Laterality Date   ABDOMINAL HYSTERECTOMY     BREAST BIOPSY     x2   CATARACT EXTRACTION     removal with IOL bilateral   GLAUCOMA SURGERY     laser   skin cancer removal     THYROIDECTOMY     subtotal   Past Medical History:  Diagnosis Date   Decreased sense of smell    and taste-negative CT scan-2011   Depression    Essential hypertension    GERD (gastroesophageal reflux disease)    Heart murmur    heard first time 4/12   History of mammogram    2010 and she does not want to repeat them anymore   HOCM (hypertrophic obstructive cardiomyopathy) ([place])    a. Dx by echo 05/2014.   Hypothyroidism    IBS (irritable bowel syndrome)    Impaired fasting glucose    Low back pain    Lumbar radiculopathy    with negative MRI (1991)   Postmenopausal    with osteopenia, bisphosphonates 1/05-1/11, improved osteopenia 4/12-repeat planned late 2015   Shingles    2015   Thyroid nodule    Vitamin D deficiency    repleted on weekly vitamin D 50000 iu   BP 135/81   Pulse 92   Temp 98.4 F (36.9 C)   Ht 5\' 2"  (1.575 m)   Wt 134 lb 9.6 oz (61.1 kg)   SpO2 94%   BMI 24.62 kg/m   Opioid Risk Score:   Fall Risk Score:  `1  Depression screen PHQ 2/9  Depression screen [facility] 2/9 08/24/2021 08/21/2021  Decreased Interest 0 0  Down, Depressed, Hopeless 0 0  PHQ - 2 Score 0 0  Altered sleeping 0 -  Tired, decreased energy 0 -  Change in appetite 0 -  Feeling bad or failure about yourself  0 -  Trouble concentrating 0 -  Moving slowly or fidgety/restless 0 -  Suicidal thoughts 0 -  PHQ-9 Score 0 -    Review of Systems  Musculoskeletal:  Positive for back pain, gait problem and neck pain.  Neurological:  Positive for weakness and numbness.  Psychiatric/Behavioral:         Anxiety  All other systems reviewed and are negative.     Objective:   Physical Exam Vitals and nursing note reviewed.  Constitutional:      Appearance:  She is normal weight.  HENT:     Head: Normocephalic and atraumatic.  Eyes:     Extraocular Movements: Extraocular movements intact.     Conjunctiva/sclera: Conjunctivae normal.     Pupils: Pupils are equal, round, and reactive to light.  Cardiovascular:     Rate and Rhythm: Normal rate and regular rhythm.  Heart sounds: Normal heart sounds.  Pulmonary:     Effort: Pulmonary effort is normal. No respiratory distress.     Breath sounds: Normal breath sounds.  Abdominal:     General: Abdomen is flat. Bowel sounds are normal. There is no distension.     Palpations: Abdomen is soft.  Musculoskeletal:        General: Deformity present.     Comments: Left shoulder has 1 fingerbreadth subluxation.  Pain with external rotation as well as abduction.  No pain with elbow wrist or hand range of motion in the left upper extremity no swelling in the left hand.  She does have arthritic changes at the PIP and DIPs on the right and left side. Left lower extremity has no pain with motion Right upper extremity right lower extremity have no pain with range of motion and no limitation.  Skin:    General: Skin is warm and dry.  Neurological:     Mental Status: She is alert and oriented to person, place, and time.     Comments: Motor strength is 5/5 in the right deltoid bicep tricep grip as well as right hip flexor knee extensor ankle dorsiflexor Left upper extremity has 0 at the deltoid trace elbow flexion to minus elbow extension to minus finger flexion 0 finger extension 0 at the wrist flexors and extensors Left lower limb has 0/5 in hip flexor 3 - knee extensor and ankle dorsiflexor although ankle dorsiflexor is not consistently activated. Sensation intact to light touch bilateral upper and lower limbs. Speech without dysarthria, no evidence of aphasia Orientation is to person and city but not time.  Psychiatric:        Mood and Affect: Mood normal.        Behavior: Behavior normal.   Upon D/C  from rehab Right lower extremity 5 -/5 Left upper extremity 0/5 Left lower extremity hip flexion 1/5 knee extension knee flexion 0/5 dorsi plantarflexion 4 -/5 Neurologic.  Alert severely dysarthric follows commands      Assessment & Plan:  1.  Right pontine infarct with left hemiparesis severe in the left upper extremity and moderate in the lower extremity.  Sensation is intact Recommend to continue PT OT, discussed with daughters timeframe of recovery which is greatest in the first month and some moderate improvements over the first 3 months.  The patient has had some improvement in left lower limb strength and minimal in the left upper limb. 2.  Spastic bladder post stroke timed toileting program, consider anticholinergic if above not helpful 3.  Cognitive deficits related to stroke, vascular dementia, frequent reorientation avoid opioids or benzodiazepines which may worsen symptoms

## 2021-09-05 ENCOUNTER — Encounter: Payer: Self-pay | Admitting: Adult Health

## 2021-09-05 ENCOUNTER — Non-Acute Institutional Stay (SKILLED_NURSING_FACILITY): Payer: Medicare Other | Admitting: Adult Health

## 2021-09-05 DIAGNOSIS — E782 Mixed hyperlipidemia: Secondary | ICD-10-CM

## 2021-09-05 DIAGNOSIS — E890 Postprocedural hypothyroidism: Secondary | ICD-10-CM

## 2021-09-05 DIAGNOSIS — F339 Major depressive disorder, recurrent, unspecified: Secondary | ICD-10-CM

## 2021-09-05 DIAGNOSIS — F0390 Unspecified dementia without behavioral disturbance: Secondary | ICD-10-CM | POA: Diagnosis not present

## 2021-09-05 NOTE — Progress Notes (Signed)
Location:  [place] Room Number: 138-P Place of Service:  SNF (31)   CODE STATUS: DNR  Allergies  Allergen Reactions   Hydrochlorothiazide     05/29/2021 K+ 2.6 Other reaction(s): Other (See Comments) 05/29/2021 K+ 2.6   Codeine Nausea Only    Chief Complaint  Patient presents with   Medical Management of Chronic Issues            Postoperative hypothyroidism:  Mixed hyperlipidemia: Psychosis in elderly without behavioral disturbance: Depression recurrent    HPI:  She is a 87 year old long term resident of this facility being seen for the management of her chronic illnesses: Postoperative hypothyroidism:  Mixed hyperlipidemia: Psychosis in elderly without behavioral disturbance: Depression recurrent. There are no reports of uncontrolled pain. No reports of yelling out or other behavioral disturbance. Her weight is stable.   Past Medical History:  Diagnosis Date   Decreased sense of smell    and taste-negative CT scan-2011   Depression    Essential hypertension    GERD (gastroesophageal reflux disease)    Heart murmur    heard first time 4/12   History of mammogram    2010 and she does not want to repeat them anymore   HOCM (hypertrophic obstructive cardiomyopathy) ([place])    a. Dx by echo 05/2014.   Hypothyroidism    IBS (irritable bowel syndrome)    Impaired fasting glucose    Low back pain    Lumbar radiculopathy    with negative MRI (1991)   Postmenopausal    with osteopenia, bisphosphonates 1/05-1/11, improved osteopenia 4/12-repeat planned late 2015   Shingles    2015   Thyroid nodule    Vitamin D deficiency    repleted on weekly vitamin D 50000 iu    Past Surgical History:  Procedure Laterality Date   ABDOMINAL HYSTERECTOMY     BREAST BIOPSY     x2   CATARACT EXTRACTION     removal with IOL bilateral   GLAUCOMA SURGERY     laser   skin cancer removal     THYROIDECTOMY     subtotal    Social History   Socioeconomic History    Marital status: Married    Spouse name: Not on file   Number of children: Not on file   Years of education: Not on file   Highest education level: Not on file  Occupational History   Not on file  Tobacco Use   Smoking status: Never   Smokeless tobacco: Never  Vaping Use   Vaping Use: Never used  Substance and Sexual Activity   Alcohol use: No    Alcohol/week: 0.0 standard drinks   Drug use: Never   Sexual activity: Not on file  Other Topics Concern   Not on file  Social History Narrative   Not on file   Social Determinants of Health   Financial Resource Strain: Not on file  Food Insecurity: Not on file  Transportation Needs: Not on file  Physical Activity: Not on file  Stress: Not on file  Social Connections: Not on file  Intimate Partner Violence: Not on file   Family History  Problem Relation Age of Onset   Breast cancer Mother    Hypertension Father    CVA Father    Heart failure Sister    CAD Sister    Kidney disease Sister    COPD Brother    Lung cancer Brother    CAD Brother  Diabetes Brother    Stroke Father    Heart attack Neg Hx       VITAL SIGNS BP 107/63   Pulse 93   Temp 98.2 F (36.8 C)   Resp 20   Ht 5\' 2"  (1.575 m)   Wt 134 lb 9.6 oz (61.1 kg)   SpO2 94%   BMI 24.62 kg/m   Outpatient Encounter Medications as of 09/05/2021  Medication Sig   acetaminophen (TYLENOL) 325 MG tablet Take 2 tablets (650 mg total) by mouth every 4 (four) hours as needed for mild pain (or temp > 37.5 C (99.5 F)).   amantadine (SYMMETREL) 50 MG/5ML solution Take 50 mg by mouth 2 (two) times daily. With breakfast and lunch   Amino Acids-Protein Hydrolys (FEEDING SUPPLEMENT, PRO-STAT SUGAR FREE 64,) LIQD Take 30 mLs by mouth 3 (three) times daily with meals. for albumin 2.8   aspirin EC 81 MG EC tablet Take 1 tablet (81 mg total) by mouth daily with breakfast. Swallow whole.   atorvastatin (LIPITOR) 40 MG tablet Take 1 tablet (40 mg total) by mouth daily.    Cholecalciferol (VITAMIN D-3) 25 MCG (1000 UT) CAPS Take 1 capsule by mouth daily.   donepezil (ARICEPT) 5 MG tablet Take 1 tablet (5 mg total) by mouth at bedtime.   famotidine (PEPCID) 20 MG tablet Take 1 tablet (20 mg total) by mouth daily.   levothyroxine (SYNTHROID, LEVOTHROID) 75 MCG tablet Take 75 mcg by mouth daily before breakfast.    memantine (NAMENDA XR) 21 MG CP24 24 hr capsule Take 21 mg by mouth daily.   Menthol, Topical Analgesic, (BIOFREEZE) 4 % GEL Apply topically as needed. Left shoulder pain   NON FORMULARY Diet:  Regular, thin liquids   oxymetazoline (AFRIN) 0.05 % nasal spray Place 1 spray into both nostrils as needed. epistaxis   polyethylene glycol (MIRALAX / GLYCOLAX) 17 g packet Take 17 g by mouth daily.   pyridOXINE (VITAMIN B-6) 25 MG tablet Take 25 mg by mouth daily.   QUEtiapine (SEROQUEL) 25 MG tablet Take 0.5 tablets (12.5 mg total) by mouth daily after supper.   sertraline (ZOLOFT) 100 MG tablet Take 100 mg by mouth daily. At 9 am along with other dose totaling 125 mg day   sertraline (ZOLOFT) 25 MG tablet Take 25 mg by mouth daily. At 9 am along with other dose   sodium chloride (OCEAN) 0.65 % SOLN nasal spray Place 1 spray into both nostrils 4 (four) times daily. And as needed   vitamin C (ASCORBIC ACID) 500 MG tablet Take 500 mg by mouth daily.   [DISCONTINUED] sertraline (ZOLOFT) 100 MG tablet Take 1 tablet (100 mg total) by mouth daily. (Patient taking differently: Take 100 mg by mouth daily. @ 9 am)   [DISCONTINUED] memantine (NAMENDA XR) 14 MG CP24 24 hr capsule Take 14 mg by mouth daily. [DX: Vascular dementia, unspecified severity, without behavioral disturbance, psychotic disturbance, mood disturbance, and anxiety]   [DISCONTINUED] mupirocin ointment (BACTROBAN) 2 % Apply to left nostril 3 times daily   No facility-administered encounter medications on file as of 09/05/2021.     SIGNIFICANT DIAGNOSTIC EXAMS   PREVIOUS   05-29-21: ct of head code  stroke:  No evidence of acute intracranial abnormality. ASPECTS is 10. Mild generalized cerebral atrophy. Paranasal sinus disease at the imaged levels, as described.  05-29-21: ct angio of head and neck:  CTA neck: 1. The common carotid and internal carotid arteries are patent within the neck without stenosis.  Mild atherosclerotic plaque within both carotid systems within the neck, as described. 2. Vertebral arteries patent within the neck. Mild-to-moderate stenosis within the distal V2 segment of the non-dominant left vertebral artery. 3. Mild nonspecific fusiform dilation of the distal cervical left ICA to 7 mm. CTA head: 1. No intracranial large vessel occlusion or proximal high-grade arterial stenosis identified. 2. Intracranial atherosclerotic disease, as described. 3. 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 At least one branch vessel arises from this aneurysm. 4. 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  05-29-21: MRI of brain:  1. Acute right pontine infarct. Mild associated edema without mass effect. 2. Multiple small remote bilateral cerebellar lacunar infarcts. 3. Mild for age chronic microvascular ischemic disease and moderate atrophy.  05-29-21: 2-d echo:  1. No SAM noted mid/apical cavity obliteration in systole with some flow  acceleration peak velocity in 41m/sec range . Left ventricular ejection  fraction, by estimation, is 60 to 65%. The left ventricle has normal  function. The left ventricle has no  regional wall motion abnormalities. There is severe left ventricular  hypertrophy. Left ventricular diastolic parameters are consistent with  Grade I diastolic dysfunction (impaired relaxation).   05-30-21: EEG This study is suggestive of mild diffuse encephalopathy, nonspecific etiology. No seizures or epileptiform discharges were seen throughout the recording.   NO NEW EXAMS  LABS REVIEWED PREVIOUS   05-29-21: wbc  21.3; hgb 15.6; hct 45.5; mcv 87.2 plt 317; glucose 137; bun 18; creat 0.90; k+ 2.6; na++ 136; ca 8.9; GFR>60; liver normal albumin 4.2; mag 1.8; tsh 2.093; vit B 12: 584 vit D 36.20 05-30-21: hgb a1c 5.7; chol 203; ldl 127; trig 196 hdl 37 06-16-21: wbc 10.7; hb 13.4; hct 40.3; mcv 90.6 plt 250; glucose 105; bun 33; creat 1.03; k+ 4.2; na++ 137; ca 9.1; GFR 53 06-26-21: wbc 7.5; hgb 12.7; hct 38.2; mcv 90.7 plt 22; glucose 112; bun 29; creat 1.07; k+ 3.9; na++ 139; ca 9,4; GFR 50  06-27-21: urine culture: 20,000 proteus mirabilis   TODAY.  07-12-21: wbc 14.9; hgb 14.4; hct 42.8; mcv 93.0 plt 271; glucose 128; bun 27; creat 0.95; k+ 3.2; na++ 139; ca 9.1; GFR 58; liver normal albumin 3.6 07-28-21: wbc 9.5; hgb 12.8; hct 38.4; mcv 993.7 plt 237; glucose 102; bun 25; creat 0.81; k+ 3.3; na++ 139; ca 8.2 GFR>60; liver normal albumin 3.2; tsh 2.457 vit B 12: 261; folate 9.7    Review of Systems  Constitutional:  Negative for malaise/fatigue.  Respiratory:  Negative for cough and shortness of breath.   Cardiovascular:  Negative for chest pain, palpitations and leg swelling.  Gastrointestinal:  Negative for abdominal pain, constipation and heartburn.  Musculoskeletal:  Negative for back pain, joint pain and myalgias.  Skin: Negative.   Neurological:  Negative for dizziness.  Psychiatric/Behavioral:  The patient is not nervous/anxious.     Physical Exam Constitutional:      General: She is not in acute distress.    Appearance: She is well-developed. She is not diaphoretic.  Neck:     Thyroid: No thyromegaly.  Cardiovascular:     Rate and Rhythm: Normal rate and regular rhythm.     Pulses: Normal pulses.     Heart sounds: Normal heart sounds.  Pulmonary:     Effort: Pulmonary effort is normal. No respiratory distress.     Breath sounds: Normal breath sounds.  Abdominal:     General: Bowel sounds are normal.  There is no distension.     Palpations: Abdomen is soft.     Tenderness: There is no  abdominal tenderness.  Musculoskeletal:     Cervical back: Neck supple.     Right lower leg: No edema.     Left lower leg: No edema.     Comments: Left hemiplegia   Lymphadenopathy:     Cervical: No cervical adenopathy.  Skin:    General: Skin is warm and dry.  Neurological:     Mental Status: She is alert. Mental status is at baseline.  Psychiatric:        Mood and Affect: Mood normal.     ASSESSMENT/ PLAN:  TODAY  Postoperative hypothyroidism: is stable tsh 2.457 will continue synthroid 75 mcg daily   2. Mixed hyperlipidemia: is stable LDL 127; trig 196 will continue lipitor 40 mg daily   3. Psychosis in elderly without behavioral disturbance: is stable will continue seroquel 12.5 mg daily   4. Depression recurrent is stable will continue zoloft 125 mg daily    PREVIOUS   5. GERD without esophagitis; is stable will continue pepcid 20 mg daily   6. Chronic constipation: is stable will continue miralax daily   7. Thoracic aortic atherosclerosis (cxr 01-13-15) is stable   8. Ischemic stroke/acute right pontine stroke/left hemiplegia: is stable will continue asa 81 mg daily   9. Somnolence: post stroke: is stable will continue amantadine 50 mg twice daily will monitor   10.  HOCM (hypertrophic obstructive cardiomyopathy) is without change in status; will monitor her status.   11. Dysphagia post stroke: no signs of aspiration present is on thin liquids  12. Stage 3a chronic kidney disease: is stable bun 25; creat 0.81; GFR >60  13. Vascular dementia without behavioral disturbance: weight is 134 pounds; will continue aricept 5 mg daily and namenda xr 21 mg daily   14. Hypoalbuminemia due to protein calorie malnutrition: albumin 3.2 will continue supplements as indicated.   15. Vitamin B 12 deficiency: is without change level is 261 will begin vitamin B 12 2,000 mcg daily     Ok Edwards NP [facility]  Contact [phone] Monday through Friday 8am-  5pm  After hours call [phone]

## 2021-09-07 ENCOUNTER — Other Ambulatory Visit (HOSPITAL_COMMUNITY)
Admission: RE | Admit: 2021-09-07 | Discharge: 2021-09-07 | Disposition: A | Discharge status: Home / Self Care | Payer: Medicare Other | Source: Skilled Nursing Facility | Attending: Adult Health | Admitting: Adult Health

## 2021-09-07 DIAGNOSIS — E876 Hypokalemia: Secondary | ICD-10-CM | POA: Diagnosis not present

## 2021-09-07 LAB — POTASSIUM: Potassium: 3.5 mmol/L (ref 3.5–5.1)

## 2021-09-20 ENCOUNTER — Inpatient Hospital Stay: Payer: Self-pay | Admitting: Neurology

## 2021-09-26 DIAGNOSIS — I69354 Hemiplegia and hemiparesis following cerebral infarction affecting left non-dominant side: Secondary | ICD-10-CM | POA: Diagnosis not present

## 2021-09-26 DIAGNOSIS — Z9181 History of falling: Secondary | ICD-10-CM | POA: Diagnosis not present

## 2021-09-26 DIAGNOSIS — M6281 Muscle weakness (generalized): Secondary | ICD-10-CM | POA: Diagnosis not present

## 2021-09-26 DIAGNOSIS — R279 Unspecified lack of coordination: Secondary | ICD-10-CM | POA: Diagnosis not present

## 2021-09-27 DIAGNOSIS — I69354 Hemiplegia and hemiparesis following cerebral infarction affecting left non-dominant side: Secondary | ICD-10-CM | POA: Diagnosis not present

## 2021-09-27 DIAGNOSIS — Z9181 History of falling: Secondary | ICD-10-CM | POA: Diagnosis not present

## 2021-09-27 DIAGNOSIS — R279 Unspecified lack of coordination: Secondary | ICD-10-CM | POA: Diagnosis not present

## 2021-09-27 DIAGNOSIS — M6281 Muscle weakness (generalized): Secondary | ICD-10-CM | POA: Diagnosis not present

## 2021-09-28 DIAGNOSIS — M6281 Muscle weakness (generalized): Secondary | ICD-10-CM | POA: Diagnosis not present

## 2021-09-28 DIAGNOSIS — Z9181 History of falling: Secondary | ICD-10-CM | POA: Diagnosis not present

## 2021-09-28 DIAGNOSIS — I69354 Hemiplegia and hemiparesis following cerebral infarction affecting left non-dominant side: Secondary | ICD-10-CM | POA: Diagnosis not present

## 2021-09-28 DIAGNOSIS — R279 Unspecified lack of coordination: Secondary | ICD-10-CM | POA: Diagnosis not present

## 2021-09-29 DIAGNOSIS — R279 Unspecified lack of coordination: Secondary | ICD-10-CM | POA: Diagnosis not present

## 2021-09-29 DIAGNOSIS — I69354 Hemiplegia and hemiparesis following cerebral infarction affecting left non-dominant side: Secondary | ICD-10-CM | POA: Diagnosis not present

## 2021-09-29 DIAGNOSIS — M6281 Muscle weakness (generalized): Secondary | ICD-10-CM | POA: Diagnosis not present

## 2021-09-29 DIAGNOSIS — Z9181 History of falling: Secondary | ICD-10-CM | POA: Diagnosis not present

## 2021-10-02 ENCOUNTER — Non-Acute Institutional Stay (SKILLED_NURSING_FACILITY): Payer: Medicare Other | Admitting: Internal Medicine

## 2021-10-02 ENCOUNTER — Encounter: Payer: Self-pay | Admitting: Internal Medicine

## 2021-10-02 DIAGNOSIS — I10 Essential (primary) hypertension: Secondary | ICD-10-CM | POA: Diagnosis not present

## 2021-10-02 DIAGNOSIS — I69354 Hemiplegia and hemiparesis following cerebral infarction affecting left non-dominant side: Secondary | ICD-10-CM | POA: Diagnosis not present

## 2021-10-02 DIAGNOSIS — E890 Postprocedural hypothyroidism: Secondary | ICD-10-CM | POA: Diagnosis not present

## 2021-10-02 DIAGNOSIS — E782 Mixed hyperlipidemia: Secondary | ICD-10-CM | POA: Diagnosis not present

## 2021-10-02 DIAGNOSIS — Z9181 History of falling: Secondary | ICD-10-CM | POA: Diagnosis not present

## 2021-10-02 DIAGNOSIS — M6281 Muscle weakness (generalized): Secondary | ICD-10-CM | POA: Diagnosis not present

## 2021-10-02 DIAGNOSIS — R279 Unspecified lack of coordination: Secondary | ICD-10-CM | POA: Diagnosis not present

## 2021-10-02 NOTE — Assessment & Plan Note (Signed)
With history of stroke LDL goal would be less than 70.  Current level was 127.  Changing the statin to rosuvastatin will be discussed with [facility] NP.

## 2021-10-02 NOTE — Progress Notes (Signed)
NURSING HOME LOCATION: [facility] ROOM NUMBER:  [id number]  CODE STATUS:  DNR  PCP:  Seward Carol MD  This is a nursing facility follow up visit of chronic medical diagnoses & to document compliance with Regulation 483.30 (c) in The [place] Manual Phase 2 which mandates caregiver visit ( visits can alternate among physician, PA or NP as per statutes) within 10 days of 30 days / 60 days/ 90 days post admission to SNF date    Interim medical record and care since last SNF visit was updated with review of diagnostic studies and change in clinical status since last visit were documented.  HPI: She has been a resident of this facility since 06/30/2021.  She been hospitalized 8/11-9/9 with right pontine CVA complicated by dysphagia.  Other medical diagnoses include essential hypertension, dyslipidemia, hypothyroidism, GERD, history of hypertrophic obstructive cardiomyopathy, and IBS.  Labs are sufficiently current.  She has had mild hyperglycemia.  Minor prerenal azotemia is present with a BUN of 25.  Creatinine is 0.81 with GFR greater than 60 indicating CKD stage II.  Mild protein/caloric malnutrition was present with an albumin of 3.2 and total protein of 6.2.  Despite high-dose statin LDL is not at goal at 127.  CBC is normal; B12 level is low normal.  TSH is therapeutic at 2.457.  Review of systems: Dementia invalidated responses.  She denies any active complaints stating "I am all right".  Constitutional: No fever, significant weight change, fatigue  Eyes: No redness, discharge, pain, vision change ENT/mouth: No nasal congestion,  purulent discharge, earache, change in hearing, sore throat  Cardiovascular: No chest pain, palpitations, paroxysmal nocturnal dyspnea, claudication, edema  Respiratory: No cough, sputum production, hemoptysis, DOE, significant snoring, apnea   Gastrointestinal: No heartburn, dysphagia, abdominal pain, nausea /vomiting, rectal bleeding,  melena, change in bowels Genitourinary: No dysuria, hematuria, pyuria, incontinence, nocturia Musculoskeletal: No joint stiffness, joint swelling, weakness, pain Dermatologic: No rash, pruritus, change in appearance of skin Neurologic: No dizziness, headache, syncope, seizures, numbness, tingling Psychiatric: No significant anxiety, depression, insomnia, anorexia Endocrine: No change in hair/skin/nails, excessive thirst, excessive hunger, excessive urination  Hematologic/lymphatic: No significant bruising, lymphadenopathy, abnormal bleeding Allergy/immunology: No itchy/watery eyes, significant sneezing, urticaria, angioedema  Physical exam:  Pertinent or positive findings: She was sitting in the wheelchair with the left upper extremity in a sling.  Heart sounds are slightly distant and breath sounds decreased.  Pedal pulses are decreased.  There is 1/2+ edema at the sock line.Interosseous wasting is present.   General appearance: Adequately nourished; no acute distress, increased work of breathing is present.   Lymphatic: No lymphadenopathy about the head, neck, axilla. Eyes: No conjunctival inflammation or lid edema is present. There is no scleral icterus. Ears:  External ear exam shows no significant lesions or deformities.   Nose:  External nasal examination shows no deformity or inflammation. Nasal mucosa are pink and moist without lesions, exudates Oral exam:  Lips and gums are healthy appearing. There is no oropharyngeal erythema or exudate. Neck:  No thyromegaly, masses, tenderness noted.    Heart:  No gallop, murmur, click, rub .  Lungs:  without wheezes, rhonchi, rales, rubs. Abdomen: Bowel sounds are normal. Abdomen is soft and nontender with no organomegaly, hernias, masses. GU: Deferred  Extremities:  No cyanosis, clubbing Neurologic exam :Balance, Rhomberg, finger to nose testing could not be completed due to clinical state Skin: Warm & dry w/o tenting. No significant lesions  or rash.  See summary  under each active problem in the Problem List with associated updated therapeutic plan

## 2021-10-02 NOTE — Patient Instructions (Signed)
See assessment and plan under each diagnosis in the problem list and acutely for this visit 

## 2021-10-02 NOTE — Assessment & Plan Note (Signed)
Systolic blood pressure is minimally elevated.  If the systolic is persistently significantly elevated above 140; antihypertensive regimen will be adjusted.

## 2021-10-02 NOTE — Assessment & Plan Note (Signed)
TSH is therapeutic; no change indicated. 

## 2021-10-03 DIAGNOSIS — R279 Unspecified lack of coordination: Secondary | ICD-10-CM | POA: Diagnosis not present

## 2021-10-03 DIAGNOSIS — M6281 Muscle weakness (generalized): Secondary | ICD-10-CM | POA: Diagnosis not present

## 2021-10-03 DIAGNOSIS — Z9181 History of falling: Secondary | ICD-10-CM | POA: Diagnosis not present

## 2021-10-03 DIAGNOSIS — I69354 Hemiplegia and hemiparesis following cerebral infarction affecting left non-dominant side: Secondary | ICD-10-CM | POA: Diagnosis not present

## 2021-10-04 DIAGNOSIS — I69354 Hemiplegia and hemiparesis following cerebral infarction affecting left non-dominant side: Secondary | ICD-10-CM | POA: Diagnosis not present

## 2021-10-04 DIAGNOSIS — Z9181 History of falling: Secondary | ICD-10-CM | POA: Diagnosis not present

## 2021-10-04 DIAGNOSIS — M6281 Muscle weakness (generalized): Secondary | ICD-10-CM | POA: Diagnosis not present

## 2021-10-04 DIAGNOSIS — R279 Unspecified lack of coordination: Secondary | ICD-10-CM | POA: Diagnosis not present

## 2021-10-05 DIAGNOSIS — R279 Unspecified lack of coordination: Secondary | ICD-10-CM | POA: Diagnosis not present

## 2021-10-05 DIAGNOSIS — I69354 Hemiplegia and hemiparesis following cerebral infarction affecting left non-dominant side: Secondary | ICD-10-CM | POA: Diagnosis not present

## 2021-10-05 DIAGNOSIS — Z9181 History of falling: Secondary | ICD-10-CM | POA: Diagnosis not present

## 2021-10-05 DIAGNOSIS — M6281 Muscle weakness (generalized): Secondary | ICD-10-CM | POA: Diagnosis not present

## 2021-10-06 DIAGNOSIS — M6281 Muscle weakness (generalized): Secondary | ICD-10-CM | POA: Diagnosis not present

## 2021-10-06 DIAGNOSIS — R279 Unspecified lack of coordination: Secondary | ICD-10-CM | POA: Diagnosis not present

## 2021-10-06 DIAGNOSIS — I69354 Hemiplegia and hemiparesis following cerebral infarction affecting left non-dominant side: Secondary | ICD-10-CM | POA: Diagnosis not present

## 2021-10-06 DIAGNOSIS — Z9181 History of falling: Secondary | ICD-10-CM | POA: Diagnosis not present

## 2021-10-09 DIAGNOSIS — Z9181 History of falling: Secondary | ICD-10-CM | POA: Diagnosis not present

## 2021-10-09 DIAGNOSIS — I69354 Hemiplegia and hemiparesis following cerebral infarction affecting left non-dominant side: Secondary | ICD-10-CM | POA: Diagnosis not present

## 2021-10-09 DIAGNOSIS — M6281 Muscle weakness (generalized): Secondary | ICD-10-CM | POA: Diagnosis not present

## 2021-10-09 DIAGNOSIS — R279 Unspecified lack of coordination: Secondary | ICD-10-CM | POA: Diagnosis not present

## 2021-10-10 DIAGNOSIS — I69354 Hemiplegia and hemiparesis following cerebral infarction affecting left non-dominant side: Secondary | ICD-10-CM | POA: Diagnosis not present

## 2021-10-10 DIAGNOSIS — Z9181 History of falling: Secondary | ICD-10-CM | POA: Diagnosis not present

## 2021-10-10 DIAGNOSIS — M6281 Muscle weakness (generalized): Secondary | ICD-10-CM | POA: Diagnosis not present

## 2021-10-10 DIAGNOSIS — R279 Unspecified lack of coordination: Secondary | ICD-10-CM | POA: Diagnosis not present

## 2021-10-11 DIAGNOSIS — I69354 Hemiplegia and hemiparesis following cerebral infarction affecting left non-dominant side: Secondary | ICD-10-CM | POA: Diagnosis not present

## 2021-10-11 DIAGNOSIS — R279 Unspecified lack of coordination: Secondary | ICD-10-CM | POA: Diagnosis not present

## 2021-10-11 DIAGNOSIS — Z9181 History of falling: Secondary | ICD-10-CM | POA: Diagnosis not present

## 2021-10-11 DIAGNOSIS — M6281 Muscle weakness (generalized): Secondary | ICD-10-CM | POA: Diagnosis not present

## 2021-10-12 DIAGNOSIS — R279 Unspecified lack of coordination: Secondary | ICD-10-CM | POA: Diagnosis not present

## 2021-10-12 DIAGNOSIS — Z9181 History of falling: Secondary | ICD-10-CM | POA: Diagnosis not present

## 2021-10-12 DIAGNOSIS — I69354 Hemiplegia and hemiparesis following cerebral infarction affecting left non-dominant side: Secondary | ICD-10-CM | POA: Diagnosis not present

## 2021-10-12 DIAGNOSIS — M6281 Muscle weakness (generalized): Secondary | ICD-10-CM | POA: Diagnosis not present

## 2021-10-13 DIAGNOSIS — I69354 Hemiplegia and hemiparesis following cerebral infarction affecting left non-dominant side: Secondary | ICD-10-CM | POA: Diagnosis not present

## 2021-10-13 DIAGNOSIS — Z9181 History of falling: Secondary | ICD-10-CM | POA: Diagnosis not present

## 2021-10-13 DIAGNOSIS — R279 Unspecified lack of coordination: Secondary | ICD-10-CM | POA: Diagnosis not present

## 2021-10-13 DIAGNOSIS — M6281 Muscle weakness (generalized): Secondary | ICD-10-CM | POA: Diagnosis not present

## 2021-10-16 DIAGNOSIS — I69354 Hemiplegia and hemiparesis following cerebral infarction affecting left non-dominant side: Secondary | ICD-10-CM | POA: Diagnosis not present

## 2021-10-16 DIAGNOSIS — Z9181 History of falling: Secondary | ICD-10-CM | POA: Diagnosis not present

## 2021-10-16 DIAGNOSIS — R279 Unspecified lack of coordination: Secondary | ICD-10-CM | POA: Diagnosis not present

## 2021-10-16 DIAGNOSIS — M6281 Muscle weakness (generalized): Secondary | ICD-10-CM | POA: Diagnosis not present

## 2021-10-17 DIAGNOSIS — I69354 Hemiplegia and hemiparesis following cerebral infarction affecting left non-dominant side: Secondary | ICD-10-CM | POA: Diagnosis not present

## 2021-10-17 DIAGNOSIS — M6281 Muscle weakness (generalized): Secondary | ICD-10-CM | POA: Diagnosis not present

## 2021-10-17 DIAGNOSIS — R279 Unspecified lack of coordination: Secondary | ICD-10-CM | POA: Diagnosis not present

## 2021-10-17 DIAGNOSIS — Z9181 History of falling: Secondary | ICD-10-CM | POA: Diagnosis not present

## 2021-10-18 DIAGNOSIS — R279 Unspecified lack of coordination: Secondary | ICD-10-CM | POA: Diagnosis not present

## 2021-10-18 DIAGNOSIS — I69354 Hemiplegia and hemiparesis following cerebral infarction affecting left non-dominant side: Secondary | ICD-10-CM | POA: Diagnosis not present

## 2021-10-18 DIAGNOSIS — Z9181 History of falling: Secondary | ICD-10-CM | POA: Diagnosis not present

## 2021-10-18 DIAGNOSIS — M6281 Muscle weakness (generalized): Secondary | ICD-10-CM | POA: Diagnosis not present

## 2021-10-20 ENCOUNTER — Non-Acute Institutional Stay (SKILLED_NURSING_FACILITY): Payer: Medicare Other | Admitting: Adult Health

## 2021-10-20 ENCOUNTER — Encounter: Payer: Self-pay | Admitting: Adult Health

## 2021-10-20 DIAGNOSIS — R279 Unspecified lack of coordination: Secondary | ICD-10-CM | POA: Diagnosis not present

## 2021-10-20 DIAGNOSIS — I700 Atherosclerosis of aorta: Secondary | ICD-10-CM

## 2021-10-20 DIAGNOSIS — I6350 Cerebral infarction due to unspecified occlusion or stenosis of unspecified cerebral artery: Secondary | ICD-10-CM | POA: Diagnosis not present

## 2021-10-20 DIAGNOSIS — Z9181 History of falling: Secondary | ICD-10-CM | POA: Diagnosis not present

## 2021-10-20 DIAGNOSIS — I421 Obstructive hypertrophic cardiomyopathy: Secondary | ICD-10-CM

## 2021-10-20 DIAGNOSIS — M6281 Muscle weakness (generalized): Secondary | ICD-10-CM | POA: Diagnosis not present

## 2021-10-20 DIAGNOSIS — I69354 Hemiplegia and hemiparesis following cerebral infarction affecting left non-dominant side: Secondary | ICD-10-CM | POA: Diagnosis not present

## 2021-10-20 NOTE — Progress Notes (Signed)
Location:  [place] Room Number: 138-P Place of Service:  SNF (31)   CODE STATUS: DNR  Allergies  Allergen Reactions   Hydrochlorothiazide     05/29/2021 K+ 2.6 Other reaction(s): Other (See Comments) 05/29/2021 K+ 2.6   Codeine Nausea Only    Chief Complaint  Patient presents with   Acute Visit    Care plan meeting    HPI:  We have come together for her care plan meeting. Family present. No BIMS mood 6/30: decreased energy; nervous; some depression. She requires extensive to dependent assist with her adl care. She is incontinent of bladder and bowel. She is nonambulatory; has had 3 falls without injury. Her weight is 134 pounds which is stable. She requires assist with her meals. Is on regular diet appetite is variable. Therapy: stand/pivot mod to min assist; bed roll min assist; supine to sit: mod assist; is able to stand for 3 minutes with min assist. She has a left wrist splint. Her BRP are easier as she is able to stand. She continues to be followed for her chronic illnesses including:   HOCM (hypertrophic obstructive cardiomyopathy) Thoracic aortic atherosclerosis  Right pontine cerebrovascular accident (CVA)  Past Medical History:  Diagnosis Date   Decreased sense of smell    and taste-negative CT scan-2011   Depression    Essential hypertension    GERD (gastroesophageal reflux disease)    Heart murmur    heard first time 4/12   History of mammogram    2010 and she does not want to repeat them anymore   HOCM (hypertrophic obstructive cardiomyopathy) ([place])    a. Dx by echo 05/2014.   Hypothyroidism    IBS (irritable bowel syndrome)    Impaired fasting glucose    Low back pain    Lumbar radiculopathy    with negative MRI (1991)   Postmenopausal    with osteopenia, bisphosphonates 1/05-1/11, improved osteopenia 4/12-repeat planned late 2015   Shingles    2015   Thyroid nodule    Vitamin D deficiency    repleted on weekly vitamin D 50000 iu     Past Surgical History:  Procedure Laterality Date   ABDOMINAL HYSTERECTOMY     BREAST BIOPSY     x2   CATARACT EXTRACTION     removal with IOL bilateral   GLAUCOMA SURGERY     laser   skin cancer removal     THYROIDECTOMY     subtotal    Social History   Socioeconomic History   Marital status: Married    Spouse name: Not on file   Number of children: Not on file   Years of education: Not on file   Highest education level: Not on file  Occupational History   Not on file  Tobacco Use   Smoking status: Never   Smokeless tobacco: Never  Vaping Use   Vaping Use: Never used  Substance and Sexual Activity   Alcohol use: No    Alcohol/week: 0.0 standard drinks   Drug use: Never   Sexual activity: Not on file  Other Topics Concern   Not on file  Social History Narrative   Not on file   Social Determinants of Health   Financial Resource Strain: Not on file  Food Insecurity: Not on file  Transportation Needs: Not on file  Physical Activity: Not on file  Stress: Not on file  Social Connections: Not on file  Intimate Partner Violence: Not on file  Family History  Problem Relation Age of Onset   Breast cancer Mother    Hypertension Father    CVA Father    Heart failure Sister    CAD Sister    Kidney disease Sister    COPD Brother    Lung cancer Brother    CAD Brother    Diabetes Brother    Stroke Father    Heart attack Neg Hx       VITAL SIGNS BP 132/68    Pulse 71    Temp 97.7 F (36.5 C)    Resp 20    Ht 5\' 2"  (1.575 m)    Wt 134 lb 6.4 oz (61 kg)    SpO2 94%    BMI 24.58 kg/m   Outpatient Encounter Medications as of 10/20/2021  Medication Sig   acetaminophen (TYLENOL) 325 MG tablet Take 2 tablets (650 mg total) by mouth every 4 (four) hours as needed for mild pain (or temp > 37.5 C (99.5 F)).   amantadine (SYMMETREL) 50 MG/5ML solution Take 50 mg by mouth 2 (two) times daily. With breakfast and lunch   Amino Acids-Protein Hydrolys (FEEDING  SUPPLEMENT, PRO-STAT SUGAR FREE 64,) LIQD Take 30 mLs by mouth 3 (three) times daily with meals. for albumin 2.8   aspirin EC 81 MG EC tablet Take 1 tablet (81 mg total) by mouth daily with breakfast. Swallow whole.   Cholecalciferol (VITAMIN D-3) 25 MCG (1000 UT) CAPS Take 1 capsule by mouth daily.   donepezil (ARICEPT) 5 MG tablet Take 1 tablet (5 mg total) by mouth at bedtime.   famotidine (PEPCID) 20 MG tablet Take 1 tablet (20 mg total) by mouth daily.   levothyroxine (SYNTHROID, LEVOTHROID) 75 MCG tablet Take 75 mcg by mouth daily before breakfast.    memantine (NAMENDA XR) 28 MG CP24 24 hr capsule Take 28 mg by mouth daily.   Menthol, Topical Analgesic, (BIOFREEZE) 4 % GEL Apply topically as needed. Left shoulder pain   NON FORMULARY Diet:  Regular, thin liquids   oxymetazoline (AFRIN) 0.05 % nasal spray Place 1 spray into both nostrils as needed. epistaxis   polyethylene glycol (MIRALAX / GLYCOLAX) 17 g packet Take 17 g by mouth daily.   pyridOXINE (VITAMIN B-6) 25 MG tablet Take 25 mg by mouth daily.   QUEtiapine (SEROQUEL) 25 MG tablet Take 0.5 tablets (12.5 mg total) by mouth daily after supper.   rosuvastatin (CRESTOR) 20 MG tablet Take 20 mg by mouth daily. [DX: Mixed hyperlipidemia]   sertraline (ZOLOFT) 100 MG tablet Take 100 mg by mouth daily. At 9 am along with other dose totaling 125 mg day   sertraline (ZOLOFT) 25 MG tablet Take 25 mg by mouth daily. At 9 am along with other dose   sodium chloride (OCEAN) 0.65 % SOLN nasal spray Place 1 spray into both nostrils 4 (four) times daily. And as needed   vitamin C (ASCORBIC ACID) 500 MG tablet Take 500 mg by mouth daily.   [DISCONTINUED] atorvastatin (LIPITOR) 40 MG tablet Take 1 tablet (40 mg total) by mouth daily.   [DISCONTINUED] memantine (NAMENDA XR) 21 MG CP24 24 hr capsule Take 21 mg by mouth daily.   No facility-administered encounter medications on file as of 10/20/2021.     SIGNIFICANT DIAGNOSTIC EXAMS  PREVIOUS    05-29-21: ct of head code stroke:  No evidence of acute intracranial abnormality. ASPECTS is 10. Mild generalized cerebral atrophy. Paranasal sinus disease at the imaged levels, as described.  05-29-21:  ct angio of head and neck:  CTA neck: 1. The common carotid and internal carotid arteries are patent within the neck without stenosis. Mild atherosclerotic plaque within both carotid systems within the neck, as described. 2. Vertebral arteries patent within the neck. Mild-to-moderate stenosis within the distal V2 segment of the non-dominant left vertebral artery. 3. Mild nonspecific fusiform dilation of the distal cervical left ICA to 7 mm. CTA head: 1. No intracranial large vessel occlusion or proximal high-grade arterial stenosis identified. 2. Intracranial atherosclerotic disease, as described. 3. 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 At least one branch vessel arises from this aneurysm. 4. 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  05-29-21: MRI of brain:  1. Acute right pontine infarct. Mild associated edema without mass effect. 2. Multiple small remote bilateral cerebellar lacunar infarcts. 3. Mild for age chronic microvascular ischemic disease and moderate atrophy.  05-29-21: 2-d echo:  1. No SAM noted mid/apical cavity obliteration in systole with some flow  acceleration peak velocity in 21m/sec range . Left ventricular ejection  fraction, by estimation, is 60 to 65%. The left ventricle has normal  function. The left ventricle has no  regional wall motion abnormalities. There is severe left ventricular  hypertrophy. Left ventricular diastolic parameters are consistent with  Grade I diastolic dysfunction (impaired relaxation).   05-30-21: EEG This study is suggestive of mild diffuse encephalopathy, nonspecific etiology. No seizures or epileptiform discharges were seen throughout the recording.   NO NEW EXAMS  LABS  REVIEWED PREVIOUS   05-29-21: wbc 21.3; hgb 15.6; hct 45.5; mcv 87.2 plt 317; glucose 137; bun 18; creat 0.90; k+ 2.6; na++ 136; ca 8.9; GFR>60; liver normal albumin 4.2; mag 1.8; tsh 2.093; vit B 12: 584 vit D 36.20 05-30-21: hgb a1c 5.7; chol 203; ldl 127; trig 196 hdl 37 06-16-21: wbc 10.7; hb 13.4; hct 40.3; mcv 90.6 plt 250; glucose 105; bun 33; creat 1.03; k+ 4.2; na++ 137; ca 9.1; GFR 53 06-26-21: wbc 7.5; hgb 12.7; hct 38.2; mcv 90.7 plt 22; glucose 112; bun 29; creat 1.07; k+ 3.9; na++ 139; ca 9,4; GFR 50  06-27-21: urine culture: 20,000 proteus mirabilis  07-12-21: wbc 14.9; hgb 14.4; hct 42.8; mcv 93.0 plt 271; glucose 128; bun 27; creat 0.95; k+ 3.2; na++ 139; ca 9.1; GFR 58; liver normal albumin 3.6 07-28-21: wbc 9.5; hgb 12.8; hct 38.4; mcv 993.7 plt 237; glucose 102; bun 25; creat 0.81; k+ 3.3; na++ 139; ca 8.2 GFR>60; liver normal albumin 3.2; tsh 2.457 vit B 12: 261; folate 9.7   NO NEW LABS.   Review of Systems  Constitutional:  Negative for malaise/fatigue.  Respiratory:  Negative for cough and shortness of breath.   Cardiovascular:  Negative for chest pain, palpitations and leg swelling.  Gastrointestinal:  Negative for abdominal pain, constipation and heartburn.  Musculoskeletal:  Negative for back pain, joint pain and myalgias.  Skin: Negative.   Neurological:  Negative for dizziness.  Psychiatric/Behavioral:  The patient is not nervous/anxious.    Physical Exam Constitutional:      General: She is not in acute distress.    Appearance: She is well-developed. She is not diaphoretic.  Neck:     Thyroid: No thyromegaly.  Cardiovascular:     Rate and Rhythm: Normal rate and regular rhythm.     Pulses: Normal pulses.     Heart sounds: Normal heart sounds.  Pulmonary:     Effort: Pulmonary effort is  normal. No respiratory distress.     Breath sounds: Normal breath sounds.  Abdominal:     General: Bowel sounds are normal. There is no distension.     Palpations: Abdomen is  soft.     Tenderness: There is no abdominal tenderness.  Musculoskeletal:     Cervical back: Neck supple.     Right lower leg: No edema.     Left lower leg: No edema.     Comments:  Left hemiplegia    Lymphadenopathy:     Cervical: No cervical adenopathy.  Skin:    General: Skin is warm and dry.  Neurological:     Mental Status: She is alert. Mental status is at baseline.  Psychiatric:        Mood and Affect: Mood normal.      ASSESSMENT/ PLAN:  TODAY  HOCM (hypertrophic obstructive cardiomyopathy) Thoracic aortic atherosclerosis Right pontine cerebrovascular accident (CVA)  Will continue current medications Will continue therapy as directed Will continue to monitor her status.   Time spent with patient: 40 minutes: therapy; medications; care [place] NP [facility]  call [phone]

## 2021-10-21 DIAGNOSIS — R279 Unspecified lack of coordination: Secondary | ICD-10-CM | POA: Diagnosis not present

## 2021-10-21 DIAGNOSIS — Z9181 History of falling: Secondary | ICD-10-CM | POA: Diagnosis not present

## 2021-10-21 DIAGNOSIS — I69354 Hemiplegia and hemiparesis following cerebral infarction affecting left non-dominant side: Secondary | ICD-10-CM | POA: Diagnosis not present

## 2021-10-21 DIAGNOSIS — M6281 Muscle weakness (generalized): Secondary | ICD-10-CM | POA: Diagnosis not present

## 2021-10-23 DIAGNOSIS — M6281 Muscle weakness (generalized): Secondary | ICD-10-CM | POA: Diagnosis not present

## 2021-10-23 DIAGNOSIS — Z9181 History of falling: Secondary | ICD-10-CM | POA: Diagnosis not present

## 2021-10-23 DIAGNOSIS — I69354 Hemiplegia and hemiparesis following cerebral infarction affecting left non-dominant side: Secondary | ICD-10-CM | POA: Diagnosis not present

## 2021-10-23 DIAGNOSIS — R279 Unspecified lack of coordination: Secondary | ICD-10-CM | POA: Diagnosis not present

## 2021-10-24 ENCOUNTER — Encounter: Payer: Self-pay | Admitting: Physical Medicine & Rehabilitation

## 2021-10-24 ENCOUNTER — Encounter: Payer: Medicare Other | Attending: Physical Medicine & Rehabilitation | Admitting: Physical Medicine & Rehabilitation

## 2021-10-24 VITALS — BP 146/76 | HR 90 | Temp 98.9°F | Ht 62.0 in

## 2021-10-24 DIAGNOSIS — M6281 Muscle weakness (generalized): Secondary | ICD-10-CM | POA: Diagnosis not present

## 2021-10-24 DIAGNOSIS — Z9181 History of falling: Secondary | ICD-10-CM | POA: Diagnosis not present

## 2021-10-24 DIAGNOSIS — I6350 Cerebral infarction due to unspecified occlusion or stenosis of unspecified cerebral artery: Secondary | ICD-10-CM | POA: Insufficient documentation

## 2021-10-24 DIAGNOSIS — R279 Unspecified lack of coordination: Secondary | ICD-10-CM | POA: Diagnosis not present

## 2021-10-24 DIAGNOSIS — I69354 Hemiplegia and hemiparesis following cerebral infarction affecting left non-dominant side: Secondary | ICD-10-CM | POA: Diagnosis not present

## 2021-10-24 NOTE — Patient Instructions (Signed)
Please call if increasing tone in left arm or leg

## 2021-10-24 NOTE — Progress Notes (Signed)
Subjective:    Patient ID: Vickie Mckee, female    DOB: Oct 16, 1931, 88 y.o.   MRN: [id number] 88 y.o. right-handed female with history of hypertension hypothyroidism chronic low back pain hypertrophic obstructive cardiomyopathy some memory loss maintained on Aricept.  Per chart review lives alone independent with assistive device.  Two-level home bed and bath main level with ramped entrance.  She has family and friends to assist her as needed.  Presented to [facility]/29/2021 with acute onset of left-sided weakness and dysarthria as well as left lateral gaze and 1 episode of vomiting.  Cranial CT scan negative for acute changes.  CT angiogram of head and neck showed no large vessel occlusion or high-grade stenosis.  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  Patient did not receive tPA.  MRI showed acute right pontine infarction.  Multiple small remote bilateral cerebellar lacunar infarcts.  Echocardiogram with ejection fraction of 60 to 65% no wall motion abnormalities.  EEG negative for seizure.  Admission chemistries alcohol negative potassium 2.6 urine drug screen negative.  Maintained on aspirin 81 mg daily and Plavix 75 mg daily for CVA prophylaxis x1 month then anticipated monotherapy.  Subcutaneous Lovenox for DVT prophylaxis.  Dysphagia #1 nectar thick liquid diet.  Therapy evaluations completed due to patient's left-sided weakness and dysarthria was admitted for a comprehensive rehab program.  Admit date: 06/01/2021 Discharge date: 06/30/2021 HPI Pt resides at [facility]  Patient brought to clinic by her daughter today first time using private vehicle. She has had a fall or at least a near fall at the [facility] and has resumed physical therapy. She had no injury with the fall. In terms of strength the patient and daughter have not noticed any improvements in  strength in the left upper or left lower limb. Tone has also remained about the same.  No worsening. Pain Inventory Average Pain 0 Pain Right Now 0 My pain is  no pain  In the last 24 hours, has pain interfered with the following? General activity 0 Relation with others 0 Enjoyment of life 0 What TIME of day is your pain at its worst? varies, no pain Sleep (in general) Good  Pain is worse with: no pain Pain improves with: no pain Relief from Meds:  no pain  Family History  Problem Relation Age of Onset   Breast cancer Mother    Hypertension Father    CVA Father    Heart failure Sister    CAD Sister    Kidney disease Sister    COPD Brother    Lung cancer Brother    CAD Brother    Diabetes Brother    Stroke Father    Heart attack Neg Hx    Social History   Socioeconomic History   Marital status: Married    Spouse name: Not on file   Number of children: Not on file   Years of education: Not on file   Highest education level: Not on file  Occupational History   Not on file  Tobacco Use   Smoking status: Never   Smokeless tobacco: Never  Vaping Use   Vaping Use: Never used  Substance and Sexual Activity   Alcohol use: No    Alcohol/week: 0.0 standard drinks   Drug use: Never   Sexual activity: Not on file  Other Topics Concern  Not on file  Social History Narrative   Not on file   Social Determinants of Health   Financial Resource Strain: Not on file  Food Insecurity: Not on file  Transportation Needs: Not on file  Physical Activity: Not on file  Stress: Not on file  Social Connections: Not on file   Past Surgical History:  Procedure Laterality Date   ABDOMINAL HYSTERECTOMY     BREAST BIOPSY     x2   CATARACT EXTRACTION     removal with IOL bilateral   GLAUCOMA SURGERY     laser   skin cancer removal     THYROIDECTOMY     subtotal   Past Surgical History:  Procedure Laterality Date   ABDOMINAL HYSTERECTOMY     BREAST BIOPSY     x2    CATARACT EXTRACTION     removal with IOL bilateral   GLAUCOMA SURGERY     laser   skin cancer removal     THYROIDECTOMY     subtotal   Past Medical History:  Diagnosis Date   Decreased sense of smell    and taste-negative CT scan-2011   Depression    Essential hypertension    GERD (gastroesophageal reflux disease)    Heart murmur    heard first time 4/12   History of mammogram    2010 and she does not want to repeat them anymore   HOCM (hypertrophic obstructive cardiomyopathy) ([place])    a. Dx by echo 05/2014.   Hypothyroidism    IBS (irritable bowel syndrome)    Impaired fasting glucose    Low back pain    Lumbar radiculopathy    with negative MRI (1991)   Postmenopausal    with osteopenia, bisphosphonates 1/05-1/11, improved osteopenia 4/12-repeat planned late 2015   Shingles    2015   Thyroid nodule    Vitamin D deficiency    repleted on weekly vitamin D 50000 iu   BP (!) 146/76    Pulse 90    Temp 98.9 F (37.2 C)    Ht 5\' 2"  (1.575 m)    SpO2 93%    BMI 24.58 kg/m   Opioid Risk Score:   Fall Risk Score:  `1  Depression screen PHQ 2/9  Depression screen [facility] 2/9 10/24/2021 10/20/2021 08/24/2021 08/21/2021  Decreased Interest 0 0 0 0  Down, Depressed, Hopeless 0 0 0 0  PHQ - 2 Score 0 0 0 0  Altered sleeping - - 0 -  Tired, decreased energy - - 0 -  Change in appetite - - 0 -  Feeling bad or failure about yourself  - - 0 -  Trouble concentrating - - 0 -  Moving slowly or fidgety/restless - - 0 -  Suicidal thoughts - - 0 -  PHQ-9 Score - - 0 -    Review of Systems  Constitutional: Negative.   HENT: Negative.    Eyes: Negative.   Respiratory: Negative.    Cardiovascular: Negative.   Gastrointestinal: Negative.   Endocrine: Negative.   Genitourinary: Negative.   Musculoskeletal:  Positive for gait problem.  Skin: Negative.   Allergic/Immunologic: Negative.   Hematological: Negative.   Psychiatric/Behavioral: Negative.        Objective:   Physical  Exam Vitals and nursing note reviewed.  Constitutional:      Appearance: She is normal weight.  HENT:     Head: Normocephalic and atraumatic.  Eyes:     General: No visual field deficit.  Extraocular Movements: Extraocular movements intact.     Conjunctiva/sclera: Conjunctivae normal.     Pupils: Pupils are equal, round, and reactive to light.  Skin:    General: Skin is warm and dry.  Neurological:     Mental Status: She is alert. She is disoriented.     Cranial Nerves: No dysarthria.     Sensory: No sensory deficit.     Motor: Abnormal muscle tone present. No tremor.     Gait: Gait abnormal.  Psychiatric:        Mood and Affect: Mood normal.        Behavior: Behavior normal.    Motor strength is trace at the left finger flexors otherwise 0/5 in the left deltoid bicep tricep grip 3 - at the left hip flexor knee extensor and ankle dorsiflexor Tone MAS 2 at the finger and elbow flexors on the left side MAS 0 at the hamstring as well as the ankle plantar flexors on the left side.      Assessment & Plan:   1.  Right pontine infarct with left spastic hemiparesis.  Spasticity is fairly mild and limited to left upper extremity.  At this point does not require any oral or injectable medications for spasticity.  May continue PT in the skilled nursing facility. Discussed need for continued stretching of left upper and left lower limb.  Daughter is aware. We discussed if spasticity worsens that the patient or family should call for another appointment for reevaluation.  We discussed typical timeframe of recovery post stroke.

## 2021-10-25 DIAGNOSIS — M6281 Muscle weakness (generalized): Secondary | ICD-10-CM | POA: Diagnosis not present

## 2021-10-25 DIAGNOSIS — I69354 Hemiplegia and hemiparesis following cerebral infarction affecting left non-dominant side: Secondary | ICD-10-CM | POA: Diagnosis not present

## 2021-10-25 DIAGNOSIS — R279 Unspecified lack of coordination: Secondary | ICD-10-CM | POA: Diagnosis not present

## 2021-10-25 DIAGNOSIS — Z9181 History of falling: Secondary | ICD-10-CM | POA: Diagnosis not present

## 2021-10-26 DIAGNOSIS — I69354 Hemiplegia and hemiparesis following cerebral infarction affecting left non-dominant side: Secondary | ICD-10-CM | POA: Diagnosis not present

## 2021-10-26 DIAGNOSIS — Z1159 Encounter for screening for other viral diseases: Secondary | ICD-10-CM | POA: Diagnosis not present

## 2021-11-02 ENCOUNTER — Encounter: Payer: Self-pay | Admitting: Adult Health

## 2021-11-02 ENCOUNTER — Non-Acute Institutional Stay (SKILLED_NURSING_FACILITY): Payer: Medicare Other | Admitting: Adult Health

## 2021-11-02 DIAGNOSIS — I700 Atherosclerosis of aorta: Secondary | ICD-10-CM | POA: Diagnosis not present

## 2021-11-02 DIAGNOSIS — I639 Cerebral infarction, unspecified: Secondary | ICD-10-CM | POA: Insufficient documentation

## 2021-11-02 DIAGNOSIS — K5909 Other constipation: Secondary | ICD-10-CM

## 2021-11-02 DIAGNOSIS — K219 Gastro-esophageal reflux disease without esophagitis: Secondary | ICD-10-CM

## 2021-11-02 DIAGNOSIS — Z1159 Encounter for screening for other viral diseases: Secondary | ICD-10-CM | POA: Diagnosis not present

## 2021-11-02 DIAGNOSIS — I69354 Hemiplegia and hemiparesis following cerebral infarction affecting left non-dominant side: Secondary | ICD-10-CM | POA: Diagnosis not present

## 2021-11-02 DIAGNOSIS — I6350 Cerebral infarction due to unspecified occlusion or stenosis of unspecified cerebral artery: Secondary | ICD-10-CM

## 2021-11-02 NOTE — Progress Notes (Signed)
Location:  [place] Room Number: 138 Place of Service:  SNF (31)   CODE STATUS: dnr   Allergies  Allergen Reactions   Hydrochlorothiazide     05/29/2021 K+ 2.6 Other reaction(s): Other (See Comments) 05/29/2021 K+ 2.6   Codeine Nausea Only    Chief Complaint  Patient presents with   Medical Management of Chronic Issues               GERD without esophagitis: Chronic constipation: Thoracic aortic atherosclerosis Ischemic stroke/right pontine stroke/left hemiplegia:    HPI:  She is a 88 year old long term resident of this facility being seen for the management of her chronic illnesses:  GERD without esophagitis: Chronic constipation: Thoracic aortic atherosclerosis Ischemic stroke/right pontine stroke/left hemiplegia. There are no reports of uncontrolled pain. No reports of heart burn; no abdominal pain. She does get out of bed daily.   Past Medical History:  Diagnosis Date   Decreased sense of smell    and taste-negative CT scan-2011   Depression    Essential hypertension    GERD (gastroesophageal reflux disease)    Heart murmur    heard first time 4/12   History of mammogram    2010 and she does not want to repeat them anymore   HOCM (hypertrophic obstructive cardiomyopathy) ([place])    a. Dx by echo 05/2014.   Hypothyroidism    IBS (irritable bowel syndrome)    Impaired fasting glucose    Low back pain    Lumbar radiculopathy    with negative MRI (1991)   Postmenopausal    with osteopenia, bisphosphonates 1/05-1/11, improved osteopenia 4/12-repeat planned late 2015   Shingles    2015   Thyroid nodule    Vitamin D deficiency    repleted on weekly vitamin D 50000 iu    Past Surgical History:  Procedure Laterality Date   ABDOMINAL HYSTERECTOMY     BREAST BIOPSY     x2   CATARACT EXTRACTION     removal with IOL bilateral   GLAUCOMA SURGERY     laser   skin cancer removal     THYROIDECTOMY     subtotal    Social History    Socioeconomic History   Marital status: Married    Spouse name: Not on file   Number of children: Not on file   Years of education: Not on file   Highest education level: Not on file  Occupational History   Not on file  Tobacco Use   Smoking status: Never   Smokeless tobacco: Never  Vaping Use   Vaping Use: Never used  Substance and Sexual Activity   Alcohol use: No    Alcohol/week: 0.0 standard drinks   Drug use: Never   Sexual activity: Not on file  Other Topics Concern   Not on file  Social History Narrative   Not on file   Social Determinants of Health   Financial Resource Strain: Not on file  Food Insecurity: Not on file  Transportation Needs: Not on file  Physical Activity: Not on file  Stress: Not on file  Social Connections: Not on file  Intimate Partner Violence: Not on file   Family History  Problem Relation Age of Onset   Breast cancer Mother    Hypertension Father    CVA Father    Heart failure Sister    CAD Sister    Kidney disease Sister    COPD Brother    Lung  cancer Brother    CAD Brother    Diabetes Brother    Stroke Father    Heart attack Neg Hx       VITAL SIGNS BP 122/62    Pulse 76    Temp 98 F (36.7 C)    Ht 5\' 2"  (1.575 m)    Wt 134 lb 6.4 oz (61 kg)    BMI 24.58 kg/m   Outpatient Encounter Medications as of 11/02/2021  Medication Sig   acetaminophen (TYLENOL) 325 MG tablet Take 2 tablets (650 mg total) by mouth every 4 (four) hours as needed for mild pain (or temp > 37.5 C (99.5 F)).   amantadine (SYMMETREL) 50 MG/5ML solution Take 50 mg by mouth 2 (two) times daily. With breakfast and lunch   Amino Acids-Protein Hydrolys (FEEDING SUPPLEMENT, PRO-STAT SUGAR FREE 64,) LIQD Take 30 mLs by mouth 3 (three) times daily with meals. for albumin 2.8   aspirin EC 81 MG EC tablet Take 1 tablet (81 mg total) by mouth daily with breakfast. Swallow whole.   Cholecalciferol (VITAMIN D-3) 25 MCG (1000 UT) CAPS Take 1 capsule by mouth daily.    donepezil (ARICEPT) 5 MG tablet Take 1 tablet (5 mg total) by mouth at bedtime.   famotidine (PEPCID) 20 MG tablet Take 1 tablet (20 mg total) by mouth daily.   levothyroxine (SYNTHROID, LEVOTHROID) 75 MCG tablet Take 75 mcg by mouth daily before breakfast.    memantine (NAMENDA XR) 28 MG CP24 24 hr capsule Take 28 mg by mouth daily.   Menthol, Topical Analgesic, (BIOFREEZE) 4 % GEL Apply topically as needed. Left shoulder pain   NON FORMULARY Diet:  Regular, thin liquids   oxymetazoline (AFRIN) 0.05 % nasal spray Place 1 spray into both nostrils as needed. epistaxis   polyethylene glycol (MIRALAX / GLYCOLAX) 17 g packet Take 17 g by mouth daily.   pyridOXINE (VITAMIN B-6) 25 MG tablet Take 25 mg by mouth daily.   QUEtiapine (SEROQUEL) 25 MG tablet Take 0.5 tablets (12.5 mg total) by mouth daily after supper.   rosuvastatin (CRESTOR) 20 MG tablet Take 20 mg by mouth daily. [DX: Mixed hyperlipidemia]   sertraline (ZOLOFT) 100 MG tablet Take 100 mg by mouth daily. At 9 am along with other dose totaling 125 mg day   sertraline (ZOLOFT) 25 MG tablet Take 25 mg by mouth daily. At 9 am along with other dose   sodium chloride (OCEAN) 0.65 % SOLN nasal spray Place 1 spray into both nostrils 4 (four) times daily. And as needed   vitamin C (ASCORBIC ACID) 500 MG tablet Take 500 mg by mouth daily.   No facility-administered encounter medications on file as of 11/02/2021.     SIGNIFICANT DIAGNOSTIC EXAMS  PREVIOUS   05-29-21: ct of head code stroke:  No evidence of acute intracranial abnormality. ASPECTS is 10. Mild generalized cerebral atrophy. Paranasal sinus disease at the imaged levels, as described.  05-29-21: ct angio of head and neck:  CTA neck: 1. The common carotid and internal carotid arteries are patent within the neck without stenosis. Mild atherosclerotic plaque within both carotid systems within the neck, as described. 2. Vertebral arteries patent within the neck. Mild-to-moderate  stenosis within the distal V2 segment of the non-dominant left vertebral artery. 3. Mild nonspecific fusiform dilation of the distal cervical left ICA to 7 mm. CTA head: 1. No intracranial large vessel occlusion or proximal high-grade arterial stenosis identified. 2. Intracranial atherosclerotic disease, as described. 3. 2 mm aneurysm  arising from the distal M1 segment of the right middle cerebral artery. At least one branch vessel arises from this aneurysm. 4. 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  05-29-21: MRI of brain:  1. Acute right pontine infarct. Mild associated edema without mass effect. 2. Multiple small remote bilateral cerebellar lacunar infarcts. 3. Mild for age chronic microvascular ischemic disease and moderate atrophy.  05-29-21: 2-d echo:  1. No SAM noted mid/apical cavity obliteration in systole with some flow  acceleration peak velocity in 50m/sec range . Left ventricular ejection  fraction, by estimation, is 60 to 65%. The left ventricle has normal  function. The left ventricle has no  regional wall motion abnormalities. There is severe left ventricular  hypertrophy. Left ventricular diastolic parameters are consistent with  Grade I diastolic dysfunction (impaired relaxation).   05-30-21: EEG This study is suggestive of mild diffuse encephalopathy, nonspecific etiology. No seizures or epileptiform discharges were seen throughout the recording.   NO NEW EXAMS  LABS REVIEWED PREVIOUS   05-29-21: wbc 21.3; hgb 15.6; hct 45.5; mcv 87.2 plt 317; glucose 137; bun 18; creat 0.90; k+ 2.6; na++ 136; ca 8.9; GFR>60; liver normal albumin 4.2; mag 1.8; tsh 2.093; vit B 12: 584 vit D 36.20 05-30-21: hgb a1c 5.7; chol 203; ldl 127; trig 196 hdl 37 06-16-21: wbc 10.7; hb 13.4; hct 40.3; mcv 90.6 plt 250; glucose 105; bun 33; creat 1.03; k+ 4.2; na++ 137; ca 9.1; GFR 53 06-26-21: wbc 7.5; hgb 12.7; hct 38.2; mcv 90.7 plt 22; glucose 112; bun 29; creat 1.07; k+ 3.9; na++  139; ca 9,4; GFR 50  06-27-21: urine culture: 20,000 proteus mirabilis  07-12-21: wbc 14.9; hgb 14.4; hct 42.8; mcv 93.0 plt 271; glucose 128; bun 27; creat 0.95; k+ 3.2; na++ 139; ca 9.1; GFR 58; liver normal albumin 3.6 07-28-21: wbc 9.5; hgb 12.8; hct 38.4; mcv 993.7 plt 237; glucose 102; bun 25; creat 0.81; k+ 3.3; na++ 139; ca 8.2 GFR>60; liver normal albumin 3.2; tsh 2.457 vit B 12: 261; folate 9. 09-07-21 k+ 3.5  NO NEW LABS.    Review of Systems  Constitutional:  Negative for malaise/fatigue.  Respiratory:  Negative for cough and shortness of breath.   Cardiovascular:  Negative for chest pain, palpitations and leg swelling.  Gastrointestinal:  Negative for abdominal pain, constipation and heartburn.  Musculoskeletal:  Negative for back pain, joint pain and myalgias.  Skin: Negative.   Neurological:  Negative for dizziness.  Psychiatric/Behavioral:  The patient is not nervous/anxious.     Physical Exam Constitutional:      General: She is not in acute distress.    Appearance: She is well-developed. She is not diaphoretic.  Neck:     Thyroid: No thyromegaly.  Cardiovascular:     Rate and Rhythm: Normal rate and regular rhythm.     Pulses: Normal pulses.     Heart sounds: Normal heart sounds.  Pulmonary:     Effort: Pulmonary effort is normal. No respiratory distress.     Breath sounds: Normal breath sounds.  Abdominal:     General: Bowel sounds are normal. There is no distension.     Palpations: Abdomen is soft.     Tenderness: There is no abdominal tenderness.  Musculoskeletal:     Cervical back: Neck supple.     Right lower leg: No edema.     Left lower leg: No edema.     Comments: Left hemiplegia   Lymphadenopathy:     Cervical: No  cervical adenopathy.  Skin:    General: Skin is warm and dry.  Neurological:     Mental Status: She is alert. Mental status is at baseline.  Psychiatric:        Mood and Affect: Mood normal.    ASSESSMENT/ PLAN:  TODAY  GERD  without esophagitis: is stable will continue pepcid 20 mg daily   2. Chronic constipation: is stable will continue miralax daily   3. Thoracic aortic atherosclerosis (cxe 12-15-14) is stable  4. Ischemic stroke/right pontine stroke/left hemiplegia: is stable will continue asa 81 mg daily   PREVIOUS   5. Somnolence: post stroke: is stable will continue amantadine 50 mg twice daily will monitor   6.  HOCM (hypertrophic obstructive cardiomyopathy) is without change in status; will monitor her status.   7. Dysphagia post stroke: no signs of aspiration present is on thin liquids  8. Stage 3a chronic kidney disease: is stable bun 25; creat 0.81; GFR >60  9. Vascular dementia without behavioral disturbance: weight is 134 pounds; will continue aricept 5 mg daily and namenda xr 21 mg daily   10. Hypoalbuminemia due to protein calorie malnutrition: albumin 3.2 will continue supplements as indicated.   11. Vitamin B 12 deficiency: is without change level is 261 will continue vitamin B 12 2,000 mcg daily   12. Postoperative hypothyroidism: is stable tsh 2.457 will continue synthroid 75 mcg daily   13. Mixed hyperlipidemia: is stable LDL 127; trig 196 will continue lipitor 40 mg daily   14. Psychosis in elderly without behavioral disturbance: is stable will continue seroquel 12.5 mg daily   15. Depression recurrent is stable will continue zoloft 125 mg daily      Ok Edwards NP [facility]   call [phone]

## 2021-11-07 ENCOUNTER — Encounter: Payer: Self-pay | Admitting: Adult Health

## 2021-11-07 NOTE — Progress Notes (Signed)
Location:  [place] Room Number: 138-P Place of Service:  SNF (31)   CODE STATUS: DNR  Allergies  Allergen Reactions   Hydrochlorothiazide     05/29/2021 K+ 2.6 Other reaction(s): Other (See Comments) 05/29/2021 K+ 2.6   Codeine Nausea Only    Chief Complaint  Patient presents with   Medical Management of Chronic Issues                  HPI:    Past Medical History:  Diagnosis Date   Decreased sense of smell    and taste-negative CT scan-2011   Depression    Essential hypertension    GERD (gastroesophageal reflux disease)    Heart murmur    heard first time 4/12   History of mammogram    2010 and she does not want to repeat them anymore   HOCM (hypertrophic obstructive cardiomyopathy) ([place])    a. Dx by echo 05/2014.   Hypothyroidism    IBS (irritable bowel syndrome)    Impaired fasting glucose    Low back pain    Lumbar radiculopathy    with negative MRI (1991)   Postmenopausal    with osteopenia, bisphosphonates 1/05-1/11, improved osteopenia 4/12-repeat planned late 2015   Shingles    2015   Thyroid nodule    Vitamin D deficiency    repleted on weekly vitamin D 50000 iu    Past Surgical History:  Procedure Laterality Date   ABDOMINAL HYSTERECTOMY     BREAST BIOPSY     x2   CATARACT EXTRACTION     removal with IOL bilateral   GLAUCOMA SURGERY     laser   skin cancer removal     THYROIDECTOMY     subtotal    Social History   Socioeconomic History   Marital status: Married    Spouse name: Not on file   Number of children: Not on file   Years of education: Not on file   Highest education level: Not on file  Occupational History   Not on file  Tobacco Use   Smoking status: Never   Smokeless tobacco: Never  Vaping Use   Vaping Use: Never used  Substance and Sexual Activity   Alcohol use: No    Alcohol/week: 0.0 standard drinks   Drug use: Never   Sexual activity: Not on file  Other Topics Concern   Not on file   Social History Narrative   Not on file   Social Determinants of Health   Financial Resource Strain: Not on file  Food Insecurity: Not on file  Transportation Needs: Not on file  Physical Activity: Not on file  Stress: Not on file  Social Connections: Not on file  Intimate Partner Violence: Not on file   Family History  Problem Relation Age of Onset   Breast cancer Mother    Hypertension Father    CVA Father    Heart failure Sister    CAD Sister    Kidney disease Sister    COPD Brother    Lung cancer Brother    CAD Brother    Diabetes Brother    Stroke Father    Heart attack Neg Hx       VITAL SIGNS BP 122/68    Pulse 78    Temp (!) 97.4 F (36.3 C)    Ht 5\' 2"  (1.575 m)    Wt 134 lb 6.4 oz (61 kg)    BMI 24.58 kg/m  Outpatient Encounter Medications as of 11/07/2021  Medication Sig   acetaminophen (TYLENOL) 325 MG tablet Take 2 tablets (650 mg total) by mouth every 4 (four) hours as needed for mild pain (or temp > 37.5 C (99.5 F)).   amantadine (SYMMETREL) 50 MG/5ML solution Take 50 mg by mouth 2 (two) times daily. With breakfast and lunch   Amino Acids-Protein Hydrolys (FEEDING SUPPLEMENT, PRO-STAT SUGAR FREE 64,) LIQD Take 30 mLs by mouth 3 (three) times daily with meals. for albumin 2.8   aspirin EC 81 MG EC tablet Take 1 tablet (81 mg total) by mouth daily with breakfast. Swallow whole.   Cholecalciferol (VITAMIN D-3) 25 MCG (1000 UT) CAPS Take 1 capsule by mouth daily.   donepezil (ARICEPT) 5 MG tablet Take 1 tablet (5 mg total) by mouth at bedtime.   famotidine (PEPCID) 20 MG tablet Take 1 tablet (20 mg total) by mouth daily.   levothyroxine (SYNTHROID, LEVOTHROID) 75 MCG tablet Take 75 mcg by mouth daily before breakfast.    memantine (NAMENDA XR) 28 MG CP24 24 hr capsule Take 28 mg by mouth daily.   Menthol, Topical Analgesic, (BIOFREEZE) 4 % GEL Apply topically as needed. Left shoulder pain   NON FORMULARY Diet:  Regular, thin liquids   oxymetazoline (AFRIN)  0.05 % nasal spray Place 1 spray into both nostrils as needed. epistaxis   polyethylene glycol (MIRALAX / GLYCOLAX) 17 g packet Take 17 g by mouth daily.   pyridOXINE (VITAMIN B-6) 25 MG tablet Take 25 mg by mouth daily.   QUEtiapine (SEROQUEL) 25 MG tablet Take 0.5 tablets (12.5 mg total) by mouth daily after supper.   rosuvastatin (CRESTOR) 20 MG tablet Take 20 mg by mouth daily. [DX: Mixed hyperlipidemia]   sertraline (ZOLOFT) 100 MG tablet Take 100 mg by mouth daily. At 9 am along with other dose totaling 125 mg day   sertraline (ZOLOFT) 25 MG tablet Take 25 mg by mouth daily. At 9 am along with other dose   sodium chloride (OCEAN) 0.65 % SOLN nasal spray Place 1 spray into both nostrils 4 (four) times daily. And as needed   vitamin B-12 (CYANOCOBALAMIN) 1000 MCG tablet Take 1,000 mcg by mouth daily.   vitamin C (ASCORBIC ACID) 500 MG tablet Take 500 mg by mouth daily.   No facility-administered encounter medications on file as of 11/07/2021.     SIGNIFICANT DIAGNOSTIC EXAMS       ASSESSMENT/ PLAN:     Ok Edwards NP [facility]  Contact [phone] Monday through Friday 8am- 5pm  After hours call [phone]   This encounter was created in error - please disregard.

## 2021-11-08 ENCOUNTER — Encounter: Payer: Self-pay | Admitting: Adult Health

## 2021-11-09 DIAGNOSIS — Z1159 Encounter for screening for other viral diseases: Secondary | ICD-10-CM | POA: Diagnosis not present

## 2021-11-09 DIAGNOSIS — I69354 Hemiplegia and hemiparesis following cerebral infarction affecting left non-dominant side: Secondary | ICD-10-CM | POA: Diagnosis not present

## 2021-11-16 DIAGNOSIS — Z1159 Encounter for screening for other viral diseases: Secondary | ICD-10-CM | POA: Diagnosis not present

## 2021-11-16 DIAGNOSIS — I69354 Hemiplegia and hemiparesis following cerebral infarction affecting left non-dominant side: Secondary | ICD-10-CM | POA: Diagnosis not present

## 2021-11-23 DIAGNOSIS — I69354 Hemiplegia and hemiparesis following cerebral infarction affecting left non-dominant side: Secondary | ICD-10-CM | POA: Diagnosis not present

## 2021-11-23 DIAGNOSIS — Z1159 Encounter for screening for other viral diseases: Secondary | ICD-10-CM | POA: Diagnosis not present

## 2021-11-30 DIAGNOSIS — I69354 Hemiplegia and hemiparesis following cerebral infarction affecting left non-dominant side: Secondary | ICD-10-CM | POA: Diagnosis not present

## 2021-11-30 DIAGNOSIS — Z1159 Encounter for screening for other viral diseases: Secondary | ICD-10-CM | POA: Diagnosis not present

## 2021-12-08 ENCOUNTER — Non-Acute Institutional Stay (SKILLED_NURSING_FACILITY): Payer: Medicare Other | Admitting: Adult Health

## 2021-12-08 ENCOUNTER — Encounter: Payer: Self-pay | Admitting: Adult Health

## 2021-12-08 DIAGNOSIS — N1831 Chronic kidney disease, stage 3a: Secondary | ICD-10-CM

## 2021-12-08 DIAGNOSIS — I421 Obstructive hypertrophic cardiomyopathy: Secondary | ICD-10-CM | POA: Diagnosis not present

## 2021-12-08 DIAGNOSIS — I69391 Dysphagia following cerebral infarction: Secondary | ICD-10-CM | POA: Diagnosis not present

## 2021-12-08 DIAGNOSIS — R400 Somnolence: Secondary | ICD-10-CM | POA: Diagnosis not present

## 2021-12-08 NOTE — Progress Notes (Signed)
Location:  [facility] Room Number: 138-P Place of Service:  SNF (31)   CODE STATUS: DNR  Allergies  Allergen Reactions   Hydrochlorothiazide     05/29/2021 K+ 2.6 Other reaction(s): Other (See Comments) 05/29/2021 K+ 2.6   Codeine Nausea Only    Chief Complaint  Patient presents with   Medical Management of Chronic Issues          Somnolence post stroke:  HOCM (hypertrophic obstructive cardiomyopathy) . Dysphagia:   Stage 3a chronic kidney disease    HPI:  She is a 88 year old long term resident of this facility being seen for the management of her chronic illnesses: Somnolence post stroke:  HOCM (hypertrophic obstructive cardiomyopathy) . Dysphagia:   Stage 3a chronic kidney disease. There are no reports of uncontrolled pain; no changes in appetite; no reports of anxiety or depressive thoughts.   Past Medical History:  Diagnosis Date   Decreased sense of smell    and taste-negative CT scan-2011   Depression    Essential hypertension    GERD (gastroesophageal reflux disease)    Heart murmur    heard first time 4/12   History of mammogram    2010 and she does not want to repeat them anymore   HOCM (hypertrophic obstructive cardiomyopathy) ([place])    a. Dx by echo 05/2014.   Hypothyroidism    IBS (irritable bowel syndrome)    Impaired fasting glucose    Low back pain    Lumbar radiculopathy    with negative MRI (1991)   Postmenopausal    with osteopenia, bisphosphonates 1/05-1/11, improved osteopenia 4/12-repeat planned late 2015   Shingles    2015   Thyroid nodule    Vitamin D deficiency    repleted on weekly vitamin D 50000 iu    Past Surgical History:  Procedure Laterality Date   ABDOMINAL HYSTERECTOMY     BREAST BIOPSY     x2   CATARACT EXTRACTION     removal with IOL bilateral   GLAUCOMA SURGERY     laser   skin cancer removal     THYROIDECTOMY     subtotal    Social History   Socioeconomic History   Marital status: Married     Spouse name: Not on file   Number of children: Not on file   Years of education: Not on file   Highest education level: Not on file  Occupational History   Not on file  Tobacco Use   Smoking status: Never   Smokeless tobacco: Never  Vaping Use   Vaping Use: Never used  Substance and Sexual Activity   Alcohol use: No    Alcohol/week: 0.0 standard drinks   Drug use: Never   Sexual activity: Not on file  Other Topics Concern   Not on file  Social History Narrative   Not on file   Social Determinants of Health   Financial Resource Strain: Not on file  Food Insecurity: Not on file  Transportation Needs: Not on file  Physical Activity: Not on file  Stress: Not on file  Social Connections: Not on file  Intimate Partner Violence: Not on file   Family History  Problem Relation Age of Onset   Breast cancer Mother    Hypertension Father    CVA Father    Heart failure Sister    CAD Sister    Kidney disease Sister    COPD Brother    Lung cancer Brother  CAD Brother    Diabetes Brother    Stroke Father    Heart attack Neg Hx       VITAL SIGNS BP 138/78    Pulse 79    Temp 98 F (36.7 C)    Resp 20    Ht 5\' 2"  (1.575 m)    Wt 138 lb (62.6 kg)    SpO2 94%    BMI 25.24 kg/m   Outpatient Encounter Medications as of 12/08/2021  Medication Sig   acetaminophen (TYLENOL) 325 MG tablet Take 2 tablets (650 mg total) by mouth every 4 (four) hours as needed for mild pain (or temp > 37.5 C (99.5 F)).   amantadine (SYMMETREL) 50 MG/5ML solution Take 50 mg by mouth 2 (two) times daily. With breakfast and lunch   Amino Acids-Protein Hydrolys (FEEDING SUPPLEMENT, PRO-STAT SUGAR FREE 64,) LIQD Take 30 mLs by mouth 3 (three) times daily with meals. for albumin 2.8  15-100 gram-kcal/30 mL; amt: 60 cc; oral Special Instructions: for albumin of 2.8 with meal Once A Day   aspirin EC 81 MG EC tablet Take 1 tablet (81 mg total) by mouth daily with breakfast. Swallow whole.    Cholecalciferol (VITAMIN D-3) 25 MCG (1000 UT) CAPS Take 1 capsule by mouth daily.   donepezil (ARICEPT) 5 MG tablet Take 1 tablet (5 mg total) by mouth at bedtime.   famotidine (PEPCID) 20 MG tablet Take 1 tablet (20 mg total) by mouth daily.   levothyroxine (SYNTHROID, LEVOTHROID) 75 MCG tablet Take 75 mcg by mouth daily before breakfast.    memantine (NAMENDA XR) 28 MG CP24 24 hr capsule Take 28 mg by mouth daily.   Menthol, Topical Analgesic, (BIOFREEZE) 4 % GEL Apply topically as needed. Left shoulder pain   NON FORMULARY Diet:  Regular, thin liquids   oxymetazoline (AFRIN) 0.05 % nasal spray Place 1 spray into both nostrils as needed. epistaxis   polyethylene glycol (MIRALAX / GLYCOLAX) 17 g packet Take 17 g by mouth daily.   pyridOXINE (VITAMIN B-6) 25 MG tablet Take 25 mg by mouth daily.   QUEtiapine (SEROQUEL) 25 MG tablet Take 0.5 tablets (12.5 mg total) by mouth daily after supper.   rosuvastatin (CRESTOR) 20 MG tablet Take 20 mg by mouth daily. [DX: Mixed hyperlipidemia]   sertraline (ZOLOFT) 100 MG tablet Take 100 mg by mouth daily. At 9 am along with other dose totaling 125 mg day   sodium chloride (OCEAN) 0.65 % SOLN nasal spray Place 1 spray into both nostrils 4 (four) times daily. And as needed   vitamin B-12 (CYANOCOBALAMIN) 1000 MCG tablet Take 1,000 mcg by mouth daily.   vitamin C (ASCORBIC ACID) 500 MG tablet Take 500 mg by mouth daily.   [DISCONTINUED] sertraline (ZOLOFT) 25 MG tablet Take 25 mg by mouth daily. At 9 am along with other dose   No facility-administered encounter medications on file as of 12/08/2021.     SIGNIFICANT DIAGNOSTIC EXAMS   PREVIOUS   05-29-21: ct of head code stroke:  No evidence of acute intracranial abnormality. ASPECTS is 10. Mild generalized cerebral atrophy. Paranasal sinus disease at the imaged levels, as described.  05-29-21: ct angio of head and neck:  CTA neck: 1. The common carotid and internal carotid arteries are patent within  the neck without stenosis. Mild atherosclerotic plaque within both carotid systems within the neck, as described. 2. Vertebral arteries patent within the neck. Mild-to-moderate stenosis within the distal V2 segment of the non-dominant left vertebral artery.  3. Mild nonspecific fusiform dilation of the distal cervical left ICA to 7 mm. CTA head: 1. No intracranial large vessel occlusion or proximal high-grade arterial stenosis identified. 2. Intracranial atherosclerotic disease, as described. 3. 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 At least one branch vessel arises from this aneurysm. 4. 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  05-29-21: MRI of brain:  1. Acute right pontine infarct. Mild associated edema without mass effect. 2. Multiple small remote bilateral cerebellar lacunar infarcts. 3. Mild for age chronic microvascular ischemic disease and moderate atrophy.  05-29-21: 2-d echo:  1. No SAM noted mid/apical cavity obliteration in systole with some flow  acceleration peak velocity in 12m/sec range . Left ventricular ejection  fraction, by estimation, is 60 to 65%. The left ventricle has normal  function. The left ventricle has no  regional wall motion abnormalities. There is severe left ventricular  hypertrophy. Left ventricular diastolic parameters are consistent with  Grade I diastolic dysfunction (impaired relaxation).   05-30-21: EEG This study is suggestive of mild diffuse encephalopathy, nonspecific etiology. No seizures or epileptiform discharges were seen throughout the recording.   NO NEW EXAMS  LABS REVIEWED PREVIOUS   05-29-21: wbc 21.3; hgb 15.6; hct 45.5; mcv 87.2 plt 317; glucose 137; bun 18; creat 0.90; k+ 2.6; na++ 136; ca 8.9; GFR>60; liver normal albumin 4.2; mag 1.8; tsh 2.093; vit B 12: 584 vit D 36.20 05-30-21: hgb a1c 5.7; chol 203; ldl 127; trig 196 hdl 37 06-16-21: wbc 10.7; hb 13.4; hct 40.3; mcv 90.6  plt 250; glucose 105; bun 33; creat 1.03; k+ 4.2; na++ 137; ca 9.1; GFR 53 06-26-21: wbc 7.5; hgb 12.7; hct 38.2; mcv 90.7 plt 22; glucose 112; bun 29; creat 1.07; k+ 3.9; na++ 139; ca 9,4; GFR 50  06-27-21: urine culture: 20,000 proteus mirabilis  07-12-21: wbc 14.9; hgb 14.4; hct 42.8; mcv 93.0 plt 271; glucose 128; bun 27; creat 0.95; k+ 3.2; na++ 139; ca 9.1; GFR 58; liver normal albumin 3.6 07-28-21: wbc 9.5; hgb 12.8; hct 38.4; mcv 993.7 plt 237; glucose 102; bun 25; creat 0.81; k+ 3.3; na++ 139; ca 8.2 GFR>60; liver normal albumin 3.2; tsh 2.457 vit B 12: 261; folate 9. 09-07-21 k+ 3.5  NO NEW LABS.    Review of Systems  Constitutional:  Negative for malaise/fatigue.  Respiratory:  Negative for cough and shortness of breath.   Cardiovascular:  Negative for chest pain, palpitations and leg swelling.  Gastrointestinal:  Negative for abdominal pain, constipation and heartburn.  Musculoskeletal:  Negative for back pain, joint pain and myalgias.  Skin: Negative.   Neurological:  Negative for dizziness.  Psychiatric/Behavioral:  The patient is not nervous/anxious.      Physical Exam Constitutional:      General: She is not in acute distress.    Appearance: She is well-developed. She is not diaphoretic.  Neck:     Thyroid: No thyromegaly.  Cardiovascular:     Rate and Rhythm: Normal rate and regular rhythm.     Heart sounds: Normal heart sounds.  Pulmonary:     Effort: Pulmonary effort is normal. No respiratory distress.     Breath sounds: Normal breath sounds.  Abdominal:     General: Bowel sounds are normal. There is no distension.     Palpations: Abdomen is soft.     Tenderness: There is no abdominal tenderness.  Musculoskeletal:     Cervical back: Neck supple.  Right lower leg: No edema.     Left lower leg: No edema.     Comments: Left hemiplegia   Lymphadenopathy:     Cervical: No cervical adenopathy.  Skin:    General: Skin is warm and dry.  Neurological:     Mental  Status: She is alert. Mental status is at baseline.  Psychiatric:        Mood and Affect: Mood normal.      ASSESSMENT/ PLAN:  TODAY  Somnolence post stroke: is stable will continue amantadine 50 mg twice daily will monitor   2. HOCM (hypertrophic obstructive cardiomyopathy) is stable will monitor   3. Dysphagia: without signs of aspiration; is on thin liquids.   4. Stage 3a chronic kidney disease: is stable bun 25; creat 0.81 GFR>60   PREVIOUS   5. Vascular dementia without behavioral disturbance: weight is 134 pounds; will continue aricept 5 mg daily and namenda xr 21 mg daily   6. Hypoalbuminemia due to protein calorie malnutrition: albumin 3.2 will continue supplements as indicated.   7. Vitamin B 12 deficiency: is without change level is 261 will continue vitamin B 12 2,000 mcg daily   8. Postoperative hypothyroidism: is stable tsh 2.457 will continue synthroid 75 mcg daily   9. Mixed hyperlipidemia: is stable LDL 127; trig 196 will continue lipitor 40 mg daily   10. Psychosis in elderly without behavioral disturbance: is stable will continue seroquel 12.5 mg daily   11. Depression recurrent is stable will continue zoloft 125 mg daily   12. GERD without esophagitis: is stable will continue pepcid 20 mg daily   13. Chronic constipation: is stable will continue miralax daily   14. Thoracic aortic atherosclerosis (cxe 12-15-14) is stable  15. Ischemic stroke/right pontine stroke/left hemiplegia: is stable will continue asa 81 mg daily        Ok Edwards NP [facility]  call [phone]

## 2022-01-10 ENCOUNTER — Non-Acute Institutional Stay (SKILLED_NURSING_FACILITY): Payer: Medicare Other | Admitting: Internal Medicine

## 2022-01-10 DIAGNOSIS — G8194 Hemiplegia, unspecified affecting left nondominant side: Secondary | ICD-10-CM | POA: Diagnosis not present

## 2022-01-10 DIAGNOSIS — J849 Interstitial pulmonary disease, unspecified: Secondary | ICD-10-CM

## 2022-01-10 DIAGNOSIS — F339 Major depressive disorder, recurrent, unspecified: Secondary | ICD-10-CM

## 2022-01-10 DIAGNOSIS — F0150 Vascular dementia without behavioral disturbance: Secondary | ICD-10-CM

## 2022-01-10 DIAGNOSIS — E46 Unspecified protein-calorie malnutrition: Secondary | ICD-10-CM | POA: Diagnosis not present

## 2022-01-10 DIAGNOSIS — N1831 Chronic kidney disease, stage 3a: Secondary | ICD-10-CM | POA: Diagnosis not present

## 2022-01-10 DIAGNOSIS — E8809 Other disorders of plasma-protein metabolism, not elsewhere classified: Secondary | ICD-10-CM

## 2022-01-10 DIAGNOSIS — E890 Postprocedural hypothyroidism: Secondary | ICD-10-CM | POA: Diagnosis not present

## 2022-01-10 NOTE — Progress Notes (Signed)
? ?NURSING HOME LOCATION: [facility] ?ROOM NUMBER:  138 P ? ?CODE STATUS:  DNR ? ?PCP: Ok Edwards NP,PSC ? ?This is a nursing facility follow up visit of chronic medical diagnoses & to document compliance with Regulation 483.30 (c) in [employer] which mandates caregiver visit ( visits can alternate among physician, PA or NP as per statutes) within 10 days of 30 days / 60 days/ 90 days post admission to SNF date   ? ?Interim medical record and care since last SNF visit was updated with review of diagnostic studies and change in clinical status since last visit were documented. ? ?HPI: She is a permanent resident of this facility with medical diagnoses of essential hypertension, GERD, history of hypertrophic obstructive cardiomyopathy, hypothyroidism, IBS, and history of CVA.  CVA is complicated by left hemiparesis as well as dementia. ?Significant surgeries include subtotal thyroidectomy for thyroid nodule. ? ?Extensive labs were completed in October 2022.  At that time she had mild hypokalemia which was subsequently corrected as of 11/17.  Mild hypocalcemia is present with a value of 8.2.  CKD stage II is present with a creatinine of 0.81 and GFR greater than 60.  Borderline protein/caloric malnutrition is suggested by albumin of 3.2 and total protein of 6.2.  CBC and differential was normal.  TSH was therapeutic at 2.457.  In August 2022 vitamin D level was found to be low normal at 36.2. ? ?Review of systems: Dementia invalidated responses.  She could not provide the date, even the year.  She states it is "20 something".  She could not name the [place].  She could only give me her daughter's birth day & month but not the year. ?She stated that she was "doing fine". ?Apparently the daughter feels that she had fallen 2 days ago; the patient was unaware of this.  Staff reports that she has been trying to mobilize without help despite left hemiparesis. As noted   history is invalid but she does deny any neurologic or cardiac prodrome phenomena. ?She does state that sometimes "heart hurts" when her daughter tries to help her exercise in the room.  Legs are described as "trembly". ? ?Physical exam:  ?Pertinent or positive findings: She is a very sweet lady and interacts without frustration about her memory deficits.  The left nasolabial fold is decreased.  Heart rhythm is slightly irregular.  There is an intermittent grade 1/2 systolic murmur.  First heart sounds is increased.  She has rales at the bases bilaterally.  Right pedal pulses are stronger than the left.  She has trace-1/2+ edema at the sock line.  The right lower extremity is stronger to opposition than the left upper extremity.  The left upper extremity is flaccid and is in a sling.  She has interosseous wasting of the hands. ? ?General appearance: Adequately nourished; no acute distress, increased work of breathing is present.   ?Lymphatic: No lymphadenopathy about the head, neck, axilla. ?Eyes: No conjunctival inflammation or lid edema is present. There is no scleral icterus. ?Ears:  External ear exam shows no significant lesions or deformities.   ?Nose:  External nasal examination shows no deformity or inflammation. Nasal mucosa are pink and moist without lesions, exudates ?Oral exam:  Lips and gums are healthy appearing. There is no oropharyngeal erythema or exudate. ?Neck:  No thyromegaly, masses, tenderness noted.    ?Heart:  No gallop, click, rub .  ?Lungs: without wheezes, rhonchi, rubs. ?Abdomen: Bowel  sounds are normal. Abdomen is soft and nontender with no organomegaly, hernias, masses. ?GU: Deferred  ?Extremities:  No cyanosis, clubbing  ?Neurologic exam :Balance, Rhomberg, finger to nose testing could not be completed due to clinical state ?Skin: Warm & dry w/o tenting. ?No significant lesions or rash. ? ?See summary under each active problem in the Problem List with associated updated therapeutic  plan ? ? ?

## 2022-01-11 ENCOUNTER — Encounter: Payer: Self-pay | Admitting: Internal Medicine

## 2022-01-11 DIAGNOSIS — J849 Interstitial pulmonary disease, unspecified: Secondary | ICD-10-CM | POA: Insufficient documentation

## 2022-01-11 NOTE — Assessment & Plan Note (Signed)
Today the patient was interactive and cheerful.  Clinically depression is in remission at this time.  No change indicated in her present psychotropic medication regimen. ?

## 2022-01-11 NOTE — Assessment & Plan Note (Signed)
Discuss slow weaning and discontinuation of Aricept and Namenda with Latty NP and family. ?

## 2022-01-11 NOTE — Assessment & Plan Note (Signed)
Left upper extremity is flaccid and sling recommended by PT/OT. ?

## 2022-01-11 NOTE — Assessment & Plan Note (Signed)
Most recent albumin was low normal at 3.2 and total protein at 6.2.  Nutritionist will continue to monitor at SNF. ?

## 2022-01-11 NOTE — Assessment & Plan Note (Signed)
Current creatinine was 0.81 with a GFR greater than 60 indicating CKD stage II.  No change in medication regimen indicated in reference to renal function. ?

## 2022-01-11 NOTE — Assessment & Plan Note (Signed)
Her age and advanced comorbidities mitigate against any further evaluation or intervention unless there is significant hypoxemia. ?

## 2022-01-11 NOTE — Assessment & Plan Note (Signed)
Current TSH is therapeutic.  No change in L-thyroxine dose indicated. ?

## 2022-01-11 NOTE — Patient Instructions (Signed)
See assessment and plan under each diagnosis in the problem list and acutely for this visit 

## 2022-01-12 ENCOUNTER — Encounter: Payer: Self-pay | Admitting: Adult Health

## 2022-01-12 ENCOUNTER — Non-Acute Institutional Stay (SKILLED_NURSING_FACILITY): Payer: Medicare Other | Admitting: Adult Health

## 2022-01-12 ENCOUNTER — Encounter: Payer: Self-pay | Admitting: Internal Medicine

## 2022-01-12 DIAGNOSIS — G8194 Hemiplegia, unspecified affecting left nondominant side: Secondary | ICD-10-CM

## 2022-01-12 DIAGNOSIS — J849 Interstitial pulmonary disease, unspecified: Secondary | ICD-10-CM | POA: Diagnosis not present

## 2022-01-12 DIAGNOSIS — F0150 Vascular dementia without behavioral disturbance: Secondary | ICD-10-CM

## 2022-01-12 DIAGNOSIS — I700 Atherosclerosis of aorta: Secondary | ICD-10-CM | POA: Diagnosis not present

## 2022-01-12 NOTE — Progress Notes (Signed)
?Location:  [facility] [place] Room Number: 138-P ?Place of Service:  SNF (31) ? ? ?CODE STATUS: DNR ? ?Allergies  ?Allergen Reactions  ? Hydrochlorothiazide   ?  05/29/2021 K+ 2.6 ?Other reaction(s): Other (See Comments) ?05/29/2021 K+ 2.6  ? Codeine Nausea Only  ? ? ?Chief Complaint  ?Patient presents with  ? Acute Visit  ?  Care plan meeting  ? ? ?HPI: ? ?We have come together for her care plan meeting. BIMS 6/15 mood 5/30: decreased energy; anxious some depression. She is nonambulatory has had one fall without injury. She requires extensive assist with adls. She is incontinent of bladder and frequently incontinent of bowel. Dietary: weight is 138 pounds up 3.8 pounds in the past quarter fair appetite; regular diet feeds self. Therapy: none at this time. She continues to be followed for her chronic illnesses including:  Thoracic aortic atherosclerosis Interstitial lung disease  Left hemiplegia  Vascular dementia without behavioral disturbance.  ? ?Past Medical History:  ?Diagnosis Date  ? Decreased sense of smell   ? and taste-negative CT scan-2011  ? Depression   ? Essential hypertension   ? GERD (gastroesophageal reflux disease)   ? Heart murmur   ? heard first time 4/12  ? History of mammogram   ? 2010 and she does not want to repeat them anymore  ? HOCM (hypertrophic obstructive cardiomyopathy) ([place])   ? a. Dx by echo 05/2014.  ? Hypothyroidism   ? IBS (irritable bowel syndrome)   ? Impaired fasting glucose   ? Low back pain   ? Lumbar radiculopathy   ? with negative MRI (1991)  ? Postmenopausal   ? with osteopenia, bisphosphonates 1/05-1/11, improved osteopenia 4/12-repeat planned late 2015  ? Shingles   ? 2015  ? Thyroid nodule   ? Vitamin D deficiency   ? repleted on weekly vitamin D 50000 iu  ? ? ?Past Surgical History:  ?Procedure Laterality Date  ? ABDOMINAL HYSTERECTOMY    ? BREAST BIOPSY    ? x2  ? CATARACT EXTRACTION    ? removal with IOL bilateral  ? GLAUCOMA SURGERY    ? laser  ? skin  cancer removal    ? THYROIDECTOMY    ? subtotal  ? ? ?Social History  ? ?Socioeconomic History  ? Marital status: Married  ?  Spouse name: Not on file  ? Number of children: Not on file  ? Years of education: Not on file  ? Highest education level: Not on file  ?Occupational History  ? Not on file  ?Tobacco Use  ? Smoking status: Never  ? Smokeless tobacco: Never  ?Vaping Use  ? Vaping Use: Never used  ?Substance and Sexual Activity  ? Alcohol use: No  ?  Alcohol/week: 0.0 standard drinks  ? Drug use: Never  ? Sexual activity: Not on file  ?Other Topics Concern  ? Not on file  ?Social History Narrative  ? Not on file  ? ?Social Determinants of Health  ? ?Financial Resource Strain: Not on file  ?Food Insecurity: Not on file  ?Transportation Needs: Not on file  ?Physical Activity: Not on file  ?Stress: Not on file  ?Social Connections: Not on file  ?Intimate Partner Violence: Not on file  ? ?Family History  ?Problem Relation Age of Onset  ? Breast cancer Mother   ? Hypertension Father   ? CVA Father   ? Heart failure Sister   ? CAD Sister   ? Kidney disease Sister   ?  COPD Brother   ? Lung cancer Brother   ? CAD Brother   ? Diabetes Brother   ? Stroke Father   ? Heart attack Neg Hx   ? ? ? ? ?VITAL SIGNS ?BP (!) 145/74   Pulse 68   Temp 98.3 ?F (36.8 ?C)   Resp 20   Ht '5\' 2"'$  (1.575 m)   Wt 138 lb 3.2 oz (62.7 kg)   BMI 25.28 kg/m?  ? ?Outpatient Encounter Medications as of 01/12/2022  ?Medication Sig  ? acetaminophen (TYLENOL) 325 MG tablet Take 2 tablets (650 mg total) by mouth every 4 (four) hours as needed for mild pain (or temp > 37.5 C (99.5 F)).  ? amantadine (SYMMETREL) 50 MG/5ML solution Take 50 mg by mouth 2 (two) times daily. With breakfast and lunch  ? Amino Acids-Protein Hydrolys (FEEDING SUPPLEMENT, PRO-STAT SUGAR FREE 64,) LIQD Take 30 mLs by mouth 3 (three) times daily with meals. for albumin 2.8 ? ?15-100 gram-kcal/30 mL; amt: 60 cc; oral ?Special Instructions: for albumin of 2.8 with meal ?Once A  Day  ? aspirin EC 81 MG EC tablet Take 1 tablet (81 mg total) by mouth daily with breakfast. Swallow whole.  ? Cholecalciferol (VITAMIN D-3) 25 MCG (1000 UT) CAPS Take 1 capsule by mouth daily.  ? donepezil (ARICEPT) 5 MG tablet Take 1 tablet (5 mg total) by mouth at bedtime.  ? famotidine (PEPCID) 20 MG tablet Take 1 tablet (20 mg total) by mouth daily.  ? levothyroxine (SYNTHROID, LEVOTHROID) 75 MCG tablet Take 75 mcg by mouth daily before breakfast.   ? memantine (NAMENDA XR) 28 MG CP24 24 hr capsule Take 28 mg by mouth daily.  ? Menthol, Topical Analgesic, (BIOFREEZE) 4 % GEL Apply topically as needed. Left shoulder pain  ? NON FORMULARY Diet:  Regular, thin liquids  ? oxymetazoline (AFRIN) 0.05 % nasal spray Place 1 spray into both nostrils as needed. epistaxis  ? polyethylene glycol (MIRALAX / GLYCOLAX) 17 g packet Take 17 g by mouth daily.  ? pyridOXINE (VITAMIN B-6) 25 MG tablet Take 25 mg by mouth daily.  ? QUEtiapine (SEROQUEL) 25 MG tablet Take 0.5 tablets (12.5 mg total) by mouth daily after supper.  ? sertraline (ZOLOFT) 100 MG tablet Take 100 mg by mouth daily. At 9 am along with other dose totaling 125 mg day  ? sodium chloride (OCEAN) 0.65 % SOLN nasal spray Place 1 spray into both nostrils 4 (four) times daily. And as needed  ? vitamin B-12 (CYANOCOBALAMIN) 1000 MCG tablet Take 1,000 mcg by mouth daily.  ? vitamin C (ASCORBIC ACID) 500 MG tablet Take 500 mg by mouth daily.  ? rosuvastatin (CRESTOR) 20 MG tablet Take 20 mg by mouth daily. [DX: Mixed hyperlipidemia]  ? ?No facility-administered encounter medications on file as of 01/12/2022.  ? ? ? ?SIGNIFICANT DIAGNOSTIC EXAMS ? ?PREVIOUS  ? ?05-29-21: ct of head code stroke:  ?No evidence of acute intracranial abnormality. ASPECTS is 10. ?Mild generalized cerebral atrophy. ?Paranasal sinus disease at the imaged levels, as described. ? ?05-29-21: ct angio of head and neck:  ?CTA neck: ?1. The common carotid and internal carotid arteries are patent within  the neck without stenosis. Mild atherosclerotic plaque within both carotid systems within the neck, as described. ?2. Vertebral arteries patent within the neck. Mild-to-moderate stenosis within the distal V2 segment of the non-dominant left vertebral artery. ?3. Mild nonspecific fusiform dilation of the distal cervical left ICA to 7 mm. ?CTA head: ?1. No intracranial  large vessel occlusion or proximal high-grade arterial stenosis identified. ?2. Intracranial atherosclerotic disease, as described. ?3. 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 At least one branch vessel arises from this aneurysm. ?4. 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 ? ?05-29-21: MRI of brain:  ?1. Acute right pontine infarct. Mild associated edema without mass effect. ?2. Multiple small remote bilateral cerebellar lacunar infarcts. ?3. Mild for age chronic microvascular ischemic disease and moderate atrophy. ? ?05-29-21: 2-d echo:  ?1. No SAM noted mid/apical cavity obliteration in systole with some flow  acceleration peak velocity in 35msec range . Left ventricular ejection  fraction, by estimation, is 60 to 65%. The left ventricle has normal  ?function. The left ventricle has no  regional wall motion abnormalities. There is severe left ventricular  ?hypertrophy. Left ventricular diastolic parameters are consistent with  Grade I diastolic dysfunction (impaired relaxation).  ? ?05-30-21: EEG ?This study is suggestive of mild diffuse encephalopathy, nonspecific etiology. No seizures or epileptiform discharges were seen throughout the recording.  ? ?NO NEW EXAMS ? ?LABS REVIEWED PREVIOUS  ? ?05-29-21: wbc 21.3; hgb 15.6; hct 45.5; mcv 87.2 plt 317; glucose 137; bun 18; creat 0.90; k+ 2.6; na++ 136; ca 8.9; GFR>60; liver normal albumin 4.2; mag 1.8; tsh 2.093; vit B 12: 584 vit D 36.20 ?05-30-21: hgb a1c 5.7; chol 203; ldl 127; trig 196 hdl 37 ?06-16-21: wbc 10.7; hb 13.4; hct 40.3; mcv 90.6  plt 250; glucose 105; bun 33; creat 1.03; k+ 4.2; na++ 137; ca 9.1; GFR 53 ?06-26-21: wbc 7.5; hgb 12.7; hct 38.2; mcv 90.7 plt 22; glucose 112; bun 29; creat 1.07; k+ 3.9; na++ 139; ca 9,4; GFR 50  ?06-27-21: ur

## 2022-02-07 ENCOUNTER — Non-Acute Institutional Stay (SKILLED_NURSING_FACILITY): Payer: Medicare Other | Admitting: Adult Health

## 2022-02-07 ENCOUNTER — Encounter: Payer: Self-pay | Admitting: Adult Health

## 2022-02-07 DIAGNOSIS — F0150 Vascular dementia without behavioral disturbance: Secondary | ICD-10-CM | POA: Diagnosis not present

## 2022-02-07 DIAGNOSIS — E538 Deficiency of other specified B group vitamins: Secondary | ICD-10-CM

## 2022-02-07 DIAGNOSIS — E890 Postprocedural hypothyroidism: Secondary | ICD-10-CM | POA: Diagnosis not present

## 2022-02-07 DIAGNOSIS — E46 Unspecified protein-calorie malnutrition: Secondary | ICD-10-CM

## 2022-02-07 DIAGNOSIS — E8809 Other disorders of plasma-protein metabolism, not elsewhere classified: Secondary | ICD-10-CM

## 2022-02-07 NOTE — Progress Notes (Signed)
?Location:  [place] ?Nursing Home Room Number: 138-P ?Place of Service:  SNF (31) ? ? ?CODE STATUS: DNR ? ?Allergies  ?Allergen Reactions  ? Hydrochlorothiazide   ?  05/29/2021 K+ 2.6 ?Other reaction(s): Other (See Comments) ?05/29/2021 K+ 2.6  ? Codeine Nausea Only  ? ? ?Chief Complaint  ?Patient presents with  ? Medical Management of Chronic Issues ? ?                  Vascular dementia without behavioral disturbance:  Hypoalbuminemia due to protein calorie malnutrition  Vitamin B 12 deficiency:  Postoperative hypothyroidism:  ? ? ?HPI: ? ?She is a 88 year old long term resident of this facility being seen for the management of her chronic illnesses: Vascular dementia without behavioral disturbance:  Hypoalbuminemia due to protein calorie malnutrition  Vitamin B 12 deficiency:  Postoperative hypothyroidism. There are no reports of anxiety or depressive thoughts.  ? ?Past Medical History:  ?Diagnosis Date  ? Decreased sense of smell   ? and taste-negative CT scan-2011  ? Depression   ? Essential hypertension   ? GERD (gastroesophageal reflux disease)   ? Heart murmur   ? heard first time 4/12  ? History of mammogram   ? 2010 and she does not want to repeat them anymore  ? HOCM (hypertrophic obstructive cardiomyopathy) ([place])   ? a. Dx by echo 05/2014.  ? Hypothyroidism   ? IBS (irritable bowel syndrome)   ? Impaired fasting glucose   ? Low back pain   ? Lumbar radiculopathy   ? with negative MRI (1991)  ? Postmenopausal   ? with osteopenia, bisphosphonates 1/05-1/11, improved osteopenia 4/12-repeat planned late 2015  ? Shingles   ? 2015  ? Thyroid nodule   ? Vitamin D deficiency   ? repleted on weekly vitamin D 50000 iu  ? ? ?Past Surgical History:  ?Procedure Laterality Date  ? ABDOMINAL HYSTERECTOMY    ? BREAST BIOPSY    ? x2  ? CATARACT EXTRACTION    ? removal with IOL bilateral  ? GLAUCOMA SURGERY    ? laser  ? skin cancer removal    ? THYROIDECTOMY    ? subtotal  ? ? ?Social History  ? ?Socioeconomic  History  ? Marital status: Married  ?  Spouse name: Not on file  ? Number of children: Not on file  ? Years of education: Not on file  ? Highest education level: Not on file  ?Occupational History  ? Not on file  ?Tobacco Use  ? Smoking status: Never  ? Smokeless tobacco: Never  ?Vaping Use  ? Vaping Use: Never used  ?Substance and Sexual Activity  ? Alcohol use: No  ?  Alcohol/week: 0.0 standard drinks  ? Drug use: Never  ? Sexual activity: Not on file  ?Other Topics Concern  ? Not on file  ?Social History Narrative  ? Not on file  ? ?Social Determinants of Health  ? ?Financial Resource Strain: Not on file  ?Food Insecurity: Not on file  ?Transportation Needs: Not on file  ?Physical Activity: Not on file  ?Stress: Not on file  ?Social Connections: Not on file  ?Intimate Partner Violence: Not on file  ? ?Family History  ?Problem Relation Age of Onset  ? Breast cancer Mother   ? Hypertension Father   ? CVA Father   ? Heart failure Sister   ? CAD Sister   ? Kidney disease Sister   ? COPD Brother   ?  Lung cancer Brother   ? CAD Brother   ? Diabetes Brother   ? Stroke Father   ? Heart attack Neg Hx   ? ? ? ? ?VITAL SIGNS ?BP 133/75   Pulse 76   Temp 97.7 ?F (36.5 ?C)   Resp 20   Ht '5\' 2"'$  (1.575 m)   Wt 137 lb 9.6 oz (62.4 kg)   BMI 25.17 kg/m?  ? ?Outpatient Encounter Medications as of 02/07/2022  ?Medication Sig  ? acetaminophen (TYLENOL) 325 MG tablet Take 2 tablets (650 mg total) by mouth every 4 (four) hours as needed for mild pain (or temp > 37.5 C (99.5 F)).  ? amantadine (SYMMETREL) 50 MG/5ML solution Take 50 mg by mouth 2 (two) times daily. With breakfast and lunch  ? Amino Acids-Protein Hydrolys (FEEDING SUPPLEMENT, PRO-STAT SUGAR FREE 64,) LIQD Take 30 mLs by mouth 3 (three) times daily with meals. for albumin 2.8 ? ?15-100 gram-kcal/30 mL; amt: 60 cc; oral ?Special Instructions: for albumin of 2.8 with meal ?Once A Day  ? aspirin EC 81 MG EC tablet Take 1 tablet (81 mg total) by mouth daily with  breakfast. Swallow whole.  ? Cholecalciferol (VITAMIN D-3) 25 MCG (1000 UT) CAPS Take 1 capsule by mouth daily.  ? donepezil (ARICEPT) 5 MG tablet Take 1 tablet (5 mg total) by mouth at bedtime.  ? famotidine (PEPCID) 20 MG tablet Take 1 tablet (20 mg total) by mouth daily.  ? levothyroxine (SYNTHROID, LEVOTHROID) 75 MCG tablet Take 75 mcg by mouth daily before breakfast.   ? memantine (NAMENDA XR) 28 MG CP24 24 hr capsule Take 28 mg by mouth daily.  ? Menthol, Topical Analgesic, (BIOFREEZE) 4 % GEL Apply topically as needed. Left shoulder pain  ? NON FORMULARY Diet:  Regular, thin liquids  ? oxymetazoline (AFRIN) 0.05 % nasal spray Place 1 spray into both nostrils as needed. epistaxis  ? polyethylene glycol (MIRALAX / GLYCOLAX) 17 g packet Take 17 g by mouth daily.  ? pyridOXINE (VITAMIN B-6) 25 MG tablet Take 25 mg by mouth daily.  ? QUEtiapine (SEROQUEL) 25 MG tablet Take 0.5 tablets (12.5 mg total) by mouth daily after supper.  ? sertraline (ZOLOFT) 100 MG tablet Take 100 mg by mouth daily. At 9 am along with other dose totaling 125 mg day  ? sodium chloride (OCEAN) 0.65 % SOLN nasal spray Place 1 spray into both nostrils 4 (four) times daily. And as needed  ? vitamin B-12 (CYANOCOBALAMIN) 1000 MCG tablet Take 1,000 mcg by mouth daily.  ? vitamin C (ASCORBIC ACID) 500 MG tablet Take 500 mg by mouth daily.  ? rosuvastatin (CRESTOR) 20 MG tablet Take 20 mg by mouth daily. [DX: Mixed hyperlipidemia]  ? ?No facility-administered encounter medications on file as of 02/07/2022.  ? ? ? ?SIGNIFICANT DIAGNOSTIC EXAMS ? ?PREVIOUS  ? ?05-29-21: ct of head code stroke:  ?No evidence of acute intracranial abnormality. ASPECTS is 10. ?Mild generalized cerebral atrophy. ?Paranasal sinus disease at the imaged levels, as described. ? ?05-29-21: ct angio of head and neck:  ?CTA neck: ?1. The common carotid and internal carotid arteries are patent within the neck without stenosis. Mild atherosclerotic plaque within both carotid systems  within the neck, as described. ?2. Vertebral arteries patent within the neck. Mild-to-moderate stenosis within the distal V2 segment of the non-dominant left vertebral artery. ?3. Mild nonspecific fusiform dilation of the distal cervical left ICA to 7 mm. ?CTA head: ?1. No intracranial large vessel occlusion or proximal high-grade  arterial stenosis identified. ?2. Intracranial atherosclerotic disease, as described. ?3. 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 At least one branch vessel arises from this aneurysm. ?4. 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 ? ?05-29-21: MRI of brain:  ?1. Acute right pontine infarct. Mild associated edema without mass effect. ?2. Multiple small remote bilateral cerebellar lacunar infarcts. ?3. Mild for age chronic microvascular ischemic disease and moderate atrophy. ? ?05-29-21: 2-d echo:  ?1. No SAM noted mid/apical cavity obliteration in systole with some flow  acceleration peak velocity in 41msec range . Left ventricular ejection  fraction, by estimation, is 60 to 65%. The left ventricle has normal  ?function. The left ventricle has no  regional wall motion abnormalities. There is severe left ventricular  ?hypertrophy. Left ventricular diastolic parameters are consistent with  Grade I diastolic dysfunction (impaired relaxation).  ? ?05-30-21: EEG ?This study is suggestive of mild diffuse encephalopathy, nonspecific etiology. No seizures or epileptiform discharges were seen throughout the recording.  ? ?NO NEW EXAMS ? ?LABS REVIEWED PREVIOUS  ? ?05-29-21: wbc 21.3; hgb 15.6; hct 45.5; mcv 87.2 plt 317; glucose 137; bun 18; creat 0.90; k+ 2.6; na++ 136; ca 8.9; GFR>60; liver normal albumin 4.2; mag 1.8; tsh 2.093; vit B 12: 584 vit D 36.20 ?05-30-21: hgb a1c 5.7; chol 203; ldl 127; trig 196 hdl 37 ?06-16-21: wbc 10.7; hb 13.4; hct 40.3; mcv 90.6 plt 250; glucose 105; bun 33; creat 1.03; k+ 4.2; na++ 137; ca 9.1; GFR 53 ?06-26-21:  wbc 7.5; hgb 12.7; hct 38.2; mcv 90.7 plt 22; glucose 112; bun 29; creat 1.07; k+ 3.9; na++ 139; ca 9,4; GFR 50  ?06-27-21: urine culture: 20,000 proteus mirabilis  ?07-12-21: wbc 14.9; hgb 14.4; hct 42.8; mcv 93.0 plt

## 2022-02-08 DIAGNOSIS — E538 Deficiency of other specified B group vitamins: Secondary | ICD-10-CM | POA: Insufficient documentation

## 2022-02-12 ENCOUNTER — Other Ambulatory Visit (HOSPITAL_COMMUNITY)
Admission: RE | Admit: 2022-02-12 | Discharge: 2022-02-12 | Disposition: A | Discharge status: Home / Self Care | Payer: Medicare Other | Source: Skilled Nursing Facility | Attending: Adult Health | Admitting: Adult Health

## 2022-02-12 DIAGNOSIS — E119 Type 2 diabetes mellitus without complications: Secondary | ICD-10-CM | POA: Insufficient documentation

## 2022-02-12 DIAGNOSIS — I10 Essential (primary) hypertension: Secondary | ICD-10-CM | POA: Diagnosis not present

## 2022-02-12 DIAGNOSIS — E559 Vitamin D deficiency, unspecified: Secondary | ICD-10-CM | POA: Diagnosis not present

## 2022-02-12 LAB — COMPREHENSIVE METABOLIC PANEL
ALT: 22 U/L (ref 0–44)
AST: 21 U/L (ref 15–41)
Albumin: 3.6 g/dL (ref 3.5–5.0)
Alkaline Phosphatase: 50 U/L (ref 38–126)
Anion gap: 8 (ref 5–15)
BUN: 24 mg/dL — ABNORMAL HIGH (ref 8–23)
CO2: 28 mmol/L (ref 22–32)
Calcium: 9 mg/dL (ref 8.9–10.3)
Chloride: 105 mmol/L (ref 98–111)
Creatinine, Ser: 0.98 mg/dL (ref 0.44–1.00)
GFR, Estimated: 56 mL/min — ABNORMAL LOW (ref >60)
Glucose, Bld: 108 mg/dL — ABNORMAL HIGH (ref 70–99)
Potassium: 4 mmol/L (ref 3.5–5.1)
Sodium: 141 mmol/L (ref 135–145)
Total Bilirubin: 0.7 mg/dL (ref 0.3–1.2)
Total Protein: 6.6 g/dL (ref 6.5–8.1)

## 2022-02-12 LAB — CBC
HCT: 44.7 % (ref 36.0–46.0)
Hemoglobin: 14.8 g/dL (ref 12.0–15.0)
MCH: 29.5 pg (ref 26.0–34.0)
MCHC: 33.1 g/dL (ref 30.0–36.0)
MCV: 89.2 fL (ref 80.0–100.0)
Platelets: 221 10*3/uL (ref 150–400)
RBC: 5.01 MIL/uL (ref 3.87–5.11)
RDW: 15.2 % (ref 11.5–15.5)
WBC: 7.8 10*3/uL (ref 4.0–10.5)
nRBC: 0 % (ref 0.0–0.2)

## 2022-02-12 LAB — HEMOGLOBIN A1C
Hgb A1c MFr Bld: 5.5 % (ref 4.8–5.6)
Mean Plasma Glucose: 111.15 mg/dL

## 2022-02-12 LAB — TSH: TSH: 2.305 u[IU]/mL (ref 0.350–4.500)

## 2022-02-12 LAB — VITAMIN B12: Vitamin B-12: 2092 pg/mL — ABNORMAL HIGH (ref 180–914)

## 2022-02-12 LAB — T4, FREE: Free T4: 0.94 ng/dL (ref 0.61–1.12)

## 2022-02-14 LAB — VITAMIN D 25 HYDROXY (VIT D DEFICIENCY, FRACTURES)

## 2022-02-15 ENCOUNTER — Non-Acute Institutional Stay (SKILLED_NURSING_FACILITY): Payer: Medicare Other | Admitting: Adult Health

## 2022-02-15 ENCOUNTER — Encounter: Payer: Self-pay | Admitting: Adult Health

## 2022-02-15 DIAGNOSIS — E531 Pyridoxine deficiency: Secondary | ICD-10-CM | POA: Diagnosis not present

## 2022-02-15 DIAGNOSIS — E538 Deficiency of other specified B group vitamins: Secondary | ICD-10-CM | POA: Diagnosis not present

## 2022-02-15 DIAGNOSIS — E559 Vitamin D deficiency, unspecified: Secondary | ICD-10-CM | POA: Diagnosis not present

## 2022-02-15 NOTE — Progress Notes (Signed)
?Location:  [facility] Room Number: 138-P ?Place of Service:  SNF (31) ? ? ?CODE STATUS: DNR ? ?Allergies  ?Allergen Reactions  ? Hydrochlorothiazide   ?  05/29/2021 K+ 2.6 ?Other reaction(s): Other (See Comments) ?05/29/2021 K+ 2.6  ? Codeine Nausea Only  ? ? ?Chief Complaint  ?Patient presents with  ? Acute Visit  ?  Follow-up labs  ? ? ?HPI: ? ?She has high B12 and B6 levels. Her vitamin D level is low. There are no reports of uncontrolled pain. She continues to get out of bed daily.  ? ?Past Medical History:  ?Diagnosis Date  ? Decreased sense of smell   ? and taste-negative CT scan-2011  ? Depression   ? Essential hypertension   ? GERD (gastroesophageal reflux disease)   ? Heart murmur   ? heard first time 4/12  ? History of mammogram   ? 2010 and she does not want to repeat them anymore  ? HOCM (hypertrophic obstructive cardiomyopathy) ([place])   ? a. Dx by echo 05/2014.  ? Hypothyroidism   ? IBS (irritable bowel syndrome)   ? Impaired fasting glucose   ? Low back pain   ? Lumbar radiculopathy   ? with negative MRI (1991)  ? Postmenopausal   ? with osteopenia, bisphosphonates 1/05-1/11, improved osteopenia 4/12-repeat planned late 2015  ? Shingles   ? 2015  ? Thyroid nodule   ? Vitamin D deficiency   ? repleted on weekly vitamin D 50000 iu  ? ? ?Past Surgical History:  ?Procedure Laterality Date  ? ABDOMINAL HYSTERECTOMY    ? BREAST BIOPSY    ? x2  ? CATARACT EXTRACTION    ? removal with IOL bilateral  ? GLAUCOMA SURGERY    ? laser  ? skin cancer removal    ? THYROIDECTOMY    ? subtotal  ? ? ?Social History  ? ?Socioeconomic History  ? Marital status: Married  ?  Spouse name: Not on file  ? Number of children: Not on file  ? Years of education: Not on file  ? Highest education level: Not on file  ?Occupational History  ? Not on file  ?Tobacco Use  ? Smoking status: Never  ? Smokeless tobacco: Never  ?Vaping Use  ? Vaping Use: Never used  ?Substance and Sexual Activity  ? Alcohol use: No  ?   Alcohol/week: 0.0 standard drinks  ? Drug use: Never  ? Sexual activity: Not on file  ?Other Topics Concern  ? Not on file  ?Social History Narrative  ? Not on file  ? ?Social Determinants of Health  ? ?Financial Resource Strain: Not on file  ?Food Insecurity: Not on file  ?Transportation Needs: Not on file  ?Physical Activity: Not on file  ?Stress: Not on file  ?Social Connections: Not on file  ?Intimate Partner Violence: Not on file  ? ?Family History  ?Problem Relation Age of Onset  ? Breast cancer Mother   ? Hypertension Father   ? CVA Father   ? Heart failure Sister   ? CAD Sister   ? Kidney disease Sister   ? COPD Brother   ? Lung cancer Brother   ? CAD Brother   ? Diabetes Brother   ? Stroke Father   ? Heart attack Neg Hx   ? ? ? ? ?VITAL SIGNS ?BP (!) 162/81   Pulse 73   Temp 97.7 ?F (36.5 ?C)   Resp 20   Ht '5\' 2"'$  (1.575  m)   Wt 137 lb 9.6 oz (62.4 kg)   BMI 25.17 kg/m?  ? ?Outpatient Encounter Medications as of 02/15/2022  ?Medication Sig  ? acetaminophen (TYLENOL) 325 MG tablet Take 2 tablets (650 mg total) by mouth every 4 (four) hours as needed for mild pain (or temp > 37.5 C (99.5 F)).  ? amantadine (SYMMETREL) 50 MG/5ML solution Take 50 mg by mouth 2 (two) times daily. With breakfast and lunch  ? Amino Acids-Protein Hydrolys (FEEDING SUPPLEMENT, PRO-STAT SUGAR FREE 64,) LIQD Take 30 mLs by mouth 3 (three) times daily with meals. for albumin 2.8 ? ?15-100 gram-kcal/30 mL; amt: 60 cc; oral ?Special Instructions: for albumin of 2.8 with meal ?Once A Day  ? aspirin EC 81 MG EC tablet Take 1 tablet (81 mg total) by mouth daily with breakfast. Swallow whole.  ? Cholecalciferol (VITAMIN D-3) 25 MCG (1000 UT) CAPS Take 1 capsule by mouth daily.  ? donepezil (ARICEPT) 5 MG tablet Take 1 tablet (5 mg total) by mouth at bedtime.  ? famotidine (PEPCID) 20 MG tablet Take 1 tablet (20 mg total) by mouth daily.  ? levothyroxine (SYNTHROID, LEVOTHROID) 75 MCG tablet Take 75 mcg by mouth daily before breakfast.    ? memantine (NAMENDA XR) 28 MG CP24 24 hr capsule Take 28 mg by mouth daily.  ? Menthol, Topical Analgesic, (BIOFREEZE) 4 % GEL Apply topically as needed. Left shoulder pain  ? NON FORMULARY Diet:  Regular, thin liquids  ? oxymetazoline (AFRIN) 0.05 % nasal spray Place 1 spray into both nostrils as needed. epistaxis  ? polyethylene glycol (MIRALAX / GLYCOLAX) 17 g packet Take 17 g by mouth daily.  ? pyridOXINE (VITAMIN B-6) 25 MG tablet Take 25 mg by mouth daily.  ? sertraline (ZOLOFT) 100 MG tablet Take 100 mg by mouth daily. At 9 am along with other dose totaling 125 mg day  ? sodium chloride (OCEAN) 0.65 % SOLN nasal spray Place 1 spray into both nostrils 4 (four) times daily. And as needed  ? vitamin B-12 (CYANOCOBALAMIN) 1000 MCG tablet Take 1,000 mcg by mouth daily.  ? vitamin C (ASCORBIC ACID) 500 MG tablet Take 500 mg by mouth daily.  ? rosuvastatin (CRESTOR) 20 MG tablet Take 20 mg by mouth daily. [DX: Mixed hyperlipidemia] (Patient not taking: Reported on 02/15/2022)  ? [DISCONTINUED] QUEtiapine (SEROQUEL) 25 MG tablet Take 0.5 tablets (12.5 mg total) by mouth daily after supper.  ? ?No facility-administered encounter medications on file as of 02/15/2022.  ? ? ? ?SIGNIFICANT DIAGNOSTIC EXAMS ? ?PREVIOUS  ? ?05-29-21: ct of head code stroke:  ?No evidence of acute intracranial abnormality. ASPECTS is 10. ?Mild generalized cerebral atrophy. ?Paranasal sinus disease at the imaged levels, as described. ? ?05-29-21: ct angio of head and neck:  ?CTA neck: ?1. The common carotid and internal carotid arteries are patent within the neck without stenosis. Mild atherosclerotic plaque within both carotid systems within the neck, as described. ?2. Vertebral arteries patent within the neck. Mild-to-moderate stenosis within the distal V2 segment of the non-dominant left vertebral artery. ?3. Mild nonspecific fusiform dilation of the distal cervical left ICA to 7 mm. ?CTA head: ?1. No intracranial large vessel occlusion or  proximal high-grade arterial stenosis identified. ?2. Intracranial atherosclerotic disease, as described. ?3. 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 At least one branch vessel arises from this aneurysm. ?4. 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 ? ?05-29-21: MRI of brain:  ?  1. Acute right pontine infarct. Mild associated edema without mass effect. ?2. Multiple small remote bilateral cerebellar lacunar infarcts. ?3. Mild for age chronic microvascular ischemic disease and moderate atrophy. ? ?05-29-21: 2-d echo:  ?1. No SAM noted mid/apical cavity obliteration in systole with some flow  acceleration peak velocity in 36msec range . Left ventricular ejection  fraction, by estimation, is 60 to 65%. The left ventricle has normal  ?function. The left ventricle has no  regional wall motion abnormalities. There is severe left ventricular  ?hypertrophy. Left ventricular diastolic parameters are consistent with  Grade I diastolic dysfunction (impaired relaxation).  ? ?05-30-21: EEG ?This study is suggestive of mild diffuse encephalopathy, nonspecific etiology. No seizures or epileptiform discharges were seen throughout the recording.  ? ?NO NEW EXAMS ? ?LABS REVIEWED PREVIOUS  ? ?05-29-21: wbc 21.3; hgb 15.6; hct 45.5; mcv 87.2 plt 317; glucose 137; bun 18; creat 0.90; k+ 2.6; na++ 136; ca 8.9; GFR>60; liver normal albumin 4.2; mag 1.8; tsh 2.093; vit B 12: 584 vit D 36.20 ?05-30-21: hgb a1c 5.7; chol 203; ldl 127; trig 196 hdl 37 ?06-16-21: wbc 10.7; hb 13.4; hct 40.3; mcv 90.6 plt 250; glucose 105; bun 33; creat 1.03; k+ 4.2; na++ 137; ca 9.1; GFR 53 ?06-26-21: wbc 7.5; hgb 12.7; hct 38.2; mcv 90.7 plt 22; glucose 112; bun 29; creat 1.07; k+ 3.9; na++ 139; ca 9,4; GFR 50  ?06-27-21: urine culture: 20,000 proteus mirabilis  ?07-12-21: wbc 14.9; hgb 14.4; hct 42.8; mcv 93.0 plt 271; glucose 128; bun 27; creat 0.95; k+ 3.2; na++ 139; ca 9.1; GFR 58; liver normal albumin  3.6 ?07-28-21: wbc 9.5; hgb 12.8; hct 38.4; mcv 993.7 plt 237; glucose 102; bun 25; creat 0.81; k+ 3.3; na++ 139; ca 8.2 GFR>60; liver normal albumin 3.2; tsh 2.457 vit B 12: 261; folate 9. ?09-07-21 k+ 3.5 ? ?TODAY ?

## 2022-02-16 ENCOUNTER — Encounter: Payer: Self-pay | Admitting: Adult Health

## 2022-02-16 DIAGNOSIS — E559 Vitamin D deficiency, unspecified: Secondary | ICD-10-CM | POA: Insufficient documentation

## 2022-02-16 DIAGNOSIS — E531 Pyridoxine deficiency: Secondary | ICD-10-CM | POA: Insufficient documentation

## 2022-02-16 LAB — VITAMIN B6: Vitamin B6: 72.2 ug/L — ABNORMAL HIGH (ref 3.4–65.2)

## 2022-02-20 DIAGNOSIS — R0781 Pleurodynia: Secondary | ICD-10-CM | POA: Diagnosis not present

## 2022-02-20 DIAGNOSIS — Z9181 History of falling: Secondary | ICD-10-CM | POA: Diagnosis not present

## 2022-03-06 ENCOUNTER — Encounter: Payer: Self-pay | Admitting: Adult Health

## 2022-03-06 ENCOUNTER — Non-Acute Institutional Stay (SKILLED_NURSING_FACILITY): Payer: Medicare Other | Admitting: Adult Health

## 2022-03-06 DIAGNOSIS — F0390 Unspecified dementia without behavioral disturbance: Secondary | ICD-10-CM

## 2022-03-06 DIAGNOSIS — E782 Mixed hyperlipidemia: Secondary | ICD-10-CM

## 2022-03-06 DIAGNOSIS — F339 Major depressive disorder, recurrent, unspecified: Secondary | ICD-10-CM | POA: Diagnosis not present

## 2022-03-06 DIAGNOSIS — E559 Vitamin D deficiency, unspecified: Secondary | ICD-10-CM | POA: Diagnosis not present

## 2022-03-06 NOTE — Progress Notes (Signed)
Location:  [facility] Room Number: HA/138/P Place of Service:  SNF (31)   CODE STATUS: DNR  Allergies  Allergen Reactions   Hydrochlorothiazide     05/29/2021 K+ 2.6 Other reaction(s): Other (See Comments) 05/29/2021 K+ 2.6   Codeine Nausea Only    Chief Complaint  Patient presents with   Medical Management of Chronic Issues                            Vitamin D deficiency  Mixed hyperlipidemia:  Psychosis in elderly without behavioral disturbance Depression recurrent:    HPI:  She is a 88 year old long term resident of this facility being seen for the management of her chronic illnesses: Vitamin D deficiency  Mixed hyperlipidemia:  Psychosis in elderly without behavioral disturbance Depression recurrent. She does get out of bed daily. There are no reports of uncontrolled pain. Her weight is stable.   Past Medical History:  Diagnosis Date   Decreased sense of smell    and taste-negative CT scan-2011   Depression    Essential hypertension    GERD (gastroesophageal reflux disease)    Heart murmur    heard first time 4/12   History of mammogram    2010 and she does not want to repeat them anymore   HOCM (hypertrophic obstructive cardiomyopathy) ([place])    a. Dx by echo 05/2014.   Hypothyroidism    IBS (irritable bowel syndrome)    Impaired fasting glucose    Low back pain    Lumbar radiculopathy    with negative MRI (1991)   Postmenopausal    with osteopenia, bisphosphonates 1/05-1/11, improved osteopenia 4/12-repeat planned late 2015   Shingles    2015   Thyroid nodule    Vitamin D deficiency    repleted on weekly vitamin D 50000 iu    Past Surgical History:  Procedure Laterality Date   ABDOMINAL HYSTERECTOMY     BREAST BIOPSY     x2   CATARACT EXTRACTION     removal with IOL bilateral   GLAUCOMA SURGERY     laser   skin cancer removal     THYROIDECTOMY     subtotal    Social History   Socioeconomic History   Marital status: Married     Spouse name: Not on file   Number of children: Not on file   Years of education: Not on file   Highest education level: Not on file  Occupational History   Not on file  Tobacco Use   Smoking status: Never   Smokeless tobacco: Never  Vaping Use   Vaping Use: Never used  Substance and Sexual Activity   Alcohol use: No    Alcohol/week: 0.0 standard drinks   Drug use: Never   Sexual activity: Not on file  Other Topics Concern   Not on file  Social History Narrative   Not on file   Social Determinants of Health   Financial Resource Strain: Not on file  Food Insecurity: Not on file  Transportation Needs: Not on file  Physical Activity: Not on file  Stress: Not on file  Social Connections: Not on file  Intimate Partner Violence: Not on file   Family History  Problem Relation Age of Onset   Breast cancer Mother    Hypertension Father    CVA Father    Heart failure Sister    CAD Sister    Kidney disease  Sister    COPD Brother    Lung cancer Brother    CAD Brother    Diabetes Brother    Stroke Father    Heart attack Neg Hx       VITAL SIGNS BP 130/68   Pulse 84   Temp (!) 97.1 F (36.2 C) (Skin)   Ht '5\' 2"'$  (1.575 m)   Wt 137 lb 9.6 oz (62.4 kg)   SpO2 94%   BMI 25.17 kg/m   Outpatient Encounter Medications as of 03/06/2022  Medication Sig   acetaminophen (TYLENOL) 325 MG tablet Take 2 tablets (650 mg total) by mouth every 4 (four) hours as needed for mild pain (or temp > 37.5 C (99.5 F)).   amantadine (SYMMETREL) 50 MG/5ML solution Take 50 mg by mouth 2 (two) times daily. With breakfast and lunch   Amino Acids-Protein Hydrolys (FEEDING SUPPLEMENT, PRO-STAT SUGAR FREE 64,) LIQD Take 30 mLs by mouth 3 (three) times daily with meals. for albumin 2.8  15-100 gram-kcal/30 mL; amt: 60 cc; oral Special Instructions: for albumin of 2.8 with meal Once A Day   aspirin EC 81 MG EC tablet Take 1 tablet (81 mg total) by mouth daily with breakfast. Swallow whole.    Cholecalciferol (VITAMIN D-3) 25 MCG (1000 UT) CAPS Take 1 capsule by mouth daily.   donepezil (ARICEPT) 5 MG tablet Take 1 tablet (5 mg total) by mouth at bedtime.   famotidine (PEPCID) 20 MG tablet Take 1 tablet (20 mg total) by mouth daily.   levothyroxine (SYNTHROID, LEVOTHROID) 75 MCG tablet Take 75 mcg by mouth daily before breakfast.    memantine (NAMENDA XR) 28 MG CP24 24 hr capsule Take 28 mg by mouth daily.   Menthol, Topical Analgesic, (BIOFREEZE) 4 % GEL Apply topically as needed. Left shoulder pain   NON FORMULARY Diet:  Regular, thin liquids   oxymetazoline (AFRIN) 0.05 % nasal spray Place 1 spray into both nostrils as needed. epistaxis   polyethylene glycol (MIRALAX / GLYCOLAX) 17 g packet Take 17 g by mouth daily.   sertraline (ZOLOFT) 100 MG tablet Take 100 mg by mouth daily.   sodium chloride (OCEAN) 0.65 % SOLN nasal spray Place 1 spray into both nostrils 3 (three) times daily. And as needed   vitamin C (ASCORBIC ACID) 500 MG tablet Take 500 mg by mouth daily.   No facility-administered encounter medications on file as of 03/06/2022.     SIGNIFICANT DIAGNOSTIC EXAMS  PREVIOUS   05-29-21: ct of head code stroke:  No evidence of acute intracranial abnormality. ASPECTS is 10. Mild generalized cerebral atrophy. Paranasal sinus disease at the imaged levels, as described.  05-29-21: ct angio of head and neck:  CTA neck: 1. The common carotid and internal carotid arteries are patent within the neck without stenosis. Mild atherosclerotic plaque within both carotid systems within the neck, as described. 2. Vertebral arteries patent within the neck. Mild-to-moderate stenosis within the distal V2 segment of the non-dominant left vertebral artery. 3. Mild nonspecific fusiform dilation of the distal cervical left ICA to 7 mm. CTA head: 1. No intracranial large vessel occlusion or proximal high-grade arterial stenosis identified. 2. Intracranial atherosclerotic disease, as  described. 3. 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 At least one branch vessel arises from this aneurysm. 4. 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  05-29-21: MRI of brain:  1. Acute right pontine infarct. Mild associated edema without mass effect. 2.  Multiple small remote bilateral cerebellar lacunar infarcts. 3. Mild for age chronic microvascular ischemic disease and moderate atrophy.  05-29-21: 2-d echo:  1. No SAM noted mid/apical cavity obliteration in systole with some flow  acceleration peak velocity in 46msec range . Left ventricular ejection  fraction, by estimation, is 60 to 65%. The left ventricle has normal  function. The left ventricle has no  regional wall motion abnormalities. There is severe left ventricular  hypertrophy. Left ventricular diastolic parameters are consistent with  Grade I diastolic dysfunction (impaired relaxation).   05-30-21: EEG This study is suggestive of mild diffuse encephalopathy, nonspecific etiology. No seizures or epileptiform discharges were seen throughout the recording.   NO NEW EXAMS  LABS REVIEWED PREVIOUS   05-29-21: wbc 21.3; hgb 15.6; hct 45.5; mcv 87.2 plt 317; glucose 137; bun 18; creat 0.90; k+ 2.6; na++ 136; ca 8.9; GFR>60; liver normal albumin 4.2; mag 1.8; tsh 2.093; vit B 12: 584 vit D 36.20 05-30-21: hgb a1c 5.7; chol 203; ldl 127; trig 196 hdl 37 06-16-21: wbc 10.7; hb 13.4; hct 40.3; mcv 90.6 plt 250; glucose 105; bun 33; creat 1.03; k+ 4.2; na++ 137; ca 9.1; GFR 53 06-26-21: wbc 7.5; hgb 12.7; hct 38.2; mcv 90.7 plt 22; glucose 112; bun 29; creat 1.07; k+ 3.9; na++ 139; ca 9,4; GFR 50  06-27-21: urine culture: 20,000 proteus mirabilis  07-12-21: wbc 14.9; hgb 14.4; hct 42.8; mcv 93.0 plt 271; glucose 128; bun 27; creat 0.95; k+ 3.2; na++ 139; ca 9.1; GFR 58; liver normal albumin 3.6 07-28-21: wbc 9.5; hgb 12.8; hct 38.4; mcv 993.7 plt 237; glucose 102; bun 25; creat 0.81; k+  3.3; na++ 139; ca 8.2 GFR>60; liver normal albumin 3.2; tsh 2.457 vit B 12: 261; folate 9. 09-07-21 k+ 3.5 02-12-22: wbc 7.8; hgb 14.8; hct 44.7; mcv 89.2 plt 221; glucose 180; bun 24; creat 0.89; k+ 4.0; na++ 141; ca 9.0; GFR 56; protein 6.6; albumin 3.6; hgb a1c 5.5; tsh 2.305 free t4: 0.94; vitamin B12: 2092; vitamin B6: 72.2 vitamin D 24.4    NO NEW LABS.   Review of Systems  Constitutional:  Negative for malaise/fatigue.  Respiratory:  Negative for cough and shortness of breath.   Cardiovascular:  Negative for chest pain, palpitations and leg swelling.  Gastrointestinal:  Negative for abdominal pain, constipation and heartburn.  Musculoskeletal:  Negative for back pain, joint pain and myalgias.  Skin: Negative.   Neurological:  Negative for dizziness.  Psychiatric/Behavioral:  The patient is not nervous/anxious.     Physical Exam Constitutional:      General: She is not in acute distress.    Appearance: She is well-developed. She is not diaphoretic.  Neck:     Thyroid: No thyromegaly.  Cardiovascular:     Rate and Rhythm: Normal rate and regular rhythm.     Pulses: Normal pulses.     Heart sounds: Normal heart sounds.  Pulmonary:     Effort: Pulmonary effort is normal. No respiratory distress.     Breath sounds: Normal breath sounds.  Abdominal:     General: Bowel sounds are normal. There is no distension.     Palpations: Abdomen is soft.     Tenderness: There is no abdominal tenderness.  Musculoskeletal:     Cervical back: Neck supple.     Right lower leg: No edema.     Left lower leg: No edema.     Comments: Left hemiplegia   Lymphadenopathy:     Cervical: No cervical adenopathy.  Skin:    General: Skin is warm and dry.  Neurological:     Mental Status: She is alert. Mental status is at baseline.  Psychiatric:        Mood and Affect: Mood normal.     ASSESSMENT/ PLAN:  TODAY  Vitamin D deficiency level is 24.4 continue 1000 units daily and 50,000 units  weekly until end of June  2. Mixed hyperlipidemia: stable LDL 127; trig 196; will continue lipitor 40 mg daily   3. Psychosis in elderly without behavioral disturbance is off seroquel  4. Depression recurrent: will continue zoloft 100 mg daily    PREVIOUS   5. GERD without esophagitis: is stable will continue pepcid 20 mg daily   6. Chronic constipation: is stable will continue miralax daily   7. Thoracic aortic atherosclerosis (cxe 12-15-14) is stable  8. Ischemic stroke/right pontine stroke/left hemiplegia: is stable will continue asa 81 mg daily   9. Somnolence post stroke: is stable will continue amantadine 50 mg twice daily will monitor   10. HOCM (hypertrophic obstructive cardiomyopathy) is stable will monitor   11. Dysphagia: without signs of aspiration; is on thin liquids.   12. Stage 3a chronic kidney disease: is stable bun 25; creat 0.81 GFR>60  13. Vascular dementia without behavioral disturbance: weight is 137 pounds; will continue aricept 5 mg daily and namenda xr 21 mg daily   14. Hypoalbuminemia due to protein calorie malnutrition albumin 3.2 will continue supplements as directed  15. Vitamin B 12 deficiency: 2092 is off supplement   16. Postoperative hypothyroidism: tsh 2.457 will continue synthroid 75 mcg daily     Ok Edwards NP [facility]  call [phone]

## 2022-03-13 DIAGNOSIS — Z23 Encounter for immunization: Secondary | ICD-10-CM | POA: Diagnosis not present

## 2022-03-14 DIAGNOSIS — Z9181 History of falling: Secondary | ICD-10-CM | POA: Diagnosis not present

## 2022-03-14 DIAGNOSIS — I69354 Hemiplegia and hemiparesis following cerebral infarction affecting left non-dominant side: Secondary | ICD-10-CM | POA: Diagnosis not present

## 2022-03-14 DIAGNOSIS — R279 Unspecified lack of coordination: Secondary | ICD-10-CM | POA: Diagnosis not present

## 2022-03-14 DIAGNOSIS — M6281 Muscle weakness (generalized): Secondary | ICD-10-CM | POA: Diagnosis not present

## 2022-03-15 DIAGNOSIS — Z9181 History of falling: Secondary | ICD-10-CM | POA: Diagnosis not present

## 2022-03-15 DIAGNOSIS — R279 Unspecified lack of coordination: Secondary | ICD-10-CM | POA: Diagnosis not present

## 2022-03-15 DIAGNOSIS — M6281 Muscle weakness (generalized): Secondary | ICD-10-CM | POA: Diagnosis not present

## 2022-03-15 DIAGNOSIS — I69354 Hemiplegia and hemiparesis following cerebral infarction affecting left non-dominant side: Secondary | ICD-10-CM | POA: Diagnosis not present

## 2022-03-16 DIAGNOSIS — Z9181 History of falling: Secondary | ICD-10-CM | POA: Diagnosis not present

## 2022-03-16 DIAGNOSIS — I69354 Hemiplegia and hemiparesis following cerebral infarction affecting left non-dominant side: Secondary | ICD-10-CM | POA: Diagnosis not present

## 2022-03-16 DIAGNOSIS — R279 Unspecified lack of coordination: Secondary | ICD-10-CM | POA: Diagnosis not present

## 2022-03-16 DIAGNOSIS — M6281 Muscle weakness (generalized): Secondary | ICD-10-CM | POA: Diagnosis not present

## 2022-03-19 DIAGNOSIS — R279 Unspecified lack of coordination: Secondary | ICD-10-CM | POA: Diagnosis not present

## 2022-03-19 DIAGNOSIS — Z9181 History of falling: Secondary | ICD-10-CM | POA: Diagnosis not present

## 2022-03-19 DIAGNOSIS — M6281 Muscle weakness (generalized): Secondary | ICD-10-CM | POA: Diagnosis not present

## 2022-03-19 DIAGNOSIS — I69354 Hemiplegia and hemiparesis following cerebral infarction affecting left non-dominant side: Secondary | ICD-10-CM | POA: Diagnosis not present

## 2022-03-20 DIAGNOSIS — M6281 Muscle weakness (generalized): Secondary | ICD-10-CM | POA: Diagnosis not present

## 2022-03-20 DIAGNOSIS — I69354 Hemiplegia and hemiparesis following cerebral infarction affecting left non-dominant side: Secondary | ICD-10-CM | POA: Diagnosis not present

## 2022-03-20 DIAGNOSIS — Z9181 History of falling: Secondary | ICD-10-CM | POA: Diagnosis not present

## 2022-03-20 DIAGNOSIS — R279 Unspecified lack of coordination: Secondary | ICD-10-CM | POA: Diagnosis not present

## 2022-03-21 DIAGNOSIS — M6281 Muscle weakness (generalized): Secondary | ICD-10-CM | POA: Diagnosis not present

## 2022-03-21 DIAGNOSIS — I69354 Hemiplegia and hemiparesis following cerebral infarction affecting left non-dominant side: Secondary | ICD-10-CM | POA: Diagnosis not present

## 2022-03-21 DIAGNOSIS — R279 Unspecified lack of coordination: Secondary | ICD-10-CM | POA: Diagnosis not present

## 2022-03-21 DIAGNOSIS — Z9181 History of falling: Secondary | ICD-10-CM | POA: Diagnosis not present

## 2022-03-22 DIAGNOSIS — M6281 Muscle weakness (generalized): Secondary | ICD-10-CM | POA: Diagnosis not present

## 2022-03-22 DIAGNOSIS — R279 Unspecified lack of coordination: Secondary | ICD-10-CM | POA: Diagnosis not present

## 2022-03-22 DIAGNOSIS — I69354 Hemiplegia and hemiparesis following cerebral infarction affecting left non-dominant side: Secondary | ICD-10-CM | POA: Diagnosis not present

## 2022-03-22 DIAGNOSIS — Z9181 History of falling: Secondary | ICD-10-CM | POA: Diagnosis not present

## 2022-03-23 DIAGNOSIS — I69354 Hemiplegia and hemiparesis following cerebral infarction affecting left non-dominant side: Secondary | ICD-10-CM | POA: Diagnosis not present

## 2022-03-23 DIAGNOSIS — R279 Unspecified lack of coordination: Secondary | ICD-10-CM | POA: Diagnosis not present

## 2022-03-23 DIAGNOSIS — M6281 Muscle weakness (generalized): Secondary | ICD-10-CM | POA: Diagnosis not present

## 2022-03-23 DIAGNOSIS — Z9181 History of falling: Secondary | ICD-10-CM | POA: Diagnosis not present

## 2022-03-26 DIAGNOSIS — Z9181 History of falling: Secondary | ICD-10-CM | POA: Diagnosis not present

## 2022-03-26 DIAGNOSIS — M6281 Muscle weakness (generalized): Secondary | ICD-10-CM | POA: Diagnosis not present

## 2022-03-26 DIAGNOSIS — I69354 Hemiplegia and hemiparesis following cerebral infarction affecting left non-dominant side: Secondary | ICD-10-CM | POA: Diagnosis not present

## 2022-03-26 DIAGNOSIS — R279 Unspecified lack of coordination: Secondary | ICD-10-CM | POA: Diagnosis not present

## 2022-03-27 ENCOUNTER — Encounter: Payer: Self-pay | Admitting: Internal Medicine

## 2022-03-27 ENCOUNTER — Non-Acute Institutional Stay (SKILLED_NURSING_FACILITY): Payer: Medicare Other | Admitting: Internal Medicine

## 2022-03-27 DIAGNOSIS — I69354 Hemiplegia and hemiparesis following cerebral infarction affecting left non-dominant side: Secondary | ICD-10-CM | POA: Diagnosis not present

## 2022-03-27 DIAGNOSIS — J849 Interstitial pulmonary disease, unspecified: Secondary | ICD-10-CM

## 2022-03-27 DIAGNOSIS — Z9181 History of falling: Secondary | ICD-10-CM | POA: Diagnosis not present

## 2022-03-27 DIAGNOSIS — G8194 Hemiplegia, unspecified affecting left nondominant side: Secondary | ICD-10-CM

## 2022-03-27 DIAGNOSIS — F0150 Vascular dementia without behavioral disturbance: Secondary | ICD-10-CM | POA: Diagnosis not present

## 2022-03-27 DIAGNOSIS — I10 Essential (primary) hypertension: Secondary | ICD-10-CM | POA: Diagnosis not present

## 2022-03-27 DIAGNOSIS — M6281 Muscle weakness (generalized): Secondary | ICD-10-CM | POA: Diagnosis not present

## 2022-03-27 DIAGNOSIS — R279 Unspecified lack of coordination: Secondary | ICD-10-CM | POA: Diagnosis not present

## 2022-03-27 NOTE — Progress Notes (Signed)
NURSING HOME LOCATION:  [facility] ROOM NUMBER:  [id number] P  CODE STATUS:  DNR  PCP:  Ok Edwards NP,PSC  This is a nursing facility follow up visit of chronic medical diagnoses & to document compliance with Regulation 483.30 (c) in [employer] [place] Manual Phase 2 which mandates caregiver visit ( visits can alternate among physician, PA or NP as per statutes) within 10 days of 30 days / 60 days/ 90 days post admission to SNF date    Interim medical record and care since last SNF visit was updated with review of diagnostic studies and change in clinical status since last visit were documented.  HPI: She is a permanent resident of facility with history of stroke with left hemiparesis and dysphagia, essential hypertension, interstitial lung disease, GERD without esophagitis, hypothyroidism, CKD, and mixed dyslipidemia.  Current labs reveal slight progression of CKD with creatinine rising from 0.81 up to 0.98 and GFR dropping from greater than 60 down to 56 indicating stage IIIa CKD.  She has a history of B12 deficiency but her most recent value was supratherapeutic at 2092.  There is no anemia , H/H values 14.8/44.7.  A1c is prediabetic at 5.5%.  TSH is therapeutic at 2.305.  Review of systems: She does have a neurocognitive deficit and her twin daughters provided most of the history.  They stated they have been working with the transfers in and out of their automobile and they are seeing definite improvement.  The patient states that "I cannot walk"; she seemed to indicate difficulty with both legs although she has left hemiparesis.  She then stated "my right leg is pretty good."  She has numbness and tingling in her feet occasionally.  She describes occasional dyspepsia.  The daughters validate that any dysphagia has also improved significantly.  Constitutional: No fever, significant weight change, fatigue  Eyes: No redness, discharge, pain, vision change ENT/mouth: No  nasal congestion,  purulent discharge, earache, change in hearing, sore throat  Cardiovascular: No chest pain, palpitations, paroxysmal nocturnal dyspnea, claudication, edema  Respiratory: No cough, sputum production, hemoptysis, DOE, significant snoring, apnea   Gastrointestinal: No  abdominal pain, nausea /vomiting, rectal bleeding, melena, change in bowels Genitourinary: No dysuria, hematuria, pyuria, incontinence, nocturia Musculoskeletal: No joint stiffness, joint swelling, weakness, pain Dermatologic: No rash, pruritus, change in appearance of skin Neurologic: No dizziness, headache, syncope, seizures Psychiatric: No significant anxiety, depression, insomnia, anorexia Endocrine: No change in hair/skin/nails, excessive thirst, excessive hunger, excessive urination  Hematologic/lymphatic: No significant bruising, lymphadenopathy, abnormal bleeding Allergy/immunology: No itchy/watery eyes, significant sneezing, urticaria, angioedema  Physical exam:  Pertinent or positive findings: She appears her age.  Lower lids are puffy.  There is an operative scar over the anterior neck which is well-healed.  She has dry rales greater in the right lower lobe than the left lower lobe.  She has a grade 1/2 systolic murmur at the lower left sternal border.  First heart sound is increased.  Abdomen is protuberant.  She has 1/2+ edema at the sock line.  Pedal pulses are decreased.  The right lower extremity is stronger to opposition the left lower extremity.  The left upper extremity is in a sling.  The left hand is clawed.  General appearance: Adequately nourished; no acute distress, increased work of breathing is present.   Lymphatic: No lymphadenopathy about the head, neck, axilla. Eyes: No conjunctival inflammation or lid edema is present. There is no scleral icterus. Ears:  External ear exam shows  no significant lesions or deformities.   Nose:  External nasal examination shows no deformity or inflammation.  Nasal mucosa are pink and moist without lesions, exudates Oral exam:  Lips and gums are healthy appearing. There is no oropharyngeal erythema or exudate. Neck:  No thyromegaly, masses, tenderness noted.    Heart:  Normal rate and regular rhythm without gallop, click, rub .  Lungs: without wheezes, rhonchi, rubs. Abdomen: Bowel sounds are normal. Abdomen is soft and nontender with no organomegaly, hernias, masses. GU: Deferred  Extremities:  No cyanosis, clubbing  Neurologic exam :Balance, Rhomberg, finger to nose testing could not be completed due to clinical state Skin: Warm & dry w/o tenting. No significant lesions or rash.  See summary under each active problem in the Problem List with associated updated therapeutic plan

## 2022-03-28 DIAGNOSIS — M6281 Muscle weakness (generalized): Secondary | ICD-10-CM | POA: Diagnosis not present

## 2022-03-28 DIAGNOSIS — Z9181 History of falling: Secondary | ICD-10-CM | POA: Diagnosis not present

## 2022-03-28 DIAGNOSIS — R279 Unspecified lack of coordination: Secondary | ICD-10-CM | POA: Diagnosis not present

## 2022-03-28 DIAGNOSIS — I69354 Hemiplegia and hemiparesis following cerebral infarction affecting left non-dominant side: Secondary | ICD-10-CM | POA: Diagnosis not present

## 2022-03-28 NOTE — Assessment & Plan Note (Signed)
O2 sats good on room air.  Daughters were aware of the diagnosis of cardiomyopathy but again did not validate any knowledge of a diagnosis of IPF.  Clinically she is stable and no further intervention is indicated at this time.

## 2022-03-28 NOTE — Assessment & Plan Note (Addendum)
Daughters are still her primary historians; but she is much more alert and interactive than when previously evaluated.No behavioral issues reported.

## 2022-03-28 NOTE — Assessment & Plan Note (Addendum)
BP controlled w/o antihypertensive medications  

## 2022-03-28 NOTE — Assessment & Plan Note (Signed)
Daughters validate PT/OT assessment of improvement with rehab.  Focus was on automobile transfer today.

## 2022-03-28 NOTE — Patient Instructions (Signed)
See assessment and plan under each diagnosis in the problem list and acutely for this visit 

## 2022-03-29 DIAGNOSIS — I69354 Hemiplegia and hemiparesis following cerebral infarction affecting left non-dominant side: Secondary | ICD-10-CM | POA: Diagnosis not present

## 2022-03-29 DIAGNOSIS — Z9181 History of falling: Secondary | ICD-10-CM | POA: Diagnosis not present

## 2022-03-29 DIAGNOSIS — M6281 Muscle weakness (generalized): Secondary | ICD-10-CM | POA: Diagnosis not present

## 2022-03-29 DIAGNOSIS — R279 Unspecified lack of coordination: Secondary | ICD-10-CM | POA: Diagnosis not present

## 2022-03-30 DIAGNOSIS — M6281 Muscle weakness (generalized): Secondary | ICD-10-CM | POA: Diagnosis not present

## 2022-03-30 DIAGNOSIS — I69354 Hemiplegia and hemiparesis following cerebral infarction affecting left non-dominant side: Secondary | ICD-10-CM | POA: Diagnosis not present

## 2022-03-30 DIAGNOSIS — R279 Unspecified lack of coordination: Secondary | ICD-10-CM | POA: Diagnosis not present

## 2022-03-30 DIAGNOSIS — Z9181 History of falling: Secondary | ICD-10-CM | POA: Diagnosis not present

## 2022-04-02 DIAGNOSIS — I69354 Hemiplegia and hemiparesis following cerebral infarction affecting left non-dominant side: Secondary | ICD-10-CM | POA: Diagnosis not present

## 2022-04-02 DIAGNOSIS — R279 Unspecified lack of coordination: Secondary | ICD-10-CM | POA: Diagnosis not present

## 2022-04-02 DIAGNOSIS — Z9181 History of falling: Secondary | ICD-10-CM | POA: Diagnosis not present

## 2022-04-02 DIAGNOSIS — M6281 Muscle weakness (generalized): Secondary | ICD-10-CM | POA: Diagnosis not present

## 2022-04-03 DIAGNOSIS — R279 Unspecified lack of coordination: Secondary | ICD-10-CM | POA: Diagnosis not present

## 2022-04-03 DIAGNOSIS — M6281 Muscle weakness (generalized): Secondary | ICD-10-CM | POA: Diagnosis not present

## 2022-04-03 DIAGNOSIS — I69354 Hemiplegia and hemiparesis following cerebral infarction affecting left non-dominant side: Secondary | ICD-10-CM | POA: Diagnosis not present

## 2022-04-03 DIAGNOSIS — Z9181 History of falling: Secondary | ICD-10-CM | POA: Diagnosis not present

## 2022-04-04 DIAGNOSIS — Z9181 History of falling: Secondary | ICD-10-CM | POA: Diagnosis not present

## 2022-04-04 DIAGNOSIS — R279 Unspecified lack of coordination: Secondary | ICD-10-CM | POA: Diagnosis not present

## 2022-04-04 DIAGNOSIS — M6281 Muscle weakness (generalized): Secondary | ICD-10-CM | POA: Diagnosis not present

## 2022-04-04 DIAGNOSIS — I69354 Hemiplegia and hemiparesis following cerebral infarction affecting left non-dominant side: Secondary | ICD-10-CM | POA: Diagnosis not present

## 2022-04-05 DIAGNOSIS — Z9181 History of falling: Secondary | ICD-10-CM | POA: Diagnosis not present

## 2022-04-05 DIAGNOSIS — R279 Unspecified lack of coordination: Secondary | ICD-10-CM | POA: Diagnosis not present

## 2022-04-05 DIAGNOSIS — I69354 Hemiplegia and hemiparesis following cerebral infarction affecting left non-dominant side: Secondary | ICD-10-CM | POA: Diagnosis not present

## 2022-04-05 DIAGNOSIS — M6281 Muscle weakness (generalized): Secondary | ICD-10-CM | POA: Diagnosis not present

## 2022-04-06 DIAGNOSIS — M6281 Muscle weakness (generalized): Secondary | ICD-10-CM | POA: Diagnosis not present

## 2022-04-06 DIAGNOSIS — I69354 Hemiplegia and hemiparesis following cerebral infarction affecting left non-dominant side: Secondary | ICD-10-CM | POA: Diagnosis not present

## 2022-04-06 DIAGNOSIS — Z9181 History of falling: Secondary | ICD-10-CM | POA: Diagnosis not present

## 2022-04-06 DIAGNOSIS — R279 Unspecified lack of coordination: Secondary | ICD-10-CM | POA: Diagnosis not present

## 2022-04-09 DIAGNOSIS — I69354 Hemiplegia and hemiparesis following cerebral infarction affecting left non-dominant side: Secondary | ICD-10-CM | POA: Diagnosis not present

## 2022-04-09 DIAGNOSIS — M6281 Muscle weakness (generalized): Secondary | ICD-10-CM | POA: Diagnosis not present

## 2022-04-09 DIAGNOSIS — Z9181 History of falling: Secondary | ICD-10-CM | POA: Diagnosis not present

## 2022-04-09 DIAGNOSIS — R279 Unspecified lack of coordination: Secondary | ICD-10-CM | POA: Diagnosis not present

## 2022-04-10 DIAGNOSIS — Z9181 History of falling: Secondary | ICD-10-CM | POA: Diagnosis not present

## 2022-04-10 DIAGNOSIS — I69354 Hemiplegia and hemiparesis following cerebral infarction affecting left non-dominant side: Secondary | ICD-10-CM | POA: Diagnosis not present

## 2022-04-10 DIAGNOSIS — R279 Unspecified lack of coordination: Secondary | ICD-10-CM | POA: Diagnosis not present

## 2022-04-10 DIAGNOSIS — M6281 Muscle weakness (generalized): Secondary | ICD-10-CM | POA: Diagnosis not present

## 2022-04-11 DIAGNOSIS — I69354 Hemiplegia and hemiparesis following cerebral infarction affecting left non-dominant side: Secondary | ICD-10-CM | POA: Diagnosis not present

## 2022-04-11 DIAGNOSIS — Z9181 History of falling: Secondary | ICD-10-CM | POA: Diagnosis not present

## 2022-04-11 DIAGNOSIS — M6281 Muscle weakness (generalized): Secondary | ICD-10-CM | POA: Diagnosis not present

## 2022-04-11 DIAGNOSIS — R279 Unspecified lack of coordination: Secondary | ICD-10-CM | POA: Diagnosis not present

## 2022-04-13 ENCOUNTER — Encounter: Payer: Self-pay | Admitting: Adult Health

## 2022-04-13 ENCOUNTER — Non-Acute Institutional Stay (SKILLED_NURSING_FACILITY): Payer: Medicare Other | Admitting: Adult Health

## 2022-04-13 DIAGNOSIS — J849 Interstitial pulmonary disease, unspecified: Secondary | ICD-10-CM | POA: Diagnosis not present

## 2022-04-13 DIAGNOSIS — I700 Atherosclerosis of aorta: Secondary | ICD-10-CM | POA: Diagnosis not present

## 2022-04-13 DIAGNOSIS — G8194 Hemiplegia, unspecified affecting left nondominant side: Secondary | ICD-10-CM

## 2022-04-26 LAB — BASIC METABOLIC PANEL
BUN: 24 — AB (ref 4–21)
CO2: 29 — AB (ref 13–22)
Glucose: 96
Sodium: 140 (ref 137–147)

## 2022-04-26 LAB — CBC AND DIFFERENTIAL
Neutrophils Absolute: 5.2
Platelets: 204 10*3/uL (ref 150–400)

## 2022-04-26 LAB — HEMOGLOBIN A1C: Hemoglobin A1C: 6.1

## 2022-04-26 LAB — CBC: RBC: 3.68 — AB (ref 3.87–5.11)

## 2022-04-26 LAB — HEPATIC FUNCTION PANEL: Bilirubin, Total: 0.2

## 2022-05-03 ENCOUNTER — Non-Acute Institutional Stay (SKILLED_NURSING_FACILITY): Payer: Medicare Other | Admitting: Adult Health

## 2022-05-03 ENCOUNTER — Encounter: Payer: Self-pay | Admitting: Adult Health

## 2022-05-03 DIAGNOSIS — J849 Interstitial pulmonary disease, unspecified: Secondary | ICD-10-CM

## 2022-05-03 DIAGNOSIS — R059 Cough, unspecified: Secondary | ICD-10-CM | POA: Diagnosis not present

## 2022-05-03 NOTE — Progress Notes (Signed)
Location:  [facility] Room Number: 138-P Place of Service:  SNF (31)   CODE STATUS: DNR  Allergies  Allergen Reactions   Hydrochlorothiazide     05/29/2021 K+ 2.6 Other reaction(s): Other (See Comments) 05/29/2021 K+ 2.6   Codeine Nausea Only    Chief Complaint  Patient presents with   Acute Visit    Cough    HPI:  She has had a cough for the past several days. The cough is loose but nonproductive. There are no reports of fevers present. No shortness of breath no chest pain. No sore throat.   Past Medical History:  Diagnosis Date   Decreased sense of smell    and taste-negative CT scan-2011   Depression    Essential hypertension    GERD (gastroesophageal reflux disease)    Heart murmur    heard first time 4/12   History of mammogram    2010 and she does not want to repeat them anymore   HOCM (hypertrophic obstructive cardiomyopathy) ([place])    a. Dx by echo 05/2014.   Hypothyroidism    IBS (irritable bowel syndrome)    Impaired fasting glucose    Low back pain    Lumbar radiculopathy    with negative MRI (1991)   Postmenopausal    with osteopenia, bisphosphonates 1/05-1/11, improved osteopenia 4/12-repeat planned late 2015   Shingles    2015   Thyroid nodule    Vitamin D deficiency    repleted on weekly vitamin D 50000 iu    Past Surgical History:  Procedure Laterality Date   ABDOMINAL HYSTERECTOMY     BREAST BIOPSY     x2   CATARACT EXTRACTION     removal with IOL bilateral   GLAUCOMA SURGERY     laser   skin cancer removal     THYROIDECTOMY     subtotal    Social History   Socioeconomic History   Marital status: Married    Spouse name: Not on file   Number of children: Not on file   Years of education: Not on file   Highest education level: Not on file  Occupational History   Not on file  Tobacco Use   Smoking status: Never   Smokeless tobacco: Never  Vaping Use   Vaping Use: Never used  Substance and Sexual  Activity   Alcohol use: No    Alcohol/week: 0.0 standard drinks of alcohol   Drug use: Never   Sexual activity: Not on file  Other Topics Concern   Not on file  Social History Narrative   Not on file   Social Determinants of Health   Financial Resource Strain: Not on file  Food Insecurity: Not on file  Transportation Needs: Not on file  Physical Activity: Not on file  Stress: Not on file  Social Connections: Not on file  Intimate Partner Violence: Not on file   Family History  Problem Relation Age of Onset   Breast cancer Mother    Hypertension Father    CVA Father    Heart failure Sister    CAD Sister    Kidney disease Sister    COPD Brother    Lung cancer Brother    CAD Brother    Diabetes Brother    Stroke Father    Heart attack Neg Hx       VITAL SIGNS BP 139/79   Pulse 72   Temp 97.7 F (36.5 C)   Resp 20  Ht '5\' 2"'$  (1.575 m)   Wt 136 lb 9.6 oz (62 kg)   SpO2 95%   BMI 24.98 kg/m   Outpatient Encounter Medications as of 05/03/2022  Medication Sig   acetaminophen (TYLENOL) 325 MG tablet Take 2 tablets (650 mg total) by mouth every 4 (four) hours as needed for mild pain (or temp > 37.5 C (99.5 F)).   amantadine (SYMMETREL) 50 MG/5ML solution Take 50 mg by mouth 2 (two) times daily. With breakfast and lunch   Amino Acids-Protein Hydrolys (FEEDING SUPPLEMENT, PRO-STAT SUGAR FREE 64,) LIQD Take 30 mLs by mouth 3 (three) times daily with meals. for albumin 2.8  15-100 gram-kcal/30 mL; amt: 60 cc; oral Special Instructions: for albumin of 2.8 with meal Once A Day   aspirin EC 81 MG EC tablet Take 1 tablet (81 mg total) by mouth daily with breakfast. Swallow whole.   Cholecalciferol (VITAMIN D-3) 25 MCG (1000 UT) CAPS Take 1 capsule by mouth daily.   donepezil (ARICEPT) 5 MG tablet Take 1 tablet (5 mg total) by mouth at bedtime.   famotidine (PEPCID) 20 MG tablet Take 1 tablet (20 mg total) by mouth daily.   levothyroxine (SYNTHROID, LEVOTHROID) 75 MCG  tablet Take 75 mcg by mouth daily before breakfast.    memantine (NAMENDA XR) 28 MG CP24 24 hr capsule Take 28 mg by mouth daily.   Menthol, Topical Analgesic, (BIOFREEZE) 4 % GEL Apply topically as needed. Left shoulder pain   NON FORMULARY Diet:  Regular, thin liquids- Add prune juice to Breakfast tray   oxymetazoline (AFRIN) 0.05 % nasal spray Place 1 spray into both nostrils as needed. epistaxis   polyethylene glycol (MIRALAX / GLYCOLAX) 17 g packet Take 17 g by mouth daily.   sertraline (ZOLOFT) 25 MG tablet Take 25 mg by mouth daily.  to be given with '50mg'$  to = '75mg'$    sertraline (ZOLOFT) 50 MG tablet Take 50 mg by mouth daily. to be given with '25mg'$  to = '75mg'$    sodium chloride (OCEAN) 0.65 % SOLN nasal spray Place 1 spray into both nostrils 3 (three) times daily. And as needed   vitamin C (ASCORBIC ACID) 500 MG tablet Take 500 mg by mouth daily.   [DISCONTINUED] Cholecalciferol (VITAMIN D3) 1.25 MG (50000 UT) CAPS Take 1 capsule by mouth once a week.   [DISCONTINUED] sertraline (ZOLOFT) 100 MG tablet Take 100 mg by mouth daily.   No facility-administered encounter medications on file as of 05/03/2022.     SIGNIFICANT DIAGNOSTIC EXAMS  PREVIOUS   05-29-21: ct of head code stroke:  No evidence of acute intracranial abnormality. ASPECTS is 10. Mild generalized cerebral atrophy. Paranasal sinus disease at the imaged levels, as described.  05-29-21: ct angio of head and neck:  CTA neck: 1. The common carotid and internal carotid arteries are patent within the neck without stenosis. Mild atherosclerotic plaque within both carotid systems within the neck, as described. 2. Vertebral arteries patent within the neck. Mild-to-moderate stenosis within the distal V2 segment of the non-dominant left vertebral artery. 3. Mild nonspecific fusiform dilation of the distal cervical left ICA to 7 mm. CTA head: 1. No intracranial large vessel occlusion or proximal high-grade arterial stenosis  identified. 2. Intracranial atherosclerotic disease, as described. 3. 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 At least one branch vessel arises from this aneurysm. 4. 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  05-29-21: MRI of brain:  1. Acute  right pontine infarct. Mild associated edema without mass effect. 2. Multiple small remote bilateral cerebellar lacunar infarcts. 3. Mild for age chronic microvascular ischemic disease and moderate atrophy.  05-29-21: 2-d echo:  1. No SAM noted mid/apical cavity obliteration in systole with some flow  acceleration peak velocity in 65msec range . Left ventricular ejection  fraction, by estimation, is 60 to 65%. The left ventricle has normal  function. The left ventricle has no  regional wall motion abnormalities. There is severe left ventricular  hypertrophy. Left ventricular diastolic parameters are consistent with  Grade I diastolic dysfunction (impaired relaxation).   05-30-21: EEG This study is suggestive of mild diffuse encephalopathy, nonspecific etiology. No seizures or epileptiform discharges were seen throughout the recording.   NO NEW EXAMS  LABS REVIEWED PREVIOUS   05-29-21: wbc 21.3; hgb 15.6; hct 45.5; mcv 87.2 plt 317; glucose 137; bun 18; creat 0.90; k+ 2.6; na++ 136; ca 8.9; GFR>60; liver normal albumin 4.2; mag 1.8; tsh 2.093; vit B 12: 584 vit D 36.20 05-30-21: hgb a1c 5.7; chol 203; ldl 127; trig 196 hdl 37 06-16-21: wbc 10.7; hb 13.4; hct 40.3; mcv 90.6 plt 250; glucose 105; bun 33; creat 1.03; k+ 4.2; na++ 137; ca 9.1; GFR 53 06-26-21: wbc 7.5; hgb 12.7; hct 38.2; mcv 90.7 plt 22; glucose 112; bun 29; creat 1.07; k+ 3.9; na++ 139; ca 9,4; GFR 50  06-27-21: urine culture: 20,000 proteus mirabilis  07-12-21: wbc 14.9; hgb 14.4; hct 42.8; mcv 93.0 plt 271; glucose 128; bun 27; creat 0.95; k+ 3.2; na++ 139; ca 9.1; GFR 58; liver normal albumin 3.6 07-28-21: wbc 9.5; hgb 12.8; hct  38.4; mcv 993.7 plt 237; glucose 102; bun 25; creat 0.81; k+ 3.3; na++ 139; ca 8.2 GFR>60; liver normal albumin 3.2; tsh 2.457 vit B 12: 261; folate 9. 09-07-21 k+ 3.5 02-12-22: wbc 7.8; hgb 14.8; hct 44.7; mcv 89.2 plt 221; glucose 180; bun 24; creat 0.89; k+ 4.0; na++ 141; ca 9.0; GFR 56; protein 6.6; albumin 3.6; hgb a1c 5.5; tsh 2.305 free t4: 0.94; vitamin B12: 2092; vitamin B6: 72.2 vitamin D 24.4    NO NEW LABS.    Review of Systems  Constitutional:  Negative for malaise/fatigue.  Respiratory:  Positive for cough. Negative for sputum production and shortness of breath.   Cardiovascular:  Negative for chest pain, palpitations and leg swelling.  Gastrointestinal:  Negative for abdominal pain, constipation and heartburn.  Musculoskeletal:  Negative for back pain, joint pain and myalgias.  Skin: Negative.   Neurological:  Negative for dizziness.  Psychiatric/Behavioral:  The patient is not nervous/anxious.    Physical Exam Constitutional:      General: She is not in acute distress.    Appearance: She is well-developed. She is not diaphoretic.  Neck:     Thyroid: No thyromegaly.  Cardiovascular:     Rate and Rhythm: Normal rate and regular rhythm.     Pulses: Normal pulses.     Heart sounds: Normal heart sounds.  Pulmonary:     Effort: Pulmonary effort is normal. No respiratory distress.     Breath sounds: Normal breath sounds.  Abdominal:     General: Bowel sounds are normal. There is no distension.     Palpations: Abdomen is soft.     Tenderness: There is no abdominal tenderness.  Musculoskeletal:     Cervical back: Neck supple.     Right lower leg: No edema.     Left lower leg: No edema.  Comments: Left hemiplegia     Lymphadenopathy:     Cervical: No cervical adenopathy.  Skin:    General: Skin is warm and dry.  Neurological:     Mental Status: She is alert. Mental status is at baseline.  Psychiatric:        Mood and Affect: Mood normal.       ASSESSMENT/  PLAN:  TODAY  Interstitial lung disease: will begin mucinex 600 mg twice daily for one week and will get a chest x-ray.    Ok Edwards NP [facility]  call [phone]

## 2022-05-09 ENCOUNTER — Encounter: Payer: Self-pay | Admitting: Adult Health

## 2022-05-09 ENCOUNTER — Non-Acute Institutional Stay (SKILLED_NURSING_FACILITY): Payer: Medicare Other | Admitting: Adult Health

## 2022-05-09 DIAGNOSIS — G8194 Hemiplegia, unspecified affecting left nondominant side: Secondary | ICD-10-CM | POA: Diagnosis not present

## 2022-05-09 DIAGNOSIS — I700 Atherosclerosis of aorta: Secondary | ICD-10-CM

## 2022-05-09 DIAGNOSIS — K5909 Other constipation: Secondary | ICD-10-CM

## 2022-05-09 DIAGNOSIS — Z8673 Personal history of transient ischemic attack (TIA), and cerebral infarction without residual deficits: Secondary | ICD-10-CM | POA: Diagnosis not present

## 2022-05-09 DIAGNOSIS — K219 Gastro-esophageal reflux disease without esophagitis: Secondary | ICD-10-CM

## 2022-05-09 NOTE — Progress Notes (Signed)
Location:  [facility] Room Number: 138/P Place of Service:  SNF (31)   CODE STATUS: DNR  Allergies  Allergen Reactions   Hydrochlorothiazide     05/29/2021 K+ 2.6 Other reaction(s): Other (See Comments) 05/29/2021 K+ 2.6   Codeine Nausea Only    Chief Complaint  Patient presents with   Medical Management of Chronic Issues                                          GERD without esophagitis:  Chronic constipation:   Thoracic aortic atherosclerosis:  History of stroke/ left hemiplegia:    HPI:  She is a 88 year old long term resident of this facility being seen for the management of her chronic illnesses:  GERD without esophagitis:  Chronic constipation:   Thoracic aortic atherosclerosis:  History of stroke/ left hemiplegia. There are no reports of uncontrolled pain. Her cough has resolved. There are no reports of changes in appetite weight is stable.   Past Medical History:  Diagnosis Date   Decreased sense of smell    and taste-negative CT scan-2011   Depression    Essential hypertension    GERD (gastroesophageal reflux disease)    Heart murmur    heard first time 4/12   History of mammogram    2010 and she does not want to repeat them anymore   HOCM (hypertrophic obstructive cardiomyopathy) ([place])    a. Dx by echo 05/2014.   Hypothyroidism    IBS (irritable bowel syndrome)    Impaired fasting glucose    Low back pain    Lumbar radiculopathy    with negative MRI (1991)   Postmenopausal    with osteopenia, bisphosphonates 1/05-1/11, improved osteopenia 4/12-repeat planned late 2015   Shingles    2015   Thyroid nodule    Vitamin D deficiency    repleted on weekly vitamin D 50000 iu    Past Surgical History:  Procedure Laterality Date   ABDOMINAL HYSTERECTOMY     BREAST BIOPSY     x2   CATARACT EXTRACTION     removal with IOL bilateral   GLAUCOMA SURGERY     laser   skin cancer removal     THYROIDECTOMY     subtotal    Social History    Socioeconomic History   Marital status: Married    Spouse name: Not on file   Number of children: Not on file   Years of education: Not on file   Highest education level: Not on file  Occupational History   Not on file  Tobacco Use   Smoking status: Never   Smokeless tobacco: Never  Vaping Use   Vaping Use: Never used  Substance and Sexual Activity   Alcohol use: No    Alcohol/week: 0.0 standard drinks of alcohol   Drug use: Never   Sexual activity: Not on file  Other Topics Concern   Not on file  Social History Narrative   Not on file   Social Determinants of Health   Financial Resource Strain: Not on file  Food Insecurity: Not on file  Transportation Needs: Not on file  Physical Activity: Not on file  Stress: Not on file  Social Connections: Not on file  Intimate Partner Violence: Not on file   Family History  Problem Relation Age of Onset   Breast cancer Mother  Hypertension Father    CVA Father    Heart failure Sister    CAD Sister    Kidney disease Sister    COPD Brother    Lung cancer Brother    CAD Brother    Diabetes Brother    Stroke Father    Heart attack Neg Hx       VITAL SIGNS BP (!) 143/74   Pulse 72   Temp (!) 97.4 F (36.3 C)   Resp 20   Ht '5\' 2"'$  (1.575 m)   Wt 136 lb 9.6 oz (62 kg)   SpO2 96%   BMI 24.98 kg/m   Outpatient Encounter Medications as of 05/09/2022  Medication Sig   acetaminophen (TYLENOL) 325 MG tablet Take 2 tablets (650 mg total) by mouth every 4 (four) hours as needed for mild pain (or temp > 37.5 C (99.5 F)).   amantadine (SYMMETREL) 50 MG/5ML solution Take 50 mg by mouth 2 (two) times daily. With breakfast and lunch   Amino Acids-Protein Hydrolys (FEEDING SUPPLEMENT, PRO-STAT SUGAR FREE 64,) LIQD Take 30 mLs by mouth 3 (three) times daily with meals. for albumin 2.8  15-100 gram-kcal/30 mL; amt: 60 cc; oral Special Instructions: for albumin of 2.8 with meal Once A Day   aspirin EC 81 MG EC tablet Take 1  tablet (81 mg total) by mouth daily with breakfast. Swallow whole.   Cholecalciferol (VITAMIN D-3) 25 MCG (1000 UT) CAPS Take 1 capsule by mouth daily.   donepezil (ARICEPT) 5 MG tablet Take 1 tablet (5 mg total) by mouth at bedtime.   famotidine (PEPCID) 20 MG tablet Take 1 tablet (20 mg total) by mouth daily.   levothyroxine (SYNTHROID, LEVOTHROID) 75 MCG tablet Take 75 mcg by mouth daily before breakfast.    memantine (NAMENDA XR) 28 MG CP24 24 hr capsule Take 28 mg by mouth daily.   Menthol, Topical Analgesic, (BIOFREEZE) 4 % GEL Apply topically as needed. Left shoulder pain   NON FORMULARY Diet:  Regular, thin liquids- Add prune juice to Breakfast tray   oxymetazoline (AFRIN) 0.05 % nasal spray Place 1 spray into both nostrils as needed. epistaxis   polyethylene glycol (MIRALAX / GLYCOLAX) 17 g packet Take 17 g by mouth daily.   sertraline (ZOLOFT) 25 MG tablet Take 25 mg by mouth daily.  to be given with '50mg'$  to = '75mg'$    sertraline (ZOLOFT) 50 MG tablet Take 50 mg by mouth daily. to be given with '25mg'$  to = '75mg'$    sodium chloride (OCEAN) 0.65 % SOLN nasal spray Place 1 spray into both nostrils 3 (three) times daily. And as needed   vitamin C (ASCORBIC ACID) 500 MG tablet Take 500 mg by mouth daily.   No facility-administered encounter medications on file as of 05/09/2022.     SIGNIFICANT DIAGNOSTIC EXAMS  PREVIOUS   05-29-21: ct of head code stroke:  No evidence of acute intracranial abnormality. ASPECTS is 10. Mild generalized cerebral atrophy. Paranasal sinus disease at the imaged levels, as described.  05-29-21: ct angio of head and neck:  CTA neck: 1. The common carotid and internal carotid arteries are patent within the neck without stenosis. Mild atherosclerotic plaque within both carotid systems within the neck, as described. 2. Vertebral arteries patent within the neck. Mild-to-moderate stenosis within the distal V2 segment of the non-dominant left vertebral artery. 3. Mild  nonspecific fusiform dilation of the distal cervical left ICA to 7 mm. CTA head: 1. No intracranial large vessel occlusion or proximal  high-grade arterial stenosis identified. 2. Intracranial atherosclerotic disease, as described. 3. 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 At least one branch vessel arises from this aneurysm. 4. 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  05-29-21: MRI of brain:  1. Acute right pontine infarct. Mild associated edema without mass effect. 2. Multiple small remote bilateral cerebellar lacunar infarcts. 3. Mild for age chronic microvascular ischemic disease and moderate atrophy.  05-29-21: 2-d echo:  1. No SAM noted mid/apical cavity obliteration in systole with some flow  acceleration peak velocity in 12msec range . Left ventricular ejection  fraction, by estimation, is 60 to 65%. The left ventricle has normal  function. The left ventricle has no  regional wall motion abnormalities. There is severe left ventricular  hypertrophy. Left ventricular diastolic parameters are consistent with  Grade I diastolic dysfunction (impaired relaxation).   05-30-21: EEG This study is suggestive of mild diffuse encephalopathy, nonspecific etiology. No seizures or epileptiform discharges were seen throughout the recording.   NO NEW EXAMS  LABS REVIEWED PREVIOUS   05-29-21: wbc 21.3; hgb 15.6; hct 45.5; mcv 87.2 plt 317; glucose 137; bun 18; creat 0.90; k+ 2.6; na++ 136; ca 8.9; GFR>60; liver normal albumin 4.2; mag 1.8; tsh 2.093; vit B 12: 584 vit D 36.20 05-30-21: hgb a1c 5.7; chol 203; ldl 127; trig 196 hdl 37 06-16-21: wbc 10.7; hb 13.4; hct 40.3; mcv 90.6 plt 250; glucose 105; bun 33; creat 1.03; k+ 4.2; na++ 137; ca 9.1; GFR 53 06-26-21: wbc 7.5; hgb 12.7; hct 38.2; mcv 90.7 plt 22; glucose 112; bun 29; creat 1.07; k+ 3.9; na++ 139; ca 9,4; GFR 50  06-27-21: urine culture: 20,000 proteus mirabilis  07-12-21: wbc 14.9;  hgb 14.4; hct 42.8; mcv 93.0 plt 271; glucose 128; bun 27; creat 0.95; k+ 3.2; na++ 139; ca 9.1; GFR 58; liver normal albumin 3.6 07-28-21: wbc 9.5; hgb 12.8; hct 38.4; mcv 993.7 plt 237; glucose 102; bun 25; creat 0.81; k+ 3.3; na++ 139; ca 8.2 GFR>60; liver normal albumin 3.2; tsh 2.457 vit B 12: 261; folate 9. 09-07-21 k+ 3.5 02-12-22: wbc 7.8; hgb 14.8; hct 44.7; mcv 89.2 plt 221; glucose 180; bun 24; creat 0.89; k+ 4.0; na++ 141; ca 9.0; GFR 56; protein 6.6; albumin 3.6; hgb a1c 5.5; tsh 2.305 free t4: 0.94; vitamin B12: 2092; vitamin B6: 72.2 vitamin D 24.4    NO NEW LABS.   Review of Systems  Constitutional:  Negative for malaise/fatigue.  Respiratory:  Negative for cough and shortness of breath.   Cardiovascular:  Negative for chest pain, palpitations and leg swelling.  Gastrointestinal:  Negative for abdominal pain, constipation and heartburn.  Musculoskeletal:  Negative for back pain, joint pain and myalgias.  Skin: Negative.   Neurological:  Negative for dizziness.  Psychiatric/Behavioral:  The patient is not nervous/anxious.     Physical Exam Constitutional:      General: She is not in acute distress.    Appearance: She is well-developed. She is not diaphoretic.  Neck:     Thyroid: No thyromegaly.  Cardiovascular:     Rate and Rhythm: Normal rate and regular rhythm.     Pulses: Normal pulses.     Heart sounds: Normal heart sounds.  Pulmonary:     Effort: Pulmonary effort is normal. No respiratory distress.     Breath sounds: Normal breath sounds.  Abdominal:     General: Bowel sounds are normal. There is no distension.  Palpations: Abdomen is soft.     Tenderness: There is no abdominal tenderness.  Musculoskeletal:     Cervical back: Neck supple.     Right lower leg: No edema.     Left lower leg: No edema.     Comments:  Left hemiplegia      Lymphadenopathy:     Cervical: No cervical adenopathy.  Skin:    General: Skin is warm and dry.  Neurological:      Mental Status: She is alert. Mental status is at baseline.  Psychiatric:        Mood and Affect: Mood normal.      ASSESSMENT/ PLAN:  TODAY  GERD without esophagitis: will continue pepcid 20 mg daily   2. Chronic constipation: will continue miralax daily   3. Thoracic aortic atherosclerosis: (cxr 12-15-14) is on statin  4. History of stroke/ left hemiplegia: will continue asa 81 mg daily   PREVIOUS   5. Somnolence post stroke: is stable will continue amantadine 50 mg twice daily will monitor   6. HOCM (hypertrophic obstructive cardiomyopathy) is stable will monitor   7. Dysphagia: without signs of aspiration; is on thin liquids.   8. Stage 3a chronic kidney disease: is stable bun 25; creat 0.81 GFR>60  9. Vascular dementia without behavioral disturbance: weight is 136 pounds; will continue aricept 5 mg daily and namenda xr 21 mg daily   10. Hypoalbuminemia due to protein calorie malnutrition albumin 3.2 will continue supplements as directed  11. Vitamin B 12 deficiency: 2092 is off supplement   12. Postoperative hypothyroidism: tsh 2.457 will continue synthroid 75 mcg daily   13. Vitamin D deficiency level is 24.4 continue 1000 units daily   14. Mixed hyperlipidemia: stable LDL 127; trig 196; will continue lipitor 40 mg daily   15. Psychosis in elderly without behavioral disturbance is off seroquel  16. Depression recurrent: will continue zoloft 75 mg daily       Ok Edwards NP [facility]  call [phone]

## 2022-05-10 DIAGNOSIS — Z8673 Personal history of transient ischemic attack (TIA), and cerebral infarction without residual deficits: Secondary | ICD-10-CM | POA: Insufficient documentation

## 2022-06-04 ENCOUNTER — Encounter: Payer: Self-pay | Admitting: Adult Health

## 2022-06-04 ENCOUNTER — Non-Acute Institutional Stay (SKILLED_NURSING_FACILITY): Payer: Medicare Other | Admitting: Adult Health

## 2022-06-04 DIAGNOSIS — I421 Obstructive hypertrophic cardiomyopathy: Secondary | ICD-10-CM | POA: Diagnosis not present

## 2022-06-04 DIAGNOSIS — I69391 Dysphagia following cerebral infarction: Secondary | ICD-10-CM | POA: Diagnosis not present

## 2022-06-04 DIAGNOSIS — N1831 Chronic kidney disease, stage 3a: Secondary | ICD-10-CM

## 2022-06-04 DIAGNOSIS — R400 Somnolence: Secondary | ICD-10-CM | POA: Diagnosis not present

## 2022-06-04 NOTE — Progress Notes (Signed)
Location:  [facility] Room Number: NO/138/P Place of Service:  SNF (31)   CODE STATUS: DNR  Allergies  Allergen Reactions   Hydrochlorothiazide     05/29/2021 K+ 2.6 Other reaction(s): Other (See Comments) 05/29/2021 K+ 2.6   Codeine Nausea Only    Chief Complaint  Patient presents with   Medical Management of Chronic Issues                             Somnolence post stroke: HOCM (hypertrophic obstructive cardiomyopathy)  Dysphagia:  Stage 3a chronic kidney disease:    HPI:  Vickie Mckee is a 88 year old long term resident of this facility being seen for the management of her chronic illnesses:   Somnolence post stroke: HOCM (hypertrophic obstructive cardiomyopathy)  Dysphagia:  Stage 3a chronic kidney disease. There are no reports of uncontrolled pain. Vickie Mckee does get out of bed daily. There are no reports of changes in appetite.   Past Medical History:  Diagnosis Date   Decreased sense of smell    and taste-negative CT scan-2011   Depression    Essential hypertension    GERD (gastroesophageal reflux disease)    Heart murmur    heard first time 4/12   History of mammogram    2010 and Vickie Mckee does not want to repeat them anymore   HOCM (hypertrophic obstructive cardiomyopathy) ([facility])    a. Dx by echo 05/2014.   Hypothyroidism    IBS (irritable bowel syndrome)    Impaired fasting glucose    Low back pain    Lumbar radiculopathy    with negative MRI (1991)   Postmenopausal    with osteopenia, bisphosphonates 1/05-1/11, improved osteopenia 4/12-repeat planned late 2015   Shingles    2015   Thyroid nodule    Vitamin D deficiency    repleted on weekly vitamin D 50000 iu    Past Surgical History:  Procedure Laterality Date   ABDOMINAL HYSTERECTOMY     BREAST BIOPSY     x2   CATARACT EXTRACTION     removal with IOL bilateral   GLAUCOMA SURGERY     laser   skin cancer removal     THYROIDECTOMY     subtotal    Social History   Socioeconomic History    Marital status: Married    Spouse name: Not on file   Number of children: Not on file   Years of education: Not on file   Highest education level: Not on file  Occupational History   Not on file  Tobacco Use   Smoking status: Never   Smokeless tobacco: Never  Vaping Use   Vaping Use: Never used  Substance and Sexual Activity   Alcohol use: No    Alcohol/week: 0.0 standard drinks of alcohol   Drug use: Never   Sexual activity: Not on file  Other Topics Concern   Not on file  Social History Narrative   Not on file   Social Determinants of Health   Financial Resource Strain: Not on file  Food Insecurity: Not on file  Transportation Needs: Not on file  Physical Activity: Not on file  Stress: Not on file  Social Connections: Not on file  Intimate Partner Violence: Not on file   Family History  Problem Relation Age of Onset   Breast cancer Mother    Hypertension Father    CVA Father    Heart failure Sister  CAD Sister    Kidney disease Sister    COPD Brother    Lung cancer Brother    CAD Brother    Diabetes Brother    Stroke Father    Heart attack Neg Hx       VITAL SIGNS BP 136/70   Pulse 72   Temp (!) 97.1 F (36.2 C)   Resp 20   Ht '5\' 2"'$  (1.575 m)   Wt 132 lb (59.9 kg)   SpO2 97%   BMI 24.14 kg/m   Outpatient Encounter Medications as of 06/04/2022  Medication Sig   acetaminophen (TYLENOL) 325 MG tablet Take 2 tablets (650 mg total) by mouth every 4 (four) hours as needed for mild pain (or temp > 37.5 C (99.5 F)).   amantadine (SYMMETREL) 50 MG/5ML solution Take 50 mg by mouth 2 (two) times daily. With breakfast and lunch   Amino Acids-Protein Hydrolys (FEEDING SUPPLEMENT, PRO-STAT SUGAR FREE 64,) LIQD Take 30 mLs by mouth 3 (three) times daily with meals. for albumin 2.8  15-100 gram-kcal/30 mL; amt: 60 cc; oral Special Instructions: for albumin of 2.8 with meal Once A Day   aspirin EC 81 MG EC tablet Take 1 tablet (81 mg total) by mouth daily with  breakfast. Swallow whole.   Cholecalciferol (VITAMIN D-3) 25 MCG (1000 UT) CAPS Take 1 capsule by mouth daily.   donepezil (ARICEPT) 5 MG tablet Take 1 tablet (5 mg total) by mouth at bedtime.   famotidine (PEPCID) 20 MG tablet Take 1 tablet (20 mg total) by mouth daily.   levothyroxine (SYNTHROID, LEVOTHROID) 75 MCG tablet Take 75 mcg by mouth daily before breakfast.    memantine (NAMENDA XR) 28 MG CP24 24 hr capsule Take 28 mg by mouth daily.   Menthol, Topical Analgesic, (BIOFREEZE) 4 % GEL Apply topically as needed. Left shoulder pain   NON FORMULARY Diet:  Regular, thin liquids- Add prune juice to Breakfast tray   oxymetazoline (AFRIN) 0.05 % nasal spray Place 1 spray into both nostrils as needed. epistaxis   polyethylene glycol (MIRALAX / GLYCOLAX) 17 g packet Take 17 g by mouth daily.   sertraline (ZOLOFT) 25 MG tablet Take 25 mg by mouth daily.  to be given with '50mg'$  to = '75mg'$    sertraline (ZOLOFT) 50 MG tablet Take 50 mg by mouth daily. to be given with '25mg'$  to = '75mg'$    sodium chloride (OCEAN) 0.65 % SOLN nasal spray Place 1 spray into both nostrils 3 (three) times daily. And as needed   vitamin C (ASCORBIC ACID) 500 MG tablet Take 500 mg by mouth daily.   No facility-administered encounter medications on file as of 06/04/2022.     SIGNIFICANT DIAGNOSTIC EXAMS   PREVIOUS   05-29-21: ct of head code stroke:  No evidence of acute intracranial abnormality. ASPECTS is 10. Mild generalized cerebral atrophy. Paranasal sinus disease at the imaged levels, as described.  05-29-21: ct angio of head and neck:  CTA neck: 1. The common carotid and internal carotid arteries are patent within the neck without stenosis. Mild atherosclerotic plaque within both carotid systems within the neck, as described. 2. Vertebral arteries patent within the neck. Mild-to-moderate stenosis within the distal V2 segment of the non-dominant left vertebral artery. 3. Mild nonspecific fusiform dilation of the  distal cervical left ICA to 7 mm. CTA head: 1. No intracranial large vessel occlusion or proximal high-grade arterial stenosis identified. 2. Intracranial atherosclerotic disease, as described. 3. 2 mm aneurysm arising from the distal  M1 segment of the right middle cerebral artery. At least one branch vessel arises from this aneurysm. 4. 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  05-29-21: MRI of brain:  1. Acute right pontine infarct. Mild associated edema without mass effect. 2. Multiple small remote bilateral cerebellar lacunar infarcts. 3. Mild for age chronic microvascular ischemic disease and moderate atrophy.  05-29-21: 2-d echo:  1. No SAM noted mid/apical cavity obliteration in systole with some flow  acceleration peak velocity in 77msec range . Left ventricular ejection  fraction, by estimation, is 60 to 65%. The left ventricle has normal  function. The left ventricle has no  regional wall motion abnormalities. There is severe left ventricular  hypertrophy. Left ventricular diastolic parameters are consistent with  Grade I diastolic dysfunction (impaired relaxation).   05-30-21: EEG This study is suggestive of mild diffuse encephalopathy, nonspecific etiology. No seizures or epileptiform discharges were seen throughout the recording.   NO NEW EXAMS  LABS REVIEWED PREVIOUS   05-29-21: wbc 21.3; hgb 15.6; hct 45.5; mcv 87.2 plt 317; glucose 137; bun 18; creat 0.90; k+ 2.6; na++ 136; ca 8.9; GFR>60; liver normal albumin 4.2; mag 1.8; tsh 2.093; vit B 12: 584 vit D 36.20 05-30-21: hgb a1c 5.7; chol 203; ldl 127; trig 196 hdl 37 06-16-21: wbc 10.7; hb 13.4; hct 40.3; mcv 90.6 plt 250; glucose 105; bun 33; creat 1.03; k+ 4.2; na++ 137; ca 9.1; GFR 53 06-26-21: wbc 7.5; hgb 12.7; hct 38.2; mcv 90.7 plt 22; glucose 112; bun 29; creat 1.07; k+ 3.9; na++ 139; ca 9,4; GFR 50  06-27-21: urine culture: 20,000 proteus mirabilis  07-12-21: wbc 14.9; hgb 14.4; hct 42.8; mcv 93.0 plt 271;  glucose 128; bun 27; creat 0.95; k+ 3.2; na++ 139; ca 9.1; GFR 58; liver normal albumin 3.6 07-28-21: wbc 9.5; hgb 12.8; hct 38.4; mcv 993.7 plt 237; glucose 102; bun 25; creat 0.81; k+ 3.3; na++ 139; ca 8.2 GFR>60; liver normal albumin 3.2; tsh 2.457 vit B 12: 261; folate 9. 09-07-21 k+ 3.5 02-12-22: wbc 7.8; hgb 14.8; hct 44.7; mcv 89.2 plt 221; glucose 180; bun 24; creat 0.89; k+ 4.0; na++ 141; ca 9.0; GFR 56; protein 6.6; albumin 3.6; hgb a1c 5.5; tsh 2.305 free t4: 0.94; vitamin B12: 2092; vitamin B6: 72.2 vitamin D 24.4    TODAY  04-26-22: wbc 3.8; hgb 10.8; hct 32; mcv 89.2 plt 204; glucose 96; bun 24; creat 0.8; k+ 4.0; na++ 140; ca 9.0; gfr>60; albumin 3.4; hgb a1c 6.1    Review of Systems  Constitutional:  Negative for malaise/fatigue.  Respiratory:  Negative for cough and shortness of breath.   Cardiovascular:  Negative for chest pain, palpitations and leg swelling.  Gastrointestinal:  Negative for abdominal pain, constipation and heartburn.  Musculoskeletal:  Negative for back pain, joint pain and myalgias.  Skin: Negative.   Neurological:  Negative for dizziness.  Psychiatric/Behavioral:  The patient is not nervous/anxious.      Physical Exam Constitutional:      General: Vickie Mckee is not in acute distress.    Appearance: Vickie Mckee is not diaphoretic.  Eyes:     Conjunctiva/sclera: Conjunctivae normal.  Neck:     Thyroid: No thyromegaly.     Vascular: No JVD.  Cardiovascular:     Rate and Rhythm: Normal rate and regular rhythm.     Pulses: Normal pulses.  Pulmonary:     Effort: Pulmonary effort is normal. No respiratory distress.     Breath sounds: Normal breath sounds.  No wheezing.  Abdominal:     General: Bowel sounds are normal. There is no distension.     Palpations: Abdomen is soft.     Tenderness: There is no abdominal tenderness.  Musculoskeletal:     Cervical back: Neck supple.     Right lower leg: No edema.     Left lower leg: No edema.     Comments: Left hemiplegia      Lymphadenopathy:     Cervical: No cervical adenopathy.  Skin:    General: Skin is warm and dry.  Neurological:     Mental Status: Vickie Mckee is alert. Mental status is at baseline.  Psychiatric:        Mood and Affect: Mood normal.     ASSESSMENT/ PLAN:  TODAY  Somnolence post stroke: will continue amantadine 50 mg twice daily   2. HOCM (hypertrophic obstructive cardiomyopathy) stable will monitor   3. Dysphagia: no signs of aspiration of present on thin liquids  4. Stage 3a chronic kidney disease: bun 24; creat 0.8 gfr >60   PREVIOUS   5. Vascular dementia without behavioral disturbance: weight is 132 pounds; will continue aricept 5 mg daily and namenda xr 28 mg daily   6. Hypoalbuminemia due to protein calorie malnutrition albumin 3.4 will continue supplements as directed  7. Vitamin B 12 deficiency: 2092 is off supplement   8. Postoperative hypothyroidism: tsh 2.457 will continue synthroid 75 mcg daily   9. Vitamin D deficiency level is 24.4 continue 1000 units daily   10. Mixed hyperlipidemia: stable LDL 127; trig 196; is off statin due to her advanced age   35. Psychosis in elderly without behavioral disturbance is off seroquel  12. Depression recurrent: will continue zoloft 75 mg daily   13. GERD without esophagitis: will continue pepcid 20 mg daily   14. Chronic constipation: will continue miralax every other  daily   15. Thoracic aortic atherosclerosis: (cxr 12-15-14) is on statin  16. History of stroke/ left hemiplegia: will continue asa 81 mg daily        Ok Edwards NP [facility]  call [phone]

## 2022-06-07 NOTE — Progress Notes (Signed)
Order(s) created erroneously. Erroneous order ID: [id number]  Order canceled by: CHART CORRECTION ANALYST TWELVE, IDENTITY  Order cancel date/time: 06/07/2022 8:58 AM

## 2022-06-07 NOTE — Progress Notes (Signed)
Order(s) created erroneously. Erroneous order ID: [id number]  Order canceled by: CHART CORRECTION ANALYST TWELVE, IDENTITY  Order cancel date/time: 06/07/2022 8:56 AM

## 2022-06-07 NOTE — Progress Notes (Signed)
Order(s) created erroneously. Erroneous order ID: [id number]  Order canceled by: CHART CORRECTION ANALYST TWELVE, IDENTITY  Order cancel date/time: 06/07/2022 8:58 AM

## 2022-06-07 NOTE — Progress Notes (Signed)
Order(s) created erroneously. Erroneous order ID: [id number]  Order canceled by: CHART CORRECTION ANALYST TWELVE, IDENTITY  Order cancel date/time: 06/07/2022 8:57 AM

## 2022-06-07 NOTE — Progress Notes (Signed)
Order(s) created erroneously. Erroneous order ID: [id number]  Order canceled by: CHART CORRECTION ANALYST TWELVE, IDENTITY  Order cancel date/time: 06/07/2022 8:57 AM

## 2022-06-07 NOTE — Progress Notes (Signed)
Order(s) created erroneously. Erroneous order ID: [id number]  Order canceled by: CHART CORRECTION ANALYST TWELVE, IDENTITY  Order cancel date/time: 06/07/2022 8:57 AM

## 2022-06-20 ENCOUNTER — Non-Acute Institutional Stay (SKILLED_NURSING_FACILITY): Payer: Medicare Other | Admitting: Internal Medicine

## 2022-06-20 ENCOUNTER — Encounter: Payer: Self-pay | Admitting: Internal Medicine

## 2022-06-20 DIAGNOSIS — F0150 Vascular dementia without behavioral disturbance: Secondary | ICD-10-CM | POA: Diagnosis not present

## 2022-06-20 DIAGNOSIS — I700 Atherosclerosis of aorta: Secondary | ICD-10-CM | POA: Diagnosis not present

## 2022-06-20 DIAGNOSIS — J849 Interstitial pulmonary disease, unspecified: Secondary | ICD-10-CM | POA: Diagnosis not present

## 2022-06-20 DIAGNOSIS — E890 Postprocedural hypothyroidism: Secondary | ICD-10-CM | POA: Diagnosis not present
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
## 2022-06-20 NOTE — Patient Instructions (Signed)
See assessment and plan under each diagnosis in the problem list and acutely for this visit 

## 2022-06-20 NOTE — Assessment & Plan Note (Signed)
No palpable abnormalities on thyroid exam.  Current TSH is therapeutic at 2.305 on 75 mcg of L-thyroxine daily.  No change indicated; periodic monitor every 6 to 12 months.
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
## 2022-06-20 NOTE — Assessment & Plan Note (Signed)
She denies angina or anginal equivalents.  Blood pressure is well controlled without antihypertensives.  No change indicated.

## 2022-06-20 NOTE — Assessment & Plan Note (Signed)
Sticky rales persist, greater in the right lower lobe than the left.  She has no pulmonary symptoms subjectively but her dementia invalidates responses.  O2 sats are good on room air.

## 2022-06-29 DIAGNOSIS — F0390 Unspecified dementia without behavioral disturbance: Secondary | ICD-10-CM | POA: Diagnosis not present

## 2022-06-29 DIAGNOSIS — I63211 Cerebral infarction due to unspecified occlusion or stenosis of right vertebral arteries: Secondary | ICD-10-CM | POA: Diagnosis not present

## 2022-06-29 DIAGNOSIS — I69354 Hemiplegia and hemiparesis following cerebral infarction affecting left non-dominant side: Secondary | ICD-10-CM | POA: Diagnosis not present

## 2022-06-29 DIAGNOSIS — M6281 Muscle weakness (generalized): Secondary | ICD-10-CM | POA: Diagnosis not present

## 2022-06-29 DIAGNOSIS — R279 Unspecified lack of coordination: Secondary | ICD-10-CM | POA: Diagnosis not present

## 2022-06-29 DIAGNOSIS — Z741 Need for assistance with personal care: Secondary | ICD-10-CM | POA: Diagnosis not present

## 2022-07-02 DIAGNOSIS — Z741 Need for assistance with personal care: Secondary | ICD-10-CM | POA: Diagnosis not present

## 2022-07-02 DIAGNOSIS — F0390 Unspecified dementia without behavioral disturbance: Secondary | ICD-10-CM | POA: Diagnosis not present

## 2022-07-02 DIAGNOSIS — M6281 Muscle weakness (generalized): Secondary | ICD-10-CM | POA: Diagnosis not present

## 2022-07-02 DIAGNOSIS — I63211 Cerebral infarction due to unspecified occlusion or stenosis of right vertebral arteries: Secondary | ICD-10-CM | POA: Diagnosis not present

## 2022-07-02 DIAGNOSIS — R279 Unspecified lack of coordination: Secondary | ICD-10-CM | POA: Diagnosis not present

## 2022-07-02 DIAGNOSIS — I69354 Hemiplegia and hemiparesis following cerebral infarction affecting left non-dominant side: Secondary | ICD-10-CM | POA: Diagnosis not present

## 2022-07-03 DIAGNOSIS — I69354 Hemiplegia and hemiparesis following cerebral infarction affecting left non-dominant side: Secondary | ICD-10-CM | POA: Diagnosis not present

## 2022-07-03 DIAGNOSIS — F0390 Unspecified dementia without behavioral disturbance: Secondary | ICD-10-CM | POA: Diagnosis not present

## 2022-07-03 DIAGNOSIS — M6281 Muscle weakness (generalized): Secondary | ICD-10-CM | POA: Diagnosis not present

## 2022-07-03 DIAGNOSIS — Z741 Need for assistance with personal care: Secondary | ICD-10-CM | POA: Diagnosis not present

## 2022-07-03 DIAGNOSIS — I63211 Cerebral infarction due to unspecified occlusion or stenosis of right vertebral arteries: Secondary | ICD-10-CM | POA: Diagnosis not present

## 2022-07-03 DIAGNOSIS — R279 Unspecified lack of coordination: Secondary | ICD-10-CM | POA: Diagnosis not present

## 2022-07-04 DIAGNOSIS — M6281 Muscle weakness (generalized): Secondary | ICD-10-CM | POA: Diagnosis not present

## 2022-07-04 DIAGNOSIS — I69354 Hemiplegia and hemiparesis following cerebral infarction affecting left non-dominant side: Secondary | ICD-10-CM | POA: Diagnosis not present

## 2022-07-04 DIAGNOSIS — F0390 Unspecified dementia without behavioral disturbance: Secondary | ICD-10-CM | POA: Diagnosis not present

## 2022-07-04 DIAGNOSIS — I63211 Cerebral infarction due to unspecified occlusion or stenosis of right vertebral arteries: Secondary | ICD-10-CM | POA: Diagnosis not present

## 2022-07-04 DIAGNOSIS — R279 Unspecified lack of coordination: Secondary | ICD-10-CM | POA: Diagnosis not present

## 2022-07-04 DIAGNOSIS — Z741 Need for assistance with personal care: Secondary | ICD-10-CM | POA: Diagnosis not present

## 2022-07-05 DIAGNOSIS — R279 Unspecified lack of coordination: Secondary | ICD-10-CM | POA: Diagnosis not present

## 2022-07-05 DIAGNOSIS — M6281 Muscle weakness (generalized): Secondary | ICD-10-CM | POA: Diagnosis not present

## 2022-07-05 DIAGNOSIS — I63211 Cerebral infarction due to unspecified occlusion or stenosis of right vertebral arteries: Secondary | ICD-10-CM | POA: Diagnosis not present

## 2022-07-05 DIAGNOSIS — F0390 Unspecified dementia without behavioral disturbance: Secondary | ICD-10-CM | POA: Diagnosis not present

## 2022-07-05 DIAGNOSIS — Z741 Need for assistance with personal care: Secondary | ICD-10-CM | POA: Diagnosis not present

## 2022-07-05 DIAGNOSIS — I69354 Hemiplegia and hemiparesis following cerebral infarction affecting left non-dominant side: Secondary | ICD-10-CM | POA: Diagnosis not present

## 2022-07-06 DIAGNOSIS — I69354 Hemiplegia and hemiparesis following cerebral infarction affecting left non-dominant side: Secondary | ICD-10-CM | POA: Diagnosis not present

## 2022-07-06 DIAGNOSIS — I63211 Cerebral infarction due to unspecified occlusion or stenosis of right vertebral arteries: Secondary | ICD-10-CM | POA: Diagnosis not present

## 2022-07-06 DIAGNOSIS — R279 Unspecified lack of coordination: Secondary | ICD-10-CM | POA: Diagnosis not present

## 2022-07-06 DIAGNOSIS — M6281 Muscle weakness (generalized): Secondary | ICD-10-CM | POA: Diagnosis not present

## 2022-07-06 DIAGNOSIS — F0390 Unspecified dementia without behavioral disturbance: Secondary | ICD-10-CM | POA: Diagnosis not present

## 2022-07-06 DIAGNOSIS — Z741 Need for assistance with personal care: Secondary | ICD-10-CM | POA: Diagnosis not present

## 2022-07-09 DIAGNOSIS — I69354 Hemiplegia and hemiparesis following cerebral infarction affecting left non-dominant side: Secondary | ICD-10-CM | POA: Diagnosis not present

## 2022-07-09 DIAGNOSIS — I63211 Cerebral infarction due to unspecified occlusion or stenosis of right vertebral arteries: Secondary | ICD-10-CM | POA: Diagnosis not present

## 2022-07-09 DIAGNOSIS — R279 Unspecified lack of coordination: Secondary | ICD-10-CM | POA: Diagnosis not present

## 2022-07-09 DIAGNOSIS — F0390 Unspecified dementia without behavioral disturbance: Secondary | ICD-10-CM | POA: Diagnosis not present

## 2022-07-09 DIAGNOSIS — Z741 Need for assistance with personal care: Secondary | ICD-10-CM | POA: Diagnosis not present

## 2022-07-09 DIAGNOSIS — M6281 Muscle weakness (generalized): Secondary | ICD-10-CM | POA: Diagnosis not present

## 2022-07-10 DIAGNOSIS — R279 Unspecified lack of coordination: Secondary | ICD-10-CM | POA: Diagnosis not present

## 2022-07-10 DIAGNOSIS — M6281 Muscle weakness (generalized): Secondary | ICD-10-CM | POA: Diagnosis not present

## 2022-07-10 DIAGNOSIS — Z741 Need for assistance with personal care: Secondary | ICD-10-CM | POA: Diagnosis not present

## 2022-07-10 DIAGNOSIS — F0390 Unspecified dementia without behavioral disturbance: Secondary | ICD-10-CM | POA: Diagnosis not present

## 2022-07-10 DIAGNOSIS — I63211 Cerebral infarction due to unspecified occlusion or stenosis of right vertebral arteries: Secondary | ICD-10-CM | POA: Diagnosis not present

## 2022-07-10 DIAGNOSIS — I69354 Hemiplegia and hemiparesis following cerebral infarction affecting left non-dominant side: Secondary | ICD-10-CM | POA: Diagnosis not present

## 2022-07-11 DIAGNOSIS — M6281 Muscle weakness (generalized): Secondary | ICD-10-CM | POA: Diagnosis not present

## 2022-07-11 DIAGNOSIS — I69354 Hemiplegia and hemiparesis following cerebral infarction affecting left non-dominant side: Secondary | ICD-10-CM | POA: Diagnosis not present

## 2022-07-11 DIAGNOSIS — Z741 Need for assistance with personal care: Secondary | ICD-10-CM | POA: Diagnosis not present

## 2022-07-11 DIAGNOSIS — F0390 Unspecified dementia without behavioral disturbance: Secondary | ICD-10-CM | POA: Diagnosis not present

## 2022-07-11 DIAGNOSIS — R279 Unspecified lack of coordination: Secondary | ICD-10-CM | POA: Diagnosis not present

## 2022-07-11 DIAGNOSIS — I63211 Cerebral infarction due to unspecified occlusion or stenosis of right vertebral arteries: Secondary | ICD-10-CM | POA: Diagnosis not present

## 2022-07-12 DIAGNOSIS — F0390 Unspecified dementia without behavioral disturbance: Secondary | ICD-10-CM | POA: Diagnosis not present

## 2022-07-12 DIAGNOSIS — M6281 Muscle weakness (generalized): Secondary | ICD-10-CM | POA: Diagnosis not present

## 2022-07-12 DIAGNOSIS — I63211 Cerebral infarction due to unspecified occlusion or stenosis of right vertebral arteries: Secondary | ICD-10-CM | POA: Diagnosis not present

## 2022-07-12 DIAGNOSIS — R279 Unspecified lack of coordination: Secondary | ICD-10-CM | POA: Diagnosis not present

## 2022-07-12 DIAGNOSIS — Z741 Need for assistance with personal care: Secondary | ICD-10-CM | POA: Diagnosis not present

## 2022-07-12 DIAGNOSIS — I69354 Hemiplegia and hemiparesis following cerebral infarction affecting left non-dominant side: Secondary | ICD-10-CM | POA: Diagnosis not present

## 2022-07-13 DIAGNOSIS — F0390 Unspecified dementia without behavioral disturbance: Secondary | ICD-10-CM | POA: Diagnosis not present

## 2022-07-13 DIAGNOSIS — R279 Unspecified lack of coordination: Secondary | ICD-10-CM | POA: Diagnosis not present

## 2022-07-13 DIAGNOSIS — M6281 Muscle weakness (generalized): Secondary | ICD-10-CM | POA: Diagnosis not present

## 2022-07-13 DIAGNOSIS — Z741 Need for assistance with personal care: Secondary | ICD-10-CM | POA: Diagnosis not present

## 2022-07-13 DIAGNOSIS — I63211 Cerebral infarction due to unspecified occlusion or stenosis of right vertebral arteries: Secondary | ICD-10-CM | POA: Diagnosis not present

## 2022-07-13 DIAGNOSIS — I69354 Hemiplegia and hemiparesis following cerebral infarction affecting left non-dominant side: Secondary | ICD-10-CM | POA: Diagnosis not present

## 2022-07-16 DIAGNOSIS — R279 Unspecified lack of coordination: Secondary | ICD-10-CM | POA: Diagnosis not present

## 2022-07-16 DIAGNOSIS — I69354 Hemiplegia and hemiparesis following cerebral infarction affecting left non-dominant side: Secondary | ICD-10-CM | POA: Diagnosis not present

## 2022-07-16 DIAGNOSIS — Z741 Need for assistance with personal care: Secondary | ICD-10-CM | POA: Diagnosis not present

## 2022-07-16 DIAGNOSIS — F0390 Unspecified dementia without behavioral disturbance: Secondary | ICD-10-CM | POA: Diagnosis not present

## 2022-07-16 DIAGNOSIS — M6281 Muscle weakness (generalized): Secondary | ICD-10-CM | POA: Diagnosis not present

## 2022-07-16 DIAGNOSIS — I63211 Cerebral infarction due to unspecified occlusion or stenosis of right vertebral arteries: Secondary | ICD-10-CM | POA: Diagnosis not present

## 2022-07-17 DIAGNOSIS — M6281 Muscle weakness (generalized): Secondary | ICD-10-CM | POA: Diagnosis not present

## 2022-07-17 DIAGNOSIS — Z741 Need for assistance with personal care: Secondary | ICD-10-CM | POA: Diagnosis not present

## 2022-07-17 DIAGNOSIS — F0390 Unspecified dementia without behavioral disturbance: Secondary | ICD-10-CM | POA: Diagnosis not present

## 2022-07-17 DIAGNOSIS — R279 Unspecified lack of coordination: Secondary | ICD-10-CM | POA: Diagnosis not present

## 2022-07-17 DIAGNOSIS — I63211 Cerebral infarction due to unspecified occlusion or stenosis of right vertebral arteries: Secondary | ICD-10-CM | POA: Diagnosis not present

## 2022-07-17 DIAGNOSIS — I69354 Hemiplegia and hemiparesis following cerebral infarction affecting left non-dominant side: Secondary | ICD-10-CM | POA: Diagnosis not present

## 2022-07-18 DIAGNOSIS — R279 Unspecified lack of coordination: Secondary | ICD-10-CM | POA: Diagnosis not present

## 2022-07-18 DIAGNOSIS — I63211 Cerebral infarction due to unspecified occlusion or stenosis of right vertebral arteries: Secondary | ICD-10-CM | POA: Diagnosis not present

## 2022-07-18 DIAGNOSIS — M6281 Muscle weakness (generalized): Secondary | ICD-10-CM | POA: Diagnosis not present

## 2022-07-18 DIAGNOSIS — F0390 Unspecified dementia without behavioral disturbance: Secondary | ICD-10-CM | POA: Diagnosis not present

## 2022-07-18 DIAGNOSIS — I69354 Hemiplegia and hemiparesis following cerebral infarction affecting left non-dominant side: Secondary | ICD-10-CM | POA: Diagnosis not present

## 2022-07-18 DIAGNOSIS — Z741 Need for assistance with personal care: Secondary | ICD-10-CM | POA: Diagnosis not present

## 2022-07-19 DIAGNOSIS — M6281 Muscle weakness (generalized): Secondary | ICD-10-CM | POA: Diagnosis not present

## 2022-07-19 DIAGNOSIS — F0390 Unspecified dementia without behavioral disturbance: Secondary | ICD-10-CM | POA: Diagnosis not present

## 2022-07-19 DIAGNOSIS — I69354 Hemiplegia and hemiparesis following cerebral infarction affecting left non-dominant side: Secondary | ICD-10-CM | POA: Diagnosis not present

## 2022-07-19 DIAGNOSIS — R279 Unspecified lack of coordination: Secondary | ICD-10-CM | POA: Diagnosis not present

## 2022-07-19 DIAGNOSIS — Z741 Need for assistance with personal care: Secondary | ICD-10-CM | POA: Diagnosis not present

## 2022-07-19 DIAGNOSIS — I63211 Cerebral infarction due to unspecified occlusion or stenosis of right vertebral arteries: Secondary | ICD-10-CM | POA: Diagnosis not present

## 2022-07-20 DIAGNOSIS — I63211 Cerebral infarction due to unspecified occlusion or stenosis of right vertebral arteries: Secondary | ICD-10-CM | POA: Diagnosis not present

## 2022-07-20 DIAGNOSIS — R279 Unspecified lack of coordination: Secondary | ICD-10-CM | POA: Diagnosis not present

## 2022-07-20 DIAGNOSIS — I69354 Hemiplegia and hemiparesis following cerebral infarction affecting left non-dominant side: Secondary | ICD-10-CM | POA: Diagnosis not present

## 2022-07-20 DIAGNOSIS — M6281 Muscle weakness (generalized): Secondary | ICD-10-CM | POA: Diagnosis not present

## 2022-07-20 DIAGNOSIS — F0390 Unspecified dementia without behavioral disturbance: Secondary | ICD-10-CM | POA: Diagnosis not present

## 2022-07-20 DIAGNOSIS — Z741 Need for assistance with personal care: Secondary | ICD-10-CM | POA: Diagnosis not present

## 2022-07-23 DIAGNOSIS — F0390 Unspecified dementia without behavioral disturbance: Secondary | ICD-10-CM | POA: Diagnosis not present

## 2022-07-23 DIAGNOSIS — M6281 Muscle weakness (generalized): Secondary | ICD-10-CM | POA: Diagnosis not present

## 2022-07-23 DIAGNOSIS — I63211 Cerebral infarction due to unspecified occlusion or stenosis of right vertebral arteries: Secondary | ICD-10-CM | POA: Diagnosis not present

## 2022-07-23 DIAGNOSIS — I69354 Hemiplegia and hemiparesis following cerebral infarction affecting left non-dominant side: Secondary | ICD-10-CM | POA: Diagnosis not present

## 2022-07-23 DIAGNOSIS — Z741 Need for assistance with personal care: Secondary | ICD-10-CM | POA: Diagnosis not present

## 2022-07-23 DIAGNOSIS — R279 Unspecified lack of coordination: Secondary | ICD-10-CM | POA: Diagnosis not present

## 2022-07-24 DIAGNOSIS — M6281 Muscle weakness (generalized): Secondary | ICD-10-CM | POA: Diagnosis not present

## 2022-07-24 DIAGNOSIS — Z741 Need for assistance with personal care: Secondary | ICD-10-CM | POA: Diagnosis not present

## 2022-07-24 DIAGNOSIS — F0390 Unspecified dementia without behavioral disturbance: Secondary | ICD-10-CM | POA: Diagnosis not present

## 2022-07-24 DIAGNOSIS — I63211 Cerebral infarction due to unspecified occlusion or stenosis of right vertebral arteries: Secondary | ICD-10-CM | POA: Diagnosis not present

## 2022-07-24 DIAGNOSIS — I69354 Hemiplegia and hemiparesis following cerebral infarction affecting left non-dominant side: Secondary | ICD-10-CM | POA: Diagnosis not present

## 2022-07-24 DIAGNOSIS — R279 Unspecified lack of coordination: Secondary | ICD-10-CM | POA: Diagnosis not present

## 2022-07-25 DIAGNOSIS — Z23 Encounter for immunization: Secondary | ICD-10-CM | POA: Diagnosis not present

## 2022-07-26 DIAGNOSIS — M6281 Muscle weakness (generalized): Secondary | ICD-10-CM | POA: Diagnosis not present

## 2022-07-26 DIAGNOSIS — Z741 Need for assistance with personal care: Secondary | ICD-10-CM | POA: Diagnosis not present

## 2022-07-26 DIAGNOSIS — I63211 Cerebral infarction due to unspecified occlusion or stenosis of right vertebral arteries: Secondary | ICD-10-CM | POA: Diagnosis not present

## 2022-07-26 DIAGNOSIS — I69354 Hemiplegia and hemiparesis following cerebral infarction affecting left non-dominant side: Secondary | ICD-10-CM | POA: Diagnosis not present

## 2022-07-26 DIAGNOSIS — F0390 Unspecified dementia without behavioral disturbance: Secondary | ICD-10-CM | POA: Diagnosis not present

## 2022-07-26 DIAGNOSIS — R279 Unspecified lack of coordination: Secondary | ICD-10-CM | POA: Diagnosis not present

## 2022-07-27 DIAGNOSIS — M6281 Muscle weakness (generalized): Secondary | ICD-10-CM | POA: Diagnosis not present

## 2022-07-27 DIAGNOSIS — F0390 Unspecified dementia without behavioral disturbance: Secondary | ICD-10-CM | POA: Diagnosis not present

## 2022-07-27 DIAGNOSIS — I63211 Cerebral infarction due to unspecified occlusion or stenosis of right vertebral arteries: Secondary | ICD-10-CM | POA: Diagnosis not present

## 2022-07-27 DIAGNOSIS — R279 Unspecified lack of coordination: Secondary | ICD-10-CM | POA: Diagnosis not present

## 2022-07-27 DIAGNOSIS — Z741 Need for assistance with personal care: Secondary | ICD-10-CM | POA: Diagnosis not present

## 2022-07-27 DIAGNOSIS — I69354 Hemiplegia and hemiparesis following cerebral infarction affecting left non-dominant side: Secondary | ICD-10-CM | POA: Diagnosis not present

## 2022-07-30 DIAGNOSIS — Z1159 Encounter for screening for other viral diseases: Secondary | ICD-10-CM | POA: Diagnosis not present

## 2022-07-30 DIAGNOSIS — I69354 Hemiplegia and hemiparesis following cerebral infarction affecting left non-dominant side: Secondary | ICD-10-CM | POA: Diagnosis not present

## 2022-08-02 DIAGNOSIS — Z741 Need for assistance with personal care: Secondary | ICD-10-CM | POA: Diagnosis not present

## 2022-08-02 DIAGNOSIS — I69354 Hemiplegia and hemiparesis following cerebral infarction affecting left non-dominant side: Secondary | ICD-10-CM | POA: Diagnosis not present

## 2022-08-02 DIAGNOSIS — I63211 Cerebral infarction due to unspecified occlusion or stenosis of right vertebral arteries: Secondary | ICD-10-CM | POA: Diagnosis not present

## 2022-08-02 DIAGNOSIS — R279 Unspecified lack of coordination: Secondary | ICD-10-CM | POA: Diagnosis not present

## 2022-08-02 DIAGNOSIS — M6281 Muscle weakness (generalized): Secondary | ICD-10-CM | POA: Diagnosis not present

## 2022-08-02 DIAGNOSIS — F0390 Unspecified dementia without behavioral disturbance: Secondary | ICD-10-CM | POA: Diagnosis not present

## 2022-08-03 DIAGNOSIS — F0390 Unspecified dementia without behavioral disturbance: Secondary | ICD-10-CM | POA: Diagnosis not present

## 2022-08-03 DIAGNOSIS — I69354 Hemiplegia and hemiparesis following cerebral infarction affecting left non-dominant side: Secondary | ICD-10-CM | POA: Diagnosis not present

## 2022-08-03 DIAGNOSIS — R279 Unspecified lack of coordination: Secondary | ICD-10-CM | POA: Diagnosis not present

## 2022-08-03 DIAGNOSIS — Z741 Need for assistance with personal care: Secondary | ICD-10-CM | POA: Diagnosis not present

## 2022-08-03 DIAGNOSIS — M6281 Muscle weakness (generalized): Secondary | ICD-10-CM | POA: Diagnosis not present

## 2022-08-03 DIAGNOSIS — I63211 Cerebral infarction due to unspecified occlusion or stenosis of right vertebral arteries: Secondary | ICD-10-CM | POA: Diagnosis not present

## 2022-08-04 DIAGNOSIS — I69354 Hemiplegia and hemiparesis following cerebral infarction affecting left non-dominant side: Secondary | ICD-10-CM | POA: Diagnosis not present

## 2022-08-04 DIAGNOSIS — Z741 Need for assistance with personal care: Secondary | ICD-10-CM | POA: Diagnosis not present

## 2022-08-04 DIAGNOSIS — M6281 Muscle weakness (generalized): Secondary | ICD-10-CM | POA: Diagnosis not present

## 2022-08-04 DIAGNOSIS — I63211 Cerebral infarction due to unspecified occlusion or stenosis of right vertebral arteries: Secondary | ICD-10-CM | POA: Diagnosis not present

## 2022-08-04 DIAGNOSIS — R279 Unspecified lack of coordination: Secondary | ICD-10-CM | POA: Diagnosis not present

## 2022-08-04 DIAGNOSIS — F0390 Unspecified dementia without behavioral disturbance: Secondary | ICD-10-CM | POA: Diagnosis not present

## 2022-08-06 ENCOUNTER — Encounter: Payer: Self-pay | Admitting: Adult Health

## 2022-08-06 ENCOUNTER — Non-Acute Institutional Stay (SKILLED_NURSING_FACILITY): Payer: Medicare Other | Admitting: Adult Health

## 2022-08-06 DIAGNOSIS — E890 Postprocedural hypothyroidism: Secondary | ICD-10-CM

## 2022-08-06 DIAGNOSIS — K5909 Other constipation: Secondary | ICD-10-CM

## 2022-08-06 DIAGNOSIS — I700 Atherosclerosis of aorta: Secondary | ICD-10-CM | POA: Diagnosis not present

## 2022-08-06 DIAGNOSIS — I69391 Dysphagia following cerebral infarction: Secondary | ICD-10-CM | POA: Diagnosis not present

## 2022-08-06 DIAGNOSIS — F0150 Vascular dementia without behavioral disturbance: Secondary | ICD-10-CM | POA: Diagnosis not present

## 2022-08-06 DIAGNOSIS — I10 Essential (primary) hypertension: Secondary | ICD-10-CM | POA: Diagnosis not present

## 2022-08-06 DIAGNOSIS — G8194 Hemiplegia, unspecified affecting left nondominant side: Secondary | ICD-10-CM

## 2022-08-06 DIAGNOSIS — N1831 Chronic kidney disease, stage 3a: Secondary | ICD-10-CM | POA: Diagnosis not present

## 2022-08-06 DIAGNOSIS — K219 Gastro-esophageal reflux disease without esophagitis: Secondary | ICD-10-CM | POA: Diagnosis not present

## 2022-08-06 DIAGNOSIS — F339 Major depressive disorder, recurrent, unspecified: Secondary | ICD-10-CM

## 2022-08-06 DIAGNOSIS — R279 Unspecified lack of coordination: Secondary | ICD-10-CM | POA: Diagnosis not present

## 2022-08-06 DIAGNOSIS — R400 Somnolence: Secondary | ICD-10-CM

## 2022-08-06 DIAGNOSIS — F0390 Unspecified dementia without behavioral disturbance: Secondary | ICD-10-CM | POA: Diagnosis not present

## 2022-08-06 DIAGNOSIS — I421 Obstructive hypertrophic cardiomyopathy: Secondary | ICD-10-CM | POA: Diagnosis not present

## 2022-08-06 DIAGNOSIS — E538 Deficiency of other specified B group vitamins: Secondary | ICD-10-CM

## 2022-08-06 DIAGNOSIS — E782 Mixed hyperlipidemia: Secondary | ICD-10-CM

## 2022-08-06 DIAGNOSIS — I63211 Cerebral infarction due to unspecified occlusion or stenosis of right vertebral arteries: Secondary | ICD-10-CM | POA: Diagnosis not present

## 2022-08-06 DIAGNOSIS — E8809 Other disorders of plasma-protein metabolism, not elsewhere classified: Secondary | ICD-10-CM

## 2022-08-06 DIAGNOSIS — M6281 Muscle weakness (generalized): Secondary | ICD-10-CM | POA: Diagnosis not present

## 2022-08-06 DIAGNOSIS — E46 Unspecified protein-calorie malnutrition: Secondary | ICD-10-CM

## 2022-08-06 DIAGNOSIS — J849 Interstitial pulmonary disease, unspecified: Secondary | ICD-10-CM | POA: Diagnosis not present

## 2022-08-06 DIAGNOSIS — Z8673 Personal history of transient ischemic attack (TIA), and cerebral infarction without residual deficits: Secondary | ICD-10-CM

## 2022-08-06 DIAGNOSIS — E559 Vitamin D deficiency, unspecified: Secondary | ICD-10-CM

## 2022-08-06 DIAGNOSIS — Z741 Need for assistance with personal care: Secondary | ICD-10-CM | POA: Diagnosis not present

## 2022-08-06 DIAGNOSIS — I69354 Hemiplegia and hemiparesis following cerebral infarction affecting left non-dominant side: Secondary | ICD-10-CM | POA: Diagnosis not present

## 2022-08-06 NOTE — Progress Notes (Unsigned)
Provider:Oriel Ojo Nyoka Cowden NP  Location  [facility]: SN[facility]   PCP: Gerlene Fee, NP Extended Emergency Contact Information Primary Emergency Contact: Arsenio Loader [place] Phone: [phone] Mobile Phone: [phone] Relation: Daughter Secondary Emergency Contact: Levora Dredge [place] Mobile Phone: [phone] Relation: Daughter  Codes status: dnr  Goals of care: advanced directive information    06/04/2022    8:52 AM  Advanced Directives  Does Patient Have a Medical Advance Directive? Yes  Type of Advance Directive Out of facility DNR (pink MOST or yellow form)  Does patient want to make changes to medical advance directive? No - Patient declined  Would patient like information on creating a medical advance directive? No - Patient declined  Pre-existing out of facility DNR order (yellow form or pink MOST form) Pink MOST form placed in chart (order not valid for inpatient use)     Allergies  Allergen Reactions   Hydrochlorothiazide     05/29/2021 K+ 2.6 Other reaction(s): Other (See Comments) 05/29/2021 K+ 2.6   Codeine Nausea Only    Chief Complaint  Patient presents with   Annual Exam    HPI  She is a 88 year old resident of this facility being seen for her annual exam. She has not required any visits to the ED and no hospitalizations over the past year. She continues to be wheelchair bound. Her family is very supportive. She has had many falls over the past year without injury. She will continue to be followed for her chronic illnesses including:   Vascular dementia without behavioral disturbance: Hypoalbuminemia due to protein calorie malnutrition; Marland Kitchen Vitamin B 12 deficiency Postoperative hypothyroidism   Past Medical History:  Diagnosis Date   Decreased sense of smell    and taste-negative CT scan-2011   Depression    Essential hypertension    GERD (gastroesophageal reflux disease)    Heart murmur    heard first time 4/12    History of mammogram    2010 and she does not want to repeat them anymore   HOCM (hypertrophic obstructive cardiomyopathy) ([place])    a. Dx by echo 05/2014.   Hypothyroidism    IBS (irritable bowel syndrome)    Impaired fasting glucose    Low back pain    Lumbar radiculopathy    with negative MRI (1991)   Postmenopausal    with osteopenia, bisphosphonates 1/05-1/11, improved osteopenia 4/12-repeat planned late 2015   Shingles    2015   Thyroid nodule    Vitamin D deficiency    repleted on weekly vitamin D 50000 iu   Past Surgical History:  Procedure Laterality Date   ABDOMINAL HYSTERECTOMY     BREAST BIOPSY     x2   CATARACT EXTRACTION     removal with IOL bilateral   GLAUCOMA SURGERY     laser   skin cancer removal     THYROIDECTOMY     subtotal    reports that she has never smoked. She has never used smokeless tobacco. She reports that she does not drink alcohol and does not use drugs. Social History   Tobacco Use   Smoking status: Never   Smokeless tobacco: Never  Vaping Use   Vaping Use: Never used  Substance Use Topics   Alcohol use: No    Alcohol/week: 0.0 standard drinks of alcohol   Drug use: Never   Family History  Problem Relation Age of Onset   Breast cancer Mother  Hypertension Father    CVA Father    Heart failure Sister    CAD Sister    Kidney disease Sister    COPD Brother    Lung cancer Brother    CAD Brother    Diabetes Brother    Stroke Father    Heart attack Neg Hx     Pertinent  Health Maintenance Due  Topic Date Due   INFLUENZA VACCINE  05/22/2022   DEXA SCAN  08/07/2022 (Originally 06/22/1998)      10/20/2021    3:40 PM 10/24/2021    1:07 PM 11/02/2021    1:01 PM 02/08/2022    2:07 PM 06/04/2022    8:53 AM  Fall Risk  Falls in the past year? 1 0 '1 1 1  '$ Was there an injury with Fall? 0  0 0 1  Fall Risk Category Calculator '2  2 2 3  '$ Fall Risk Category Moderate  Moderate Moderate High  Patient Fall Risk Level Moderate fall  risk  Moderate fall risk Moderate fall risk Moderate fall risk  Patient at Risk for Falls Due to History of fall(s);Impaired balance/gait  History of fall(s);Impaired balance/gait History of fall(s);Impaired balance/gait;Impaired mobility History of fall(s)  Fall risk Follow up Falls evaluation completed  Falls evaluation completed  Falls evaluation completed      02/08/2022    2:06 PM 11/02/2021    1:01 PM 10/24/2021    1:07 PM 10/20/2021    3:40 PM 08/24/2021   12:10 PM  Depression screen PHQ 2/9  Decreased Interest 0 0 0 0 0  Down, Depressed, Hopeless 0 0 0 0 0  PHQ - 2 Score 0 0 0 0 0  Altered sleeping 0 0   0  Tired, decreased energy 0 0   0  Change in appetite 0 0   0  Feeling bad or failure about yourself  0 0   0  Trouble concentrating 0 0   0  Moving slowly or fidgety/restless 0 0   0  Suicidal thoughts 0 0   0  PHQ-9 Score 0 0   0    Functional Status Survey:    Outpatient Encounter Medications as of 08/06/2022  Medication Sig   acetaminophen (TYLENOL) 325 MG tablet Take 650 mg by mouth every 6 (six) hours.   amantadine (SYMMETREL) 50 MG/5ML solution Take 50 mg by mouth 2 (two) times daily. With breakfast and lunch   Amino Acids-Protein Hydrolys (FEEDING SUPPLEMENT, PRO-STAT SUGAR FREE 64,) LIQD Take 30 mLs by mouth 3 (three) times daily with meals. for albumin 2.8  15-100 gram-kcal/30 mL; amt: 60 cc; oral Special Instructions: for albumin of 2.8 with meal Once A Day   aspirin EC 81 MG EC tablet Take 1 tablet (81 mg total) by mouth daily with breakfast. Swallow whole.   Cholecalciferol (VITAMIN D-3) 25 MCG (1000 UT) CAPS Take 1 capsule by mouth daily.   donepezil (ARICEPT) 5 MG tablet Take 1 tablet (5 mg total) by mouth at bedtime.   famotidine (PEPCID) 20 MG tablet Take 1 tablet (20 mg total) by mouth daily.   levothyroxine (SYNTHROID, LEVOTHROID) 75 MCG tablet Take 75 mcg by mouth daily before breakfast.    memantine (NAMENDA XR) 28 MG CP24 24 hr capsule Take 28 mg by  mouth daily.   Menthol, Topical Analgesic, (BIOFREEZE) 4 % GEL Apply topically as needed. Left shoulder pain   NON FORMULARY Diet:  Regular, thin liquids- Add prune juice to Breakfast tray  oxymetazoline (AFRIN) 0.05 % nasal spray Place 1 spray into both nostrils as needed. epistaxis   polyethylene glycol (MIRALAX / GLYCOLAX) 17 g packet Take 17 g by mouth daily.   sertraline (ZOLOFT) 25 MG tablet Take 25 mg by mouth daily.  to be given with '50mg'$  to = '75mg'$    sertraline (ZOLOFT) 50 MG tablet Take 50 mg by mouth daily. to be given with '25mg'$  to = '75mg'$    sodium chloride (OCEAN) 0.65 % SOLN nasal spray Place 1 spray into both nostrils 3 (three) times daily. And as needed   vitamin C (ASCORBIC ACID) 500 MG tablet Take 500 mg by mouth daily.   [DISCONTINUED] acetaminophen (TYLENOL) 325 MG tablet Take 2 tablets (650 mg total) by mouth every 4 (four) hours as needed for mild pain (or temp > 37.5 C (99.5 F)). (Patient taking differently: Take 650 mg by mouth every 6 (six) hours.)   No facility-administered encounter medications on file as of 08/06/2022.     Vitals:   08/06/22 1041  BP: (!) 153/83  Pulse: 76  Resp: 20  Temp: (!) 97.1 F (36.2 C)  SpO2: 96%  Weight: 135 lb 12.8 oz (61.6 kg)  Height: '5\' 2"'$  (1.575 m)   Body mass index is 24.84 kg/m.  DIAGNOSTIC EXAMS  NO NEW EXAMS  LABS REVIEWED PREVIOUS   07-28-21: wbc 9.5; hgb 12.8; hct 38.4; mcv 993.7 plt 237; glucose 102; bun 25; creat 0.81; k+ 3.3; na++ 139; ca 8.2 GFR>60; liver normal albumin 3.2; tsh 2.457 vit B 12: 261; folate 9. 09-07-21 k+ 3.5 02-12-22: wbc 7.8; hgb 14.8; hct 44.7; mcv 89.2 plt 221; glucose 180; bun 24; creat 0.89; k+ 4.0; na++ 141; ca 9.0; GFR 56; protein 6.6; albumin 3.6; hgb a1c 5.5; tsh 2.305 free t4: 0.94; vitamin B12: 2092; vitamin B6: 72.2 vitamin D 24.4    NO NEW LABS.    Review of Systems  Constitutional:  Negative for malaise/fatigue.  Respiratory:  Negative for cough and shortness of breath.    Cardiovascular:  Negative for chest pain, palpitations and leg swelling.  Gastrointestinal:  Negative for abdominal pain, constipation and heartburn.  Musculoskeletal:  Negative for back pain, joint pain and myalgias.  Skin: Negative.   Neurological:  Negative for dizziness.  Psychiatric/Behavioral:  The patient is not nervous/anxious.    Physical Exam Constitutional:      General: She is not in acute distress.    Appearance: She is well-developed. She is not diaphoretic.  HENT:     Nose: Nose normal.     Mouth/Throat:     Mouth: Mucous membranes are moist.     Pharynx: Oropharynx is clear.  Eyes:     Conjunctiva/sclera: Conjunctivae normal.  Neck:     Thyroid: No thyromegaly.  Cardiovascular:     Rate and Rhythm: Normal rate and regular rhythm.     Pulses: Normal pulses.     Heart sounds: Normal heart sounds.  Pulmonary:     Effort: Pulmonary effort is normal. No respiratory distress.     Breath sounds: Normal breath sounds.  Abdominal:     General: Bowel sounds are normal. There is no distension.     Palpations: Abdomen is soft.     Tenderness: There is no abdominal tenderness.  Musculoskeletal:     Cervical back: Neck supple.     Right lower leg: No edema.     Left lower leg: No edema.     Comments: Left hemiplegia   Lymphadenopathy:  Cervical: No cervical adenopathy.  Skin:    General: Skin is warm and dry.  Neurological:     Mental Status: She is alert. Mental status is at baseline.  Psychiatric:        Mood and Affect: Mood normal.      ASSESSMENT/ PLAN:  TODAY  Vascular dementia without behavioral disturbance: weight is 135 pounds; will continue aricept 5 mg nightly and namenda xr 28 mg daily   2. Hypoalbuminemia due to protein calorie malnutrition; albumin 3.4 will continue prostat 3 times daily   3. Vitamin B 12: 2092 is off supplement  4. Postoperative hypothyroidism:   tsh 2.457 will continue synthroid 75 mcg daily   5. Vitamin D deficiency:  is 24.4 is on 1000 units daily   6. Mixed hyperlipidemia: is off statin due to her advanced age  26. Psychosis in elderly without behavorial disturbance: is off seroquel  8. Depression recurrent will continue zoloft 75 mg daily   9. GERD without esophagitis: will continue pepcid 20 mg daily  10. Chronic constipation: will continue miralax every other day  11. Thoracic aortic atherosclerosis: (cxr 12-15-14) not on statin due to her advanced age  24. History of stroke/left hemiplegia: is on asa 81 mg daily   13. Somnolence post stroke: is on amantadine 50 mg twice daily   14. HOCM (hypertrophic obstructive cardiomyopathy) will monitor  15. Dysphagia: no indications of aspiration; is on thin liquids  16. Stage 3a chronic kidney disease: bun 24; creat 0.8; gfr >60  17. Essential hypertension: 153/83: at this time will not make changes; if her readings remain elevated will make changes at that time.    18. Interstitial lung disease: is stable.    Will check cbc; cmp; vitamin D; tsh free t4   Ok Edwards NP [facility]  call [phone]

## 2022-08-08 DIAGNOSIS — I63211 Cerebral infarction due to unspecified occlusion or stenosis of right vertebral arteries: Secondary | ICD-10-CM | POA: Diagnosis not present

## 2022-08-08 DIAGNOSIS — Z741 Need for assistance with personal care: Secondary | ICD-10-CM | POA: Diagnosis not present

## 2022-08-08 DIAGNOSIS — M6281 Muscle weakness (generalized): Secondary | ICD-10-CM | POA: Diagnosis not present

## 2022-08-08 DIAGNOSIS — R279 Unspecified lack of coordination: Secondary | ICD-10-CM | POA: Diagnosis not present

## 2022-08-08 DIAGNOSIS — I69354 Hemiplegia and hemiparesis following cerebral infarction affecting left non-dominant side: Secondary | ICD-10-CM | POA: Diagnosis not present

## 2022-08-08 DIAGNOSIS — F0390 Unspecified dementia without behavioral disturbance: Secondary | ICD-10-CM | POA: Diagnosis not present

## 2022-08-09 ENCOUNTER — Non-Acute Institutional Stay (SKILLED_NURSING_FACILITY): Payer: Medicare Other | Admitting: Adult Health

## 2022-08-09 ENCOUNTER — Encounter: Payer: Self-pay | Admitting: Adult Health

## 2022-08-09 ENCOUNTER — Encounter (HOSPITAL_COMMUNITY)
Admission: RE | Admit: 2022-08-09 | Discharge: 2022-08-09 | Disposition: A | Discharge status: Home / Self Care | Payer: Medicare Other | Source: Skilled Nursing Facility | Attending: Internal Medicine | Admitting: Internal Medicine

## 2022-08-09 DIAGNOSIS — R768 Other specified abnormal immunological findings in serum: Secondary | ICD-10-CM

## 2022-08-09 DIAGNOSIS — J1282 Pneumonia due to coronavirus disease 2019: Secondary | ICD-10-CM | POA: Diagnosis not present

## 2022-08-09 DIAGNOSIS — R918 Other nonspecific abnormal finding of lung field: Secondary | ICD-10-CM | POA: Diagnosis not present

## 2022-08-09 DIAGNOSIS — R7982 Elevated C-reactive protein (CRP): Secondary | ICD-10-CM | POA: Diagnosis not present

## 2022-08-09 DIAGNOSIS — U071 COVID-19: Secondary | ICD-10-CM | POA: Diagnosis not present

## 2022-08-09 LAB — C-REACTIVE PROTEIN: CRP: 3.8 mg/dL — ABNORMAL HIGH (ref <1.0)

## 2022-08-09 LAB — BASIC METABOLIC PANEL
Anion gap: 9 (ref 5–15)
BUN: 26 mg/dL — ABNORMAL HIGH (ref 8–23)
CO2: 24 mmol/L (ref 22–32)
Calcium: 8.7 mg/dL — ABNORMAL LOW (ref 8.9–10.3)
Chloride: 105 mmol/L (ref 98–111)
Creatinine, Ser: 0.98 mg/dL (ref 0.44–1.00)
GFR, Estimated: 55 mL/min — ABNORMAL LOW (ref >60)
Glucose, Bld: 133 mg/dL — ABNORMAL HIGH (ref 70–99)
Potassium: 3.5 mmol/L (ref 3.5–5.1)
Sodium: 138 mmol/L (ref 135–145)

## 2022-08-09 LAB — CBC
HCT: 43.2 % (ref 36.0–46.0)
Hemoglobin: 14.3 g/dL (ref 12.0–15.0)
MCH: 29.7 pg (ref 26.0–34.0)
MCHC: 33.1 g/dL (ref 30.0–36.0)
MCV: 89.6 fL (ref 80.0–100.0)
Platelets: 188 10*3/uL (ref 150–400)
RBC: 4.82 MIL/uL (ref 3.87–5.11)
RDW: 15 % (ref 11.5–15.5)
WBC: 9.7 10*3/uL (ref 4.0–10.5)
nRBC: 0 % (ref 0.0–0.2)

## 2022-08-09 LAB — D-DIMER, QUANTITATIVE: D-Dimer, Quant: 0.95 ug/mL-FEU — ABNORMAL HIGH (ref 0.00–0.50)

## 2022-08-09 NOTE — Progress Notes (Signed)
Location:  [address] Room Number: 138 Place of Service:  SNF [phone]) Provider:  Noralyn Pick, NP   CODE STATUS: DNR  Allergies  Allergen Reactions   Hydrochlorothiazide     05/29/2021 K+ 2.6 Other reaction(s): Other (See Comments) 05/29/2021 K+ 2.6   Codeine Nausea Only    Chief Complaint  Patient presents with   Acute Visit    COVID    HPI:  She has tested positive for covid. She has no complaints of body aches or pain; no changes in appetite; no nausea. Does have a headache. No reports of fevers present.   Past Medical History:  Diagnosis Date   Decreased sense of smell    and taste-negative CT scan-2011   Depression    Essential hypertension    GERD (gastroesophageal reflux disease)    Heart murmur    heard first time 4/12   History of mammogram    2010 and she does not want to repeat them anymore   HOCM (hypertrophic obstructive cardiomyopathy) ([place])    a. Dx by echo 05/2014.   Hypothyroidism    IBS (irritable bowel syndrome)    Impaired fasting glucose    Low back pain    Lumbar radiculopathy    with negative MRI (1991)   Postmenopausal    with osteopenia, bisphosphonates 1/05-1/11, improved osteopenia 4/12-repeat planned late 2015   Shingles    2015   Thyroid nodule    Vitamin D deficiency    repleted on weekly vitamin D 50000 iu    Past Surgical History:  Procedure Laterality Date   ABDOMINAL HYSTERECTOMY     BREAST BIOPSY     x2   CATARACT EXTRACTION     removal with IOL bilateral   GLAUCOMA SURGERY     laser   skin cancer removal     THYROIDECTOMY     subtotal    Social History   Socioeconomic History   Marital status: Married    Spouse name: Not on file   Number of children: Not on file   Years of education: Not on file   Highest education level: Not on file  Occupational History   Not on file  Tobacco Use   Smoking status: Never   Smokeless tobacco: Never  Vaping Use   Vaping Use: Never used  Substance  and Sexual Activity   Alcohol use: No    Alcohol/week: 0.0 standard drinks of alcohol   Drug use: Never   Sexual activity: Not on file  Other Topics Concern   Not on file  Social History Narrative   Not on file   Social Determinants of Health   Financial Resource Strain: Not on file  Food Insecurity: Not on file  Transportation Needs: Not on file  Physical Activity: Not on file  Stress: Not on file  Social Connections: Not on file  Intimate Partner Violence: Not on file   Family History  Problem Relation Age of Onset   Breast cancer Mother    Hypertension Father    CVA Father    Heart failure Sister    CAD Sister    Kidney disease Sister    COPD Brother    Lung cancer Brother    CAD Brother    Diabetes Brother    Stroke Father    Heart attack Neg Hx       VITAL SIGNS BP (!) 153/83   Pulse 76   Temp (!) 97.1 F (36.2 C)  Resp 20   Ht '5\' 2"'$  (1.575 m)   Wt 135 lb 12.8 oz (61.6 kg)   SpO2 96%   BMI 24.84 kg/m   Outpatient Encounter Medications as of 08/09/2022  Medication Sig   acetaminophen (TYLENOL) 325 MG tablet Take 650 mg by mouth every 6 (six) hours.   amantadine (SYMMETREL) 50 MG/5ML solution Take 50 mg by mouth 2 (two) times daily. With breakfast and lunch   Amino Acids-Protein Hydrolys (FEEDING SUPPLEMENT, PRO-STAT SUGAR FREE 64,) LIQD Take 30 mLs by mouth 3 (three) times daily with meals. for albumin 2.8  15-100 gram-kcal/30 mL; amt: 60 cc; oral Special Instructions: for albumin of 2.8 with meal Once A Day   aspirin EC 81 MG EC tablet Take 1 tablet (81 mg total) by mouth daily with breakfast. Swallow whole.   Cholecalciferol (VITAMIN D-3) 25 MCG (1000 UT) CAPS Take 1 capsule by mouth daily.   donepezil (ARICEPT) 5 MG tablet Take 1 tablet (5 mg total) by mouth at bedtime.   famotidine (PEPCID) 20 MG tablet Take 1 tablet (20 mg total) by mouth daily.   levothyroxine (SYNTHROID, LEVOTHROID) 75 MCG tablet Take 75 mcg by mouth daily before breakfast.     memantine (NAMENDA XR) 28 MG CP24 24 hr capsule Take 28 mg by mouth daily.   Menthol, Topical Analgesic, (BIOFREEZE) 4 % GEL Apply topically as needed. Left shoulder pain   NON FORMULARY Diet:  Regular, thin liquids- Add prune juice to Breakfast tray   oxymetazoline (AFRIN) 0.05 % nasal spray Place 1 spray into both nostrils as needed. epistaxis   polyethylene glycol (MIRALAX / GLYCOLAX) 17 g packet Take 17 g by mouth daily.   sertraline (ZOLOFT) 25 MG tablet Take 25 mg by mouth daily.  to be given with '50mg'$  to = '75mg'$    sertraline (ZOLOFT) 50 MG tablet Take 50 mg by mouth daily. to be given with '25mg'$  to = '75mg'$    sodium chloride (OCEAN) 0.65 % SOLN nasal spray Place 1 spray into both nostrils 3 (three) times daily. And as needed   vitamin C (ASCORBIC ACID) 500 MG tablet Take 500 mg by mouth daily.   No facility-administered encounter medications on file as of 08/09/2022.     SIGNIFICANT DIAGNOSTIC EXAMS  NO NEW EXAMS  LABS REVIEWED PREVIOUS   07-28-21: wbc 9.5; hgb 12.8; hct 38.4; mcv 993.7 plt 237; glucose 102; bun 25; creat 0.81; k+ 3.3; na++ 139; ca 8.2 GFR>60; liver normal albumin 3.2; tsh 2.457 vit B 12: 261; folate 9. 09-07-21 k+ 3.5 02-12-22: wbc 7.8; hgb 14.8; hct 44.7; mcv 89.2 plt 221; glucose 180; bun 24; creat 0.89; k+ 4.0; na++ 141; ca 9.0; GFR 56; protein 6.6; albumin 3.6; hgb a1c 5.5; tsh 2.305 free t4: 0.94; vitamin B12: 2092; vitamin B6: 72.2 vitamin D 24.4    TODAY  08-09-22: wbc 9.7; hgb 14.3; hct 43.2; mcv 89.6 plt 188; glucose 133; bun 26; creat 0.98; k+ 3.5; na++ 138; ca 8.7; gfr 55; ddimer: 0.95 crp 3.8      Review of Systems  Constitutional:  Negative for malaise/fatigue.  Respiratory:  Negative for cough and shortness of breath.   Cardiovascular:  Negative for chest pain, palpitations and leg swelling.  Gastrointestinal:  Negative for abdominal pain, constipation and heartburn.  Musculoskeletal:  Negative for back pain, joint pain and myalgias.  Skin:  Negative.   Neurological:  Negative for dizziness.  Psychiatric/Behavioral:  The patient is not nervous/anxious.     Physical Exam Constitutional:  General: She is not in acute distress.    Appearance: She is well-developed. She is not diaphoretic.  Neck:     Thyroid: No thyromegaly.  Cardiovascular:     Rate and Rhythm: Normal rate and regular rhythm.     Pulses: Normal pulses.     Heart sounds: Normal heart sounds.  Pulmonary:     Effort: Pulmonary effort is normal. No respiratory distress.     Breath sounds: Normal breath sounds.  Abdominal:     General: Bowel sounds are normal. There is no distension.     Palpations: Abdomen is soft.     Tenderness: There is no abdominal tenderness.  Musculoskeletal:     Cervical back: Neck supple.     Right lower leg: No edema.     Left lower leg: No edema.     Comments:  Left hemiplegia    Lymphadenopathy:     Cervical: No cervical adenopathy.  Skin:    General: Skin is warm and dry.  Neurological:     Mental Status: She is alert. Mental status is at baseline.  Psychiatric:        Mood and Affect: Mood normal.       ASSESSMENT/ PLAN:  TODAY:   SARS -Co2-antibody positive will begin vitamin c, d, zinc molnupiravir 800 mg twice daily for 5 days.    Ok Edwards NP [facility]  call [phone]

## 2022-08-10 ENCOUNTER — Non-Acute Institutional Stay (SKILLED_NURSING_FACILITY): Payer: Medicare Other | Admitting: Adult Health

## 2022-08-10 ENCOUNTER — Encounter: Payer: Self-pay | Admitting: Adult Health

## 2022-08-10 DIAGNOSIS — R7982 Elevated C-reactive protein (CRP): Secondary | ICD-10-CM

## 2022-08-10 DIAGNOSIS — R768 Other specified abnormal immunological findings in serum: Secondary | ICD-10-CM

## 2022-08-10 DIAGNOSIS — U071 COVID-19: Secondary | ICD-10-CM

## 2022-08-10 DIAGNOSIS — J1282 Pneumonia due to coronavirus disease 2019: Secondary | ICD-10-CM | POA: Diagnosis not present

## 2022-08-10 NOTE — Progress Notes (Unsigned)
Location:  [facility] Room Number: NO/138/P Place of Service:  SNF (31)   CODE STATUS: DNR  Allergies  Allergen Reactions   Hydrochlorothiazide     05/29/2021 K+ 2.6 Other reaction(s): Other (See Comments) 05/29/2021 K+ 2.6   Codeine Nausea Only    Chief Complaint  Patient presents with   Acute Visit    Patient is being seen for a follow up on recent chest xray    HPI:  She is presently being treated for covid. She does have a cough present. There are no reports of fever present. Her chest x-ray demonstrates mild prominent interstitial densities bilateral compatible or pneumonitis. Her crp is elevated as well.   Past Medical History:  Diagnosis Date   Decreased sense of smell    and taste-negative CT scan-2011   Depression    Essential hypertension    GERD (gastroesophageal reflux disease)    Heart murmur    heard first time 4/12   History of mammogram    2010 and she does not want to repeat them anymore   HOCM (hypertrophic obstructive cardiomyopathy) ([place])    a. Dx by echo 05/2014.   Hypothyroidism    IBS (irritable bowel syndrome)    Impaired fasting glucose    Low back pain    Lumbar radiculopathy    with negative MRI (1991)   Postmenopausal    with osteopenia, bisphosphonates 1/05-1/11, improved osteopenia 4/12-repeat planned late 2015   Shingles    2015   Thyroid nodule    Vitamin D deficiency    repleted on weekly vitamin D 50000 iu    Past Surgical History:  Procedure Laterality Date   ABDOMINAL HYSTERECTOMY     BREAST BIOPSY     x2   CATARACT EXTRACTION     removal with IOL bilateral   GLAUCOMA SURGERY     laser   skin cancer removal     THYROIDECTOMY     subtotal    Social History   Socioeconomic History   Marital status: Married    Spouse name: Not on file   Number of children: Not on file   Years of education: Not on file   Highest education level: Not on file  Occupational History   Not on file  Tobacco Use    Smoking status: Never   Smokeless tobacco: Never  Vaping Use   Vaping Use: Never used  Substance and Sexual Activity   Alcohol use: No    Alcohol/week: 0.0 standard drinks of alcohol   Drug use: Never   Sexual activity: Not on file  Other Topics Concern   Not on file  Social History Narrative   Not on file   Social Determinants of Health   Financial Resource Strain: Not on file  Food Insecurity: Not on file  Transportation Needs: Not on file  Physical Activity: Not on file  Stress: Not on file  Social Connections: Not on file  Intimate Partner Violence: Not on file   Family History  Problem Relation Age of Onset   Breast cancer Mother    Hypertension Father    CVA Father    Heart failure Sister    CAD Sister    Kidney disease Sister    COPD Brother    Lung cancer Brother    CAD Brother    Diabetes Brother    Stroke Father    Heart attack Neg Hx       VITAL SIGNS BP 130/78  Pulse 89   Resp 20   Ht '5\' 2"'$  (1.575 m)   Wt 135 lb (61.2 kg)   SpO2 94%   BMI 24.69 kg/m   Outpatient Encounter Medications as of 08/10/2022  Medication Sig   acetaminophen (TYLENOL) 325 MG tablet Take 650 mg by mouth every 6 (six) hours.   amantadine (SYMMETREL) 50 MG/5ML solution Take 50 mg by mouth 2 (two) times daily. With breakfast and lunch   Amino Acids-Protein Hydrolys (FEEDING SUPPLEMENT, PRO-STAT SUGAR FREE 64,) LIQD Take 30 mLs by mouth 3 (three) times daily with meals. for albumin 2.8  15-100 gram-kcal/30 mL; amt: 60 cc; oral Special Instructions: for albumin of 2.8 with meal Once A Day   aspirin EC 81 MG EC tablet Take 1 tablet (81 mg total) by mouth daily with breakfast. Swallow whole.   Cholecalciferol (VITAMIN D-3) 25 MCG (1000 UT) CAPS Take 1 capsule by mouth daily.   donepezil (ARICEPT) 5 MG tablet Take 1 tablet (5 mg total) by mouth at bedtime.   famotidine (PEPCID) 20 MG tablet Take 1 tablet (20 mg total) by mouth daily.   levothyroxine (SYNTHROID,  LEVOTHROID) 75 MCG tablet Take 75 mcg by mouth daily before breakfast.    memantine (NAMENDA XR) 28 MG CP24 24 hr capsule Take 28 mg by mouth daily.   Menthol, Topical Analgesic, (BIOFREEZE) 4 % GEL Apply topically as needed. Left shoulder pain   NON FORMULARY Diet:  Regular, thin liquids- Add prune juice to Breakfast tray   oxymetazoline (AFRIN) 0.05 % nasal spray Place 1 spray into both nostrils as needed. epistaxis   polyethylene glycol (MIRALAX / GLYCOLAX) 17 g packet Take 17 g by mouth daily.   sertraline (ZOLOFT) 25 MG tablet Take 25 mg by mouth daily.  to be given with '50mg'$  to = '75mg'$    sertraline (ZOLOFT) 50 MG tablet Take 50 mg by mouth daily. to be given with '25mg'$  to = '75mg'$    sodium chloride (OCEAN) 0.65 % SOLN nasal spray Place 1 spray into both nostrils 3 (three) times daily. And as needed   vitamin C (ASCORBIC ACID) 500 MG tablet Take 500 mg by mouth daily.   No facility-administered encounter medications on file as of 08/10/2022.     SIGNIFICANT DIAGNOSTIC EXAMS  TODAY  08-09-22: chest x-ray: Mild prominent interstitial densities bilateral compatible with pneumonitis. Mild cardiomegaly; mild osteopenia; mild osteoarthritis.   LABS REVIEWED PREVIOUS   07-28-21: wbc 9.5; hgb 12.8; hct 38.4; mcv 993.7 plt 237; glucose 102; bun 25; creat 0.81; k+ 3.3; na++ 139; ca 8.2 GFR>60; liver normal albumin 3.2; tsh 2.457 vit B 12: 261; folate 9. 09-07-21 k+ 3.5 02-12-22: wbc 7.8; hgb 14.8; hct 44.7; mcv 89.2 plt 221; glucose 180; bun 24; creat 0.89; k+ 4.0; na++ 141; ca 9.0; GFR 56; protein 6.6; albumin 3.6; hgb a1c 5.5; tsh 2.305 free t4: 0.94; vitamin B12: 2092; vitamin B6: 72.2 vitamin D 24.4    TODAY  08-09-22: wbc 9.7; hgb 14.3; hct 43.2; mcv 89.6 plt 188; glucose 133; bun 26; creat 0.98; k+ 3.5; na++ 138; ca 8.7; gfr 55; ddimer: 0.95 crp 3.8    Review of Systems  Constitutional:  Negative for malaise/fatigue.  Respiratory:  Positive for cough. Negative for shortness of breath.    Cardiovascular:  Negative for chest pain, palpitations and leg swelling.  Gastrointestinal:  Negative for abdominal pain, constipation and heartburn.  Musculoskeletal:  Negative for back pain, joint pain and myalgias.  Skin: Negative.   Neurological:  Negative  for dizziness.  Psychiatric/Behavioral:  The patient is not nervous/anxious.    Physical Exam Constitutional:      General: She is not in acute distress.    Appearance: She is well-developed. She is not diaphoretic.  Neck:     Thyroid: No thyromegaly.  Cardiovascular:     Rate and Rhythm: Normal rate and regular rhythm.     Pulses: Normal pulses.     Heart sounds: Normal heart sounds.  Pulmonary:     Effort: Pulmonary effort is normal. No respiratory distress.     Breath sounds: Rhonchi present.  Abdominal:     General: Bowel sounds are normal. There is no distension.     Palpations: Abdomen is soft.     Tenderness: There is no abdominal tenderness.  Musculoskeletal:     Cervical back: Neck supple.     Right lower leg: No edema.     Left lower leg: No edema.     Comments: Left hemiplegia   Lymphadenopathy:     Cervical: No cervical adenopathy.  Skin:    General: Skin is warm and dry.  Neurological:     Mental Status: She is alert. Mental status is at baseline.  Psychiatric:        Mood and Affect: Mood normal.      ASSESSMENT/ PLAN:  TODAY  SARS-Co2 positive antibody Pneumonia due to covid virus Elevated crp  Will begin prednisone 40 mg daily for 5 days Will begin augmentin 875 mg twice daily through 08-16-22   Ok Edwards NP [facility]   call [phone]

## 2022-08-13 ENCOUNTER — Non-Acute Institutional Stay (SKILLED_NURSING_FACILITY): Payer: Medicare Other | Admitting: Adult Health

## 2022-08-13 ENCOUNTER — Encounter: Payer: Self-pay | Admitting: Adult Health

## 2022-08-13 ENCOUNTER — Other Ambulatory Visit (HOSPITAL_COMMUNITY)
Admission: RE | Admit: 2022-08-13 | Discharge: 2022-08-13 | Disposition: A | Discharge status: Home / Self Care | Payer: Medicare Other | Source: Skilled Nursing Facility | Attending: Adult Health | Admitting: Adult Health

## 2022-08-13 DIAGNOSIS — E039 Hypothyroidism, unspecified: Secondary | ICD-10-CM | POA: Diagnosis not present

## 2022-08-13 DIAGNOSIS — R7989 Other specified abnormal findings of blood chemistry: Secondary | ICD-10-CM | POA: Diagnosis not present

## 2022-08-13 DIAGNOSIS — E559 Vitamin D deficiency, unspecified: Secondary | ICD-10-CM | POA: Diagnosis not present

## 2022-08-13 DIAGNOSIS — E890 Postprocedural hypothyroidism: Secondary | ICD-10-CM

## 2022-08-13 DIAGNOSIS — F0150 Vascular dementia without behavioral disturbance: Secondary | ICD-10-CM | POA: Insufficient documentation

## 2022-08-13 LAB — COMPREHENSIVE METABOLIC PANEL
ALT: 33 U/L (ref 0–44)
AST: 38 U/L (ref 15–41)
Albumin: 3.1 g/dL — ABNORMAL LOW (ref 3.5–5.0)
Alkaline Phosphatase: 47 U/L (ref 38–126)
Anion gap: 10 (ref 5–15)
BUN: 37 mg/dL — ABNORMAL HIGH (ref 8–23)
CO2: 26 mmol/L (ref 22–32)
Calcium: 8.6 mg/dL — ABNORMAL LOW (ref 8.9–10.3)
Chloride: 103 mmol/L (ref 98–111)
Creatinine, Ser: 0.93 mg/dL (ref 0.44–1.00)
GFR, Estimated: 59 mL/min — ABNORMAL LOW (ref >60)
Glucose, Bld: 97 mg/dL (ref 70–99)
Potassium: 4.1 mmol/L (ref 3.5–5.1)
Sodium: 139 mmol/L (ref 135–145)
Total Bilirubin: 0.4 mg/dL (ref 0.3–1.2)
Total Protein: 6.1 g/dL — ABNORMAL LOW (ref 6.5–8.1)

## 2022-08-13 LAB — CBC
HCT: 40.8 % (ref 36.0–46.0)
Hemoglobin: 13.4 g/dL (ref 12.0–15.0)
MCH: 29.3 pg (ref 26.0–34.0)
MCHC: 32.8 g/dL (ref 30.0–36.0)
MCV: 89.1 fL (ref 80.0–100.0)
Platelets: 207 10*3/uL (ref 150–400)
RBC: 4.58 MIL/uL (ref 3.87–5.11)
RDW: 14.7 % (ref 11.5–15.5)
WBC: 7.5 10*3/uL (ref 4.0–10.5)
nRBC: 0 % (ref 0.0–0.2)

## 2022-08-13 LAB — TSH: TSH: 0.179 u[IU]/mL — ABNORMAL LOW (ref 0.350–4.500)

## 2022-08-13 LAB — T4, FREE: Free T4: 0.96 ng/dL (ref 0.61–1.12)

## 2022-08-13 LAB — VITAMIN B12: Vitamin B-12: 362 pg/mL (ref 180–914)

## 2022-08-13 LAB — VITAMIN D 25 HYDROXY (VIT D DEFICIENCY, FRACTURES): Vit D, 25-Hydroxy: 35.44 ng/mL (ref 30–100)

## 2022-08-13 NOTE — Progress Notes (Unsigned)
Location:  [facility] Room Number: 138 Place of Service:  SNF (31)   CODE STATUS: dnr   Allergies  Allergen Reactions   Hydrochlorothiazide     05/29/2021 K+ 2.6 Other reaction(s): Other (See Comments) 05/29/2021 K+ 2.6   Codeine Nausea Only    Chief Complaint  Patient presents with   Acute Visit    Follow up lab results     HPI:  Her tsh is low at 0.179.she is presently taking synthroid 75 mcg daily. She is on vitamin D   Past Medical History:  Diagnosis Date   Decreased sense of smell    and taste-negative CT scan-2011   Depression    Essential hypertension    GERD (gastroesophageal reflux disease)    Heart murmur    heard first time 4/12   History of mammogram    2010 and she does not want to repeat them anymore   HOCM (hypertrophic obstructive cardiomyopathy) ([place])    a. Dx by echo 05/2014.   Hypothyroidism    IBS (irritable bowel syndrome)    Impaired fasting glucose    Low back pain    Lumbar radiculopathy    with negative MRI (1991)   Postmenopausal    with osteopenia, bisphosphonates 1/05-1/11, improved osteopenia 4/12-repeat planned late 2015   Shingles    2015   Thyroid nodule    Vitamin D deficiency    repleted on weekly vitamin D 50000 iu    Past Surgical History:  Procedure Laterality Date   ABDOMINAL HYSTERECTOMY     BREAST BIOPSY     x2   CATARACT EXTRACTION     removal with IOL bilateral   GLAUCOMA SURGERY     laser   skin cancer removal     THYROIDECTOMY     subtotal    Social History   Socioeconomic History   Marital status: Married    Spouse name: Not on file   Number of children: Not on file   Years of education: Not on file   Highest education level: Not on file  Occupational History   Not on file  Tobacco Use   Smoking status: Never   Smokeless tobacco: Never  Vaping Use   Vaping Use: Never used  Substance and Sexual Activity   Alcohol use: No    Alcohol/week: 0.0 standard drinks of alcohol    Drug use: Never   Sexual activity: Not on file  Other Topics Concern   Not on file  Social History Narrative   Not on file   Social Determinants of Health   Financial Resource Strain: Not on file  Food Insecurity: Not on file  Transportation Needs: Not on file  Physical Activity: Not on file  Stress: Not on file  Social Connections: Not on file  Intimate Partner Violence: Not on file   Family History  Problem Relation Age of Onset   Breast cancer Mother    Hypertension Father    CVA Father    Heart failure Sister    CAD Sister    Kidney disease Sister    COPD Brother    Lung cancer Brother    CAD Brother    Diabetes Brother    Stroke Father    Heart attack Neg Hx       VITAL SIGNS BP 124/74   Pulse 72   Temp (!) 97.2 F (36.2 C)   Resp 18   Ht '5\' 2"'$  (1.575 m)  Wt 135 lb 12.8 oz (61.6 kg)   SpO2 100%   BMI 24.84 kg/m   Outpatient Encounter Medications as of 08/13/2022  Medication Sig   acetaminophen (TYLENOL) 325 MG tablet Take 650 mg by mouth every 6 (six) hours.   amantadine (SYMMETREL) 50 MG/5ML solution Take 50 mg by mouth 2 (two) times daily. With breakfast and lunch   Amino Acids-Protein Hydrolys (FEEDING SUPPLEMENT, PRO-STAT SUGAR FREE 64,) LIQD Take 30 mLs by mouth 3 (three) times daily with meals. for albumin 2.8  15-100 gram-kcal/30 mL; amt: 60 cc; oral Special Instructions: for albumin of 2.8 with meal Once A Day   aspirin EC 81 MG EC tablet Take 1 tablet (81 mg total) by mouth daily with breakfast. Swallow whole.   Cholecalciferol (VITAMIN D-3) 25 MCG (1000 UT) CAPS Take 1 capsule by mouth daily.   donepezil (ARICEPT) 5 MG tablet Take 1 tablet (5 mg total) by mouth at bedtime.   famotidine (PEPCID) 20 MG tablet Take 1 tablet (20 mg total) by mouth daily.   levothyroxine (SYNTHROID, LEVOTHROID) 75 MCG tablet Take 75 mcg by mouth daily before breakfast.    memantine (NAMENDA XR) 28 MG CP24 24 hr capsule Take 28 mg by mouth daily.   Menthol,  Topical Analgesic, (BIOFREEZE) 4 % GEL Apply topically as needed. Left shoulder pain   NON FORMULARY Diet:  Regular, thin liquids- Add prune juice to Breakfast tray   oxymetazoline (AFRIN) 0.05 % nasal spray Place 1 spray into both nostrils as needed. epistaxis   polyethylene glycol (MIRALAX / GLYCOLAX) 17 g packet Take 17 g by mouth daily.   sertraline (ZOLOFT) 25 MG tablet Take 25 mg by mouth daily.  to be given with '50mg'$  to = '75mg'$    sertraline (ZOLOFT) 50 MG tablet Take 50 mg by mouth daily. to be given with '25mg'$  to = '75mg'$    sodium chloride (OCEAN) 0.65 % SOLN nasal spray Place 1 spray into both nostrils 3 (three) times daily. And as needed   vitamin C (ASCORBIC ACID) 500 MG tablet Take 500 mg by mouth daily.   No facility-administered encounter medications on file as of 08/13/2022.     SIGNIFICANT DIAGNOSTIC EXAMS  PREVIOUS   08-09-22: chest x-ray: Mild prominent interstitial densities bilateral compatible with pneumonitis. Mild cardiomegaly; mild osteopenia; mild osteoarthritis.   NO NEW EXAMS   LABS REVIEWED PREVIOUS   07-28-21: wbc 9.5; hgb 12.8; hct 38.4; mcv 993.7 plt 237; glucose 102; bun 25; creat 0.81; k+ 3.3; na++ 139; ca 8.2 GFR>60; liver normal albumin 3.2; tsh 2.457 vit B 12: 261; folate 9. 09-07-21 k+ 3.5 02-12-22: wbc 7.8; hgb 14.8; hct 44.7; mcv 89.2 plt 221; glucose 180; bun 24; creat 0.89; k+ 4.0; na++ 141; ca 9.0; GFR 56; protein 6.6; albumin 3.6; hgb a1c 5.5; tsh 2.305 free t4: 0.94; vitamin B12: 2092; vitamin B6: 72.2 vitamin D 24.4    TODAY  08-09-22: wbc 9.7; hgb 14.3; hct 43.2; mcv 89.6 plt 188; glucose 133; bun 26; creat 0.98; k+ 3.5; na++ 138; ca 8.7; gfr 55; ddimer: 0.95 crp 3.8 08-13-22: TSH 0.179; vitamin D 35.44    Review of Systems  Constitutional:  Negative for malaise/fatigue.  Respiratory:  Negative for cough and shortness of breath.   Cardiovascular:  Negative for chest pain, palpitations and leg swelling.  Gastrointestinal:  Negative for  abdominal pain, constipation and heartburn.  Musculoskeletal:  Negative for back pain, joint pain and myalgias.  Skin: Negative.   Neurological:  Negative for dizziness.  Psychiatric/Behavioral:  The patient is not nervous/anxious.     Physical Exam Constitutional:      General: She is not in acute distress.    Appearance: She is well-developed. She is not diaphoretic.  Neck:     Thyroid: No thyromegaly.  Cardiovascular:     Rate and Rhythm: Normal rate and regular rhythm.     Pulses: Normal pulses.     Heart sounds: Normal heart sounds.  Pulmonary:     Effort: Pulmonary effort is normal. No respiratory distress.     Breath sounds: Normal breath sounds.  Abdominal:     General: Bowel sounds are normal. There is no distension.     Palpations: Abdomen is soft.     Tenderness: There is no abdominal tenderness.  Musculoskeletal:     Cervical back: Neck supple.     Right lower leg: No edema.     Left lower leg: No edema.     Comments: Left hemiplegia   Lymphadenopathy:     Cervical: No cervical adenopathy.  Skin:    General: Skin is warm and dry.  Neurological:     Mental Status: She is alert. Mental status is at baseline.  Psychiatric:        Mood and Affect: Mood normal.      ASSESSMENT/ PLAN:  TODAY  Postoperative hypothyroidism: is worse; will change synthroid to 50 mcg daily will check tsh 09-13-22   Ok Edwards NP [facility]  call [phone]

## 2022-08-14 DIAGNOSIS — I63211 Cerebral infarction due to unspecified occlusion or stenosis of right vertebral arteries: Secondary | ICD-10-CM | POA: Diagnosis not present

## 2022-08-14 DIAGNOSIS — Z741 Need for assistance with personal care: Secondary | ICD-10-CM | POA: Diagnosis not present

## 2022-08-14 DIAGNOSIS — R279 Unspecified lack of coordination: Secondary | ICD-10-CM | POA: Diagnosis not present

## 2022-08-14 DIAGNOSIS — I69354 Hemiplegia and hemiparesis following cerebral infarction affecting left non-dominant side: Secondary | ICD-10-CM | POA: Diagnosis not present

## 2022-08-14 DIAGNOSIS — F0390 Unspecified dementia without behavioral disturbance: Secondary | ICD-10-CM | POA: Diagnosis not present

## 2022-08-14 DIAGNOSIS — R768 Other specified abnormal immunological findings in serum: Secondary | ICD-10-CM | POA: Insufficient documentation

## 2022-08-14 DIAGNOSIS — M6281 Muscle weakness (generalized): Secondary | ICD-10-CM | POA: Diagnosis not present

## 2022-08-15 DIAGNOSIS — U071 COVID-19: Secondary | ICD-10-CM | POA: Insufficient documentation

## 2022-08-15 DIAGNOSIS — J1282 Pneumonia due to coronavirus disease 2019: Secondary | ICD-10-CM | POA: Insufficient documentation

## 2022-08-15 DIAGNOSIS — R7982 Elevated C-reactive protein (CRP): Secondary | ICD-10-CM | POA: Insufficient documentation

## 2022-08-16 DIAGNOSIS — R279 Unspecified lack of coordination: Secondary | ICD-10-CM | POA: Diagnosis not present

## 2022-08-16 DIAGNOSIS — F0390 Unspecified dementia without behavioral disturbance: Secondary | ICD-10-CM | POA: Diagnosis not present

## 2022-08-16 DIAGNOSIS — I63211 Cerebral infarction due to unspecified occlusion or stenosis of right vertebral arteries: Secondary | ICD-10-CM | POA: Diagnosis not present

## 2022-08-16 DIAGNOSIS — I69354 Hemiplegia and hemiparesis following cerebral infarction affecting left non-dominant side: Secondary | ICD-10-CM | POA: Diagnosis not present

## 2022-08-16 DIAGNOSIS — Z741 Need for assistance with personal care: Secondary | ICD-10-CM | POA: Diagnosis not present

## 2022-08-16 DIAGNOSIS — M6281 Muscle weakness (generalized): Secondary | ICD-10-CM | POA: Diagnosis not present

## 2022-08-17 DIAGNOSIS — R279 Unspecified lack of coordination: Secondary | ICD-10-CM | POA: Diagnosis not present

## 2022-08-17 DIAGNOSIS — F0390 Unspecified dementia without behavioral disturbance: Secondary | ICD-10-CM | POA: Diagnosis not present

## 2022-08-17 DIAGNOSIS — M6281 Muscle weakness (generalized): Secondary | ICD-10-CM | POA: Diagnosis not present

## 2022-08-17 DIAGNOSIS — I63211 Cerebral infarction due to unspecified occlusion or stenosis of right vertebral arteries: Secondary | ICD-10-CM | POA: Diagnosis not present

## 2022-08-17 DIAGNOSIS — Z741 Need for assistance with personal care: Secondary | ICD-10-CM | POA: Diagnosis not present

## 2022-08-17 DIAGNOSIS — I69354 Hemiplegia and hemiparesis following cerebral infarction affecting left non-dominant side: Secondary | ICD-10-CM | POA: Diagnosis not present

## 2022-08-20 DIAGNOSIS — R279 Unspecified lack of coordination: Secondary | ICD-10-CM | POA: Diagnosis not present

## 2022-08-20 DIAGNOSIS — Z741 Need for assistance with personal care: Secondary | ICD-10-CM | POA: Diagnosis not present

## 2022-08-20 DIAGNOSIS — I63211 Cerebral infarction due to unspecified occlusion or stenosis of right vertebral arteries: Secondary | ICD-10-CM | POA: Diagnosis not present

## 2022-08-20 DIAGNOSIS — F0390 Unspecified dementia without behavioral disturbance: Secondary | ICD-10-CM | POA: Diagnosis not present

## 2022-08-20 DIAGNOSIS — I69354 Hemiplegia and hemiparesis following cerebral infarction affecting left non-dominant side: Secondary | ICD-10-CM | POA: Diagnosis not present

## 2022-08-20 DIAGNOSIS — M6281 Muscle weakness (generalized): Secondary | ICD-10-CM | POA: Diagnosis not present

## 2022-08-22 ENCOUNTER — Encounter: Payer: Self-pay | Admitting: Adult Health

## 2022-08-22 ENCOUNTER — Non-Acute Institutional Stay (SKILLED_NURSING_FACILITY): Payer: Medicare Other | Admitting: Adult Health

## 2022-08-22 DIAGNOSIS — Z0000 Encounter for general adult medical examination without abnormal findings: Secondary | ICD-10-CM

## 2022-08-22 NOTE — Patient Instructions (Signed)
   Ms. Mulhall , Thank you for taking time to come for your Medicare Wellness Visit. I appreciate your ongoing commitment to your health goals. Please review the following plan we discussed and let me know if I can assist you in the future.   These are the goals we discussed:  Goals      Absence of Fall and Fall-Related Injury     Evidence-based guidance:  Assess fall risk using a validated tool when available. Consider balance and gait impairment, muscle weakness, diminished vision or hearing, environmental hazards, presence of urinary or bowel urgency and/or incontinence.  Communicate fall injury risk to interprofessional healthcare team.  Develop a fall prevention plan with the patient and family.  Promote use of personal vision and auditory aids.  Promote reorientation, appropriate sensory stimulation, and routines to decrease risk of fall when changes in mental status are present.  Assess assistance level required for safe and effective self-care; consider referral for home care.  Encourage physical activity, such as performance of self-care at highest level of ability, strength and balance exercise program, and provision of appropriate assistive devices; refer to rehabilitation therapy.  Refer to community-based fall prevention program where available.  If fall occurs, determine the cause and revise fall injury prevention plan.  Regularly review medication contribution to fall risk; consider risk related to polypharmacy and age.  Refer to pharmacist for consultation when concerns about medications are revealed.  Balance adequate pain management with potential for oversedation.  Provide guidance related to environmental modifications.  Consider supplementation with Vitamin D.   Notes:      Follow up with Primary Care Provider     General - Client will not be readmitted within 30 days ([facility])        This is a list of the screening recommended for you and due dates:  Health  Maintenance  Topic Date Due   DEXA scan (bone density measurement)  Never done   COVID-19 Vaccine (4 - Moderna series) 07/14/2022   Medicare Annual Wellness Visit  08/23/2023   Tetanus Vaccine  08/24/2031   Pneumonia Vaccine  Completed   Flu Shot  Completed   Zoster (Shingles) Vaccine  Completed   HPV Vaccine  Aged Out

## 2022-08-22 NOTE — Progress Notes (Signed)
Subjective:   Vickie Mckee is a 88 y.o. female who presents for Medicare Annual (Subsequent) preventive examination.  Review of Systems    Review of Systems  Constitutional:  Negative for malaise/fatigue.  Respiratory:  Negative for cough and shortness of breath.   Cardiovascular:  Negative for chest pain, palpitations and leg swelling.  Gastrointestinal:  Negative for abdominal pain, constipation and heartburn.  Musculoskeletal:  Negative for back pain, joint pain and myalgias.  Skin: Negative.   Neurological:  Negative for dizziness.  Psychiatric/Behavioral:  The patient is not nervous/anxious.     Cardiac Risk Factors include: sedentary lifestyle;advanced age (>71mn, >>68women)     Objective:    Today's Vitals   08/22/22 1204 08/22/22 1450  BP: 116/68   Pulse: 80   Resp: 20   Temp: (!) 97 F (36.1 C)   SpO2: 95%   Weight: 135 lb 12.8 oz (61.6 kg)   Height: '5\' 2"'$  (1.575 m)   PainSc:  0-No pain   Body mass index is 24.84 kg/m.     08/22/2022   12:08 PM 08/10/2022    2:08 PM 08/09/2022    2:38 PM 06/04/2022    8:52 AM 05/09/2022    2:54 PM 05/03/2022   10:44 AM 04/13/2022    9:32 AM  Advanced Directives  Does Patient Have a Medical Advance Directive? Yes Yes Yes Yes Yes Yes Yes  Type of Advance Directive Out of facility DNR (pink MOST or yellow form)   Out of facility DNR (pink MOST or yellow form) Out of facility DNR (pink MOST or yellow form) Out of facility DNR (pink MOST or yellow form) Out of facility DNR (pink MOST or yellow form)  Does patient want to make changes to medical advance directive? No - Patient declined No - Patient declined No - Patient declined No - Patient declined No - Patient declined No - Patient declined No - Patient declined  Would patient like information on creating a medical advance directive?  No - Patient declined  No - Patient declined     Pre-existing out of facility DNR order (yellow form or pink MOST form) Pink MOST form placed  in chart (order not valid for inpatient use) Pink MOST form placed in chart (order not valid for inpatient use) Pink MOST form placed in chart (order not valid for inpatient use) Pink MOST form placed in chart (order not valid for inpatient use) Pink MOST form placed in chart (order not valid for inpatient use) Pink MOST form placed in chart (order not valid for inpatient use) Pink MOST form placed in chart (order not valid for inpatient use)    Current Medications (verified) Outpatient Encounter Medications as of 08/22/2022  Medication Sig   acetaminophen (TYLENOL) 325 MG tablet Take 650 mg by mouth every 6 (six) hours.   amantadine (SYMMETREL) 50 MG/5ML solution Take 50 mg by mouth 2 (two) times daily. With breakfast and lunch   Amino Acids-Protein Hydrolys (FEEDING SUPPLEMENT, PRO-STAT SUGAR FREE 64,) LIQD Take 30 mLs by mouth 3 (three) times daily with meals. for albumin 2.8  15-100 gram-kcal/30 mL; amt: 60 cc; oral Special Instructions: for albumin of 2.8 with meal Once A Day   aspirin EC 81 MG EC tablet Take 1 tablet (81 mg total) by mouth daily with breakfast. Swallow whole.   Cholecalciferol (VITAMIN D-3) 25 MCG (1000 UT) CAPS Take 1 capsule by mouth daily.   donepezil (ARICEPT) 5 MG tablet Take 1 tablet (5 mg  total) by mouth at bedtime.   famotidine (PEPCID) 20 MG tablet Take 1 tablet (20 mg total) by mouth daily.   levothyroxine (SYNTHROID) 50 MCG tablet Take by mouth daily before breakfast.   memantine (NAMENDA XR) 28 MG CP24 24 hr capsule Take 28 mg by mouth daily.   Menthol, Topical Analgesic, (BIOFREEZE) 4 % GEL Apply topically as needed. Left shoulder pain   NON FORMULARY Diet:  Regular, thin liquids- Add prune juice to Breakfast tray   oxymetazoline (AFRIN) 0.05 % nasal spray Place 1 spray into both nostrils as needed. epistaxis   polyethylene glycol (MIRALAX / GLYCOLAX) 17 g packet Take 17 g by mouth daily.   sertraline (ZOLOFT) 25 MG tablet Take 25 mg by mouth daily.  to be  given with '50mg'$  to = '75mg'$    sertraline (ZOLOFT) 50 MG tablet Take 50 mg by mouth daily. to be given with '25mg'$  to = '75mg'$    sodium chloride (OCEAN) 0.65 % SOLN nasal spray Place 1 spray into both nostrils 3 (three) times daily. And as needed   vitamin C (ASCORBIC ACID) 500 MG tablet Take 500 mg by mouth daily.   Vitamin D, Ergocalciferol, (DRISDOL) 1.25 MG (50000 UNIT) CAPS capsule Take 50,000 Units by mouth every 7 (seven) days.   Zinc Acetate-Sweetleaf (FP ZINC LOZENGES PO) Take by mouth. 5 times per day for 2 weeks   No facility-administered encounter medications on file as of 08/22/2022.    Allergies (verified) Hydrochlorothiazide and Codeine   History: Past Medical History:  Diagnosis Date   Decreased sense of smell    and taste-negative CT scan-2011   Depression    Essential hypertension    GERD (gastroesophageal reflux disease)    Heart murmur    heard first time 4/12   History of mammogram    2010 and she does not want to repeat them anymore   HOCM (hypertrophic obstructive cardiomyopathy) ([place])    a. Dx by echo 05/2014.   Hypothyroidism    IBS (irritable bowel syndrome)    Impaired fasting glucose    Low back pain    Lumbar radiculopathy    with negative MRI (1991)   Postmenopausal    with osteopenia, bisphosphonates 1/05-1/11, improved osteopenia 4/12-repeat planned late 2015   Shingles    2015   Thyroid nodule    Vitamin D deficiency    repleted on weekly vitamin D 50000 iu   Past Surgical History:  Procedure Laterality Date   ABDOMINAL HYSTERECTOMY     BREAST BIOPSY     x2   CATARACT EXTRACTION     removal with IOL bilateral   GLAUCOMA SURGERY     laser   skin cancer removal     THYROIDECTOMY     subtotal   Family History  Problem Relation Age of Onset   Breast cancer Mother    Hypertension Father    CVA Father    Heart failure Sister    CAD Sister    Kidney disease Sister    COPD Brother    Lung cancer Brother    CAD Brother    Diabetes  Brother    Stroke Father    Heart attack Neg Hx    Social History   Socioeconomic History   Marital status: Married    Spouse name: Not on file   Number of children: Not on file   Years of education: Not on file   Highest education level: Not on file  Occupational History  Not on file  Tobacco Use   Smoking status: Never   Smokeless tobacco: Never  Vaping Use   Vaping Use: Never used  Substance and Sexual Activity   Alcohol use: No    Alcohol/week: 0.0 standard drinks of alcohol   Drug use: Never   Sexual activity: Not on file  Other Topics Concern   Not on file  Social History Narrative   Not on file   Social Determinants of Health   Financial Resource Strain: Not on file  Food Insecurity: Not on file  Transportation Needs: Not on file  Physical Activity: Not on file  Stress: Not on file  Social Connections: Not on file    Tobacco Counseling Counseling given: Not Answered   Clinical Intake:  Pre-visit preparation completed: Yes  Pain : No/denies pain Pain Score: 0-No pain Faces Pain Scale: No hurt  Faces Pain Scale: No hurt  BMI - recorded: 24.84 Nutritional Status: BMI of 19-24  Normal Nutritional Risks: Unintentional weight loss Diabetes: No  How often do you need to have someone help you when you read instructions, pamphlets, or other written materials from your doctor or pharmacy?: [place] Needed?: No      Activities of Daily Living    08/22/2022    2:49 PM  In your present state of health, do you have any difficulty performing the following activities:  Hearing? 0  Vision? 0  Difficulty concentrating or making decisions? 1  Walking or climbing stairs? 1  Dressing or bathing? 1  Doing errands, shopping? 1  Preparing Food and eating ? Y  Using the Toilet? Y  In the past six months, have you accidently leaked urine? Y  Do you have problems with loss of bowel control? Y  Managing your Medications? Y   Managing your Finances? Y  Housekeeping or managing your Housekeeping? Y    Patient Care Team: Gerlene Fee, NP as PCP - General (Geriatric Medicine)  Indicate any recent Medical Services you may have received from other than [facility] providers in the past year (date may be approximate).     Assessment:   This is a routine wellness examination for [place].  Hearing/Vision screen No results found.  Dietary issues and exercise activities discussed: Current Exercise Habits: The patient does not participate in regular exercise at present, Exercise limited by: None identified   Goals Addressed             This Visit's Progress    Absence of Fall and Fall-Related Injury   On track    Evidence-based guidance:  Assess fall risk using a validated tool when available. Consider balance and gait impairment, muscle weakness, diminished vision or hearing, environmental hazards, presence of urinary or bowel urgency and/or incontinence.  Communicate fall injury risk to interprofessional healthcare team.  Develop a fall prevention plan with the patient and family.  Promote use of personal vision and auditory aids.  Promote reorientation, appropriate sensory stimulation, and routines to decrease risk of fall when changes in mental status are present.  Assess assistance level required for safe and effective self-care; consider referral for home care.  Encourage physical activity, such as performance of self-care at highest level of ability, strength and balance exercise program, and provision of appropriate assistive devices; refer to rehabilitation therapy.  Refer to community-based fall prevention program where available.  If fall occurs, determine the cause and revise fall injury prevention plan.  Regularly review medication contribution to fall risk;  consider risk related to polypharmacy and age.  Refer to pharmacist for consultation when concerns about medications are revealed.  Balance  adequate pain management with potential for oversedation.  Provide guidance related to environmental modifications.  Consider supplementation with Vitamin D.   Notes:      Follow up with Primary Care Provider   On track    General - Client will not be readmitted within 30 days ([facility])   On track      Depression Screen    08/22/2022    2:48 PM 02/08/2022    2:06 PM 11/02/2021    1:01 PM 10/24/2021    1:07 PM 10/20/2021    3:40 PM 08/24/2021   12:10 PM 08/21/2021    1:27 PM  PHQ 2/9 Scores  PHQ - 2 Score 0 0 0 0 0 0 0  PHQ- 9 Score 0 0 0   0     Fall Risk    08/22/2022    2:48 PM 08/10/2022    2:10 PM 06/04/2022    8:53 AM 02/08/2022    2:07 PM 11/02/2021    1:01 PM  Fall Risk   Falls in the past year? '1 1 1 1 1  '$ Number falls in past yr: 0 '1 1 1 1  '$ Injury with Fall? 0 1 1 0 0  Risk for fall due to : History of fall(s);Impaired balance/gait;Impaired mobility History of fall(s);Impaired balance/gait;Impaired mobility History of fall(s) History of fall(s);Impaired balance/gait;Impaired mobility History of fall(s);Impaired balance/gait  Follow up  Falls evaluation completed Falls evaluation completed  Falls evaluation completed    FALL RISK PREVENTION PERTAINING TO THE HOME:  Any stairs in or around the home? Yes  If so, are there any without handrails? No  Home free of loose throw rugs in walkways, pet beds, electrical cords, etc? Yes  Adequate lighting in your home to reduce risk of falls? Yes   ASSISTIVE DEVICES UTILIZED TO PREVENT FALLS:  Life alert? No  Use of a cane, walker or w/c? Yes  Grab bars in the bathroom? Yes  Shower chair or bench in shower? Yes  Elevated toilet seat or a handicapped toilet? Yes   TIMED UP AND GO:  Was the test performed? No .  Length of time to ambulate is nonambulatory     Cognitive Function:    08/22/2022    2:49 PM 08/21/2021    1:27 PM  MMSE - Mini Mental State Exam  Not completed: Unable to complete Unable to complete         Immunizations Immunization History  Administered Date(s) Administered   Fluad Quad(high Dose 65+) 07/26/2021   Influenza-Unspecified 07/20/2020, 07/25/2022   Moderna Covid-19 Vaccine Bivalent Booster 50yr & up 08/15/2021   Moderna SARS-COV2 Booster Vaccination 03/13/2022   Moderna Sars-Covid-2 Vaccination 02/10/2020, 03/08/2020   PNEUMOCOCCAL CONJUGATE-20 04/06/2022   Pneumococcal Conjugate-13 03/09/2014   Pneumococcal Polysaccharide-23 09/11/2004   Tdap 08/23/2021   Tetanus 01/03/2009   Zoster Recombinat (Shingrix) 11/14/2021, 02/16/2022    TDAP status: Up to date  Flu Vaccine status: Up to date  Pneumococcal vaccine status: Up to date  Covid-19 vaccine status: Completed vaccines  Qualifies for Shingles Vaccine? Yes   Zostavax completed Yes   Shingrix Completed?: Yes  Screening Tests Health Maintenance  Topic Date Due   DEXA SCAN  Never done   COVID-19 Vaccine (4 - Moderna series) 07/14/2022   Medicare Annual Wellness (AWV)  08/21/2022   TETANUS/TDAP  08/24/2031   Pneumonia Vaccine 65+ Years  old  Completed   INFLUENZA VACCINE  Completed   Zoster Vaccines- Shingrix  Completed   HPV VACCINES  Aged Out    Health Maintenance  Health Maintenance Due  Topic Date Due   DEXA SCAN  Never done   COVID-19 Vaccine (4 - Moderna series) 07/14/2022   Medicare Annual Wellness (AWV)  08/21/2022    Colorectal cancer screening: No longer required.   Mammogram status: No longer required due to age.  Bone Density status: Completed 06-06-22. Results reflect: Bone density results: OSTEOPOROSIS. Repeat every 2 years.  Lung Cancer Screening: (Low Dose CT Chest recommended if Age 79-80 years, 30 pack-year currently smoking OR have quit w/in 15years.) does not qualify.   Lung Cancer Screening Referral: no  Additional Screening:  Hepatitis C Screening: does not qualify; Completed   Vision Screening: Recommended annual ophthalmology exams for early detection of glaucoma and  other disorders of the eye. Is the patient up to date with their annual eye exam?  Yes  Who is the provider or what is the name of the office in which the patient attends annual eye exams?  If pt is not established with a provider, would they like to be referred to a provider to establish care? No .   Dental Screening: Recommended annual dental exams for proper oral hygiene  Community Resource Referral / Chronic Care Management: CRR required this visit?  No   CCM required this visit?  No      Plan:     I have personally reviewed and noted the following in the patient's chart:   Medical and social history Use of alcohol, tobacco or illicit drugs  Current medications and supplements including opioid prescriptions. Patient is not currently taking opioid prescriptions. Functional ability and status Nutritional status Physical activity Advanced directives List of other physicians Hospitalizations, surgeries, and ER visits in previous 12 months Vitals Screenings to include cognitive, depression, and falls Referrals and appointments  In addition, I have reviewed and discussed with patient certain preventive protocols, quality metrics, and best practice recommendations. A written personalized care plan for preventive services as well as general preventive health recommendations were provided to patient.     Gerlene Fee, NP   08/22/2022   Nurse Notes: this exam was performed at this facility by myself

## 2022-08-28 DIAGNOSIS — Z23 Encounter for immunization: Secondary | ICD-10-CM | POA: Diagnosis not present

## 2022-09-12 ENCOUNTER — Non-Acute Institutional Stay (SKILLED_NURSING_FACILITY): Payer: Medicare Other | Admitting: Adult Health

## 2022-09-12 DIAGNOSIS — E890 Postprocedural hypothyroidism: Secondary | ICD-10-CM

## 2022-09-12 DIAGNOSIS — E46 Unspecified protein-calorie malnutrition: Secondary | ICD-10-CM | POA: Diagnosis not present

## 2022-09-12 DIAGNOSIS — E8809 Other disorders of plasma-protein metabolism, not elsewhere classified: Secondary | ICD-10-CM | POA: Diagnosis not present

## 2022-09-12 DIAGNOSIS — E538 Deficiency of other specified B group vitamins: Secondary | ICD-10-CM | POA: Diagnosis not present

## 2022-09-12 DIAGNOSIS — F0150 Vascular dementia without behavioral disturbance: Secondary | ICD-10-CM

## 2022-09-13 ENCOUNTER — Other Ambulatory Visit (HOSPITAL_COMMUNITY)
Admission: RE | Admit: 2022-09-13 | Discharge: 2022-09-13 | Disposition: A | Discharge status: Home / Self Care | Payer: Medicare Other | Source: Skilled Nursing Facility | Attending: Adult Health | Admitting: Adult Health

## 2022-09-13 DIAGNOSIS — E039 Hypothyroidism, unspecified: Secondary | ICD-10-CM | POA: Diagnosis not present

## 2022-09-13 LAB — TSH: TSH: 2.261 u[IU]/mL (ref 0.350–4.500)

## 2022-09-13 LAB — T4, FREE: Free T4: 0.95 ng/dL (ref 0.61–1.12)

## 2022-09-17 ENCOUNTER — Encounter: Payer: Self-pay | Admitting: Adult Health

## 2022-09-17 NOTE — Progress Notes (Unsigned)
Location:  [facility] Room Number: 138 Place of Service:  SNF (31)   CODE STATUS: dnr   Allergies  Allergen Reactions   Hydrochlorothiazide     05/29/2021 K+ 2.6 Other reaction(s): Other (See Comments) 05/29/2021 K+ 2.6   Codeine Nausea Only    Chief Complaint  Patient presents with   Medical Management of Chronic Issues                        Vascular dementia without behavioral disturbance: Hypoalbuminemia due to protein calorie malnutrition;  Vitamin B 12: Postoperative hypothyroidism:    HPI:  Vickie Mckee is a 88 year old long term resident of this facility being seen for the management of her chronic illnesses:  Vascular dementia without behavioral disturbance: Hypoalbuminemia due to protein calorie malnutrition;  Vitamin B 12: Postoperative hypothyroidism. There are no reports of uncontrolled pain. Her weight is stable; her appetite is stable.   Past Medical History:  Diagnosis Date   Decreased sense of smell    and taste-negative CT scan-2011   Depression    Essential hypertension    GERD (gastroesophageal reflux disease)    Heart murmur    heard first time 4/12   History of mammogram    2010 and Vickie Mckee does not want to repeat them anymore   HOCM (hypertrophic obstructive cardiomyopathy) ([place])    a. Dx by echo 05/2014.   Hypothyroidism    IBS (irritable bowel syndrome)    Impaired fasting glucose    Low back pain    Lumbar radiculopathy    with negative MRI (1991)   Postmenopausal    with osteopenia, bisphosphonates 1/05-1/11, improved osteopenia 4/12-repeat planned late 2015   Shingles    2015   Thyroid nodule    Vitamin D deficiency    repleted on weekly vitamin D 50000 iu    Past Surgical History:  Procedure Laterality Date   ABDOMINAL HYSTERECTOMY     BREAST BIOPSY     x2   CATARACT EXTRACTION     removal with IOL bilateral   GLAUCOMA SURGERY     laser   skin cancer removal     THYROIDECTOMY     subtotal    Social History    Socioeconomic History   Marital status: Married    Spouse name: Not on file   Number of children: Not on file   Years of education: Not on file   Highest education level: Not on file  Occupational History   Not on file  Tobacco Use   Smoking status: Never   Smokeless tobacco: Never  Vaping Use   Vaping Use: Never used  Substance and Sexual Activity   Alcohol use: No    Alcohol/week: 0.0 standard drinks of alcohol   Drug use: Never   Sexual activity: Not on file  Other Topics Concern   Not on file  Social History Narrative   Not on file   Social Determinants of Health   Financial Resource Strain: Not on file  Food Insecurity: Not on file  Transportation Needs: Not on file  Physical Activity: Not on file  Stress: Not on file  Social Connections: Not on file  Intimate Partner Violence: Not on file   Family History  Problem Relation Age of Onset   Breast cancer Mother    Hypertension Father    CVA Father    Heart failure Sister    CAD Sister  Kidney disease Sister    COPD Brother    Lung cancer Brother    CAD Brother    Diabetes Brother    Stroke Father    Heart attack Neg Hx       VITAL SIGNS BP (!) 147/90   Pulse 88   Temp 98 F (36.7 C)   Resp 18   Ht '5\' 2"'$  (1.575 m)   Wt 135 lb 9.6 oz (61.5 kg)   SpO2 96%   BMI 24.80 kg/m   Outpatient Encounter Medications as of 09/12/2022  Medication Sig   acetaminophen (TYLENOL) 325 MG tablet Take 650 mg by mouth every 6 (six) hours.   amantadine (SYMMETREL) 50 MG/5ML solution Take 50 mg by mouth 2 (two) times daily. With breakfast and lunch   Amino Acids-Protein Hydrolys (FEEDING SUPPLEMENT, PRO-STAT SUGAR FREE 64,) LIQD Take 30 mLs by mouth 3 (three) times daily with meals. for albumin 2.8  15-100 gram-kcal/30 mL; amt: 60 cc; oral Special Instructions: for albumin of 2.8 with meal Once A Day   aspirin EC 81 MG EC tablet Take 1 tablet (81 mg total) by mouth daily with breakfast. Swallow whole.    Cholecalciferol (VITAMIN D-3) 25 MCG (1000 UT) CAPS Take 1 capsule by mouth daily.   donepezil (ARICEPT) 5 MG tablet Take 1 tablet (5 mg total) by mouth at bedtime.   famotidine (PEPCID) 20 MG tablet Take 1 tablet (20 mg total) by mouth daily.   levothyroxine (SYNTHROID) 50 MCG tablet Take by mouth daily before breakfast.   memantine (NAMENDA XR) 28 MG CP24 24 hr capsule Take 28 mg by mouth daily.   Menthol, Topical Analgesic, (BIOFREEZE) 4 % GEL Apply topically as needed. Left shoulder pain   NON FORMULARY Diet:  Regular, thin liquids- Add prune juice to Breakfast tray   oxymetazoline (AFRIN) 0.05 % nasal spray Place 1 spray into both nostrils as needed. epistaxis   polyethylene glycol (MIRALAX / GLYCOLAX) 17 g packet Take 17 g by mouth daily.   sertraline (ZOLOFT) 25 MG tablet Take 25 mg by mouth daily.  to be given with '50mg'$  to = '75mg'$    sertraline (ZOLOFT) 50 MG tablet Take 50 mg by mouth daily. to be given with '25mg'$  to = '75mg'$    sodium chloride (OCEAN) 0.65 % SOLN nasal spray Place 1 spray into both nostrils 3 (three) times daily. And as needed   vitamin C (ASCORBIC ACID) 500 MG tablet Take 500 mg by mouth daily.   Vitamin D, Ergocalciferol, (DRISDOL) 1.25 MG (50000 UNIT) CAPS capsule Take 50,000 Units by mouth every 7 (seven) days.   Zinc Acetate-Sweetleaf (FP ZINC LOZENGES PO) Take by mouth. 5 times per day for 2 weeks   No facility-administered encounter medications on file as of 09/12/2022.     SIGNIFICANT DIAGNOSTIC EXAMS   PREVIOUS   08-09-22: chest x-ray: Mild prominent interstitial densities bilateral compatible with pneumonitis. Mild cardiomegaly; mild osteopenia; mild osteoarthritis.   NO NEW EXAMS   LABS REVIEWED PREVIOUS   07-28-21: wbc 9.5; hgb 12.8; hct 38.4; mcv 993.7 plt 237; glucose 102; bun 25; creat 0.81; k+ 3.3; na++ 139; ca 8.2 GFR>60; liver normal albumin 3.2; tsh 2.457 vit B 12: 261; folate 9. 09-07-21 k+ 3.5 02-12-22: wbc 7.8; hgb 14.8; hct 44.7; mcv 89.2  plt 221; glucose 180; bun 24; creat 0.89; k+ 4.0; na++ 141; ca 9.0; GFR 56; protein 6.6; albumin 3.6; hgb a1c 5.5; tsh 2.305 free t4: 0.94; vitamin B12: 2092; vitamin B6: 72.2 vitamin  D 24.4   08-09-22: wbc 9.7; hgb 14.3; hct 43.2; mcv 89.6 plt 188; glucose 133; bun 26; creat 0.98; k+ 3.5; na++ 138; ca 8.7; gfr 55; ddimer: 0.95 crp 3.8 08-13-22: TSH 0.179; vitamin D 35.44   NO NEW LABS.    Review of Systems  Constitutional:  Negative for malaise/fatigue.  Respiratory:  Negative for cough and shortness of breath.   Cardiovascular:  Negative for chest pain, palpitations and leg swelling.  Gastrointestinal:  Negative for abdominal pain, constipation and heartburn.  Musculoskeletal:  Negative for back pain, joint pain and myalgias.  Skin: Negative.   Neurological:  Negative for dizziness.  Psychiatric/Behavioral:  The patient is not nervous/anxious.       Physical Exam Constitutional:      General: Vickie Mckee is not in acute distress.    Appearance: Vickie Mckee is well-developed. Vickie Mckee is not diaphoretic.  Neck:     Thyroid: No thyromegaly.  Cardiovascular:     Rate and Rhythm: Normal rate and regular rhythm.     Pulses: Normal pulses.     Heart sounds: Normal heart sounds.  Pulmonary:     Effort: Pulmonary effort is normal. No respiratory distress.     Breath sounds: Normal breath sounds.  Abdominal:     General: Bowel sounds are normal. There is no distension.     Palpations: Abdomen is soft.     Tenderness: There is no abdominal tenderness.  Musculoskeletal:     Cervical back: Neck supple.     Right lower leg: No edema.     Left lower leg: No edema.     Comments: Left hemiplegia   Lymphadenopathy:     Cervical: No cervical adenopathy.  Skin:    General: Skin is warm and dry.  Neurological:     Mental Status: Vickie Mckee is alert. Mental status is at baseline.  Psychiatric:        Mood and Affect: Mood normal.      ASSESSMENT/ PLAN:  TODAY  Vascular dementia without behavioral  disturbance: weight is 135 pounds; will continue aricept 5 mg nightly and namenda xr 28 mg daily   2. Hypoalbuminemia due to protein calorie malnutrition; albumin 3.4 will monitor    3. Vitamin B 12: 2092 is off supplement  4. Postoperative hypothyroidism:   tsh 0.179 will continue synthroid 50 mcg daily   PREVIOUS   5. Vitamin D deficiency: is 24.4 is on 1000 units daily   6. Mixed hyperlipidemia: is off statin due to her advanced age  60. Psychosis in elderly without behavorial disturbance: is off seroquel  8. Depression recurrent will continue zoloft 75 mg daily   9. GERD without esophagitis: will continue pepcid 20 mg daily  10. Chronic constipation: will continue miralax every other day  11. Thoracic aortic atherosclerosis: (cxr 12-15-14) not on statin due to her advanced age  64. History of stroke/left hemiplegia: is on asa 81 mg daily   13. Somnolence post stroke: is on amantadine 50 mg twice daily   14. HOCM (hypertrophic obstructive cardiomyopathy) will monitor  15. Dysphagia: no indications of aspiration; is on thin liquids  16. Stage 3a chronic kidney disease: bun 24; creat 0.8; gfr >60  17. Essential hypertension: 147/90: at this time will not make changes; if her readings remain elevated will make changes at that time.    18. Interstitial lung disease: is stable.    Ok Edwards NP [facility]   call [phone]

## 2022-10-01 ENCOUNTER — Non-Acute Institutional Stay (SKILLED_NURSING_FACILITY): Payer: Medicare Other | Admitting: Internal Medicine

## 2022-10-01 ENCOUNTER — Encounter: Payer: Self-pay | Admitting: Internal Medicine

## 2022-10-01 DIAGNOSIS — E559 Vitamin D deficiency, unspecified: Secondary | ICD-10-CM

## 2022-10-01 DIAGNOSIS — F0150 Vascular dementia without behavioral disturbance: Secondary | ICD-10-CM

## 2022-10-01 DIAGNOSIS — E890 Postprocedural hypothyroidism: Secondary | ICD-10-CM

## 2022-10-01 DIAGNOSIS — I10 Essential (primary) hypertension: Secondary | ICD-10-CM

## 2022-10-01 DIAGNOSIS — E538 Deficiency of other specified B group vitamins: Secondary | ICD-10-CM | POA: Diagnosis not present

## 2022-10-01 DIAGNOSIS — N1831 Chronic kidney disease, stage 3a: Secondary | ICD-10-CM | POA: Diagnosis not present

## 2022-10-01 NOTE — Progress Notes (Signed)
NURSING HOME LOCATION:  [facility] ROOM NUMBER: 138 P  CODE STATUS: DNR  PCP:  Ok Edwards NP  This is a nursing facility follow up visit of chronic medical diagnoses & to document compliance with Regulation 483.30 (c) in The [place] Manual Phase 2 which mandates caregiver visit ( visits can alternate among physician, PA or NP as per statutes) within 10 days of 30 days / 60 days/ 90 days post admission to SNF date    Interim medical record and care since last SNF visit was updated with review of diagnostic studies and change in clinical status since last visit were documented.  HPI: She is a permanent resident of this facility with medical diagnoses of essential hypertension, GERD, hypertrophic cardiomyopathy, hypothyroidism, IBS, vitamin D deficiency, and depression. Significant surgeries and procedures include thyroidectomy.  Labs are sufficiently current and reveal mild prerenal azotemia with a BUN of 37.  Creatinine is 0.93 with a GFR of 59 indicating high stage IIIa CKD.  Vitamin D 25-hydroxy level was low normal.  CBC was normal.  Protein/caloric malnutrition is suggested by an albumin of 3.1 and total protein of 6.1.TSH was therapeutic at 2.261 on 09/13/2022.  Review of systems: Dementia invalidated responses.  She confabulated that the tingling in the left upper extremity was related to an injury she sustained as a child having fallen from a pear tree she had climbed.  Daughter was present and validated that her intake has been suboptimal.  They are pursuing replacing a dental cap which may be affecting her intake.  There is also concern about decreased visual acuity on the left related to her stroke.  Apparently, inadvertently she was left off the ophthalmology clinic list.  She describes her vision as "fuzzy."  She denied any other active symptoms.  Constitutional: No fever, significant weight change, fatigue  Eyes: No redness, discharge,  pain ENT/mouth: No nasal congestion,  purulent discharge, earache, change in hearing, sore throat  Cardiovascular: No chest pain, palpitations, paroxysmal nocturnal dyspnea, claudication, edema  Respiratory: No cough, sputum production, hemoptysis, DOE, significant snoring, apnea   Gastrointestinal: No heartburn, dysphagia, abdominal pain, nausea /vomiting, rectal bleeding, melena, change in bowels Genitourinary: No dysuria, hematuria, pyuria, incontinence, nocturia Musculoskeletal: No joint stiffness, joint swelling,  pain Dermatologic: No rash, pruritus, change in appearance of skin Neurologic: No dizziness, headache, syncope, seizures Psychiatric: No significant anxiety, depression, insomnia, anorexia Endocrine: No change in hair/skin/nails, excessive thirst, excessive hunger, excessive urination  Hematologic/lymphatic: No significant bruising, lymphadenopathy, abnormal bleeding  Physical exam:  Pertinent or positive findings: She appears her stated age.  Eyebrows are decreased in density.  There is slight exotropia on the left intermittently.  She is able to count fingers with the left eye.  She has crackles at the right lower lobe.  Abdomen is protuberant.  Trace edema is present at the ankle.  Dorsalis pedis pulses are stronger than posterior tibial pulses.  She has interosseous wasting of the hands.  The right lower extremity is stronger than the left extremity.  There is essentially no range of motion of the left upper extremity and it is in a sling.  General appearance: Adequately nourished; no acute distress, increased work of breathing is present.   Lymphatic: No lymphadenopathy about the head, neck, axilla. Eyes: No conjunctival inflammation or lid edema is present. There is no scleral icterus. Ears:  External ear exam shows no significant lesions or deformities.   Nose:  External nasal examination shows no  deformity or inflammation. Nasal mucosa are pink and moist without lesions,  exudates Oral exam:  Lips and gums are healthy appearing. There is no oropharyngeal erythema or exudate. Neck:  No thyromegaly, masses, tenderness noted.    Heart:  Normal rate and regular rhythm. S1 and S2 normal without gallop, murmur, click, rub .  Lungs:  without wheezes, rhonchi, rubs. Abdomen: Bowel sounds are normal. Abdomen is soft and nontender with no organomegaly, hernias, masses. GU: Deferred  Extremities:  No cyanosis, clubbing Neurologic exam :Balance, Rhomberg, finger to nose testing could not be completed due to clinical state Skin: Warm & dry w/o tenting. No significant lesions or rash.  See summary under each active problem in the Problem List with associated updated therapeutic plan

## 2022-10-01 NOTE — Patient Instructions (Signed)
See assessment and plan under each diagnosis in the problem list and acutely for this visit 

## 2022-10-01 NOTE — Assessment & Plan Note (Signed)
Current B12 level is low normal.  No change indicated.  Monitor annually.

## 2022-10-01 NOTE — Assessment & Plan Note (Signed)
Current vitamin D 25-hydroxy level is low normal; no change indicated.  Continue annual monitor.

## 2022-10-01 NOTE — Assessment & Plan Note (Signed)
TSH is current and therapeutic.  No change indicated.

## 2022-10-01 NOTE — Assessment & Plan Note (Signed)
Current GFR is 59 indicating high stage IIIa CKD.  Med list reviewed; no change in dosages or medications indicated.

## 2022-10-01 NOTE — Assessment & Plan Note (Signed)
She confabulated that the left hemiparesis was related to falling out of a pear tree as a child.  No behavioral issues reported.

## 2022-10-01 NOTE — Assessment & Plan Note (Signed)
BP controlled; no indication for antihypertensive medications

## 2022-10-19 ENCOUNTER — Encounter: Payer: Self-pay | Admitting: Adult Health

## 2022-10-19 ENCOUNTER — Non-Acute Institutional Stay (SKILLED_NURSING_FACILITY): Payer: Medicare Other | Admitting: Adult Health

## 2022-10-19 DIAGNOSIS — G8194 Hemiplegia, unspecified affecting left nondominant side: Secondary | ICD-10-CM | POA: Diagnosis not present

## 2022-10-19 DIAGNOSIS — I700 Atherosclerosis of aorta: Secondary | ICD-10-CM | POA: Diagnosis not present

## 2022-10-19 DIAGNOSIS — J849 Interstitial pulmonary disease, unspecified: Secondary | ICD-10-CM

## 2022-10-19 NOTE — Progress Notes (Signed)
Location:  [facility] Room Number: 138-P Place of Service:  SNF (31)   CODE STATUS: DNR  Allergies  Allergen Reactions   Hydrochlorothiazide     05/29/2021 K+ 2.6 Other reaction(s): Other (See Comments) 05/29/2021 K+ 2.6   Codeine Nausea Only    Chief Complaint  Patient presents with   Care Plan Meeting    HPI:  We have come together for her care plan meeting. BIMS 6/15 mood 3/30: decreased energy. She is nonambulatory with no falls. She dependent assist with her adls. She is occasionally incontinent of bladder and frequently incontinent of bowel. Dietary: weight is 135 pounds regular diet appetite 25-100%., will stop prostat Therapy: none at this time. Activities: likes word searches. She will continue to be followed for her chronic illnesses including:   Thoracic aortic atherosclerosis  Left hemiplegia   Interstitial lung disease   Past Medical History:  Diagnosis Date   Decreased sense of smell    and taste-negative CT scan-2011   Depression    Essential hypertension    GERD (gastroesophageal reflux disease)    Heart murmur    heard first time 4/12   History of mammogram    2010 and she does not want to repeat them anymore   HOCM (hypertrophic obstructive cardiomyopathy) ([place])    a. Dx by echo 05/2014.   Hypothyroidism    IBS (irritable bowel syndrome)    Impaired fasting glucose    Low back pain    Lumbar radiculopathy    with negative MRI (1991)   Postmenopausal    with osteopenia, bisphosphonates 1/05-1/11, improved osteopenia 4/12-repeat planned late 2015   Shingles    2015   Thyroid nodule    Vitamin D deficiency    repleted on weekly vitamin D 50000 iu    Past Surgical History:  Procedure Laterality Date   ABDOMINAL HYSTERECTOMY     BREAST BIOPSY     x2   CATARACT EXTRACTION     removal with IOL bilateral   GLAUCOMA SURGERY     laser   skin cancer removal     THYROIDECTOMY     subtotal    Social History   Socioeconomic  History   Marital status: Married    Spouse name: Not on file   Number of children: Not on file   Years of education: Not on file   Highest education level: Not on file  Occupational History   Not on file  Tobacco Use   Smoking status: Never   Smokeless tobacco: Never  Vaping Use   Vaping Use: Never used  Substance and Sexual Activity   Alcohol use: No    Alcohol/week: 0.0 standard drinks of alcohol   Drug use: Never   Sexual activity: Not on file  Other Topics Concern   Not on file  Social History Narrative   Not on file   Social Determinants of Health   Financial Resource Strain: Not on file  Food Insecurity: Not on file  Transportation Needs: Not on file  Physical Activity: Not on file  Stress: Not on file  Social Connections: Not on file  Intimate Partner Violence: Not on file   Family History  Problem Relation Age of Onset   Breast cancer Mother    Hypertension Father    CVA Father    Heart failure Sister    CAD Sister    Kidney disease Sister    COPD Brother    Lung cancer Brother  CAD Brother    Diabetes Brother    Stroke Father    Heart attack Neg Hx       VITAL SIGNS BP (!) 145/83   Pulse 80   Temp 97.6 F (36.4 C)   Resp 20   Ht '5\' 2"'$  (1.575 m)   Wt 135 lb (61.2 kg)   SpO2 97%   BMI 24.69 kg/m   Outpatient Encounter Medications as of 10/19/2022  Medication Sig   acetaminophen (TYLENOL) 325 MG tablet Take 650 mg by mouth every 6 (six) hours.   amantadine (SYMMETREL) 50 MG/5ML solution Take 50 mg by mouth 2 (two) times daily. With breakfast and lunch   aspirin EC 81 MG EC tablet Take 1 tablet (81 mg total) by mouth daily with breakfast. Swallow whole.   Cholecalciferol (VITAMIN D-3) 25 MCG (1000 UT) CAPS Take 1 capsule by mouth daily.   donepezil (ARICEPT) 5 MG tablet Take 1 tablet (5 mg total) by mouth at bedtime.   famotidine (PEPCID) 20 MG tablet Take 1 tablet (20 mg total) by mouth daily.   levothyroxine (SYNTHROID) 50 MCG tablet  Take by mouth daily before breakfast.   memantine (NAMENDA XR) 28 MG CP24 24 hr capsule Take 28 mg by mouth daily.   Menthol, Topical Analgesic, (BIOFREEZE) 4 % GEL Apply topically as needed. Left shoulder pain   NON FORMULARY Diet:  Regular, thin liquids- Add prune juice to Breakfast tray   oxymetazoline (AFRIN) 0.05 % nasal spray Place 1 spray into both nostrils as needed. epistaxis   polyethylene glycol (MIRALAX / GLYCOLAX) 17 g packet Take 17 g by mouth daily.   sertraline (ZOLOFT) 25 MG tablet Take 25 mg by mouth daily.  to be given with '50mg'$  to = '75mg'$    sertraline (ZOLOFT) 50 MG tablet Take 50 mg by mouth daily. to be given with '25mg'$  to = '75mg'$    sodium chloride (OCEAN) 0.65 % SOLN nasal spray Place 1 spray into both nostrils 3 (three) times daily. And as needed   vitamin C (ASCORBIC ACID) 500 MG tablet Take 500 mg by mouth daily.   Vitamin D, Ergocalciferol, (DRISDOL) 1.25 MG (50000 UNIT) CAPS capsule Take 50,000 Units by mouth every 7 (seven) days.   [DISCONTINUED] Amino Acids-Protein Hydrolys (FEEDING SUPPLEMENT, PRO-STAT SUGAR FREE 64,) LIQD Take 30 mLs by mouth 3 (three) times daily with meals. for albumin 2.8  15-100 gram-kcal/30 mL; amt: 60 cc; oral Special Instructions: for albumin of 2.8 with meal Once A Day   [DISCONTINUED] Zinc Acetate-Sweetleaf (FP ZINC LOZENGES PO) Take by mouth. 5 times per day for 2 weeks   No facility-administered encounter medications on file as of 10/19/2022.     SIGNIFICANT DIAGNOSTIC EXAMS  PREVIOUS   08-09-22: chest x-ray: Mild prominent interstitial densities bilateral compatible with pneumonitis. Mild cardiomegaly; mild osteopenia; mild osteoarthritis.   NO NEW EXAMS   LABS REVIEWED PREVIOUS   02-12-22: wbc 7.8; hgb 14.8; hct 44.7; mcv 89.2 plt 221; glucose 180; bun 24; creat 0.89; k+ 4.0; na++ 141; ca 9.0; GFR 56; protein 6.6; albumin 3.6; hgb a1c 5.5; tsh 2.305 free t4: 0.94; vitamin B12: 2092; vitamin B6: 72.2 vitamin D 24.4   08-09-22:  wbc 9.7; hgb 14.3; hct 43.2; mcv 89.6 plt 188; glucose 133; bun 26; creat 0.98; k+ 3.5; na++ 138; ca 8.7; gfr 55; ddimer: 0.95 crp 3.8 08-13-22: TSH 0.179; vitamin D 35.44   NO NEW LABS.    Review of Systems  Constitutional:  Negative for malaise/fatigue.  Respiratory:  Negative for cough and shortness of breath.   Cardiovascular:  Negative for chest pain, palpitations and leg swelling.  Gastrointestinal:  Negative for abdominal pain, constipation and heartburn.  Musculoskeletal:  Negative for back pain, joint pain and myalgias.  Skin: Negative.   Neurological:  Negative for dizziness.  Psychiatric/Behavioral:  The patient is not nervous/anxious.    Physical Exam Constitutional:      General: She is not in acute distress.    Appearance: She is well-developed. She is not diaphoretic.  Neck:     Thyroid: No thyromegaly.  Cardiovascular:     Rate and Rhythm: Normal rate and regular rhythm.     Pulses: Normal pulses.     Heart sounds: Normal heart sounds.  Pulmonary:     Effort: Pulmonary effort is normal. No respiratory distress.     Breath sounds: Normal breath sounds.  Abdominal:     General: Bowel sounds are normal. There is no distension.     Palpations: Abdomen is soft.     Tenderness: There is no abdominal tenderness.  Musculoskeletal:     Cervical back: Neck supple.     Right lower leg: No edema.     Left lower leg: No edema.     Comments: Left hemiplegia   Lymphadenopathy:     Cervical: No cervical adenopathy.  Skin:    General: Skin is warm and dry.  Neurological:     Mental Status: She is alert. Mental status is at baseline.  Psychiatric:        Mood and Affect: Mood normal.      ASSESSMENT/ PLAN:  TODAY  Thoracic aortic atherosclerosis Left hemiplegia Interstitial lung disease   Will continue current medications Will continue current plan of care Will continue to monitor her status   Time spent with patient: 40 minutes: medications; dietary; plan  of care    Ok Edwards NP [facility]  call [phone]

## 2022-11-15 ENCOUNTER — Non-Acute Institutional Stay (SKILLED_NURSING_FACILITY): Payer: Medicare Other | Admitting: Adult Health

## 2022-11-15 ENCOUNTER — Encounter: Payer: Self-pay | Admitting: Adult Health

## 2022-11-15 DIAGNOSIS — F339 Major depressive disorder, recurrent, unspecified: Secondary | ICD-10-CM | POA: Diagnosis not present

## 2022-11-15 DIAGNOSIS — E559 Vitamin D deficiency, unspecified: Secondary | ICD-10-CM

## 2022-11-15 DIAGNOSIS — F0390 Unspecified dementia without behavioral disturbance: Secondary | ICD-10-CM

## 2022-11-15 DIAGNOSIS — E782 Mixed hyperlipidemia: Secondary | ICD-10-CM

## 2022-11-15 NOTE — Progress Notes (Signed)
Location:  [facility] Room Number: 138 P Place of Service:  SNF (31)   CODE STATUS: DNR  Allergies  Allergen Reactions   Hydrochlorothiazide     05/29/2021 K+ 2.6 Other reaction(s): Other (See Comments) 05/29/2021 K+ 2.6   Codeine Nausea Only    Chief Complaint  Patient presents with   Medical Management of Chronic Issues                  Vitamin D deficiency:  Mixed hyperlipidemia; Psychosis in elderly without behavioral disturbance Depression recurrent    HPI:  She is a 89 year old long term resident of this facility being seen for the management of her chronic illnesses: Vitamin D deficiency:  Mixed hyperlipidemia; Psychosis in elderly without behavioral disturbance Depression recurrent. There are no reports of uncontrolled pain. Her weight is stable. There are no reports of anxiety or depressive thoughts.   Past Medical History:  Diagnosis Date   Decreased sense of smell    and taste-negative CT scan-2011   Depression    Essential hypertension    GERD (gastroesophageal reflux disease)    Heart murmur    heard first time 4/12   History of mammogram    2010 and she does not want to repeat them anymore   HOCM (hypertrophic obstructive cardiomyopathy) ([place])    a. Dx by echo 05/2014.   Hypothyroidism    IBS (irritable bowel syndrome)    Impaired fasting glucose    Low back pain    Lumbar radiculopathy    with negative MRI (1991)   Postmenopausal    with osteopenia, bisphosphonates 1/05-1/11, improved osteopenia 4/12-repeat planned late 2015   Shingles    2015   Thyroid nodule    Vitamin D deficiency    repleted on weekly vitamin D 50000 iu    Past Surgical History:  Procedure Laterality Date   ABDOMINAL HYSTERECTOMY     BREAST BIOPSY     x2   CATARACT EXTRACTION     removal with IOL bilateral   GLAUCOMA SURGERY     laser   skin cancer removal     THYROIDECTOMY     subtotal    Social History   Socioeconomic History   Marital  status: Married    Spouse name: Not on file   Number of children: Not on file   Years of education: Not on file   Highest education level: Not on file  Occupational History   Not on file  Tobacco Use   Smoking status: Never   Smokeless tobacco: Never  Vaping Use   Vaping Use: Never used  Substance and Sexual Activity   Alcohol use: No    Alcohol/week: 0.0 standard drinks of alcohol   Drug use: Never   Sexual activity: Not on file  Other Topics Concern   Not on file  Social History Narrative   Not on file   Social Determinants of Health   Financial Resource Strain: Not on file  Food Insecurity: Not on file  Transportation Needs: Not on file  Physical Activity: Not on file  Stress: Not on file  Social Connections: Not on file  Intimate Partner Violence: Not on file   Family History  Problem Relation Age of Onset   Breast cancer Mother    Hypertension Father    CVA Father    Heart failure Sister    CAD Sister    Kidney disease Sister    COPD Brother  Lung cancer Brother    CAD Brother    Diabetes Brother    Stroke Father    Heart attack Neg Hx       VITAL SIGNS BP (!) 146/84   Pulse 85   Temp (!) 97.2 F (36.2 C)   Ht '5\' 2"'$  (1.575 m)   Wt 136 lb 12.8 oz (62.1 kg)   BMI 25.02 kg/m   Outpatient Encounter Medications as of 11/15/2022  Medication Sig   acetaminophen (TYLENOL) 325 MG tablet Take 650 mg by mouth every 6 (six) hours.   amantadine (SYMMETREL) 50 MG/5ML solution Take 50 mg by mouth 2 (two) times daily. With breakfast and lunch   aspirin EC 81 MG EC tablet Take 1 tablet (81 mg total) by mouth daily with breakfast. Swallow whole.   Cholecalciferol (VITAMIN D-3) 25 MCG (1000 UT) CAPS Take 1 capsule by mouth daily.   donepezil (ARICEPT) 5 MG tablet Take 1 tablet (5 mg total) by mouth at bedtime.   famotidine (PEPCID) 20 MG tablet Take 1 tablet (20 mg total) by mouth daily.   levothyroxine (SYNTHROID) 50 MCG tablet Take by mouth daily before  breakfast.   memantine (NAMENDA XR) 28 MG CP24 24 hr capsule Take 28 mg by mouth daily.   Menthol, Topical Analgesic, (BIOFREEZE) 4 % GEL Apply 1 Application topically 3 (three) times daily as needed. Left shoulder pain   NON FORMULARY Diet:  Regular, thin liquids- Add prune juice to Breakfast tray   oxymetazoline (AFRIN) 0.05 % nasal spray Place 1 spray into both nostrils as needed. epistaxis   polyethylene glycol (MIRALAX / GLYCOLAX) 17 g packet Take 17 g by mouth daily.   sertraline (ZOLOFT) 25 MG tablet Take 25 mg by mouth daily.  to be given with '50mg'$  to = '75mg'$    sertraline (ZOLOFT) 50 MG tablet Take 50 mg by mouth daily. to be given with '25mg'$  to = '75mg'$    sodium chloride (OCEAN) 0.65 % SOLN nasal spray Place 1 spray into both nostrils 3 (three) times daily. And as needed   vitamin C (ASCORBIC ACID) 500 MG tablet Take 500 mg by mouth daily.   [DISCONTINUED] Vitamin D, Ergocalciferol, (DRISDOL) 1.25 MG (50000 UNIT) CAPS capsule Take 50,000 Units by mouth every 7 (seven) days.   No facility-administered encounter medications on file as of 11/15/2022.     SIGNIFICANT DIAGNOSTIC EXAMS   PREVIOUS   08-09-22: chest x-ray: Mild prominent interstitial densities bilateral compatible with pneumonitis. Mild cardiomegaly; mild osteopenia; mild osteoarthritis.   NO NEW EXAMS   LABS REVIEWED PREVIOUS   02-12-22: wbc 7.8; hgb 14.8; hct 44.7; mcv 89.2 plt 221; glucose 180; bun 24; creat 0.89; k+ 4.0; na++ 141; ca 9.0; GFR 56; protein 6.6; albumin 3.6; hgb a1c 5.5; tsh 2.305 free t4: 0.94; vitamin B12: 2092; vitamin B6: 72.2 vitamin D 24.4   08-09-22: wbc 9.7; hgb 14.3; hct 43.2; mcv 89.6 plt 188; glucose 133; bun 26; creat 0.98; k+ 3.5; na++ 138; ca 8.7; gfr 55; ddimer: 0.95 crp 3.8 08-13-22: TSH 0.179; vitamin D 35.44   TODAY  09-13-22: tsh 2.261 free t4: 0.95   Review of Systems  Constitutional:  Negative for malaise/fatigue.  Respiratory:  Negative for cough and shortness of breath.    Cardiovascular:  Negative for chest pain, palpitations and leg swelling.  Gastrointestinal:  Negative for abdominal pain, constipation and heartburn.  Musculoskeletal:  Negative for back pain, joint pain and myalgias.  Skin: Negative.   Neurological:  Negative for dizziness.  Psychiatric/Behavioral:  The patient is not nervous/anxious.    Physical Exam Constitutional:      General: She is not in acute distress.    Appearance: She is well-developed. She is not diaphoretic.  Neck:     Thyroid: No thyromegaly.  Cardiovascular:     Rate and Rhythm: Normal rate and regular rhythm.     Pulses: Normal pulses.     Heart sounds: Normal heart sounds.  Pulmonary:     Effort: Pulmonary effort is normal. No respiratory distress.     Breath sounds: Normal breath sounds.  Abdominal:     General: Bowel sounds are normal. There is no distension.     Palpations: Abdomen is soft.     Tenderness: There is no abdominal tenderness.  Musculoskeletal:     Cervical back: Neck supple.     Right lower leg: No edema.     Left lower leg: No edema.     Comments: Left hemiplegia   Lymphadenopathy:     Cervical: No cervical adenopathy.  Skin:    General: Skin is warm and dry.  Neurological:     Mental Status: She is alert. Mental status is at baseline.  Psychiatric:        Mood and Affect: Mood normal.        ASSESSMENT/ PLAN:  TODAY  Vitamin D deficiency: is 24.4 will continue 1,000 units daily   2. Mixed hyperlipidemia; is off statin due to advanced age  52. Psychosis in elderly without behavioral disturbance is off seroquel  4. Depression recurrent: will continue zoloft 75 mg daily   PREVIOUS   5. GERD without esophagitis: will continue pepcid 20 mg daily  6. Chronic constipation: will continue miralax every other day  7. Thoracic aortic atherosclerosis: (cxr 12-15-14) not on statin due to her advanced age  49. History of stroke/left hemiplegia: is on asa 81 mg daily   9. Somnolence  post stroke: is on amantadine 50 mg twice daily   10. HOCM (hypertrophic obstructive cardiomyopathy) will monitor  11. Dysphagia: no indications of aspiration; is on thin liquids  12. Stage 3a chronic kidney disease: bun 24; creat 0.8; gfr >60  13. Essential hypertension: 146/84: at this time will not make changes; if her readings remain elevated will make changes at that time.    14. Interstitial lung disease: is stable.   15. Vascular dementia without behavioral disturbance: weight is 136 pounds; will continue aricept 5 mg nightly and namenda xr 28 mg daily   16. Hypoalbuminemia due to protein calorie malnutrition; albumin 3.4 will monitor    17. Vitamin B 12: 2092 is off supplement  18. Postoperative hypothyroidism:   tsh 2.261 free t4: 0.95 will continue synthroid 50 mcg daily         Ok Edwards NP [facility]   call [phone]

## 2022-11-20 ENCOUNTER — Other Ambulatory Visit (HOSPITAL_COMMUNITY)
Admission: RE | Admit: 2022-11-20 | Discharge: 2022-11-20 | Disposition: A | Discharge status: Home / Self Care | Payer: Medicare Other | Source: Skilled Nursing Facility | Attending: Adult Health | Admitting: Adult Health

## 2022-11-20 DIAGNOSIS — N390 Urinary tract infection, site not specified: Secondary | ICD-10-CM | POA: Insufficient documentation

## 2022-11-22 ENCOUNTER — Non-Acute Institutional Stay (SKILLED_NURSING_FACILITY): Payer: Medicare Other | Admitting: Adult Health

## 2022-11-22 ENCOUNTER — Encounter: Payer: Self-pay | Admitting: Adult Health

## 2022-11-22 DIAGNOSIS — N390 Urinary tract infection, site not specified: Secondary | ICD-10-CM | POA: Insufficient documentation

## 2022-11-22 NOTE — Progress Notes (Signed)
Location:  [facility] Room Number: 138-P Place of Service:  SNF (31)   CODE STATUS: DNR  Allergies  Allergen Reactions   Hydrochlorothiazide     05/29/2021 K+ 2.6 Other reaction(s): Other (See Comments) 05/29/2021 K+ 2.6   Codeine Nausea Only    Chief Complaint  Patient presents with   Acute Visit    UTI    HPI:  She has had increased fatigue; dysuria; pelvic tenderness; bladder pain and nausea. There are no report of fevers present. Her urine culture has returned gram meg rods. Will need to start abt pending the final culture reports.   Past Medical History:  Diagnosis Date   Decreased sense of smell    and taste-negative CT scan-2011   Depression    Essential hypertension    GERD (gastroesophageal reflux disease)    Heart murmur    heard first time 4/12   History of mammogram    2010 and she does not want to repeat them anymore   HOCM (hypertrophic obstructive cardiomyopathy) ([place])    a. Dx by echo 05/2014.   Hypothyroidism    IBS (irritable bowel syndrome)    Impaired fasting glucose    Low back pain    Lumbar radiculopathy    with negative MRI (1991)   Postmenopausal    with osteopenia, bisphosphonates 1/05-1/11, improved osteopenia 4/12-repeat planned late 2015   Shingles    2015   Thyroid nodule    Vitamin D deficiency    repleted on weekly vitamin D 50000 iu    Past Surgical History:  Procedure Laterality Date   ABDOMINAL HYSTERECTOMY     BREAST BIOPSY     x2   CATARACT EXTRACTION     removal with IOL bilateral   GLAUCOMA SURGERY     laser   skin cancer removal     THYROIDECTOMY     subtotal    Social History   Socioeconomic History   Marital status: Married    Spouse name: Not on file   Number of children: Not on file   Years of education: Not on file   Highest education level: Not on file  Occupational History   Not on file  Tobacco Use   Smoking status: Never   Smokeless tobacco: Never  Vaping Use   Vaping  Use: Never used  Substance and Sexual Activity   Alcohol use: No    Alcohol/week: 0.0 standard drinks of alcohol   Drug use: Never   Sexual activity: Not on file  Other Topics Concern   Not on file  Social History Narrative   Not on file   Social Determinants of Health   Financial Resource Strain: Not on file  Food Insecurity: Not on file  Transportation Needs: Not on file  Physical Activity: Not on file  Stress: Not on file  Social Connections: Not on file  Intimate Partner Violence: Not on file   Family History  Problem Relation Age of Onset   Breast cancer Mother    Hypertension Father    CVA Father    Heart failure Sister    CAD Sister    Kidney disease Sister    COPD Brother    Lung cancer Brother    CAD Brother    Diabetes Brother    Stroke Father    Heart attack Neg Hx       VITAL SIGNS BP (!) 145/78   Pulse 76   Temp (!) 97.2 F (36.2  C)   Resp (!) 22   Ht '5\' 2"'$  (1.575 m)   Wt 136 lb 12.8 oz (62.1 kg)   SpO2 95%   BMI 25.02 kg/m   Outpatient Encounter Medications as of 11/22/2022  Medication Sig   acetaminophen (TYLENOL) 325 MG tablet Take 650 mg by mouth every 6 (six) hours.   amantadine (SYMMETREL) 50 MG/5ML solution Take 50 mg by mouth 2 (two) times daily. With breakfast and lunch   aspirin EC 81 MG EC tablet Take 1 tablet (81 mg total) by mouth daily with breakfast. Swallow whole.   Cholecalciferol (VITAMIN D-3) 25 MCG (1000 UT) CAPS Take 1 capsule by mouth daily.   donepezil (ARICEPT) 5 MG tablet Take 1 tablet (5 mg total) by mouth at bedtime.   famotidine (PEPCID) 20 MG tablet Take 1 tablet (20 mg total) by mouth daily.   levothyroxine (SYNTHROID) 50 MCG tablet Take by mouth daily before breakfast.   memantine (NAMENDA XR) 28 MG CP24 24 hr capsule Take 28 mg by mouth daily.   Menthol, Topical Analgesic, (BIOFREEZE) 4 % GEL Apply 1 Application topically 3 (three) times daily as needed. Left shoulder pain   NON FORMULARY Diet:  Regular, thin  liquids- Add prune juice to Breakfast tray   ondansetron (ZOFRAN-ODT) 8 MG disintegrating tablet Take 8 mg by mouth every 6 (six) hours.   oxymetazoline (AFRIN) 0.05 % nasal spray Place 1 spray into both nostrils as needed. epistaxis   polyethylene glycol (MIRALAX / GLYCOLAX) 17 g packet Take 17 g by mouth daily.   sertraline (ZOLOFT) 25 MG tablet Take 25 mg by mouth daily.  to be given with '50mg'$  to = '75mg'$    sertraline (ZOLOFT) 50 MG tablet Take 50 mg by mouth daily. to be given with '25mg'$  to = '75mg'$    sodium chloride (OCEAN) 0.65 % SOLN nasal spray Place 1 spray into both nostrils 3 (three) times daily. And as needed   sulfamethoxazole-trimethoprim (BACTRIM DS) 800-160 MG tablet Take 1 tablet by mouth 2 (two) times daily.   vitamin C (ASCORBIC ACID) 500 MG tablet Take 500 mg by mouth daily.   No facility-administered encounter medications on file as of 11/22/2022.     SIGNIFICANT DIAGNOSTIC EXAMS  PREVIOUS   08-09-22: chest x-ray: Mild prominent interstitial densities bilateral compatible with pneumonitis. Mild cardiomegaly; mild osteopenia; mild osteoarthritis.   NO NEW EXAMS   LABS REVIEWED PREVIOUS   02-12-22: wbc 7.8; hgb 14.8; hct 44.7; mcv 89.2 plt 221; glucose 180; bun 24; creat 0.89; k+ 4.0; na++ 141; ca 9.0; GFR 56; protein 6.6; albumin 3.6; hgb a1c 5.5; tsh 2.305 free t4: 0.94; vitamin B12: 2092; vitamin B6: 72.2 vitamin D 24.4   08-09-22: wbc 9.7; hgb 14.3; hct 43.2; mcv 89.6 plt 188; glucose 133; bun 26; creat 0.98; k+ 3.5; na++ 138; ca 8.7; gfr 55; ddimer: 0.95 crp 3.8 08-13-22: TSH 0.179; vitamin D 35.44   TODAY  09-13-22: tsh 2.261 free t4: 0.95 11-20-22: urine culture: gram neg rods  Review of Systems  Constitutional:  Positive for malaise/fatigue.  Respiratory:  Negative for cough and shortness of breath.   Cardiovascular:  Negative for chest pain, palpitations and leg swelling.  Gastrointestinal:  Positive for nausea. Negative for abdominal pain, constipation,  heartburn and vomiting.  Genitourinary:  Positive for dysuria.  Musculoskeletal:  Positive for back pain. Negative for joint pain and myalgias.  Skin: Negative.   Neurological:  Negative for dizziness.  Psychiatric/Behavioral:  The patient is not nervous/anxious.  ASSESSMENT/ PLAN:  TODAY  Acute UTI: will begin bactrim DS twice daily through 11-27-22 pending final culture results.    Ok Edwards NP [facility]   call [phone]

## 2022-11-30 LAB — SUSCEPTIBILITY RESULT

## 2022-11-30 LAB — SUSCEPTIBILITY, AER + ANAEROB

## 2022-12-01 LAB — URINE CULTURE: Culture: >=100000 — AB

## 2022-12-13 ENCOUNTER — Non-Acute Institutional Stay (SKILLED_NURSING_FACILITY): Payer: Medicare Other | Admitting: Adult Health

## 2022-12-13 ENCOUNTER — Encounter: Payer: Self-pay | Admitting: Adult Health

## 2022-12-13 DIAGNOSIS — G8194 Hemiplegia, unspecified affecting left nondominant side: Secondary | ICD-10-CM | POA: Diagnosis not present

## 2022-12-13 DIAGNOSIS — I700 Atherosclerosis of aorta: Secondary | ICD-10-CM

## 2022-12-13 DIAGNOSIS — K5909 Other constipation: Secondary | ICD-10-CM

## 2022-12-13 DIAGNOSIS — Z8673 Personal history of transient ischemic attack (TIA), and cerebral infarction without residual deficits: Secondary | ICD-10-CM

## 2022-12-13 DIAGNOSIS — K219 Gastro-esophageal reflux disease without esophagitis: Secondary | ICD-10-CM

## 2022-12-13 NOTE — Progress Notes (Signed)
Location:  [facility] Room Number: 138 P Place of Service:  SNF (31)   CODE STATUS: DNR  Allergies  Allergen Reactions   Hydrochlorothiazide     05/29/2021 K+ 2.6 Other reaction(s): Other (See Comments) 05/29/2021 K+ 2.6   Codeine Nausea Only    Chief Complaint  Patient presents with   Medical Management of Chronic Issues                    GERD without esophagitis: Chronic constipation: Thoracic aortic atherosclerosis History of CVA/ left hemiplegia:     HPI:  She is a 89 year old long term resident of this facility being seen for the management of her chronic illnesses: GERD without esophagitis: Chronic constipation: Thoracic aortic atherosclerosis History of CVA/ left hemiplegia. There are no reports of uncontrolled pain. No reports of agitation or anxiety   Past Medical History:  Diagnosis Date   Decreased sense of smell    and taste-negative CT scan-2011   Depression    Essential hypertension    GERD (gastroesophageal reflux disease)    Heart murmur    heard first time 4/12   History of mammogram    2010 and she does not want to repeat them anymore   HOCM [place]hypertrophic obstructive cardiomyopathy[place]    a. Dx by echo 05/2014.   Hypothyroidism    IBS (irritable bowel syndrome)    Impaired fasting glucose    Low back pain    Lumbar radiculopathy    with negative MRI (1991)   Postmenopausal    with osteopenia, bisphosphonates 1/05-1/11, improved osteopenia 4/12-repeat planned late 2015   Shingles    2015   Thyroid nodule    Vitamin D deficiency    repleted on weekly vitamin D 50000 iu    Past Surgical History:  Procedure Laterality Date   ABDOMINAL HYSTERECTOMY     BREAST BIOPSY     x2   CATARACT EXTRACTION     removal with IOL bilateral   GLAUCOMA SURGERY     laser   skin cancer removal     THYROIDECTOMY     subtotal    Social History   Socioeconomic History   Marital status: Married    Spouse name: Not on file    Number of children: Not on file   Years of education: Not on file   Highest education level: Not on file  Occupational History   Not on file  Tobacco Use   Smoking status: Never   Smokeless tobacco: Never  Vaping Use   Vaping Use: Never used  Substance and Sexual Activity   Alcohol use: No    Alcohol/week: 0.0 standard drinks of alcohol   Drug use: Never   Sexual activity: Not on file  Other Topics Concern   Not on file  Social History Narrative   Not on file   Social Determinants of Health   Financial Resource Strain: Not on file  Food Insecurity: Not on file  Transportation Needs: Not on file  Physical Activity: Not on file  Stress: Not on file  Social Connections: Not on file  Intimate Partner Violence: Not on file   Family History  Problem Relation Age of Onset   Breast cancer Mother    Hypertension Father    CVA Father    Heart failure Sister    CAD Sister    Kidney disease Sister    COPD Brother    Lung cancer Brother  CAD Brother    Diabetes Brother    Stroke Father    Heart attack Neg Hx       VITAL SIGNS BP 132/78   Pulse 75   Temp 98.3 F (36.8 C)   Ht '5\' 2"'$  (1.575 m)   Wt 130 lb (59 kg) Comment: weight taken from [facility] EMR system [employer]  BMI 23.78 kg/m   Outpatient Encounter Medications as of 12/13/2022  Medication Sig   acetaminophen (TYLENOL) 325 MG tablet Take 650 mg by mouth every 6 (six) hours.   amantadine (SYMMETREL) 50 MG/5ML solution Take 50 mg by mouth 2 (two) times daily. With breakfast and lunch   aspirin EC 81 MG EC tablet Take 1 tablet (81 mg total) by mouth daily with breakfast. Swallow whole.   Cholecalciferol (VITAMIN D-3) 25 MCG (1000 UT) CAPS Take 1 capsule by mouth daily.   donepezil (ARICEPT) 5 MG tablet Take 1 tablet (5 mg total) by mouth at bedtime.   famotidine (PEPCID) 20 MG tablet Take 1 tablet (20 mg total) by mouth daily.   levothyroxine (SYNTHROID) 50 MCG tablet Take by mouth daily before breakfast.    memantine (NAMENDA XR) 28 MG CP24 24 hr capsule Take 28 mg by mouth daily.   Menthol, Topical Analgesic, (BIOFREEZE) 4 % GEL Apply 1 Application topically 3 (three) times daily as needed. Left shoulder pain   NON FORMULARY Diet:  Regular, thin liquids- Add prune juice to Breakfast tray   oxymetazoline (AFRIN) 0.05 % nasal spray Place 1 spray into both nostrils as needed. epistaxis   polyethylene glycol (MIRALAX / GLYCOLAX) 17 g packet Take 17 g by mouth daily.   sertraline (ZOLOFT) 25 MG tablet Take 25 mg by mouth daily.  to be given with '50mg'$  to = '75mg'$    sertraline (ZOLOFT) 50 MG tablet Take 50 mg by mouth daily. to be given with '25mg'$  to = '75mg'$    sodium chloride (OCEAN) 0.65 % SOLN nasal spray Place 1 spray into both nostrils 3 (three) times daily. And as needed   UNABLE TO FIND Med Name: Medpass 120 mL twice daily due to weight loss following acute illness   vitamin C (ASCORBIC ACID) 500 MG tablet Take 500 mg by mouth daily.   [DISCONTINUED] ondansetron (ZOFRAN-ODT) 8 MG disintegrating tablet Take 8 mg by mouth every 6 (six) hours.   [DISCONTINUED] sulfamethoxazole-trimethoprim (BACTRIM DS) 800-160 MG tablet Take 1 tablet by mouth 2 (two) times daily.   No facility-administered encounter medications on file as of 12/13/2022.     SIGNIFICANT DIAGNOSTIC EXAMS  PREVIOUS   08-09-22: chest x-ray: Mild prominent interstitial densities bilateral compatible with pneumonitis. Mild cardiomegaly; mild osteopenia; mild osteoarthritis.   NO NEW EXAMS   LABS REVIEWED PREVIOUS   02-12-22: wbc 7.8; hgb 14.8; hct 44.7; mcv 89.2 plt 221; glucose 180; bun 24; creat 0.89; k+ 4.0; na++ 141; ca 9.0; GFR 56; protein 6.6; albumin 3.6; hgb a1c 5.5; tsh 2.305 free t4: 0.94; vitamin B12: 2092; vitamin B6: 72.2 vitamin D 24.4   08-09-22: wbc 9.7; hgb 14.3; hct 43.2; mcv 89.6 plt 188; glucose 133; bun 26; creat 0.98; k+ 3.5; na++ 138; ca 8.7; gfr 55; ddimer: 0.95 crp 3.8 08-13-22: TSH 0.179; vitamin D 35.44   09-13-22: tsh 2.261 free t4: 0.95 11-20-22: urine culture: gram neg rods  NO NEW LABS.   Review of Systems  Constitutional:  Negative for malaise/fatigue.  Respiratory:  Negative for cough and shortness of breath.   Cardiovascular:  Negative for  chest pain, palpitations and leg swelling.  Gastrointestinal:  Negative for abdominal pain, constipation and heartburn.  Musculoskeletal:  Negative for back pain, joint pain and myalgias.  Skin: Negative.   Neurological:  Negative for dizziness.  Psychiatric/Behavioral:  The patient is not nervous/anxious.    Physical Exam Constitutional:      General: She is not in acute distress.    Appearance: She is well-developed. She is not diaphoretic.  Neck:     Thyroid: No thyromegaly.  Cardiovascular:     Rate and Rhythm: Normal rate and regular rhythm.     Pulses: Normal pulses.     Heart sounds: Normal heart sounds.  Pulmonary:     Effort: Pulmonary effort is normal. No respiratory distress.     Breath sounds: Normal breath sounds.  Abdominal:     General: Bowel sounds are normal. There is no distension.     Palpations: Abdomen is soft.     Tenderness: There is no abdominal tenderness.  Musculoskeletal:     Cervical back: Neck supple.     Right lower leg: No edema.     Left lower leg: No edema.     Comments: Left hemiplegia   Lymphadenopathy:     Cervical: No cervical adenopathy.  Skin:    General: Skin is warm and dry.  Neurological:     Mental Status: She is alert. Mental status is at baseline.  Psychiatric:        Mood and Affect: Mood normal.       ASSESSMENT/ PLAN:  TODAY  GERD without esophagitis: will continue pepcid 20 mg daily   2. Chronic constipation: will continue miralax every other day  3. Thoracic aortic atherosclerosis (Cxr 12-15-14) is not on statin due to advanced age.   4. History of CVA/ left hemiplegia: is on asa 81 mg daily   PREVIOUS   5. Somnolence post stroke: is on amantadine 50 mg twice  daily   6. HOCM (hypertrophic obstructive cardiomyopathy) will monitor  7. Dysphagia: no indications of aspiration; is on thin liquids  8. Stage 3a chronic kidney disease: bun 24; creat 0.8; gfr >60  9. Essential hypertension: 146/84: at this time will not make changes; if her readings remain elevated will make changes at that time.    10. Interstitial lung disease: is stable.   11. Vascular dementia without behavioral disturbance: weight is 130 pounds; will continue aricept 5 mg nightly and namenda xr 28 mg daily   12. Hypoalbuminemia due to protein calorie malnutrition; albumin 3.4 will monitor    13. Vitamin B 12: 2092 is off supplement  14. Postoperative hypothyroidism:   tsh 2.261 free t4: 0.95 will continue synthroid 50 mcg daily   15. Vitamin D deficiency: is 24.4 will continue 1,000 units daily   16. Mixed hyperlipidemia; is off statin due to advanced age  83. Psychosis in elderly without behavioral disturbance is off seroquel  18. Depression recurrent: will continue zoloft 75 mg daily     Ok Edwards NP [facility]   call [phone]

## 2023-01-01 ENCOUNTER — Encounter: Payer: Self-pay | Admitting: Internal Medicine

## 2023-01-01 ENCOUNTER — Non-Acute Institutional Stay (SKILLED_NURSING_FACILITY): Payer: Medicare Other | Admitting: Internal Medicine

## 2023-01-01 DIAGNOSIS — E46 Unspecified protein-calorie malnutrition: Secondary | ICD-10-CM

## 2023-01-01 DIAGNOSIS — E8809 Other disorders of plasma-protein metabolism, not elsewhere classified: Secondary | ICD-10-CM | POA: Diagnosis not present

## 2023-01-01 DIAGNOSIS — N1831 Chronic kidney disease, stage 3a: Secondary | ICD-10-CM

## 2023-01-01 DIAGNOSIS — J849 Interstitial pulmonary disease, unspecified: Secondary | ICD-10-CM

## 2023-01-01 DIAGNOSIS — I10 Essential (primary) hypertension: Secondary | ICD-10-CM | POA: Diagnosis not present

## 2023-01-01 NOTE — Progress Notes (Unsigned)
   NURSING HOME LOCATION:  [facility] ROOM NUMBER:  138 P  CODE STATUS:  DNR  PCP:  Ok Edwards NP  This is a nursing facility follow up visit of chronic medical diagnoses & to document compliance with Regulation 483.30 (c) in [employer] Phase 2 which mandates caregiver visit ( visits can alternate among physician, PA or NP as per statutes) within 10 days of 30 days / 60 days/ 90 days post admission to SNF date    Interim medical record and care since last SNF visit was updated with review of diagnostic studies and change in clinical status since last visit were documented.  HPI:  Review of systems: Dementia invalidated responses. Date given as   Constitutional: No fever, significant weight change, fatigue  Eyes: No redness, discharge, pain, vision change ENT/mouth: No nasal congestion,  purulent discharge, earache, change in hearing, sore throat  Cardiovascular: No chest pain, palpitations, paroxysmal nocturnal dyspnea, claudication, edema  Respiratory: No cough, sputum production, hemoptysis, DOE, significant snoring, apnea   Gastrointestinal: No heartburn, dysphagia, abdominal pain, nausea /vomiting, rectal bleeding, melena, change in bowels Genitourinary: No dysuria, hematuria, pyuria, incontinence, nocturia Musculoskeletal: No joint stiffness, joint swelling, weakness, pain Dermatologic: No rash, pruritus, change in appearance of skin Neurologic: No dizziness, headache, syncope, seizures, numbness, tingling Psychiatric: No significant anxiety, depression, insomnia, anorexia Endocrine: No change in hair/skin/nails, excessive thirst, excessive hunger, excessive urination  Hematologic/lymphatic: No significant bruising, lymphadenopathy, abnormal bleeding Allergy/immunology: No itchy/watery eyes, significant sneezing, urticaria, angioedema  Physical exam:  Pertinent or positive findings: General appearance: Adequately nourished; no acute  distress, increased work of breathing is present.   Lymphatic: No lymphadenopathy about the head, neck, axilla. Eyes: No conjunctival inflammation or lid edema is present. There is no scleral icterus. Ears:  External ear exam shows no significant lesions or deformities.   Nose:  External nasal examination shows no deformity or inflammation. Nasal mucosa are pink and moist without lesions, exudates Oral exam:  Lips and gums are healthy appearing. There is no oropharyngeal erythema or exudate. Neck:  No thyromegaly, masses, tenderness noted.    Heart:  Normal rate and regular rhythm. S1 and S2 normal without gallop, murmur, click, rub .  Lungs: Chest clear to auscultation without wheezes, rhonchi, rales, rubs. Abdomen: Bowel sounds are normal. Abdomen is soft and nontender with no organomegaly, hernias, masses. GU: Deferred  Extremities:  No cyanosis, clubbing, edema  Neurologic exam : Cn 2-7 intact Strength equal  in upper & lower extremities Balance, Rhomberg, finger to nose testing could not be completed due to clinical state Deep tendon reflexes are equal Skin: Warm & dry w/o tenting. No significant lesions or rash.  See summary under each active problem in the Problem List with associated updated therapeutic plan

## 2023-01-02 NOTE — Patient Instructions (Signed)
See assessment and plan under each diagnosis in the problem list and acutely for this visit 

## 2023-01-02 NOTE — Assessment & Plan Note (Signed)
Current albumin 3.1 and total protein 6.1.  She does have interosseous wasting and limb atrophy in the setting of left hemiplegia.  Nutritionist continues to follow.  Appetite is fair.

## 2023-01-02 NOTE — Assessment & Plan Note (Addendum)
She is nonambulatory; she is asymptomatic in reference to interstitial lung disease.  O2 sats are excellent on room air.  Because of age and advanced comorbidities; no further evaluation or intervention indicated clinically.  This was discussed with her daughters.

## 2023-01-02 NOTE — Assessment & Plan Note (Addendum)
CKD is now high stage IIIa with a GFR of 59; this indicates slight improvement.  Med list reviewed; no change in meds or dosages indicated in absence of progression of the CKD.

## 2023-01-02 NOTE — Assessment & Plan Note (Addendum)
BP controlled w/o antihypertensive medications. HTN resolved.

## 2023-01-11 ENCOUNTER — Non-Acute Institutional Stay (SKILLED_NURSING_FACILITY): Payer: Medicare Other | Admitting: Adult Health

## 2023-01-11 ENCOUNTER — Encounter: Payer: Self-pay | Admitting: Adult Health

## 2023-01-11 DIAGNOSIS — G8194 Hemiplegia, unspecified affecting left nondominant side: Secondary | ICD-10-CM | POA: Diagnosis not present

## 2023-01-11 DIAGNOSIS — J849 Interstitial pulmonary disease, unspecified: Secondary | ICD-10-CM | POA: Diagnosis not present

## 2023-01-11 DIAGNOSIS — I700 Atherosclerosis of aorta: Secondary | ICD-10-CM

## 2023-01-11 DIAGNOSIS — I421 Obstructive hypertrophic cardiomyopathy: Secondary | ICD-10-CM

## 2023-01-11 NOTE — Progress Notes (Unsigned)
Location:  [facility] Room Number: [id number] Place of Service:  SNF (31)   CODE STATUS: DNR  Allergies  Allergen Reactions   Hydrochlorothiazide     05/29/2021 K+ 2.6 Other reaction(s): Other (See Comments) 05/29/2021 K+ 2.6   Codeine Nausea Only    Chief Complaint  Patient presents with   Acute Visit    Patient is being seen for Care plan meeting    HPI:  We have come together for her care plan meeting. BIMS 6/15 mood 0/30. She is nonambulatory with 2 falls without injury. She requires dependent assist with her adls. She is frequently incontinent of bladder and bowel. Dietary: on regular diet setup for meals; weight is 135.4 pounds appetite 25-100%.  Therapy: training family: wheelchair independent; min to mod assist with transfers. Activities: needs encouragement; does participate most of time. She will continue to be followed for her chronic illnesses including:  HOCM (hypertrophic obstructive cardiomyopathy) Thoracic aortic atherosclerosis  Interstitial lung disease  Left hemiplegia   Past Medical History:  Diagnosis Date   Decreased sense of smell    and taste-negative CT scan-2011   Depression    Essential hypertension    GERD (gastroesophageal reflux disease)    Heart murmur    heard first time 4/12   History of mammogram    2010 and she does not want to repeat them anymore   HOCM (hypertrophic obstructive cardiomyopathy) ([place])    a. Dx by echo 05/2014.   Hypothyroidism    IBS (irritable bowel syndrome)    Impaired fasting glucose    Low back pain    Lumbar radiculopathy    with negative MRI (1991)   Postmenopausal    with osteopenia, bisphosphonates 1/05-1/11, improved osteopenia 4/12-repeat planned late 2015   Shingles    2015   Thyroid nodule    Vitamin D deficiency    repleted on weekly vitamin D 50000 iu    Past Surgical History:  Procedure Laterality Date   ABDOMINAL HYSTERECTOMY     BREAST BIOPSY     x2   CATARACT EXTRACTION      removal with IOL bilateral   GLAUCOMA SURGERY     laser   skin cancer removal     THYROIDECTOMY     subtotal    Social History   Socioeconomic History   Marital status: Married    Spouse name: Not on file   Number of children: Not on file   Years of education: Not on file   Highest education level: Not on file  Occupational History   Not on file  Tobacco Use   Smoking status: Never   Smokeless tobacco: Never  Vaping Use   Vaping Use: Never used  Substance and Sexual Activity   Alcohol use: No    Alcohol/week: 0.0 standard drinks of alcohol   Drug use: Never   Sexual activity: Not on file  Other Topics Concern   Not on file  Social History Narrative   Not on file   Social Determinants of Health   Financial Resource Strain: Not on file  Food Insecurity: Not on file  Transportation Needs: Not on file  Physical Activity: Not on file  Stress: Not on file  Social Connections: Not on file  Intimate Partner Violence: Not on file   Family History  Problem Relation Age of Onset   Breast cancer Mother    Hypertension Father    CVA Father    Heart failure Sister  CAD Sister    Kidney disease Sister    COPD Brother    Lung cancer Brother    CAD Brother    Diabetes Brother    Stroke Father    Heart attack Neg Hx       VITAL SIGNS BP 134/72   Pulse 78   Temp (!) 97.5 F (36.4 C) (Temporal)   Resp 20   Ht 5\' 2"  (1.575 m)   Wt 135 lb 6.4 oz (61.4 kg)   SpO2 96%   BMI 24.76 kg/m   Outpatient Encounter Medications as of 01/11/2023  Medication Sig   acetaminophen (TYLENOL) 325 MG tablet Take 650 mg by mouth every 6 (six) hours.   amantadine (SYMMETREL) 50 MG/5ML solution Take 50 mg by mouth 2 (two) times daily. With breakfast and lunch   aspirin EC 81 MG EC tablet Take 1 tablet (81 mg total) by mouth daily with breakfast. Swallow whole.   Cholecalciferol (VITAMIN D-3) 25 MCG (1000 UT) CAPS Take 1 capsule by mouth daily.   donepezil (ARICEPT) 5 MG tablet  Take 1 tablet (5 mg total) by mouth at bedtime.   famotidine (PEPCID) 20 MG tablet Take 1 tablet (20 mg total) by mouth daily.   levothyroxine (SYNTHROID) 50 MCG tablet Take by mouth daily before breakfast.   memantine (NAMENDA XR) 28 MG CP24 24 hr capsule Take 28 mg by mouth daily.   Menthol, Topical Analgesic, (BIOFREEZE) 4 % GEL Apply 1 Application topically 3 (three) times daily as needed. Left shoulder pain   NON FORMULARY Diet:  Regular, thin liquids- Add prune juice to Breakfast tray   oxymetazoline (AFRIN) 0.05 % nasal spray Place 1 spray into both nostrils as needed. epistaxis   polyethylene glycol (MIRALAX / GLYCOLAX) 17 g packet Take 17 g by mouth daily.   sertraline (ZOLOFT) 25 MG tablet Take 25 mg by mouth daily.  to be given with 50mg  to = 75mg    sertraline (ZOLOFT) 50 MG tablet Take 50 mg by mouth daily. to be given with 25mg  to = 75mg    sodium chloride (OCEAN) 0.65 % SOLN nasal spray Place 1 spray into both nostrils 3 (three) times daily. And as needed   UNABLE TO FIND Med Name: Medpass 120 mL twice daily due to weight loss following acute illness   vitamin C (ASCORBIC ACID) 500 MG tablet Take 500 mg by mouth daily.   No facility-administered encounter medications on file as of 01/11/2023.     SIGNIFICANT DIAGNOSTIC EXAMS  PREVIOUS   08-09-22: chest x-ray: Mild prominent interstitial densities bilateral compatible with pneumonitis. Mild cardiomegaly; mild osteopenia; mild osteoarthritis.   NO NEW EXAMS   LABS REVIEWED PREVIOUS   02-12-22: wbc 7.8; hgb 14.8; hct 44.7; mcv 89.2 plt 221; glucose 180; bun 24; creat 0.89; k+ 4.0; na++ 141; ca 9.0; GFR 56; protein 6.6; albumin 3.6; hgb a1c 5.5; tsh 2.305 free t4: 0.94; vitamin B12: 2092; vitamin B6: 72.2 vitamin D 24.4   08-09-22: wbc 9.7; hgb 14.3; hct 43.2; mcv 89.6 plt 188; glucose 133; bun 26; creat 0.98; k+ 3.5; na++ 138; ca 8.7; gfr 55; ddimer: 0.95 crp 3.8 08-13-22: TSH 0.179; vitamin D 35.44  09-13-22: tsh 2.261 free  t4: 0.95 11-20-22: urine culture: gram neg rods  NO NEW LABS.   Review of Systems  Constitutional:  Negative for malaise/fatigue.  Respiratory:  Negative for cough and shortness of breath.   Cardiovascular:  Negative for chest pain, palpitations and leg swelling.  Gastrointestinal:  Negative  for abdominal pain, constipation and heartburn.  Musculoskeletal:  Negative for back pain, joint pain and myalgias.  Skin: Negative.   Neurological:  Negative for dizziness.  Psychiatric/Behavioral:  The patient is not nervous/anxious.    Physical Exam Constitutional:      General: She is not in acute distress.    Appearance: She is well-developed. She is not diaphoretic.  Neck:     Thyroid: No thyromegaly.  Cardiovascular:     Rate and Rhythm: Normal rate and regular rhythm.     Pulses: Normal pulses.     Heart sounds: Normal heart sounds.  Pulmonary:     Effort: Pulmonary effort is normal. No respiratory distress.     Breath sounds: Normal breath sounds.  Abdominal:     General: Bowel sounds are normal. There is no distension.     Palpations: Abdomen is soft.     Tenderness: There is no abdominal tenderness.  Musculoskeletal:     Cervical back: Neck supple.     Right lower leg: No edema.     Left lower leg: No edema.     Comments: Left hemiplegia   Lymphadenopathy:     Cervical: No cervical adenopathy.  Skin:    General: Skin is warm and dry.  Neurological:     Mental Status: She is alert. Mental status is at baseline.  Psychiatric:        Mood and Affect: Mood normal.      ASSESSMENT/ PLAN:  TODAY  HOCM (hypertrophic obstructive cardiomyopathy) Thoracic aortic atherosclerosis Interstitial lung disease Left hemiplegia   Will continue current medications Will continue current plan of care Will continue to monitor her status.   Time spent with patient: 40 minutes: medications; dietary; therapy.    Ok Edwards NP [facility]  call [phone]

## 2023-01-29 ENCOUNTER — Non-Acute Institutional Stay (SKILLED_NURSING_FACILITY): Payer: Medicare Other | Admitting: Adult Health

## 2023-01-29 ENCOUNTER — Encounter: Payer: Self-pay | Admitting: Adult Health

## 2023-01-29 DIAGNOSIS — I421 Obstructive hypertrophic cardiomyopathy: Secondary | ICD-10-CM | POA: Diagnosis not present

## 2023-01-29 DIAGNOSIS — I69391 Dysphagia following cerebral infarction: Secondary | ICD-10-CM

## 2023-01-29 DIAGNOSIS — N1831 Chronic kidney disease, stage 3a: Secondary | ICD-10-CM

## 2023-01-29 DIAGNOSIS — R400 Somnolence: Secondary | ICD-10-CM | POA: Diagnosis not present

## 2023-01-29 NOTE — Progress Notes (Unsigned)
Location:  [facility] Room Number: HA/[id number] Place of Service:  SNF (31)   CODE STATUS: DNR  Allergies  Allergen Reactions   Hydrochlorothiazide     05/29/2021 K+ 2.6 Other reaction(s): Other (See Comments) 05/29/2021 K+ 2.6   Codeine Nausea Only    Chief Complaint  Patient presents with   Medical Management of Chronic Issues              Somnolence post stroke:  HOCM (hypertrophic obstructive cardiomyopathy) Dysphagia:  Stage 3a chronic kidney disease     HPI:  She is a 89 year old long term resident of this facility being seen for the management of her chronic illnesses:  Somnolence post stroke:  HOCM (hypertrophic obstructive cardiomyopathy) Dysphagia:  Stage 3a chronic kidney disease. She does get out of bed daily.   Past Medical History:  Diagnosis Date   Decreased sense of smell    and taste-negative CT scan-2011   Depression    Essential hypertension    GERD (gastroesophageal reflux disease)    Heart murmur    heard first time 4/12   History of mammogram    2010 and she does not want to repeat them anymore   HOCM (hypertrophic obstructive cardiomyopathy)    a. Dx by echo 05/2014.   Hypothyroidism    IBS (irritable bowel syndrome)    Impaired fasting glucose    Low back pain    Lumbar radiculopathy    with negative MRI (1991)   Postmenopausal    with osteopenia, bisphosphonates 1/05-1/11, improved osteopenia 4/12-repeat planned late 2015   Shingles    2015   Thyroid nodule    Vitamin D deficiency    repleted on weekly vitamin D 16109 iu    Past Surgical History:  Procedure Laterality Date   ABDOMINAL HYSTERECTOMY     BREAST BIOPSY     x2   CATARACT EXTRACTION     removal with IOL bilateral   GLAUCOMA SURGERY     laser   skin cancer removal     THYROIDECTOMY     subtotal    Social History   Socioeconomic History   Marital status: Married    Spouse name: Not on file   Number of children: Not on file   Years of  education: Not on file   Highest education level: Not on file  Occupational History   Not on file  Tobacco Use   Smoking status: Never   Smokeless tobacco: Never  Vaping Use   Vaping Use: Never used  Substance and Sexual Activity   Alcohol use: No    Alcohol/week: 0.0 standard drinks of alcohol   Drug use: Never   Sexual activity: Not on file  Other Topics Concern   Not on file  Social History Narrative   Not on file   Social Determinants of Health   Financial Resource Strain: Not on file  Food Insecurity: Not on file  Transportation Needs: Not on file  Physical Activity: Not on file  Stress: Not on file  Social Connections: Not on file  Intimate Partner Violence: Not on file   Family History  Problem Relation Age of Onset   Breast cancer Mother    Hypertension Father    CVA Father    Heart failure Sister    CAD Sister    Kidney disease Sister    COPD Brother    Lung cancer Brother    CAD Brother    Diabetes Brother  Stroke Father    Heart attack Neg Hx       VITAL SIGNS BP 130/80   Pulse 68   Temp (!) 97.2 F (36.2 C)   Resp 18   Ht 5\' 2"  (1.575 m)   Wt 130 lb 9.6 oz (59.2 kg)   SpO2 96%   BMI 23.89 kg/m   Outpatient Encounter Medications as of 01/29/2023  Medication Sig   acetaminophen (TYLENOL) 325 MG tablet Take 650 mg by mouth every 6 (six) hours.   amantadine (SYMMETREL) 50 MG/5ML solution Take 50 mg by mouth 2 (two) times daily. With breakfast and lunch   aspirin EC 81 MG EC tablet Take 1 tablet (81 mg total) by mouth daily with breakfast. Swallow whole.   Cholecalciferol (VITAMIN D-3) 25 MCG (1000 UT) CAPS Take 1 capsule by mouth daily.   donepezil (ARICEPT) 5 MG tablet Take 1 tablet (5 mg total) by mouth at bedtime.   famotidine (PEPCID) 20 MG tablet Take 1 tablet (20 mg total) by mouth daily.   levothyroxine (SYNTHROID) 50 MCG tablet Take by mouth daily before breakfast.   memantine (NAMENDA XR) 28 MG CP24 24 hr capsule Take 28 mg by mouth  daily.   Menthol, Topical Analgesic, (BIOFREEZE) 4 % GEL Apply 1 Application topically 3 (three) times daily as needed. Left shoulder pain   NON FORMULARY Diet:  Regular, thin liquids- Add prune juice to Breakfast tray   oxymetazoline (AFRIN) 0.05 % nasal spray Place 1 spray into both nostrils as needed. epistaxis   polyethylene glycol (MIRALAX / GLYCOLAX) 17 g packet Take 17 g by mouth daily.   sertraline (ZOLOFT) 25 MG tablet Take 25 mg by mouth daily.  to be given with 50mg  to = 75mg    sertraline (ZOLOFT) 50 MG tablet Take 50 mg by mouth daily. to be given with 25mg  to = 75mg    sodium chloride (OCEAN) 0.65 % SOLN nasal spray Place 1 spray into both nostrils 3 (three) times daily. And as needed   UNABLE TO FIND Med Name: Medpass 120 mL twice daily due to weight loss following acute illness   vitamin C (ASCORBIC ACID) 500 MG tablet Take 500 mg by mouth daily.   No facility-administered encounter medications on file as of 01/29/2023.     SIGNIFICANT DIAGNOSTIC EXAMS  PREVIOUS   08-09-22: chest x-ray: Mild prominent interstitial densities bilateral compatible with pneumonitis. Mild cardiomegaly; mild osteopenia; mild osteoarthritis.   NO NEW EXAMS   LABS REVIEWED PREVIOUS   02-12-22: wbc 7.8; hgb 14.8; hct 44.7; mcv 89.2 plt 221; glucose 180; bun 24; creat 0.89; k+ 4.0; na++ 141; ca 9.0; GFR 56; protein 6.6; albumin 3.6; hgb a1c 5.5; tsh 2.305 free t4: 0.94; vitamin B12: 2092; vitamin B6: 72.2 vitamin D 24.4   08-09-22: wbc 9.7; hgb 14.3; hct 43.2; mcv 89.6 plt 188; glucose 133; bun 26; creat 0.98; k+ 3.5; na++ 138; ca 8.7; gfr 55; ddimer: 0.95 crp 3.8 08-13-22: TSH 0.179; vitamin D 35.44  09-13-22: tsh 2.261 free t4: 0.95 11-20-22: urine culture: klebsiella pneumoniae   NO NEW LABS.   Review of Systems  Constitutional:  Negative for malaise/fatigue.  Respiratory:  Negative for cough and shortness of breath.   Cardiovascular:  Negative for chest pain, palpitations and leg swelling.   Gastrointestinal:  Negative for abdominal pain, constipation and heartburn.  Musculoskeletal:  Negative for back pain, joint pain and myalgias.  Skin: Negative.   Neurological:  Negative for dizziness.  Psychiatric/Behavioral:  The patient is  not nervous/anxious.    Physical Exam Constitutional:      General: She is not in acute distress.    Appearance: She is well-developed. She is not diaphoretic.  Neck:     Thyroid: No thyromegaly.  Cardiovascular:     Rate and Rhythm: Normal rate and regular rhythm.     Pulses: Normal pulses.     Heart sounds: Normal heart sounds.  Pulmonary:     Effort: Pulmonary effort is normal. No respiratory distress.     Breath sounds: Normal breath sounds.  Abdominal:     General: Bowel sounds are normal. There is no distension.     Palpations: Abdomen is soft.     Tenderness: There is no abdominal tenderness.  Musculoskeletal:     Cervical back: Neck supple.     Right lower leg: No edema.     Left lower leg: No edema.     Comments: Left hemiplegia   Lymphadenopathy:     Cervical: No cervical adenopathy.  Skin:    General: Skin is warm and dry.  Neurological:     Mental Status: She is alert. Mental status is at baseline.  Psychiatric:        Mood and Affect: Mood normal.        ASSESSMENT/ PLAN:  TODAY  Somnolence post stroke: is on amantadine 50 mg twice daily   2. HOCM (hypertrophic obstructive cardiomyopathy) will monitor  3. Dysphagia: no indications of aspiration present; is on thin liquids   4. Stage 3a chronic kidney disease bun 24; creat 0.8 gfr >60    PREVIOUS   5. Essential hypertension: 130/0: at this time will not make changes; if her readings remain elevated will make changes at that time.    6. Interstitial lung disease: is stable.   7. Vascular dementia without behavioral disturbance: weight is 130 pounds; will continue aricept 5 mg nightly and namenda xr 28 mg daily   8. Hypoalbuminemia due to protein calorie  malnutrition; albumin 3.4 will monitor    9. Vitamin B 12: 2092 is off supplement  10. Postoperative hypothyroidism:   tsh 2.261 free t4: 0.95 will continue synthroid 50 mcg daily   11. Vitamin D deficiency: is 24.4 will continue 1,000 units daily   12. Mixed hyperlipidemia; is off statin due to advanced age  89. Psychosis in elderly without behavioral disturbance is off seroquel  14. Depression recurrent: will continue zoloft 75 mg daily   15. GERD without esophagitis: will continue pepcid 20 mg daily   16. Chronic constipation: will continue miralax every other day  17. Thoracic aortic atherosclerosis (Cxr 12-15-14) is not on statin due to advanced age.   18. History of CVA/ left hemiplegia: is on asa 81 mg daily    Will check cbc; cmp; tsh; hgb A1c; vitamin 6; b12; d     Synthia Innocenteborah Jashun Puertas NP [facility]   call [phone]

## 2023-01-31 ENCOUNTER — Other Ambulatory Visit (HOSPITAL_COMMUNITY)
Admission: RE | Admit: 2023-01-31 | Discharge: 2023-01-31 | Disposition: A | Discharge status: Home / Self Care | Payer: Medicare Other | Source: Skilled Nursing Facility | Attending: Adult Health | Admitting: Adult Health

## 2023-01-31 DIAGNOSIS — I129 Hypertensive chronic kidney disease with stage 1 through stage 4 chronic kidney disease, or unspecified chronic kidney disease: Secondary | ICD-10-CM | POA: Diagnosis not present

## 2023-01-31 DIAGNOSIS — E1122 Type 2 diabetes mellitus with diabetic chronic kidney disease: Secondary | ICD-10-CM | POA: Insufficient documentation

## 2023-01-31 DIAGNOSIS — N1831 Chronic kidney disease, stage 3a: Secondary | ICD-10-CM | POA: Diagnosis present

## 2023-01-31 LAB — VITAMIN D 25 HYDROXY (VIT D DEFICIENCY, FRACTURES): Vit D, 25-Hydroxy: 37.78 ng/mL (ref 30–100)

## 2023-01-31 LAB — HEMOGLOBIN A1C
Hgb A1c MFr Bld: 5.6 % (ref 4.8–5.6)
Mean Plasma Glucose: 114.02 mg/dL

## 2023-01-31 LAB — CBC
HCT: 40.9 % (ref 36.0–46.0)
Hemoglobin: 13.4 g/dL (ref 12.0–15.0)
MCH: 30.4 pg (ref 26.0–34.0)
MCHC: 32.8 g/dL (ref 30.0–36.0)
MCV: 92.7 fL (ref 80.0–100.0)
Platelets: 208 10*3/uL (ref 150–400)
RBC: 4.41 MIL/uL (ref 3.87–5.11)
RDW: 13.7 % (ref 11.5–15.5)
WBC: 8.6 10*3/uL (ref 4.0–10.5)
nRBC: 0 % (ref 0.0–0.2)

## 2023-01-31 LAB — COMPREHENSIVE METABOLIC PANEL
ALT: 12 U/L (ref 0–44)
AST: 13 U/L — ABNORMAL LOW (ref 15–41)
Albumin: 3.4 g/dL — ABNORMAL LOW (ref 3.5–5.0)
Alkaline Phosphatase: 41 U/L (ref 38–126)
Anion gap: 5 (ref 5–15)
BUN: 20 mg/dL (ref 8–23)
CO2: 26 mmol/L (ref 22–32)
Calcium: 8.7 mg/dL — ABNORMAL LOW (ref 8.9–10.3)
Chloride: 107 mmol/L (ref 98–111)
Creatinine, Ser: 0.87 mg/dL (ref 0.44–1.00)
GFR, Estimated: >60 mL/min (ref >60)
Glucose, Bld: 95 mg/dL (ref 70–99)
Potassium: 3.3 mmol/L — ABNORMAL LOW (ref 3.5–5.1)
Sodium: 138 mmol/L (ref 135–145)
Total Bilirubin: 0.7 mg/dL (ref 0.3–1.2)
Total Protein: 6.1 g/dL — ABNORMAL LOW (ref 6.5–8.1)

## 2023-01-31 LAB — TSH: TSH: 2.505 u[IU]/mL (ref 0.350–4.500)

## 2023-01-31 LAB — VITAMIN B12: Vitamin B-12: 207 pg/mL (ref 180–914)

## 2023-02-03 LAB — VITAMIN B6: Vitamin B6: 4.4 ug/L (ref 3.4–65.2)

## 2023-02-24 IMAGING — CT CT HEAD CODE STROKE
3 series · 16 of 47 positions shown, 19 images · non-contrast
Comparison: Head CT 11/03/2020.

CLINICAL DATA: Code stroke. Neuro deficit, acute, stroke suspected.
Additional history provided: Left-sided weakness, last known well 1
hour prior to arrival.

EXAM:
CT HEAD WITHOUT CONTRAST
TECHNIQUE: Contiguous axial images were obtained from the base of the skull
through the vertex without intravenous contrast.

[Series 3: head w o · axial · 0.53mm/px · z∈[-127,+13]mm · 10 of 34 slices shown, 13 images]
[im 3/34  brain]
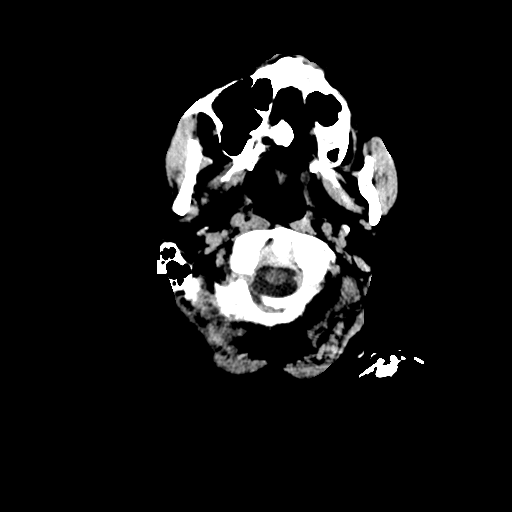
[im 3/34  bone]
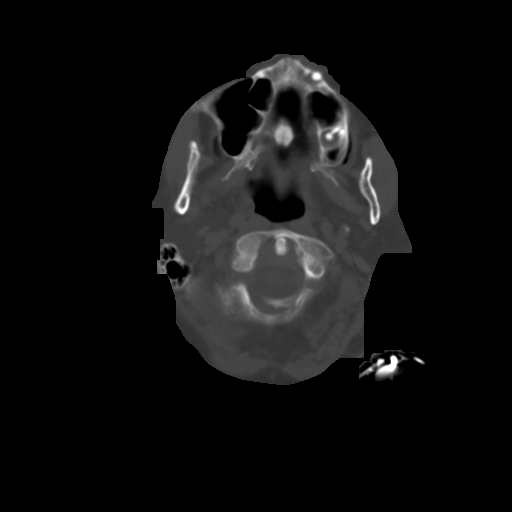
[im 6/34  brain]
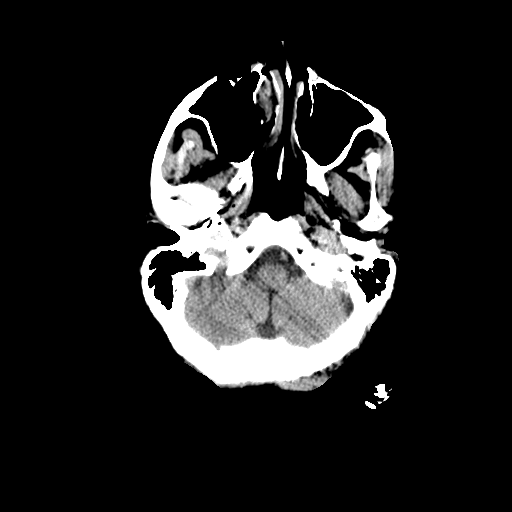
[im 10/34  brain]
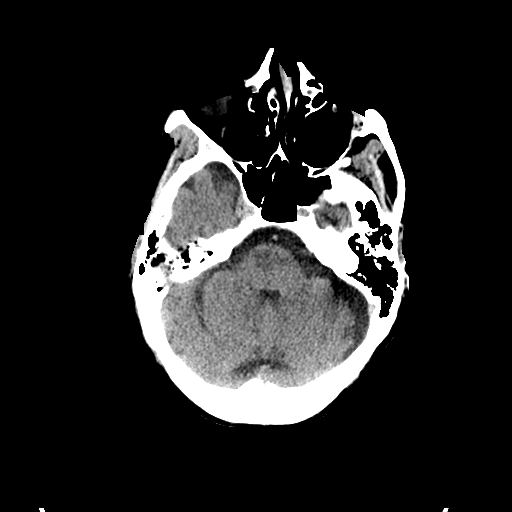
[im 12/34  brain]
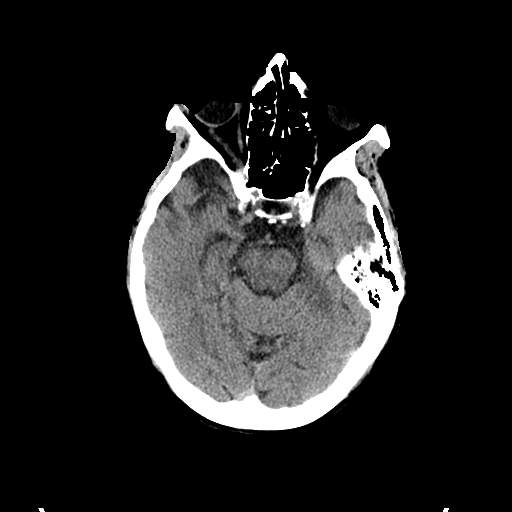
[im 15/34  brain]
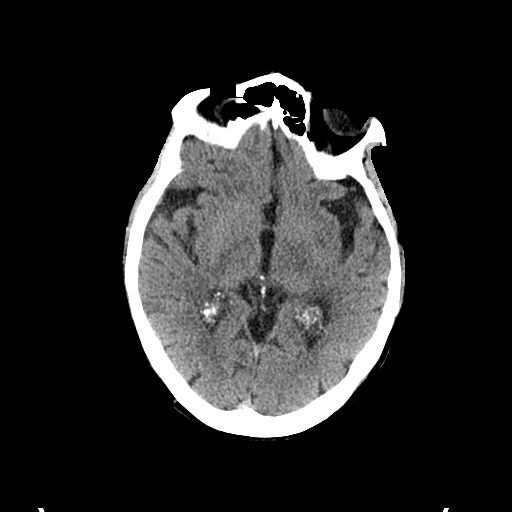
[im 15/34  bone]
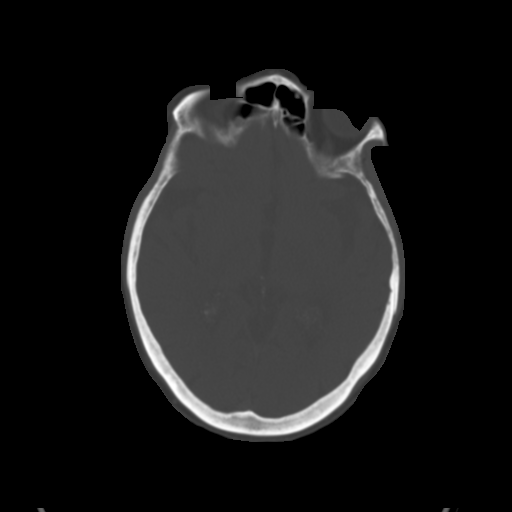
[im 19/34  brain]
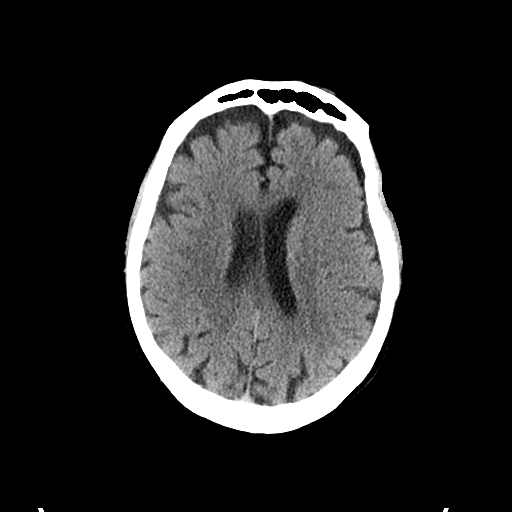
[im 22/34  brain]
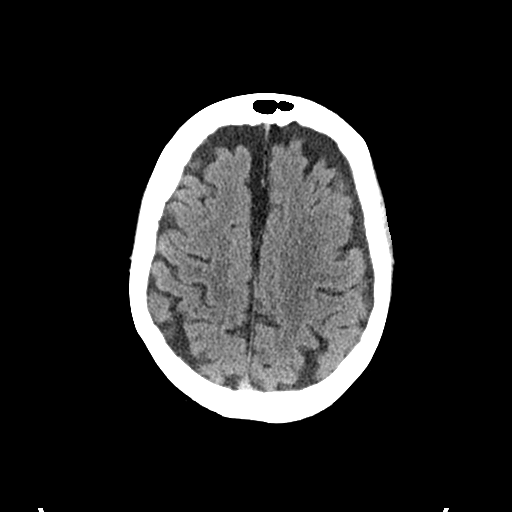
[im 26/34  brain]
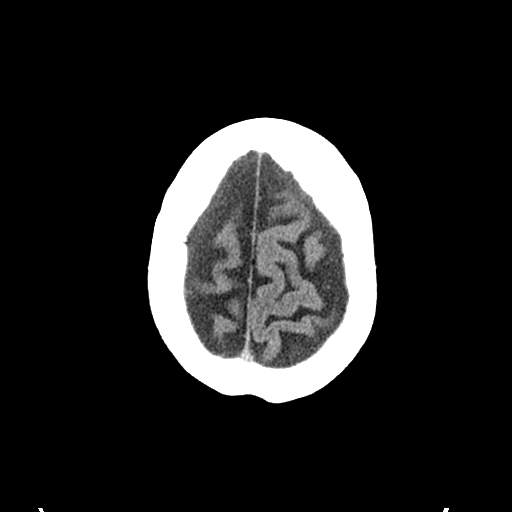
[im 28/34  brain]
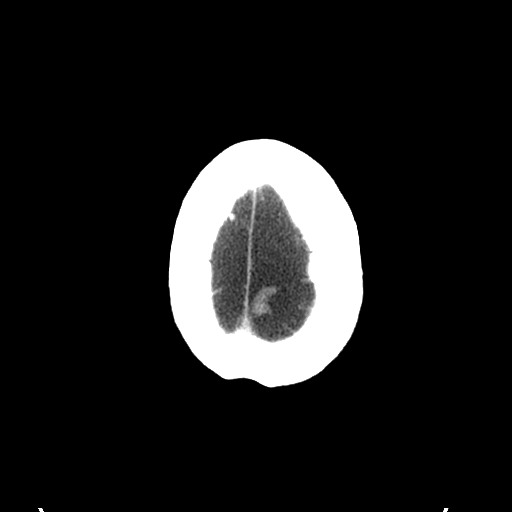
[im 28/34  bone]
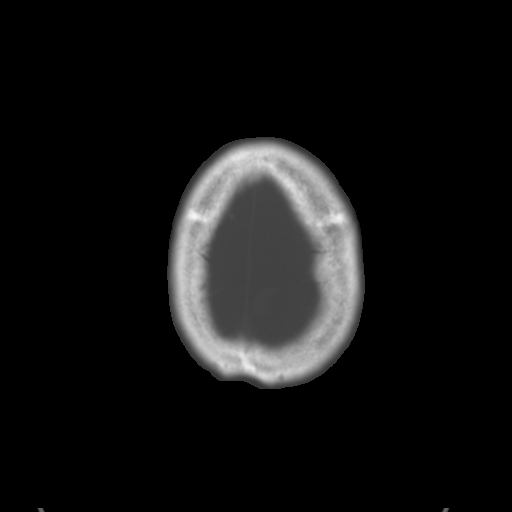
[im 31/34  brain]
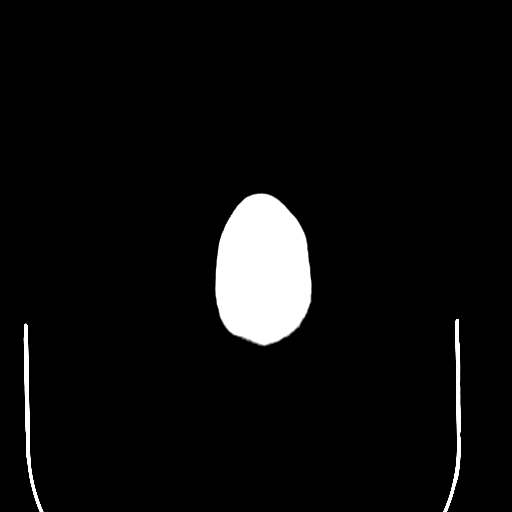

[Series 5: coronal soft · coronal · 0.36mm/px · 3 of 72 slices shown]
[im 24/72  brain]
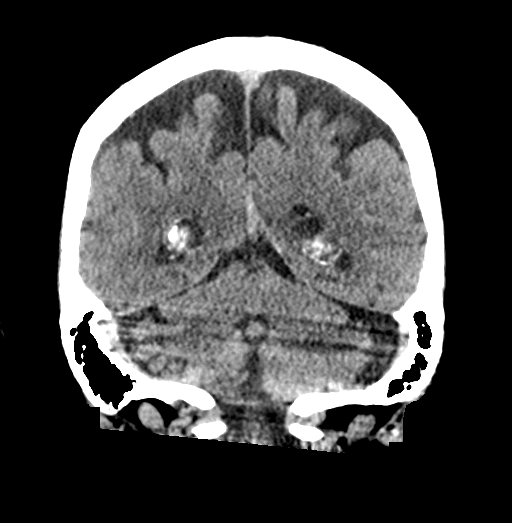
[im 32/72  brain]
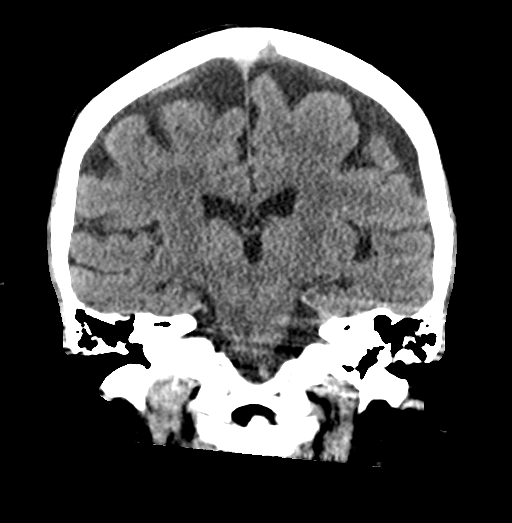
[im 40/72  brain]
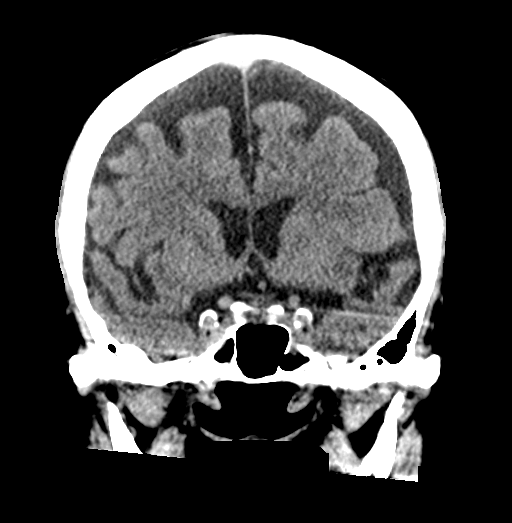

[Series 6: sagittal soft · sagittal · 0.37mm/px · 3 of 61 slices shown]
[im 21/61  brain]
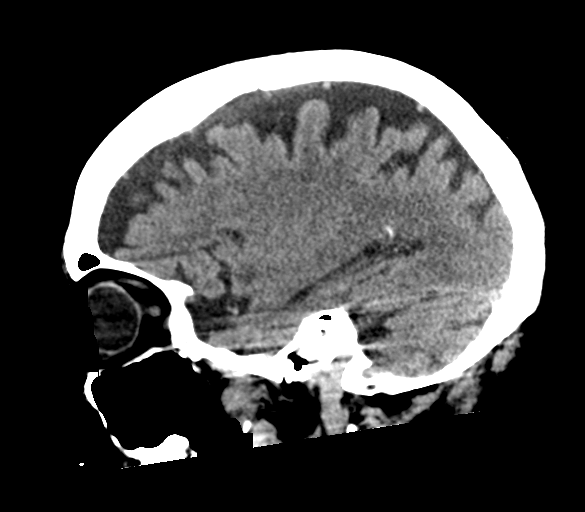
[im 31/61  brain]
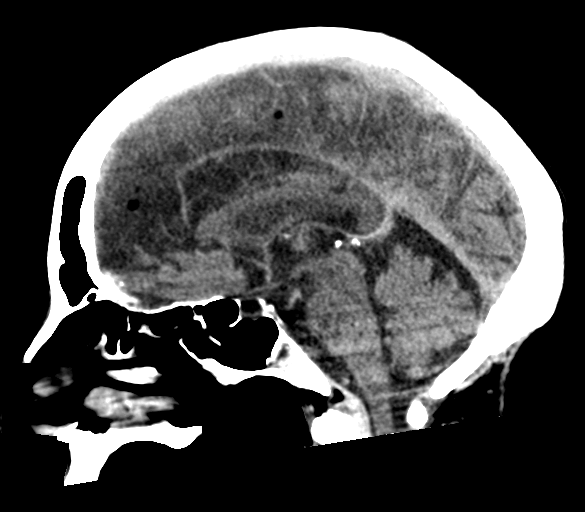
[im 41/61  brain]
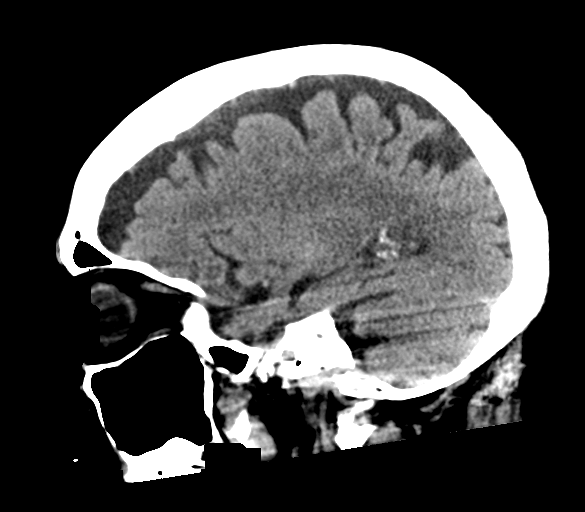

[16 of 47 positions shown; findings below may reference images not displayed]

FINDINGS: Brain:

Mild generalized cerebral atrophy.

There is no acute intracranial hemorrhage.

No demarcated cortical infarct.

No extra-axial fluid collection.

No evidence of an intracranial mass.

No midline shift.

Vascular: No hyperdense vessel.  Atherosclerotic calcifications

Skull: Normal. Negative for fracture or focal lesion.

Sinuses/Orbits: Leftward gaze. Mild mucosal thickening within the
left ethmoid air cells. Small volume frothy secretions within the
right sphenoid sinus.

ASPECTS (Alberta Stroke Program Early CT Score)

- Ganglionic level infarction (caudate, lentiform nuclei, internal
capsule, insula, M1-M3 cortex): 7

- Supraganglionic infarction (M4-M6 cortex): 3

Total score (0-10 with 10 being normal): 10

These results were called by telephone at the time of interpretation
on 05/29/2021 at [DATE] to provider RUSTY ISHAK , who verbally
acknowledged these results.
IMPRESSION: No evidence of acute intracranial abnormality. ASPECTS is 10.

Mild generalized cerebral atrophy.

Paranasal sinus disease at the imaged levels, as described.

## 2023-02-28 IMAGING — DX DG CHEST 1V PORT
1 series · 1 of 1 positions shown · non-contrast
Comparison: 01/24/2015

CLINICAL DATA: Aspiration pneumonia

EXAM:
PORTABLE CHEST 1 VIEW

[chest]
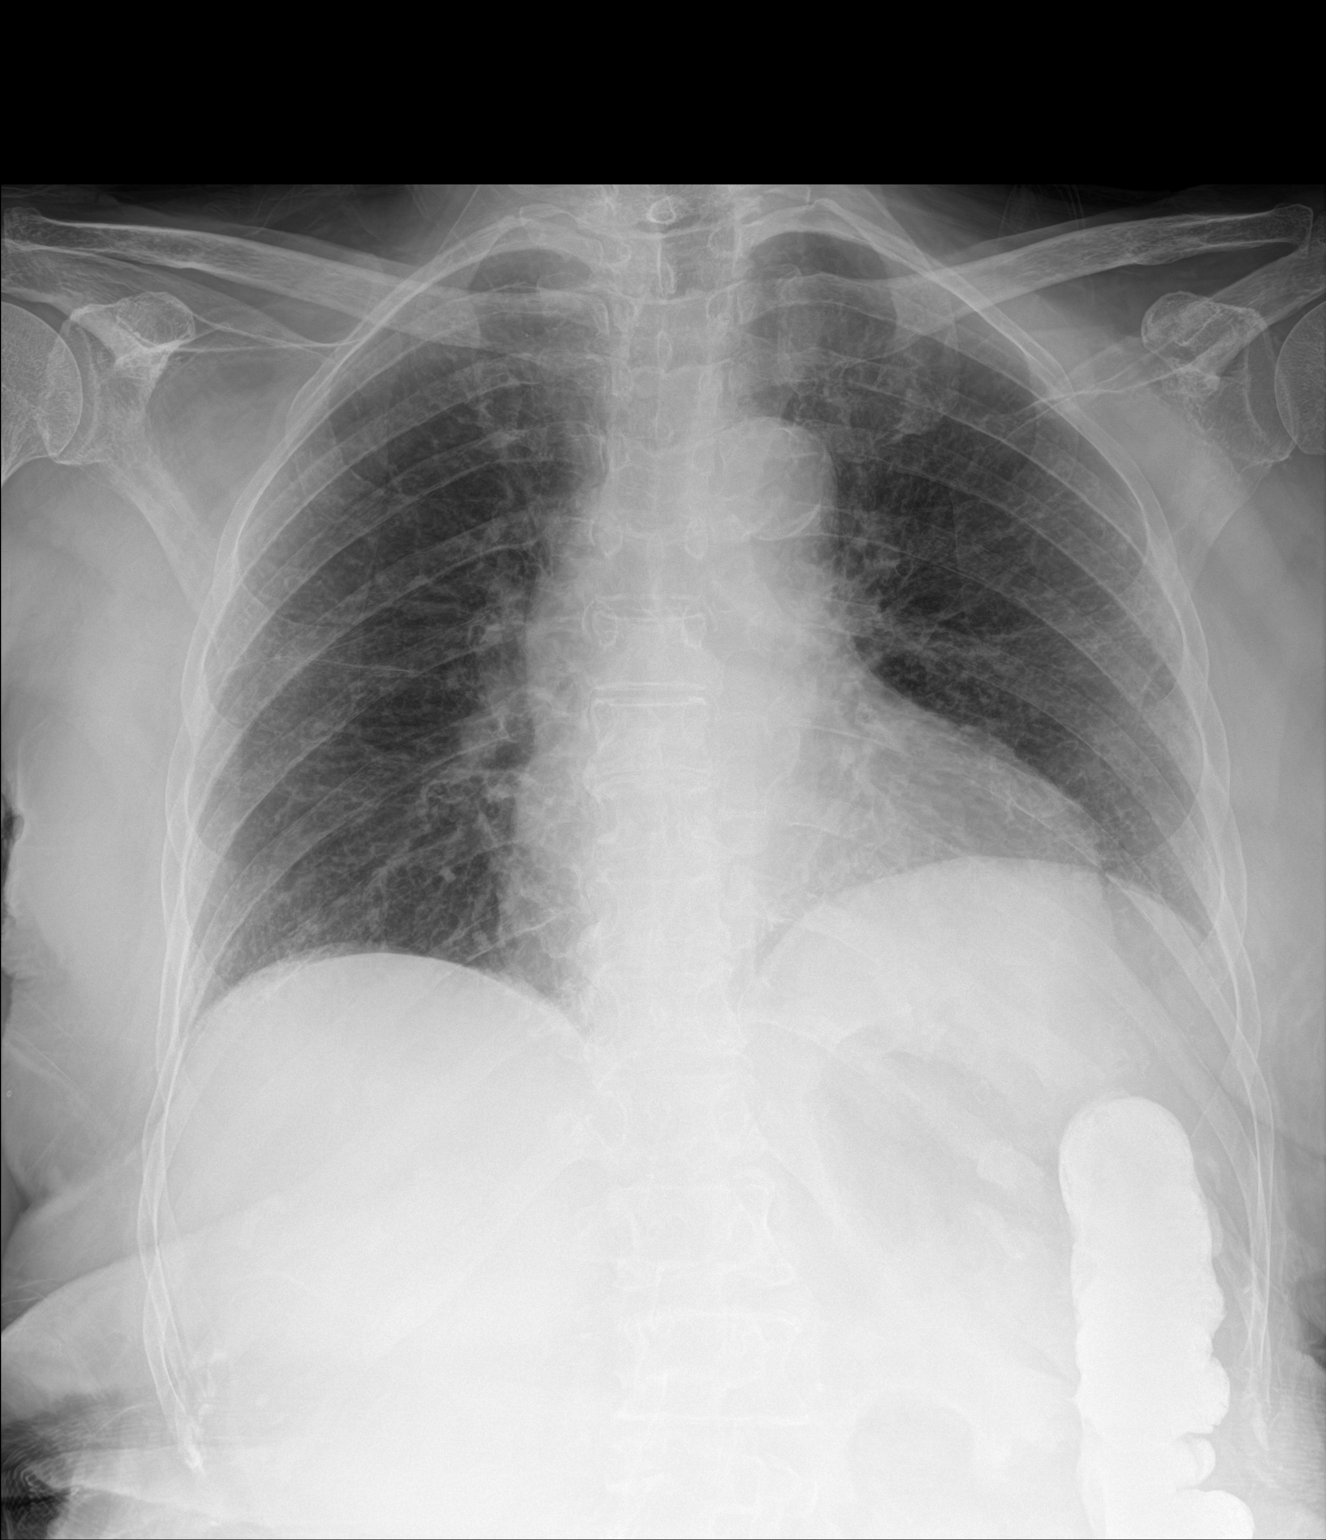

[1 of 1 positions shown; findings below may reference images not displayed]

FINDINGS: The heart size and mediastinal contours are within normal limits.
Atherosclerotic calcification of the aortic knob. Chronically
coarsened interstitial markings bilaterally. No focal airspace
consolidation, pleural effusion, or pneumothorax. Oral contrast is
visualized within the colon. The visualized skeletal structures are
unremarkable.
IMPRESSION: No active disease.

## 2023-03-07 ENCOUNTER — Non-Acute Institutional Stay (SKILLED_NURSING_FACILITY): Payer: Medicare Other | Admitting: Adult Health

## 2023-03-07 ENCOUNTER — Encounter: Payer: Self-pay | Admitting: Adult Health

## 2023-03-07 DIAGNOSIS — I10 Essential (primary) hypertension: Secondary | ICD-10-CM | POA: Diagnosis not present

## 2023-03-07 DIAGNOSIS — F0150 Vascular dementia without behavioral disturbance: Secondary | ICD-10-CM | POA: Diagnosis not present

## 2023-03-07 DIAGNOSIS — E8809 Other disorders of plasma-protein metabolism, not elsewhere classified: Secondary | ICD-10-CM | POA: Diagnosis not present

## 2023-03-07 DIAGNOSIS — J849 Interstitial pulmonary disease, unspecified: Secondary | ICD-10-CM

## 2023-03-07 DIAGNOSIS — E46 Unspecified protein-calorie malnutrition: Secondary | ICD-10-CM

## 2023-03-07 NOTE — Progress Notes (Signed)
Location:  [facility] Room Number: [id number] Place of Service:  SNF (31)   CODE STATUS: dnr  Allergies  Allergen Reactions   Hydrochlorothiazide     05/29/2021 K+ 2.6 Other reaction(s): Other (See Comments) 05/29/2021 K+ 2.6   Codeine Nausea Only    Chief Complaint  Patient presents with   Medical Management of Chronic Issues              Essential hypertension:  Interstitial lung disease: Vascular dementia without behavioral disturbance:  Hypoalbuminemia due to protein calorie malnutrition     HPI:  She is a 89 year old long term resident of this facility being seen for the management of her chronic illnesses; Essential hypertension:  Interstitial lung disease: Vascular dementia without behavioral disturbance:  Hypoalbuminemia due to protein calorie malnutrition. There are no reports of uncontrolled pain. Her weight is stable. No reports of shortness of breath present.   Past Medical History:  Diagnosis Date   Decreased sense of smell    and taste-negative CT scan-2011   Depression    Essential hypertension    GERD (gastroesophageal reflux disease)    Heart murmur    heard first time 4/12   History of mammogram    2010 and she does not want to repeat them anymore   HOCM (hypertrophic obstructive cardiomyopathy) (HCC)    a. Dx by echo 05/2014.   Hypothyroidism    IBS (irritable bowel syndrome)    Impaired fasting glucose    Low back pain    Lumbar radiculopathy    with negative MRI (1991)   Postmenopausal    with osteopenia, bisphosphonates 1/05-1/11, improved osteopenia 4/12-repeat planned late 2015   Shingles    2015   Thyroid nodule    Vitamin D deficiency    repleted on weekly vitamin D 16109 iu    Past Surgical History:  Procedure Laterality Date   ABDOMINAL HYSTERECTOMY     BREAST BIOPSY     x2   CATARACT EXTRACTION     removal with IOL bilateral   GLAUCOMA SURGERY     laser   skin cancer removal     THYROIDECTOMY     subtotal     Social History   Socioeconomic History   Marital status: Married    Spouse name: Not on file   Number of children: Not on file   Years of education: Not on file   Highest education level: Not on file  Occupational History   Not on file  Tobacco Use   Smoking status: Never   Smokeless tobacco: Never  Vaping Use   Vaping Use: Never used  Substance and Sexual Activity   Alcohol use: No    Alcohol/week: 0.0 standard drinks of alcohol   Drug use: Never   Sexual activity: Not on file  Other Topics Concern   Not on file  Social History Narrative   Not on file   Social Determinants of Health   Financial Resource Strain: Not on file  Food Insecurity: Not on file  Transportation Needs: Not on file  Physical Activity: Not on file  Stress: Not on file  Social Connections: Not on file  Intimate Partner Violence: Not on file   Family History  Problem Relation Age of Onset   Breast cancer Mother    Hypertension Father    CVA Father    Heart failure Sister    CAD Sister    Kidney disease Sister  COPD Brother    Lung cancer Brother    CAD Brother    Diabetes Brother    Stroke Father    Heart attack Neg Hx       VITAL SIGNS BP 132/76   Pulse 86   Temp 97.8 F (36.6 C)   Resp 20   Ht 5\' 2"  (1.575 m)   Wt 134 lb 9.6 oz (61.1 kg)   SpO2 95%   BMI 24.62 kg/m   Outpatient Encounter Medications as of 03/07/2023  Medication Sig   cyanocobalamin 2000 MCG tablet Take 2,000 mcg by mouth daily.   acetaminophen (TYLENOL) 325 MG tablet Take 650 mg by mouth every 6 (six) hours.   amantadine (SYMMETREL) 50 MG/5ML solution Take 50 mg by mouth 2 (two) times daily. With breakfast and lunch   aspirin EC 81 MG EC tablet Take 1 tablet (81 mg total) by mouth daily with breakfast. Swallow whole.   Cholecalciferol (VITAMIN D-3) 25 MCG (1000 UT) CAPS Take 1 capsule by mouth daily.   donepezil (ARICEPT) 5 MG tablet Take 1 tablet (5 mg total) by mouth at bedtime.   famotidine  (PEPCID) 20 MG tablet Take 1 tablet (20 mg total) by mouth daily.   levothyroxine (SYNTHROID) 50 MCG tablet Take by mouth daily before breakfast.   memantine (NAMENDA XR) 28 MG CP24 24 hr capsule Take 28 mg by mouth daily.   Menthol, Topical Analgesic, (BIOFREEZE) 4 % GEL Apply 1 Application topically 3 (three) times daily as needed. Left shoulder pain   NON FORMULARY Diet:  Regular, thin liquids- Add prune juice to Breakfast tray   oxymetazoline (AFRIN) 0.05 % nasal spray Place 1 spray into both nostrils as needed. epistaxis   polyethylene glycol (MIRALAX / GLYCOLAX) 17 g packet Take 17 g by mouth daily.   sertraline (ZOLOFT) 25 MG tablet Take 25 mg by mouth daily.  to be given with 50mg  to = 75mg    sertraline (ZOLOFT) 50 MG tablet Take 50 mg by mouth daily. to be given with 25mg  to = 75mg    sodium chloride (OCEAN) 0.65 % SOLN nasal spray Place 1 spray into both nostrils 3 (three) times daily. And as needed   UNABLE TO FIND Med Name: Medpass 120 mL twice daily due to weight loss following acute illness   vitamin C (ASCORBIC ACID) 500 MG tablet Take 500 mg by mouth daily.   No facility-administered encounter medications on file as of 03/07/2023.     SIGNIFICANT DIAGNOSTIC EXAMS  PREVIOUS   08-09-22: chest x-ray: Mild prominent interstitial densities bilateral compatible with pneumonitis. Mild cardiomegaly; mild osteopenia; mild osteoarthritis.   NO NEW EXAMS   LABS REVIEWED PREVIOUS     08-09-22: wbc 9.7; hgb 14.3; hct 43.2; mcv 89.6 plt 188; glucose 133; bun 26; creat 0.98; k+ 3.5; na++ 138; ca 8.7; gfr 55; ddimer: 0.95 crp 3.8 08-13-22: TSH 0.179; vitamin D 35.44  09-13-22: tsh 2.261 free t4: 0.95 11-20-22: urine culture: klebsiella pneumoniae   TODAY  01-31-23: wbc 8.6; hgb 13.4; hct 40.9; mcv 92.7 plt 208; glucose 95; bun 20; creat 0.87; k+ 3.3; na++ 138; ca 8.7; gfr >60; protein 6.1; albumin 3.4 hgb A1c 5.6; tsh 2.505; vitamin D 37.78; vitamin B 12: 207 vitamin B 6: 4.4  Review  of Systems  Constitutional:  Negative for malaise/fatigue.  Respiratory:  Negative for cough and shortness of breath.   Cardiovascular:  Negative for chest pain, palpitations and leg swelling.  Gastrointestinal:  Negative for abdominal pain, constipation  and heartburn.  Musculoskeletal:  Negative for back pain, joint pain and myalgias.  Skin: Negative.   Neurological:  Negative for dizziness.  Psychiatric/Behavioral:  The patient is not nervous/anxious.    Physical Exam Constitutional:      General: She is not in acute distress.    Appearance: She is well-developed. She is not diaphoretic.  Neck:     Thyroid: No thyromegaly.  Cardiovascular:     Rate and Rhythm: Normal rate and regular rhythm.     Pulses: Normal pulses.     Heart sounds: Normal heart sounds.  Pulmonary:     Effort: Pulmonary effort is normal. No respiratory distress.     Breath sounds: Normal breath sounds.  Abdominal:     General: Bowel sounds are normal. There is no distension.     Palpations: Abdomen is soft.     Tenderness: There is no abdominal tenderness.  Musculoskeletal:     Cervical back: Neck supple.     Right lower leg: No edema.     Left lower leg: No edema.     Comments: Left hemiplegia   Lymphadenopathy:     Cervical: No cervical adenopathy.  Skin:    General: Skin is warm and dry.  Neurological:     Mental Status: She is alert. Mental status is at baseline.  Psychiatric:        Mood and Affect: Mood normal.         ASSESSMENT/ PLAN:  TODAY  Essential hypertension: b/p 132/76 at this time will continue to monitor her status.   2. Interstitial lung disease: is stable  3. Vascular dementia without behavioral disturbance: weight is 134 pounds; will continue aricept 5 mg nightly and namenda xr 28 mg daily   4. Hypoalbuminemia due to protein calorie malnutrition: albumin 3.4    PREVIOUS   5. Vitamin B 12: 2092 is off supplement  6. Postoperative hypothyroidism:   tsh 2.505  will  continue synthroid 50 mcg daily   7. Vitamin D deficiency: is 37.78 will continue 1,000 units daily   8. Mixed hyperlipidemia; is off statin due to advanced age  56. Psychosis in elderly without behavioral disturbance is off seroquel  10. Depression recurrent: will continue zoloft 75 mg daily   11. GERD without esophagitis: will continue pepcid 20 mg daily   12. Chronic constipation: will continue miralax every other day  13. Thoracic aortic atherosclerosis (Cxr 12-15-14) is not on statin due to advanced age.   14. History of CVA/ left hemiplegia: is on asa 81 mg daily   15. Depression recurrent: will continue zoloft 75 mg daily  16: vitamin B 12: deficiency: level 207 will continue 2,000 mcg daily   17. Somnolence post stroke: is on amantadine 50 mg twice daily   18. HOCM (hypertrophic obstructive cardiomyopathy) will monitor  19. Dysphagia: no indications of aspiration present; is on thin liquids   20. Stage 3a chronic kidney disease bun 20; creat 0.87 gfr >60       Synthia Innocent NP [facility]  call [phone]

## 2023-03-14 ENCOUNTER — Inpatient Hospital Stay (HOSPITAL_COMMUNITY)
Admission: EM | Admit: 2023-03-14 | Discharge: 2023-03-17 | DRG: 378 | Disposition: A | Discharge status: Skilled Nursing Facility | Payer: Medicare Other | Source: Skilled Nursing Facility | Attending: Family Medicine | Admitting: Family Medicine

## 2023-03-14 ENCOUNTER — Encounter (HOSPITAL_COMMUNITY): Payer: Self-pay | Admitting: Emergency Medicine

## 2023-03-14 ENCOUNTER — Other Ambulatory Visit: Payer: Self-pay

## 2023-03-14 ENCOUNTER — Other Ambulatory Visit (HOSPITAL_COMMUNITY)
Admission: RE | Admit: 2023-03-14 | Discharge: 2023-03-14 | Disposition: A | Discharge status: Home / Self Care | Payer: Medicare Other | Source: Skilled Nursing Facility | Attending: Adult Health | Admitting: Adult Health

## 2023-03-14 ENCOUNTER — Non-Acute Institutional Stay (SKILLED_NURSING_FACILITY): Payer: Medicare Other | Admitting: Adult Health

## 2023-03-14 ENCOUNTER — Encounter: Payer: Self-pay | Admitting: Adult Health

## 2023-03-14 ENCOUNTER — Emergency Department (HOSPITAL_COMMUNITY): Payer: Medicare Other

## 2023-03-14 DIAGNOSIS — Z888 Allergy status to other drugs, medicaments and biological substances status: Secondary | ICD-10-CM

## 2023-03-14 DIAGNOSIS — K581 Irritable bowel syndrome with constipation: Secondary | ICD-10-CM | POA: Diagnosis present

## 2023-03-14 DIAGNOSIS — Z9841 Cataract extraction status, right eye: Secondary | ICD-10-CM

## 2023-03-14 DIAGNOSIS — Z961 Presence of intraocular lens: Secondary | ICD-10-CM | POA: Diagnosis present

## 2023-03-14 DIAGNOSIS — E860 Dehydration: Secondary | ICD-10-CM | POA: Diagnosis present

## 2023-03-14 DIAGNOSIS — E039 Hypothyroidism, unspecified: Secondary | ICD-10-CM | POA: Diagnosis present

## 2023-03-14 DIAGNOSIS — N179 Acute kidney failure, unspecified: Secondary | ICD-10-CM | POA: Diagnosis not present

## 2023-03-14 DIAGNOSIS — E538 Deficiency of other specified B group vitamins: Secondary | ICD-10-CM | POA: Diagnosis present

## 2023-03-14 DIAGNOSIS — D72829 Elevated white blood cell count, unspecified: Secondary | ICD-10-CM | POA: Diagnosis not present

## 2023-03-14 DIAGNOSIS — Z841 Family history of disorders of kidney and ureter: Secondary | ICD-10-CM

## 2023-03-14 DIAGNOSIS — K625 Hemorrhage of anus and rectum: Secondary | ICD-10-CM | POA: Diagnosis not present

## 2023-03-14 DIAGNOSIS — E876 Hypokalemia: Secondary | ICD-10-CM | POA: Insufficient documentation

## 2023-03-14 DIAGNOSIS — Z79890 Hormone replacement therapy (postmenopausal): Secondary | ICD-10-CM

## 2023-03-14 DIAGNOSIS — Z66 Do not resuscitate: Secondary | ICD-10-CM | POA: Diagnosis present

## 2023-03-14 DIAGNOSIS — E875 Hyperkalemia: Secondary | ICD-10-CM

## 2023-03-14 DIAGNOSIS — Z9842 Cataract extraction status, left eye: Secondary | ICD-10-CM

## 2023-03-14 DIAGNOSIS — F32A Depression, unspecified: Secondary | ICD-10-CM | POA: Diagnosis present

## 2023-03-14 DIAGNOSIS — Z803 Family history of malignant neoplasm of breast: Secondary | ICD-10-CM

## 2023-03-14 DIAGNOSIS — Z8249 Family history of ischemic heart disease and other diseases of the circulatory system: Secondary | ICD-10-CM

## 2023-03-14 DIAGNOSIS — G9340 Encephalopathy, unspecified: Secondary | ICD-10-CM | POA: Diagnosis present

## 2023-03-14 DIAGNOSIS — Z825 Family history of asthma and other chronic lower respiratory diseases: Secondary | ICD-10-CM

## 2023-03-14 DIAGNOSIS — Z801 Family history of malignant neoplasm of trachea, bronchus and lung: Secondary | ICD-10-CM

## 2023-03-14 DIAGNOSIS — N1832 Chronic kidney disease, stage 3b: Secondary | ICD-10-CM | POA: Diagnosis not present

## 2023-03-14 DIAGNOSIS — N189 Chronic kidney disease, unspecified: Secondary | ICD-10-CM | POA: Diagnosis present

## 2023-03-14 DIAGNOSIS — D649 Anemia, unspecified: Secondary | ICD-10-CM | POA: Diagnosis present

## 2023-03-14 DIAGNOSIS — Z6824 Body mass index (BMI) 24.0-24.9, adult: Secondary | ICD-10-CM

## 2023-03-14 DIAGNOSIS — K922 Gastrointestinal hemorrhage, unspecified: Secondary | ICD-10-CM

## 2023-03-14 DIAGNOSIS — F0153 Vascular dementia, unspecified severity, with mood disturbance: Secondary | ICD-10-CM | POA: Diagnosis present

## 2023-03-14 DIAGNOSIS — Z1152 Encounter for screening for COVID-19: Secondary | ICD-10-CM

## 2023-03-14 DIAGNOSIS — I493 Ventricular premature depolarization: Secondary | ICD-10-CM | POA: Diagnosis present

## 2023-03-14 DIAGNOSIS — K644 Residual hemorrhoidal skin tags: Secondary | ICD-10-CM | POA: Diagnosis present

## 2023-03-14 DIAGNOSIS — F05 Delirium due to known physiological condition: Secondary | ICD-10-CM | POA: Diagnosis not present

## 2023-03-14 DIAGNOSIS — Z833 Family history of diabetes mellitus: Secondary | ICD-10-CM

## 2023-03-14 DIAGNOSIS — Z885 Allergy status to narcotic agent status: Secondary | ICD-10-CM

## 2023-03-14 DIAGNOSIS — I129 Hypertensive chronic kidney disease with stage 1 through stage 4 chronic kidney disease, or unspecified chronic kidney disease: Secondary | ICD-10-CM | POA: Diagnosis present

## 2023-03-14 DIAGNOSIS — Z823 Family history of stroke: Secondary | ICD-10-CM

## 2023-03-14 DIAGNOSIS — F0150 Vascular dementia without behavioral disturbance: Secondary | ICD-10-CM | POA: Diagnosis present

## 2023-03-14 DIAGNOSIS — Z7982 Long term (current) use of aspirin: Secondary | ICD-10-CM

## 2023-03-14 DIAGNOSIS — R54 Age-related physical debility: Secondary | ICD-10-CM | POA: Diagnosis present

## 2023-03-14 DIAGNOSIS — K5731 Diverticulosis of large intestine without perforation or abscess with bleeding: Secondary | ICD-10-CM | POA: Diagnosis not present

## 2023-03-14 DIAGNOSIS — R627 Adult failure to thrive: Secondary | ICD-10-CM | POA: Diagnosis present

## 2023-03-14 DIAGNOSIS — Z85828 Personal history of other malignant neoplasm of skin: Secondary | ICD-10-CM

## 2023-03-14 DIAGNOSIS — Z90710 Acquired absence of both cervix and uterus: Secondary | ICD-10-CM

## 2023-03-14 DIAGNOSIS — D62 Acute posthemorrhagic anemia: Secondary | ICD-10-CM | POA: Diagnosis present

## 2023-03-14 DIAGNOSIS — K219 Gastro-esophageal reflux disease without esophagitis: Secondary | ICD-10-CM | POA: Diagnosis present

## 2023-03-14 DIAGNOSIS — J849 Interstitial pulmonary disease, unspecified: Secondary | ICD-10-CM | POA: Diagnosis present

## 2023-03-14 DIAGNOSIS — I421 Obstructive hypertrophic cardiomyopathy: Secondary | ICD-10-CM | POA: Diagnosis present

## 2023-03-14 DIAGNOSIS — Z79899 Other long term (current) drug therapy: Secondary | ICD-10-CM

## 2023-03-14 DIAGNOSIS — I69354 Hemiplegia and hemiparesis following cerebral infarction affecting left non-dominant side: Secondary | ICD-10-CM

## 2023-03-14 DIAGNOSIS — K5909 Other constipation: Secondary | ICD-10-CM | POA: Diagnosis present

## 2023-03-14 LAB — CBC WITH DIFFERENTIAL/PLATELET
Abs Immature Granulocytes: 0.11 10*3/uL — ABNORMAL HIGH (ref 0.00–0.07)
Abs Immature Granulocytes: 0.08 10*3/uL — ABNORMAL HIGH (ref 0.00–0.07)
Basophils Absolute: 0 10*3/uL (ref 0.0–0.1)
Basophils Absolute: 0 10*3/uL (ref 0.0–0.1)
Basophils Relative: 0 %
Basophils Relative: 0 %
Eosinophils Absolute: 0 10*3/uL (ref 0.0–0.5)
Eosinophils Absolute: 0 10*3/uL (ref 0.0–0.5)
Eosinophils Relative: 0 %
Eosinophils Relative: 0 %
HCT: 28.1 % — ABNORMAL LOW (ref 36.0–46.0)
HCT: 26.3 % — ABNORMAL LOW (ref 36.0–46.0)
Hemoglobin: 9.5 g/dL — ABNORMAL LOW (ref 12.0–15.0)
Hemoglobin: 8.5 g/dL — ABNORMAL LOW (ref 12.0–15.0)
Immature Granulocytes: 1 %
Immature Granulocytes: 1 %
Lymphocytes Relative: 21 %
Lymphocytes Relative: 27 %
Lymphs Abs: 3.5 10*3/uL (ref 0.7–4.0)
Lymphs Abs: 4.4 10*3/uL — ABNORMAL HIGH (ref 0.7–4.0)
MCH: 32.6 pg (ref 26.0–34.0)
MCH: 31 pg (ref 26.0–34.0)
MCHC: 33.8 g/dL (ref 30.0–36.0)
MCHC: 32.3 g/dL (ref 30.0–36.0)
MCV: 96.6 fL (ref 80.0–100.0)
MCV: 96 fL (ref 80.0–100.0)
Monocytes Absolute: 1.5 10*3/uL — ABNORMAL HIGH (ref 0.1–1.0)
Monocytes Absolute: 1.9 10*3/uL — ABNORMAL HIGH (ref 0.1–1.0)
Monocytes Relative: 9 %
Monocytes Relative: 12 %
Neutro Abs: 11.6 10*3/uL — ABNORMAL HIGH (ref 1.7–7.7)
Neutro Abs: 9.7 10*3/uL — ABNORMAL HIGH (ref 1.7–7.7)
Neutrophils Relative %: 69 %
Neutrophils Relative %: 60 %
Platelets: 214 10*3/uL (ref 150–400)
Platelets: 205 10*3/uL (ref 150–400)
RBC: 2.91 MIL/uL — ABNORMAL LOW (ref 3.87–5.11)
RBC: 2.74 MIL/uL — ABNORMAL LOW (ref 3.87–5.11)
RDW: 13.8 % (ref 11.5–15.5)
RDW: 14.1 % (ref 11.5–15.5)
WBC: 16.7 10*3/uL — ABNORMAL HIGH (ref 4.0–10.5)
WBC: 16.1 10*3/uL — ABNORMAL HIGH (ref 4.0–10.5)
nRBC: 0 % (ref 0.0–0.2)
nRBC: 0 % (ref 0.0–0.2)

## 2023-03-14 LAB — COMPREHENSIVE METABOLIC PANEL
ALT: 12 U/L (ref 0–44)
ALT: 13 U/L (ref 0–44)
AST: 15 U/L (ref 15–41)
AST: 16 U/L (ref 15–41)
Albumin: 3.4 g/dL — ABNORMAL LOW (ref 3.5–5.0)
Albumin: 3.2 g/dL — ABNORMAL LOW (ref 3.5–5.0)
Alkaline Phosphatase: 34 U/L — ABNORMAL LOW (ref 38–126)
Alkaline Phosphatase: 35 U/L — ABNORMAL LOW (ref 38–126)
Anion gap: 9 (ref 5–15)
Anion gap: 7 (ref 5–15)
BUN: 34 mg/dL — ABNORMAL HIGH (ref 8–23)
BUN: 38 mg/dL — ABNORMAL HIGH (ref 8–23)
CO2: 28 mmol/L (ref 22–32)
CO2: 28 mmol/L (ref 22–32)
Calcium: 9.1 mg/dL (ref 8.9–10.3)
Calcium: 8.9 mg/dL (ref 8.9–10.3)
Chloride: 101 mmol/L (ref 98–111)
Chloride: 103 mmol/L (ref 98–111)
Creatinine, Ser: 1.36 mg/dL — ABNORMAL HIGH (ref 0.44–1.00)
Creatinine, Ser: 1.35 mg/dL — ABNORMAL HIGH (ref 0.44–1.00)
GFR, Estimated: 37 mL/min — ABNORMAL LOW (ref >60)
GFR, Estimated: 38 mL/min — ABNORMAL LOW (ref >60)
Glucose, Bld: 154 mg/dL — ABNORMAL HIGH (ref 70–99)
Glucose, Bld: 140 mg/dL — ABNORMAL HIGH (ref 70–99)
Potassium: 5.7 mmol/L — ABNORMAL HIGH (ref 3.5–5.1)
Potassium: 4.9 mmol/L (ref 3.5–5.1)
Sodium: 138 mmol/L (ref 135–145)
Sodium: 138 mmol/L (ref 135–145)
Total Bilirubin: 0.9 mg/dL (ref 0.3–1.2)
Total Bilirubin: 0.6 mg/dL (ref 0.3–1.2)
Total Protein: 6 g/dL — ABNORMAL LOW (ref 6.5–8.1)
Total Protein: 5.8 g/dL — ABNORMAL LOW (ref 6.5–8.1)

## 2023-03-14 LAB — CULTURE, BLOOD (ROUTINE X 2): Special Requests: ADEQUATE

## 2023-03-14 LAB — BPAM RBC
Blood Product Expiration Date: 202406212359
Unit Type and Rh: 5100

## 2023-03-14 LAB — PREPARE RBC (CROSSMATCH)

## 2023-03-14 LAB — URINALYSIS, ROUTINE W REFLEX MICROSCOPIC
Bilirubin Urine: NEGATIVE
Glucose, UA: NEGATIVE mg/dL
Hgb urine dipstick: NEGATIVE
Ketones, ur: NEGATIVE mg/dL
Leukocytes,Ua: NEGATIVE
Nitrite: NEGATIVE
Protein, ur: NEGATIVE mg/dL
Specific Gravity, Urine: 1.023 (ref 1.005–1.030)
pH: 5 (ref 5.0–8.0)

## 2023-03-14 LAB — TYPE AND SCREEN
ABO/RH(D): O POS
Unit division: 0

## 2023-03-14 LAB — CBC
HCT: 25.3 % — ABNORMAL LOW (ref 36.0–46.0)
Hemoglobin: 8 g/dL — ABNORMAL LOW (ref 12.0–15.0)
MCH: 31.4 pg (ref 26.0–34.0)
MCHC: 31.6 g/dL (ref 30.0–36.0)
MCV: 99.2 fL (ref 80.0–100.0)
Platelets: 164 10*3/uL (ref 150–400)
RBC: 2.55 MIL/uL — ABNORMAL LOW (ref 3.87–5.11)
RDW: 14 % (ref 11.5–15.5)
WBC: 11.7 10*3/uL — ABNORMAL HIGH (ref 4.0–10.5)
nRBC: 0 % (ref 0.0–0.2)

## 2023-03-14 LAB — HEMOGLOBIN AND HEMATOCRIT, BLOOD
HCT: 22.3 % — ABNORMAL LOW (ref 36.0–46.0)
Hemoglobin: 7.2 g/dL — ABNORMAL LOW (ref 12.0–15.0)

## 2023-03-14 LAB — PROTIME-INR
INR: 1.2 (ref 0.8–1.2)
Prothrombin Time: 15.2 seconds (ref 11.4–15.2)

## 2023-03-14 LAB — POC OCCULT BLOOD, ED: Fecal Occult Bld: POSITIVE — AB

## 2023-03-14 LAB — APTT: aPTT: 30 seconds (ref 24–36)

## 2023-03-14 LAB — ABO/RH: ABO/RH(D): O POS

## 2023-03-14 MED ORDER — SODIUM CHLORIDE 0.9 % IV BOLUS
1000.0000 mL | Freq: Once | INTRAVENOUS | Status: AC
  Administered 2023-03-14: 1000 mL via INTRAVENOUS

## 2023-03-14 MED ORDER — ACETAMINOPHEN 650 MG RE SUPP: 650.0000 mg | Freq: Four times a day (QID) | RECTAL | Status: DC | PRN

## 2023-03-14 MED ORDER — ALBUTEROL SULFATE (2.5 MG/3ML) 0.083% IN NEBU: 2.5000 mg | INHALATION_SOLUTION | RESPIRATORY_TRACT | Status: DC | PRN

## 2023-03-14 MED ORDER — LEVOTHYROXINE SODIUM 50 MCG PO TABS
50.0000 ug | ORAL_TABLET | Freq: Every day | ORAL | Status: DC
  Administered 2023-03-15 – 2023-03-17 (×3): 50 ug via ORAL
  Filled 2023-03-14 (×4): qty 1

## 2023-03-14 MED ORDER — FUROSEMIDE 10 MG/ML IJ SOLN
20.0000 mg | Freq: Once | INTRAMUSCULAR | Status: AC
  Administered 2023-03-15: 20 mg via INTRAVENOUS
  Filled 2023-03-14: qty 2

## 2023-03-14 MED ORDER — PHENYLEPHRINE-COCOA BUTTER 0.25-88.44 % RE SUPP: 1.0000 | RECTAL | Status: DC | PRN

## 2023-03-14 MED ORDER — SODIUM CHLORIDE 0.9 % IV BOLUS
500.0000 mL | Freq: Once | INTRAVENOUS | Status: AC
  Administered 2023-03-14: 500 mL via INTRAVENOUS

## 2023-03-14 MED ORDER — CYANOCOBALAMIN 1000 MCG/ML IJ SOLN
1000.0000 ug | Freq: Every day | INTRAMUSCULAR | Status: DC
  Administered 2023-03-14 – 2023-03-17 (×3): 1000 ug via SUBCUTANEOUS
  Filled 2023-03-14 (×3): qty 1

## 2023-03-14 MED ORDER — BISACODYL 10 MG RE SUPP
10.0000 mg | Freq: Two times a day (BID) | RECTAL | Status: AC
  Administered 2023-03-14 – 2023-03-15 (×2): 10 mg via RECTAL
  Filled 2023-03-14 (×4): qty 1

## 2023-03-14 MED ORDER — ACETAMINOPHEN 325 MG PO TABS: 650.0000 mg | ORAL_TABLET | Freq: Four times a day (QID) | ORAL | Status: DC | PRN

## 2023-03-14 MED ORDER — SODIUM CHLORIDE 0.9% IV SOLUTION: Freq: Once | INTRAVENOUS | Status: AC

## 2023-03-14 MED ORDER — LACTATED RINGERS IV SOLN: INTRAVENOUS | Status: DC

## 2023-03-14 MED ORDER — POLYETHYLENE GLYCOL 3350 17 G PO PACK
17.0000 g | PACK | Freq: Every day | ORAL | Status: DC
  Administered 2023-03-15 – 2023-03-17 (×2): 17 g via ORAL
  Filled 2023-03-14 (×3): qty 1

## 2023-03-14 NOTE — ED Notes (Signed)
Also per EMS, pt was recently constipated for a total of three days and she had a manual disempaction sometime prior to this ED visit, pt also has a Hx of stage 3 renal failure and DNR

## 2023-03-14 NOTE — Progress Notes (Signed)
Location:  [facility]   Place of Service:   SNF    CODE STATUS: dnr   Allergies  Allergen Reactions   Hydrochlorothiazide     05/29/2021 K+ 2.6 Other reaction(s): Other (See Comments) 05/29/2021 K+ 2.6   Codeine Nausea Only    Chief Complaint  Patient presents with   Acute Visit    Lab follow up    HPI:  She has had a decrease in her appetite and fluid intake. She became very constipated yesterday requiring manual removal of stool. She did develop some rectal bleeding with blood streaked stools since that time. She has a 4 gm drop in hgb from one month ago. Her wbc is elevated at 16; and she has mild acute renal failure. She denies any pain; she is pale.   Past Medical History:  Diagnosis Date   Decreased sense of smell    and taste-negative CT scan-2011   Depression    Essential hypertension    GERD (gastroesophageal reflux disease)    Heart murmur    heard first time 4/12   History of mammogram    2010 and she does not want to repeat them anymore   HOCM (hypertrophic obstructive cardiomyopathy) (HCC)    a. Dx by echo 05/2014.   Hypothyroidism    IBS (irritable bowel syndrome)    Impaired fasting glucose    Low back pain    Lumbar radiculopathy    with negative MRI (1991)   Postmenopausal    with osteopenia, bisphosphonates 1/05-1/11, improved osteopenia 4/12-repeat planned late 2015   Shingles    2015   Thyroid nodule    Vitamin D deficiency    repleted on weekly vitamin D 16109 iu    Past Surgical History:  Procedure Laterality Date   ABDOMINAL HYSTERECTOMY     BREAST BIOPSY     x2   CATARACT EXTRACTION     removal with IOL bilateral   GLAUCOMA SURGERY     laser   skin cancer removal     THYROIDECTOMY     subtotal    Social History   Socioeconomic History   Marital status: Married    Spouse name: Not on file   Number of children: Not on file   Years of education: Not on file   Highest education level: Not on file  Occupational  History   Not on file  Tobacco Use   Smoking status: Never   Smokeless tobacco: Never  Vaping Use   Vaping Use: Never used  Substance and Sexual Activity   Alcohol use: No    Alcohol/week: 0.0 standard drinks of alcohol   Drug use: Never   Sexual activity: Not on file  Other Topics Concern   Not on file  Social History Narrative   Not on file   Social Determinants of Health   Financial Resource Strain: Not on file  Food Insecurity: Not on file  Transportation Needs: Not on file  Physical Activity: Not on file  Stress: Not on file  Social Connections: Not on file  Intimate Partner Violence: Not on file   Family History  Problem Relation Age of Onset   Breast cancer Mother    Hypertension Father    CVA Father    Heart failure Sister    CAD Sister    Kidney disease Sister    COPD Brother    Lung cancer Brother    CAD Brother    Diabetes Brother  Stroke Father    Heart attack Neg Hx       VITAL SIGNS BP 131/65   Pulse (!) 59   Temp 98.6 F (37 C)   Resp 17   Ht 5\' 2"  (1.575 m)   Wt 134 lb 9.6 oz (61.1 kg)   SpO2 98%   BMI 24.62 kg/m   Outpatient Encounter Medications as of 03/14/2023  Medication Sig   acetaminophen (TYLENOL) 325 MG tablet Take 650 mg by mouth every 6 (six) hours.   amantadine (SYMMETREL) 50 MG/5ML solution Take 50 mg by mouth 2 (two) times daily. With breakfast and lunch   aspirin EC 81 MG EC tablet Take 1 tablet (81 mg total) by mouth daily with breakfast. Swallow whole.   Cholecalciferol (VITAMIN D-3) 25 MCG (1000 UT) CAPS Take 1 capsule by mouth daily.   cyanocobalamin 2000 MCG tablet Take 2,000 mcg by mouth daily.   donepezil (ARICEPT) 5 MG tablet Take 1 tablet (5 mg total) by mouth at bedtime.   famotidine (PEPCID) 20 MG tablet Take 1 tablet (20 mg total) by mouth daily.   levothyroxine (SYNTHROID) 50 MCG tablet Take by mouth daily before breakfast.   memantine (NAMENDA XR) 28 MG CP24 24 hr capsule Take 28 mg by mouth daily.    Menthol, Topical Analgesic, (BIOFREEZE) 4 % GEL Apply 1 Application topically 3 (three) times daily as needed. Left shoulder pain   NON FORMULARY Diet:  Regular, thin liquids- Add prune juice to Breakfast tray   oxymetazoline (AFRIN) 0.05 % nasal spray Place 1 spray into both nostrils as needed. epistaxis   polyethylene glycol (MIRALAX / GLYCOLAX) 17 g packet Take 17 g by mouth daily.   sertraline (ZOLOFT) 25 MG tablet Take 25 mg by mouth daily.  to be given with 50mg  to = 75mg    sertraline (ZOLOFT) 50 MG tablet Take 50 mg by mouth daily. to be given with 25mg  to = 75mg    sodium chloride (OCEAN) 0.65 % SOLN nasal spray Place 1 spray into both nostrils 3 (three) times daily. And as needed   UNABLE TO FIND Med Name: Medpass 120 mL twice daily due to weight loss following acute illness   vitamin C (ASCORBIC ACID) 500 MG tablet Take 500 mg by mouth daily.   No facility-administered encounter medications on file as of 03/14/2023.     SIGNIFICANT DIAGNOSTIC EXAMS  PREVIOUS   08-09-22: chest x-ray: Mild prominent interstitial densities bilateral compatible with pneumonitis. Mild cardiomegaly; mild osteopenia; mild osteoarthritis.   NO NEW EXAMS   LABS REVIEWED PREVIOUS     08-09-22: wbc 9.7; hgb 14.3; hct 43.2; mcv 89.6 plt 188; glucose 133; bun 26; creat 0.98; k+ 3.5; na++ 138; ca 8.7; gfr 55; ddimer: 0.95 crp 3.8 08-13-22: TSH 0.179; vitamin D 35.44  09-13-22: tsh 2.261 free t4: 0.95 11-20-22: urine culture: klebsiella pneumoniae   TODAY  01-31-23: wbc 8.6; hgb 13.4; hct 40.9; mcv 92.7 plt 208; glucose 95; bun 20; creat 0.87; k+ 3.3; na++ 138; ca 8.7; gfr >60; protein 6.1; albumin 3.4 hgb A1c 5.6; tsh 2.505; vitamin D 37.78; vitamin B 12: 207 vitamin B 6: 4.4 03-14-23: wbc 16.7; hgb 9.5; hct 28.1; mcv 96.6 plt 214; glucose 154; bun 34; creat 1.36; k+ 5.7; na++ 138; ca 9.1 gfr 37; protein 6.0 albumin 3.4   Review of Systems  Unable to perform ROS: Medical condition    Physical  Exam Constitutional:      General: She is not in acute distress.  Appearance: She is well-developed. She is not diaphoretic.  Neck:     Thyroid: No thyromegaly.  Cardiovascular:     Rate and Rhythm: Normal rate and regular rhythm.     Pulses: Normal pulses.     Heart sounds: Normal heart sounds.  Pulmonary:     Effort: Pulmonary effort is normal. No respiratory distress.     Breath sounds: Normal breath sounds.  Abdominal:     General: Bowel sounds are normal. There is no distension.     Palpations: Abdomen is soft.     Tenderness: There is no abdominal tenderness.  Musculoskeletal:     Cervical back: Neck supple.     Right lower leg: No edema.     Left lower leg: No edema.     Comments: Left hemiplegia   Lymphadenopathy:     Cervical: No cervical adenopathy.  Skin:    General: Skin is warm and dry.     Coloration: Skin is pale.  Neurological:     Mental Status: She is alert. Mental status is at baseline.  Psychiatric:        Mood and Affect: Mood normal.       ASSESSMENT/ PLAN:  TODAY  Acute renal failure superimposed on stage 3b chronic renal failure unspecified renal failure type Leukocytosis Acute anemia Hyperkalemia   Will send her to the ED for further work up and treatment options    Vickie Innocent NP [facility]   call [phone]

## 2023-03-14 NOTE — ED Triage Notes (Signed)
Pt arrived [facility] from [facility] c/o abnormal lab values and Hgb of 9. Staff at [facility] noticed blood in stool and states pt is more lethargic than normal

## 2023-03-14 NOTE — Consult Note (Signed)
Gastroenterology Consult   Referring Provider: No ref. provider found Primary Care Physician:  Sharee Holster, NP Primary Gastroenterologist:  unassigned  Patient ID: Vickie Mckee; [id number]; 1930/11/10   Admit date: 03/14/2023  LOS: 0 days   Date of Consultation: 03/14/2023  Reason for Consultation:  rectal bleeding.     History of Present Illness   Vickie Mckee is a 89 y.o. female from [facility] with HTN, GERD, HOCM, hypothyroidism, IBS, depression, vascular dementia presenting from the SNF for drop in Hgb, rectal bleeding, and lethargy.  Per SNF provider note, she has had decreased appetite and fluid intake over past few days. Has had increasing constipation requiring manual disimpaction yesterday. Staff noted some rectal bleeding with blood streaked stools since that time. Patient is unable to provide any history. Labs earlier today: White blood cell count 16,700, hemoglobin 9.5, hematocrit 28.1, MCV 96.6, platelet 214,000.  Hemoglobin was 13.4 back in April.  Potassium 5.7, creatinine 1.36 up from baseline of 0.87.  Labs in the ED: White blood cell count 16,100, hemoglobin 8.5, potassium 4.9, creatinine 1.35.  CT abdomen and pelvis without contrast showed rectum mildly distended with stool, colonic reticulosis chronic interstitial lung disease, irregular left lower lobe lung 6 mm.  Last colonoscopy 2006 by Dr. Danise Edge, exam completed to the splenic flexure only, due to colonic loop formation.  She had extensive left colonic diverticulosis.     Prior to Admission medications   Medication Sig Start Date End Date Taking? Authorizing Provider  Cholecalciferol (VITAMIN D-3) 25 MCG (1000 UT) CAPS Take 1 capsule by mouth daily.   Yes [provider]  levothyroxine (SYNTHROID) 50 MCG tablet Take by mouth daily before breakfast. 07/14/14  Yes [provider]  memantine (NAMENDA XR) 28 MG CP24 24 hr capsule Take 28 mg by mouth daily.   Yes [provider]  Phenylephrine HCl (PREPARATION H RE) Place 1 suppository rectally in the morning and at bedtime.   Yes [provider]  sertraline (ZOLOFT) 50 MG tablet Take 50 mg by mouth daily.   Yes [provider]  sodium chloride (OCEAN) 0.65 % SOLN nasal spray Place 1 spray into both nostrils 3 (three) times daily. And as needed   Yes [provider]  acetaminophen (TYLENOL) 325 MG tablet Take 650 mg by mouth every 6 (six) hours.    [provider]  amantadine (SYMMETREL) 50 MG/5ML solution Take 50 mg by mouth 2 (two) times daily. With breakfast and lunch    [provider]  aspirin EC 81 MG EC tablet Take 1 tablet (81 mg total) by mouth daily with breakfast. Swallow whole. 06/02/21   Shon Hale, MD  cyanocobalamin 1000 MCG tablet Take 2,000 mcg by mouth daily.    [provider]  donepezil (ARICEPT) 5 MG tablet Take 1 tablet (5 mg total) by mouth at bedtime. 06/28/21   Angiulli, Mcarthur Rossetti, PA-C  famotidine (PEPCID) 20 MG tablet Take 1 tablet (20 mg total) by mouth daily. 06/28/21   Angiulli, Mcarthur Rossetti, PA-C  Menthol, Topical Analgesic, (BIOFREEZE) 4 % GEL Apply 1 Application topically 3 (three) times daily as needed. Left shoulder pain    [provider]  NON FORMULARY Diet:  Regular, thin liquids- Add prune juice to Breakfast tray    [provider]  oxymetazoline (AFRIN) 0.05 % nasal spray Place 1 spray into both nostrils as needed. epistaxis    [provider]  polyethylene glycol (MIRALAX / GLYCOLAX)  17 g packet Take 17 g by mouth daily. 06/01/21   Shon Hale, MD  UNABLE TO FIND Med Name: Medpass 120 mL twice daily due to weight loss following acute illness    [provider]  vitamin C (ASCORBIC ACID) 500 MG tablet Take 500 mg by mouth daily.    [provider]    No current facility-administered medications for this encounter.   Current Outpatient Medications  Medication Sig  Dispense Refill   Cholecalciferol (VITAMIN D-3) 25 MCG (1000 UT) CAPS Take 1 capsule by mouth daily.     levothyroxine (SYNTHROID) 50 MCG tablet Take by mouth daily before breakfast.     memantine (NAMENDA XR) 28 MG CP24 24 hr capsule Take 28 mg by mouth daily.     Phenylephrine HCl (PREPARATION H RE) Place 1 suppository rectally in the morning and at bedtime.     sertraline (ZOLOFT) 50 MG tablet Take 50 mg by mouth daily.     sodium chloride (OCEAN) 0.65 % SOLN nasal spray Place 1 spray into both nostrils 3 (three) times daily. And as needed     acetaminophen (TYLENOL) 325 MG tablet Take 650 mg by mouth every 6 (six) hours.     amantadine (SYMMETREL) 50 MG/5ML solution Take 50 mg by mouth 2 (two) times daily. With breakfast and lunch     aspirin EC 81 MG EC tablet Take 1 tablet (81 mg total) by mouth daily with breakfast. Swallow whole. 30 tablet 0   cyanocobalamin 1000 MCG tablet Take 2,000 mcg by mouth daily.     donepezil (ARICEPT) 5 MG tablet Take 1 tablet (5 mg total) by mouth at bedtime. 3 tablet 0   famotidine (PEPCID) 20 MG tablet Take 1 tablet (20 mg total) by mouth daily.     Menthol, Topical Analgesic, (BIOFREEZE) 4 % GEL Apply 1 Application topically 3 (three) times daily as needed. Left shoulder pain     NON FORMULARY Diet:  Regular, thin liquids- Add prune juice to Breakfast tray     oxymetazoline (AFRIN) 0.05 % nasal spray Place 1 spray into both nostrils as needed. epistaxis     polyethylene glycol (MIRALAX / GLYCOLAX) 17 g packet Take 17 g by mouth daily. 14 each 0   UNABLE TO FIND Med Name: Medpass 120 mL twice daily due to weight loss following acute illness     vitamin C (ASCORBIC ACID) 500 MG tablet Take 500 mg by mouth daily.      Allergies as of 03/14/2023 - Review Complete 03/14/2023  Allergen Reaction Noted   Hydrochlorothiazide  07/05/2021   Codeine Nausea Only 07/23/2014    Past Medical History:  Diagnosis Date   Decreased sense of smell    and taste-negative  CT scan-2011   Depression    Essential hypertension    GERD (gastroesophageal reflux disease)    Heart murmur    heard first time 4/12   History of mammogram    2010 and she does not want to repeat them anymore   HOCM (hypertrophic obstructive cardiomyopathy) (HCC)    a. Dx by echo 05/2014.   Hypothyroidism    IBS (irritable bowel syndrome)    Impaired fasting glucose    Low back pain    Lumbar radiculopathy    with negative MRI (1991)   Postmenopausal    with osteopenia, bisphosphonates 1/05-1/11, improved osteopenia 4/12-repeat planned late 2015   Shingles    2015   Thyroid nodule    Vitamin D deficiency  repleted on weekly vitamin D 16109 iu    Past Surgical History:  Procedure Laterality Date   ABDOMINAL HYSTERECTOMY     BREAST BIOPSY     x2   CATARACT EXTRACTION     removal with IOL bilateral   GLAUCOMA SURGERY     laser   skin cancer removal     THYROIDECTOMY     subtotal    Family History  Problem Relation Age of Onset   Breast cancer Mother    Hypertension Father    CVA Father    Heart failure Sister    CAD Sister    Kidney disease Sister    COPD Brother    Lung cancer Brother    CAD Brother    Diabetes Brother    Stroke Father    Heart attack Neg Hx     Social History   Socioeconomic History   Marital status: Married    Spouse name: Not on file   Number of children: Not on file   Years of education: Not on file   Highest education level: Not on file  Occupational History   Not on file  Tobacco Use   Smoking status: Never   Smokeless tobacco: Never  Vaping Use   Vaping Use: Never used  Substance and Sexual Activity   Alcohol use: No    Alcohol/week: 0.0 standard drinks of alcohol   Drug use: Never   Sexual activity: Not on file  Other Topics Concern   Not on file  Social History Narrative   Not on file   Social Determinants of Health   Financial Resource Strain: Not on file  Food Insecurity: Not on file  Transportation  Needs: Not on file  Physical Activity: Not on file  Stress: Not on file  Social Connections: Not on file  Intimate Partner Violence: Not on file     Review of System:   Unable to obtain      Physical Examination:   Vital signs in last 24 hours: Temp:  [98.6 F (37 C)-98.9 F (37.2 C)] 98.9 F (37.2 C) (05/23 1241) Pulse Rate:  [59-86] 83 (05/23 1515) Resp:  [17-26] 19 (05/23 1515) BP: (117-131)/(54-67) 118/55 (05/23 1500) SpO2:  [92 %-98 %] 96 % (05/23 1515) Weight:  [61.1 kg] 61.1 kg (05/23 1253)   General: frail elderly WF in no acute distress. Easily awakens. Confused.  Head: Normocephalic, atraumatic.   Eyes: Conjunctiva pale, no icterus. Mouth: Oropharyngeal mucosa moist and pink   Neck: Supple without thyromegaly, masses, or lymphadenopathy.  Lungs: Clear to auscultation bilaterally.  Heart: Regular rate and rhythm, no murmurs rubs or gallops.  Abdomen: Bowel sounds are normal, nontender, nondistended, no hepatosplenomegaly or masses, no abdominal bruits or hernia , no rebound or guarding.   Rectal: per EDP hemorrhoids noted, fresh blood Extremities: No lower extremity edema, clubbing, deformity.  Neuro: Alert and oriented x 4 , grossly normal neurologically.  Skin: Warm and dry, no rash or jaundice.   Psych: Alert and cooperative, normal mood and affect.        Intake/Output from previous day: No intake/output data recorded. Intake/Output this shift: No intake/output data recorded.  Lab Results:   CBC Recent Labs    03/14/23 0955 03/14/23 1310  WBC 16.7* 16.1*  HGB 9.5* 8.5*  HCT 28.1* 26.3*  MCV 96.6 96.0  PLT 214 205   BMET Recent Labs    03/14/23 0955 03/14/23 1310  NA 138 138  K 5.7* 4.9  CL 101 103  CO2 28 28  GLUCOSE 154* 140*  BUN 34* 38*  CREATININE 1.36* 1.35*  CALCIUM 9.1 8.9   LFT Recent Labs    03/14/23 0955 03/14/23 1310  BILITOT 0.9 0.6  ALKPHOS 34* 35*  AST 15 16  ALT 12 13  PROT 6.0* 5.8*  ALBUMIN 3.4* 3.2*     Lipase No results for input(s): "LIPASE" in the last 72 hours.  PT/INR Recent Labs    03/14/23 1307  LABPROT 15.2  INR 1.2     Hepatitis Panel No results for input(s): "HEPBSAG", "HCVAB", "HEPAIGM", "HEPBIGM" in the last 72 hours.   Imaging Studies:   CT ABDOMEN PELVIS WO CONTRAST  Result Date: 03/14/2023 CLINICAL DATA:  Rectal bleeding and lethargy EXAM: CT ABDOMEN AND PELVIS WITHOUT CONTRAST TECHNIQUE: Multidetector CT imaging of the abdomen and pelvis was performed following the standard protocol without IV contrast. RADIATION DOSE REDUCTION: This exam was performed according to the departmental dose-optimization program which includes automated exposure control, adjustment of the mA and/or kV according to patient size and/or use of iterative reconstruction technique. COMPARISON:  None Available. FINDINGS: Lower chest: Diffuse peripheral reticulations and bilateral lower lobe traction bronchiectasis with scattered subsegmental mucous plugging. Irregular left lower lobe nodule measures 6 x 5 mm (4:16). No pleural effusion or pneumothorax demonstrated. Partially imaged heart size is normal. Coronary artery calcifications. Hepatobiliary: No focal hepatic lesions. No intra or extrahepatic biliary ductal dilation. Normal gallbladder. Pancreas: No focal lesions or main ductal dilation. Spleen: Normal in size without focal abnormality. Adrenals/Urinary Tract: No adrenal nodules. No suspicious renal mass, calculi or hydronephrosis. No focal bladder wall thickening. Stomach/Bowel: Normal appearance of the stomach. Large duodenal diverticulum. No evidence of bowel wall thickening or inflammatory changes. Colonic diverticulosis without acute diverticulitis. Rectum is mildly distended with stool. Appendix is not discretely seen. Vascular/Lymphatic: Aortic atherosclerosis. No enlarged abdominal or pelvic lymph nodes. Reproductive: No adnexal masses. Other: Mild presacral edema.  No free air.  Musculoskeletal: No acute or abnormal lytic or blastic osseous lesions. Multilevel degenerative changes of the partially imaged thoracic and lumbar spine. IMPRESSION: 1. No acute abnormality in the abdomen or pelvis. 2. Colonic diverticulosis without acute diverticulitis. Mildly distended rectum containing stool. 3. Diffuse peripheral reticulations and bilateral lower lobe traction bronchiectasis with scattered subsegmental mucous plugging, suggestive of chronic interstitial lung disease. 4. Irregular left lower lobe lung nodule measures 6 mm. Non-contrast chest CT at 6-12 months is recommended. If the nodule is stable at time of repeat CT, then future CT at 18-24 months (from today's scan) is considered optional for low-risk patients, but is recommended for high-risk patients. This recommendation follows the consensus statement: Guidelines for Management of Incidental Pulmonary Nodules Detected on CT Images: From the Fleischner Society 2017; Radiology 2017; 284:228-243. 5. Aortic Atherosclerosis (ICD10-I70.0). Coronary artery calcifications. Assessment for potential risk factor modification, dietary therapy or pharmacologic therapy may be warranted, if clinically indicated. Electronically Signed   By: Agustin Cree M.D.   On: 03/14/2023 14:50  [4 week]  Assessment:   89 y/o female with vascular dementia, HOCM, GERD, HTN, hypothyroidism, IBS, depression presenting with lethargy, rectal bleeding, significant decline in Hgb. GI consulted for rectal bleeding.  Rectal bleeding: patient with several days of constipation requiring disimpaction yesterday. SNF staff have noted blood in the patient's stool since then. According to [facility] last dose of miralax 03/11/23. She has had noted drop in Hgb from 13.4 (01/2023) to 9.5 earlier today. Now Hgb 8.5. she has had noted fresh  blood on rectal exam but no significant bleeding noted since arriving in the ED. I suspect bleeding from benign anorectal source in setting of  constipation/disimpaction/hemorrhoids but cannot exclude diverticular bleeding. Would anticipate further decline in Hgb with hydration. Currently very frail, lethargic, with leukocytosis, work up in progress.  Plan:   Plan for supportive measures, monitor H/H, transfuse if needed. Monitor for overt GI bleeding. At this time patient is very frail and would hold off on endoscopic evaluation. Can reassess tomorrow.  Ok for clear liquids when patient more alert.    LOS: 0 days   We would like to thank you for the opportunity to participate in the care of Vickie Mckee.  Leanna Battles. [facility] [phone] 5/23/20244:14 PM

## 2023-03-14 NOTE — Plan of Care (Signed)
  Problem: Clinical Measurements: Goal: Respiratory complications will improve Outcome: Progressing   Problem: Coping: Goal: Level of anxiety will decrease Outcome: Progressing   Problem: Pain Managment: Goal: General experience of comfort will improve Outcome: Progressing   Problem: Skin Integrity: Goal: Risk for impaired skin integrity will decrease Outcome: Progressing   Problem: Health Behavior/Discharge Planning: Goal: Ability to manage health-related needs will improve Outcome: Not Progressing   Problem: Nutrition: Goal: Adequate nutrition will be maintained Outcome: Not Progressing   Problem: Elimination: Goal: Will not experience complications related to bowel motility Outcome: Not Progressing

## 2023-03-14 NOTE — ED Notes (Signed)
DNR armband applied to R wrist

## 2023-03-14 NOTE — H&P (Signed)
TRH H&P   Patient Demographics:    Vickie Mckee, is a 89 y.o. female  MRN: [id number]   DOB - [birth year]  Admit Date - 03/14/2023  Outpatient Primary MD for the patient is Sharee Holster, NP  Referring MD/NP/PA: Susa Simmonds    Patient coming from: [facility] center  Chief Complaint  Patient presents with   Rectal Bleeding      HPI:    Vickie Mckee  is a 89 y.o. female, with medical history significant of essential hypertension, hypothyroidism, gastroesophageal reflux disease, depression, vitamin D deficiency, history of HOCM and chronic low back pain, history of CVA with left-sided weakness in 2022, since then she is SNF resident. -Is very poor historian due to dementia, history was obtained with assistance with daughter, and ED staff, patient presents to ED secondary to bright red blood per rectum, patient with severe constipation, requiring manual disimpaction at facility couple days ago, as well daughter reports patient has been more lethargic over the last 4 days, she was started on MiraLAX at her facility, she was noted to have a drop in her hemoglobin, her baseline 13.5 last month, it is 8.5 on admissio. -In ED labs confirm anemia with hemoglobin of 8.5, she was noted to have external hemorrhoid with bright red blood per rectum by ED physician, her CT abdomen pelvis noted for diverticulosis, [facility]ist consulted to admit.     Review of systems:    With advanced dementia, poor hisotrian, unreliable ROS   With Past History of the following :    Past Medical History:  Diagnosis Date   Decreased sense of smell    and taste-negative CT scan-2011   Depression    Essential hypertension    GERD (gastroesophageal reflux disease)    Heart murmur    heard first time 4/12   History of mammogram    2010 and she does not want to repeat them anymore   HOCM (hypertrophic  obstructive cardiomyopathy) (HCC)    a. Dx by echo 05/2014.   Hypothyroidism    IBS (irritable bowel syndrome)    Impaired fasting glucose    Low back pain    Lumbar radiculopathy    with negative MRI (1991)   Postmenopausal    with osteopenia, bisphosphonates 1/05-1/11, improved osteopenia 4/12-repeat planned late 2015   Shingles    2015   Thyroid nodule    Vitamin D deficiency    repleted on weekly vitamin D 40981 iu      Past Surgical History:  Procedure Laterality Date   ABDOMINAL HYSTERECTOMY     BREAST BIOPSY     x2   CATARACT EXTRACTION     removal with IOL bilateral   GLAUCOMA SURGERY     laser   skin cancer removal     THYROIDECTOMY     subtotal      Social History:  Social History   Tobacco Use   Smoking status: Never   Smokeless tobacco: Never  Substance Use Topics   Alcohol use: No    Alcohol/week: 0.0 standard drinks of alcohol       Family History :     Family History  Problem Relation Age of Onset   Breast cancer Mother    Hypertension Father    CVA Father    Heart failure Sister    CAD Sister    Kidney disease Sister    COPD Brother    Lung cancer Brother    CAD Brother    Diabetes Brother    Stroke Father    Heart attack Neg Hx       Home Medications:   Prior to Admission medications   Medication Sig Start Date End Date Taking? Authorizing Provider  Cholecalciferol (VITAMIN D-3) 25 MCG (1000 UT) CAPS Take 1 capsule by mouth daily.   Yes [provider]  levothyroxine (SYNTHROID) 50 MCG tablet Take by mouth daily before breakfast. 07/14/14  Yes [provider]  memantine (NAMENDA XR) 28 MG CP24 24 hr capsule Take 28 mg by mouth daily.   Yes [provider]  Phenylephrine HCl (PREPARATION H RE) Place 1 suppository rectally in the morning and at bedtime.   Yes [provider]  polyethylene glycol (MIRALAX / GLYCOLAX) 17 g packet Take 17 g by mouth daily. 06/01/21  Yes Emokpae, Courage, MD   sertraline (ZOLOFT) 50 MG tablet Take 50 mg by mouth daily.   Yes [provider]  sodium chloride (OCEAN) 0.65 % SOLN nasal spray Place 1 spray into both nostrils 3 (three) times daily. And as needed   Yes [provider]  acetaminophen (TYLENOL) 325 MG tablet Take 650 mg by mouth every 6 (six) hours.    [provider]  amantadine (SYMMETREL) 50 MG/5ML solution Take 50 mg by mouth 2 (two) times daily. With breakfast and lunch Patient not taking: Reported on 03/14/2023    [provider]  donepezil (ARICEPT) 5 MG tablet Take 1 tablet (5 mg total) by mouth at bedtime. Patient not taking: Reported on 03/14/2023 06/28/21   Angiulli, Mcarthur Rossetti, PA-C  famotidine (PEPCID) 20 MG tablet Take 1 tablet (20 mg total) by mouth daily. Patient not taking: Reported on 03/14/2023 06/28/21   Angiulli, Mcarthur Rossetti, PA-C  Menthol, Topical Analgesic, (BIOFREEZE) 4 % GEL Apply 1 Application topically 3 (three) times daily as needed. Left shoulder pain Patient not taking: Reported on 03/14/2023    [provider]  UNABLE TO FIND Med Name: Medpass 120 mL twice daily due to weight loss following acute illness Patient not taking: Reported on 03/14/2023    [provider]     Allergies:     Allergies  Allergen Reactions   Hydrochlorothiazide     05/29/2021 K+ 2.6 Other reaction(s): Other (See Comments) 05/29/2021 K+ 2.6   Codeine Nausea Only     Physical Exam:   Vitals  Blood pressure (!) 118/55, pulse 83, temperature 98.9 F (37.2 C), temperature source Rectal, resp. rate 19, height 5\' 2"  (1.575 m), weight 61.1 kg, SpO2 96 %.   1. General frail elderly female, chronically ill appearing.  2.  He is lethargic, but wakes up and answer some questions, she is confused with impaired cognition and insight, she is oriented x 2  3. No F.N deficits, ALL C.Nerves Intact, left side dense hemiparesis  4. Ears and Eyes appear Normal, Conjunctivae clear, PERRLA.  Moist Oral  Mucosa.  5. Supple Neck, No JVD, No cervical lymphadenopathy appriciated, No Carotid Bruits.  6. Symmetrical Chest wall movement, Good air movement bilaterally, CTAB.  7. RRR, No Gallops, Rubs or Murmurs, No Parasternal Heave.  8. Positive Bowel Sounds, Abdomen Soft, No tenderness, No organomegaly appriciated,No rebound -guarding or rigidity.  9.  No Cyanosis, Normal Skin Turgor, No Skin Rash or Bruise.  10.  Left-sided hemiparesis     Data Review:    CBC Recent Labs  Lab 03/14/23 0955 03/14/23 1310  WBC 16.7* 16.1*  HGB 9.5* 8.5*  HCT 28.1* 26.3*  PLT 214 205  MCV 96.6 96.0  MCH 32.6 31.0  MCHC 33.8 32.3  RDW 13.8 14.1  LYMPHSABS 3.5 4.4*  MONOABS 1.5* 1.9*  EOSABS 0.0 0.0  BASOSABS 0.0 0.0   ------------------------------------------------------------------------------------------------------------------  Chemistries  Recent Labs  Lab 03/14/23 0955 03/14/23 1310  NA 138 138  K 5.7* 4.9  CL 101 103  CO2 28 28  GLUCOSE 154* 140*  BUN 34* 38*  CREATININE 1.36* 1.35*  CALCIUM 9.1 8.9  AST 15 16  ALT 12 13  ALKPHOS 34* 35*  BILITOT 0.9 0.6   ------------------------------------------------------------------------------------------------------------------ estimated creatinine clearance is 24.3 mL/min (A) (by C-G formula based on SCr of 1.35 mg/dL (H)). ------------------------------------------------------------------------------------------------------------------ No results for input(s): "TSH", "T4TOTAL", "T3FREE", "THYROIDAB" in the last 72 hours.  Invalid input(s): "FREET3"  Coagulation profile Recent Labs  Lab 03/14/23 1307  INR 1.2   ------------------------------------------------------------------------------------------------------------------- No results for input(s): "DDIMER" in the last 72 hours. -------------------------------------------------------------------------------------------------------------------  Cardiac Enzymes No  results for input(s): "CKMB", "TROPONINI", "MYOGLOBIN" in the last 168 hours.  Invalid input(s): "CK" ------------------------------------------------------------------------------------------------------------------    Component Value Date/Time   BNP 66.0 01/25/2015 0010     ---------------------------------------------------------------------------------------------------------------  Urinalysis    Component Value Date/Time   COLORURINE YELLOW 06/27/2021 1521   APPEARANCEUR CLEAR 06/27/2021 1521   LABSPEC 1.020 06/27/2021 1521   PHURINE 6.0 06/27/2021 1521   GLUCOSEU NEGATIVE 06/27/2021 1521   HGBUR NEGATIVE 06/27/2021 1521   BILIRUBINUR NEGATIVE 06/27/2021 1521   KETONESUR NEGATIVE 06/27/2021 1521   PROTEINUR NEGATIVE 06/27/2021 1521   NITRITE NEGATIVE 06/27/2021 1521   LEUKOCYTESUR NEGATIVE 06/27/2021 1521    ----------------------------------------------------------------------------------------------------------------   Imaging Results:    CT ABDOMEN PELVIS WO CONTRAST  Result Date: 03/14/2023 CLINICAL DATA:  Rectal bleeding and lethargy EXAM: CT ABDOMEN AND PELVIS WITHOUT CONTRAST TECHNIQUE: Multidetector CT imaging of the abdomen and pelvis was performed following the standard protocol without IV contrast. RADIATION DOSE REDUCTION: This exam was performed according to the departmental dose-optimization program which includes automated exposure control, adjustment of the mA and/or kV according to patient size and/or use of iterative reconstruction technique. COMPARISON:  None Available. FINDINGS: Lower chest: Diffuse peripheral reticulations and bilateral lower lobe traction bronchiectasis with scattered subsegmental mucous plugging. Irregular left lower lobe nodule measures 6 x 5 mm (4:16). No pleural effusion or pneumothorax demonstrated. Partially imaged heart size is normal. Coronary artery calcifications. Hepatobiliary: No focal hepatic lesions. No intra or extrahepatic  biliary ductal dilation. Normal gallbladder. Pancreas: No focal lesions or main ductal dilation. Spleen: Normal in size without focal abnormality. Adrenals/Urinary Tract: No adrenal nodules. No suspicious renal mass, calculi or hydronephrosis. No focal bladder wall thickening. Stomach/Bowel: Normal appearance of the stomach. Large duodenal diverticulum. No evidence of bowel wall thickening or inflammatory changes. Colonic diverticulosis without acute diverticulitis. Rectum is mildly distended with stool. Appendix is not discretely seen. Vascular/Lymphatic: Aortic atherosclerosis. No enlarged abdominal or pelvic lymph  nodes. Reproductive: No adnexal masses. Other: Mild presacral edema.  No free air. Musculoskeletal: No acute or abnormal lytic or blastic osseous lesions. Multilevel degenerative changes of the partially imaged thoracic and lumbar spine. IMPRESSION: 1. No acute abnormality in the abdomen or pelvis. 2. Colonic diverticulosis without acute diverticulitis. Mildly distended rectum containing stool. 3. Diffuse peripheral reticulations and bilateral lower lobe traction bronchiectasis with scattered subsegmental mucous plugging, suggestive of chronic interstitial lung disease. 4. Irregular left lower lobe lung nodule measures 6 mm. Non-contrast chest CT at 6-12 months is recommended. If the nodule is stable at time of repeat CT, then future CT at 18-24 months (from today's scan) is considered optional for low-risk patients, but is recommended for high-risk patients. This recommendation follows the consensus statement: Guidelines for Management of Incidental Pulmonary Nodules Detected on CT Images: From the Fleischner Society 2017; Radiology 2017; 284:228-243. 5. Aortic Atherosclerosis (ICD10-I70.0). Coronary artery calcifications. Assessment for potential risk factor modification, dietary therapy or pharmacologic therapy may be warranted, if clinically indicated. Electronically Signed   By: Agustin Cree M.D.    On: 03/14/2023 14:50       Assessment & Plan:    Principal Problem:   Bright red blood per rectum Active Problems:   Hypothyroidism   Vascular dementia without behavioral disturbance (HCC)   GERD without esophagitis   Chronic constipation   Acute on chronic renal failure (HCC)   Acute anemia   Acute blood loss anemia due to lower GI bleed/bright red blood per rectum -Baseline hemoglobin 13.5, it is 8.5 this admission, she presents with bright red blood per rectum, most likely diverticular bleed. -CBC closely, transfuse as needed -GI consulted, okay for clear liquid diet, for now continue with supportive care and transfusion as needed  Acute encephalopathy, unspecified -She had more obtundent than baseline, she is with known dementia -She is with leukocytosis, will rule out infectious process with UA, will check urine cultures -Hold all sedative medications. -If no improvement over next 24 hours we will proceed with CT head -For now we will keep n.p.o.  History of CVA with residual left-sided weakness-continue with supportive care  History of dementia -Resume her meds when she is more awake and able to swallow  Hypothyroidism -continue with Synthroid  GERD -Continue with Protonix in IV form  AKI on CKD -Continue with gentle hydration and avoid nephrotoxic medications  Leukocytosis -She is afebrile, will check UA and blood cultures  B12 deficiency -B12 level is low 207 once measured last month, will start on IM supplements   DVT Prophylaxis  SCDs   AM Labs Ordered, also please review Full Orders  Family Communication: Admission, patients condition and plan of care including tests being ordered have been discussed with the duaghter Lupita Leash at bedside who indicate understanding and agree with the plan and Code Status.  Code Status DNR, has Form,   Likely DC to  back to [facility] center   Consults called: GI by ED    Admission status: observation    Time spent in  minutes : 70 minutes   Huey Bienenstock M.D on 03/14/2023 at 4:28 PM   [facility] - Office  [phone]

## 2023-03-14 NOTE — Progress Notes (Signed)
Hemoglobin keep dropping ,most recent 7.2,will transfuse 2 units PRBC. Huey Bienenstock MD

## 2023-03-14 NOTE — Progress Notes (Signed)
Location:  [facility] Room Number: 138 Place of Service:  SNF (31)   CODE STATUS: DNR  Allergies  Allergen Reactions   Hydrochlorothiazide     05/29/2021 K+ 2.6 Other reaction(s): Other (See Comments) 05/29/2021 K+ 2.6   Codeine Nausea Only    Chief Complaint  Patient presents with   Acute Visit    Lab follow up    HPI:    Past Medical History:  Diagnosis Date   Decreased sense of smell    and taste-negative CT scan-2011   Depression    Essential hypertension    GERD (gastroesophageal reflux disease)    Heart murmur    heard first time 4/12   History of mammogram    2010 and she does not want to repeat them anymore   HOCM (hypertrophic obstructive cardiomyopathy) (HCC)    a. Dx by echo 05/2014.   Hypothyroidism    IBS (irritable bowel syndrome)    Impaired fasting glucose    Low back pain    Lumbar radiculopathy    with negative MRI (1991)   Postmenopausal    with osteopenia, bisphosphonates 1/05-1/11, improved osteopenia 4/12-repeat planned late 2015   Shingles    2015   Thyroid nodule    Vitamin D deficiency    repleted on weekly vitamin D 78295 iu    Past Surgical History:  Procedure Laterality Date   ABDOMINAL HYSTERECTOMY     BREAST BIOPSY     x2   CATARACT EXTRACTION     removal with IOL bilateral   GLAUCOMA SURGERY     laser   skin cancer removal     THYROIDECTOMY     subtotal    Social History   Socioeconomic History   Marital status: Married    Spouse name: Not on file   Number of children: Not on file   Years of education: Not on file   Highest education level: Not on file  Occupational History   Not on file  Tobacco Use   Smoking status: Never   Smokeless tobacco: Never  Vaping Use   Vaping Use: Never used  Substance and Sexual Activity   Alcohol use: No    Alcohol/week: 0.0 standard drinks of alcohol   Drug use: Never   Sexual activity: Not on file  Other Topics Concern   Not on file  Social  History Narrative   Not on file   Social Determinants of Health   Financial Resource Strain: Not on file  Food Insecurity: Not on file  Transportation Needs: Not on file  Physical Activity: Not on file  Stress: Not on file  Social Connections: Not on file  Intimate Partner Violence: Not on file   Family History  Problem Relation Age of Onset   Breast cancer Mother    Hypertension Father    CVA Father    Heart failure Sister    CAD Sister    Kidney disease Sister    COPD Brother    Lung cancer Brother    CAD Brother    Diabetes Brother    Stroke Father    Heart attack Neg Hx       VITAL SIGNS BP 131/65   Pulse (!) 59   Temp 98.6 F (37 C)   Resp 17   Ht 5\' 2"  (1.575 m)   Wt 134 lb 9.6 oz (61.1 kg)   SpO2 98%   BMI 24.62 kg/m   Outpatient Encounter Medications as  of 03/14/2023  Medication Sig   acetaminophen (TYLENOL) 325 MG tablet Take 650 mg by mouth every 6 (six) hours.   amantadine (SYMMETREL) 50 MG/5ML solution Take 50 mg by mouth 2 (two) times daily. With breakfast and lunch   aspirin EC 81 MG EC tablet Take 1 tablet (81 mg total) by mouth daily with breakfast. Swallow whole.   Cholecalciferol (VITAMIN D-3) 25 MCG (1000 UT) CAPS Take 1 capsule by mouth daily.   cyanocobalamin 1000 MCG tablet Take 2,000 mcg by mouth daily.   donepezil (ARICEPT) 5 MG tablet Take 1 tablet (5 mg total) by mouth at bedtime.   famotidine (PEPCID) 20 MG tablet Take 1 tablet (20 mg total) by mouth daily.   levothyroxine (SYNTHROID) 50 MCG tablet Take by mouth daily before breakfast.   memantine (NAMENDA XR) 28 MG CP24 24 hr capsule Take 28 mg by mouth daily.   Menthol, Topical Analgesic, (BIOFREEZE) 4 % GEL Apply 1 Application topically 3 (three) times daily as needed. Left shoulder pain   NON FORMULARY Diet:  Regular, thin liquids- Add prune juice to Breakfast tray   oxymetazoline (AFRIN) 0.05 % nasal spray Place 1 spray into both nostrils as needed. epistaxis   Phenylephrine HCl  (PREPARATION H RE) Place 1 suppository rectally in the morning and at bedtime.   polyethylene glycol (MIRALAX / GLYCOLAX) 17 g packet Take 17 g by mouth daily.   sertraline (ZOLOFT) 50 MG tablet Take 50 mg by mouth daily.   sodium chloride (OCEAN) 0.65 % SOLN nasal spray Place 1 spray into both nostrils 3 (three) times daily. And as needed   UNABLE TO FIND Med Name: Medpass 120 mL twice daily due to weight loss following acute illness   vitamin C (ASCORBIC ACID) 500 MG tablet Take 500 mg by mouth daily.   [DISCONTINUED] sertraline (ZOLOFT) 25 MG tablet Take 25 mg by mouth daily.  to be given with 50mg  to = 75mg    No facility-administered encounter medications on file as of 03/14/2023.     SIGNIFICANT DIAGNOSTIC EXAMS       ASSESSMENT/ PLAN:     Synthia Innocent NP [facility]  Contact [phone] Monday through Friday 8am- 5pm  After hours call [phone]

## 2023-03-14 NOTE — ED Provider Notes (Signed)
[name] EMERGENCY DEPARTMENT AT [facility] Provider Note   CSN: [id number] Arrival date & time: 03/14/23  1220     History Chief Complaint  Patient presents with   Rectal Bleeding    Vickie Mckee is a 89 y.o. female.  Patient with past history significant for chronic kidney disease, hypertension, chronic constipation presents to the emergency department complaints of rectal bleeding.  Patient unable to verbalize much of history so patient's daughter is reporting history.  States that patient lives at [facility] and they were concerned as patient has been reporting increasing difficulty with bowel movements and has had some blood in stool.  Denies any fever, abdominal pain, nausea, vomiting.  Currently takes aspirin no other anticoagulants.  Denies any prior history significant for any GI bleeds.  Last colonoscopy was approximately 15 years ago but incomplete due to concerns of colonic loop formation.   Rectal Bleeding      Home Medications Prior to Admission medications   Medication Sig Start Date End Date Taking? Authorizing Provider  Cholecalciferol (VITAMIN D-3) 25 MCG (1000 UT) CAPS Take 1 capsule by mouth daily.   Yes [provider]  levothyroxine (SYNTHROID) 50 MCG tablet Take by mouth daily before breakfast. 07/14/14  Yes [provider]  memantine (NAMENDA XR) 28 MG CP24 24 hr capsule Take 28 mg by mouth daily.   Yes [provider]  Phenylephrine HCl (PREPARATION H RE) Place 1 suppository rectally in the morning and at bedtime.   Yes [provider]  polyethylene glycol (MIRALAX / GLYCOLAX) 17 g packet Take 17 g by mouth daily. 06/01/21  Yes Emokpae, Courage, MD  sertraline (ZOLOFT) 50 MG tablet Take 50 mg by mouth daily.   Yes [provider]  sodium chloride (OCEAN) 0.65 % SOLN nasal spray Place 1 spray into both nostrils 3 (three) times daily. And as needed   Yes [provider]  acetaminophen (TYLENOL)  325 MG tablet Take 650 mg by mouth every 6 (six) hours.    [provider]  amantadine (SYMMETREL) 50 MG/5ML solution Take 50 mg by mouth 2 (two) times daily. With breakfast and lunch Patient not taking: Reported on 03/14/2023    [provider]  donepezil (ARICEPT) 5 MG tablet Take 1 tablet (5 mg total) by mouth at bedtime. Patient not taking: Reported on 03/14/2023 06/28/21   Angiulli, Mcarthur Rossetti, PA-C  famotidine (PEPCID) 20 MG tablet Take 1 tablet (20 mg total) by mouth daily. Patient not taking: Reported on 03/14/2023 06/28/21   Angiulli, Mcarthur Rossetti, PA-C  Menthol, Topical Analgesic, (BIOFREEZE) 4 % GEL Apply 1 Application topically 3 (three) times daily as needed. Left shoulder pain Patient not taking: Reported on 03/14/2023    [provider]  UNABLE TO FIND Med Name: Medpass 120 mL twice daily due to weight loss following acute illness Patient not taking: Reported on 03/14/2023    [provider]      Allergies    Hydrochlorothiazide and Codeine    Review of Systems   Review of Systems  Gastrointestinal:  Positive for anal bleeding, blood in stool and hematochezia.  All other systems reviewed and are negative.   Physical Exam Updated Vital Signs BP (!) 131/56   Pulse 83   Temp 98.3 F (36.8 C)   Resp 18   Ht 5\' 2"  (1.575 m)   Wt 61.1 kg   SpO2 98%   BMI 24.62 kg/m  Physical Exam Vitals and nursing note reviewed.  Exam conducted with a chaperone present.  Constitutional:      General: She is not in acute distress.    Appearance: She is well-developed. She is ill-appearing.  HENT:     Head: Normocephalic and atraumatic.  Eyes:     Conjunctiva/sclera: Conjunctivae normal.  Cardiovascular:     Rate and Rhythm: Normal rate and regular rhythm.     Heart sounds: No murmur heard. Pulmonary:     Effort: Pulmonary effort is normal. No respiratory distress.     Breath sounds: Normal breath sounds.  Abdominal:     Palpations: Abdomen is soft.      Tenderness: There is no abdominal tenderness.  Genitourinary:    Rectum: Guaiac result positive. External hemorrhoid present.  Musculoskeletal:        General: No swelling.     Cervical back: Neck supple.  Skin:    General: Skin is warm and dry.     Capillary Refill: Capillary refill takes less than 2 seconds.  Neurological:     Mental Status: She is alert.  Psychiatric:        Mood and Affect: Mood normal.     ED Results / Procedures / Treatments   Labs (all labs ordered are listed, but only abnormal results are displayed) Labs Reviewed  CBC WITH DIFFERENTIAL/PLATELET - Abnormal; Notable for the following components:      Result Value   WBC 16.1 (*)    RBC 2.74 (*)    Hemoglobin 8.5 (*)    HCT 26.3 (*)    Neutro Abs 9.7 (*)    Lymphs Abs 4.4 (*)    Monocytes Absolute 1.9 (*)    Abs Immature Granulocytes 0.08 (*)    All other components within normal limits  COMPREHENSIVE METABOLIC PANEL - Abnormal; Notable for the following components:   Glucose, Bld 140 (*)    BUN 38 (*)    Creatinine, Ser 1.35 (*)    Total Protein 5.8 (*)    Albumin 3.2 (*)    Alkaline Phosphatase 35 (*)    GFR, Estimated 38 (*)    All other components within normal limits  CBC - Abnormal; Notable for the following components:   WBC 11.7 (*)    RBC 2.55 (*)    Hemoglobin 8.0 (*)    HCT 25.3 (*)    All other components within normal limits  HEMOGLOBIN AND HEMATOCRIT, BLOOD - Abnormal; Notable for the following components:   Hemoglobin 7.2 (*)    HCT 22.3 (*)    All other components within normal limits  POC OCCULT BLOOD, ED - Abnormal; Notable for the following components:   Fecal Occult Bld POSITIVE (*)    All other components within normal limits  CULTURE, BLOOD (ROUTINE X 2)  CULTURE, BLOOD (ROUTINE X 2)  PROTIME-INR  APTT  URINALYSIS, ROUTINE W REFLEX MICROSCOPIC  BASIC METABOLIC PANEL  CBC  HEMOGLOBIN AND HEMATOCRIT, BLOOD  HEMOGLOBIN AND HEMATOCRIT, BLOOD  TYPE AND SCREEN  ABO/RH     EKG None  Radiology CT ABDOMEN PELVIS WO CONTRAST  Result Date: 03/14/2023 CLINICAL DATA:  Rectal bleeding and lethargy EXAM: CT ABDOMEN AND PELVIS WITHOUT CONTRAST TECHNIQUE: Multidetector CT imaging of the abdomen and pelvis was performed following the standard protocol without IV contrast. RADIATION DOSE REDUCTION: This exam was performed according to the departmental dose-optimization program which includes automated exposure control, adjustment of the mA and/or kV according to patient size and/or use of iterative reconstruction technique. COMPARISON:  None Available. FINDINGS:  Lower chest: Diffuse peripheral reticulations and bilateral lower lobe traction bronchiectasis with scattered subsegmental mucous plugging. Irregular left lower lobe nodule measures 6 x 5 mm (4:16). No pleural effusion or pneumothorax demonstrated. Partially imaged heart size is normal. Coronary artery calcifications. Hepatobiliary: No focal hepatic lesions. No intra or extrahepatic biliary ductal dilation. Normal gallbladder. Pancreas: No focal lesions or main ductal dilation. Spleen: Normal in size without focal abnormality. Adrenals/Urinary Tract: No adrenal nodules. No suspicious renal mass, calculi or hydronephrosis. No focal bladder wall thickening. Stomach/Bowel: Normal appearance of the stomach. Large duodenal diverticulum. No evidence of bowel wall thickening or inflammatory changes. Colonic diverticulosis without acute diverticulitis. Rectum is mildly distended with stool. Appendix is not discretely seen. Vascular/Lymphatic: Aortic atherosclerosis. No enlarged abdominal or pelvic lymph nodes. Reproductive: No adnexal masses. Other: Mild presacral edema.  No free air. Musculoskeletal: No acute or abnormal lytic or blastic osseous lesions. Multilevel degenerative changes of the partially imaged thoracic and lumbar spine. IMPRESSION: 1. No acute abnormality in the abdomen or pelvis. 2. Colonic diverticulosis without  acute diverticulitis. Mildly distended rectum containing stool. 3. Diffuse peripheral reticulations and bilateral lower lobe traction bronchiectasis with scattered subsegmental mucous plugging, suggestive of chronic interstitial lung disease. 4. Irregular left lower lobe lung nodule measures 6 mm. Non-contrast chest CT at 6-12 months is recommended. If the nodule is stable at time of repeat CT, then future CT at 18-24 months (from today's scan) is considered optional for low-risk patients, but is recommended for high-risk patients. This recommendation follows the consensus statement: Guidelines for Management of Incidental Pulmonary Nodules Detected on CT Images: From the Fleischner Society 2017; Radiology 2017; 284:228-243. 5. Aortic Atherosclerosis (ICD10-I70.0). Coronary artery calcifications. Assessment for potential risk factor modification, dietary therapy or pharmacologic therapy may be warranted, if clinically indicated. Electronically Signed   By: Agustin Cree M.D.   On: 03/14/2023 14:50    Procedures Procedures   Medications Ordered in ED Medications  levothyroxine (SYNTHROID) tablet 50 mcg (has no administration in time range)  shark liver oil-cocoa butter (PREPARATION H) suppository 1 suppository (has no administration in time range)  acetaminophen (TYLENOL) tablet 650 mg (has no administration in time range)    Or  acetaminophen (TYLENOL) suppository 650 mg (has no administration in time range)  albuterol (PROVENTIL) (2.5 MG/3ML) 0.083% nebulizer solution 2.5 mg (has no administration in time range)  bisacodyl (DULCOLAX) suppository 10 mg (10 mg Rectal Given 03/14/23 2107)  cyanocobalamin (VITAMIN B12) injection 1,000 mcg (1,000 mcg Subcutaneous Given 03/14/23 1825)  lactated ringers infusion ( Intravenous New Bag/Given 03/14/23 1823)  polyethylene glycol (MIRALAX / GLYCOLAX) packet 17 g (has no administration in time range)  sodium chloride 0.9 % bolus 500 mL (0 mLs Intravenous Stopped  03/14/23 1416)  sodium chloride 0.9 % bolus 1,000 mL (1,000 mLs Intravenous Bolus 03/14/23 1453)    ED Course/ Medical Decision Making/ A&P                           Medical Decision Making Amount and/or Complexity of Data Reviewed Labs: ordered. Radiology: ordered.  Risk Decision regarding hospitalization.   This patient presents to the ED for concern of rectal bleeding, this involves an extensive number of treatment options, and is a complaint that carries with it a high risk of complications and morbidity.  The differential diagnosis includes lower GI bleed, upper GI bleed, anal fissure, thrombosed hemorrhoid, bowel obstruction   Co morbidities that complicate the patient evaluation  Vascular  dementia, acute on chronic renal failure, HOCM, left hemiplegia   Lab Tests:  I Ordered, and personally interpreted labs.  The pertinent results include: Leukocytosis at 16.4, hemoglobin declined to 8.5, CMP notable for AKI with creatinine and BUN elevated and GFR declined, Hemoccult positive, blood cultures pending   Imaging Studies ordered:  I ordered imaging studies including CT abdomen pelvis without contrast I independently visualized and interpreted imaging which showed no acute abdominal abnormalities I agree with the radiologist interpretation   Cardiac Monitoring: / EKG:  The patient was maintained on a cardiac monitor.  I personally viewed and interpreted the cardiac monitored which showed an underlying rhythm of: Sinus rhythm with PVCs noted   Consultations Obtained:  I requested consultation with the GI, hospitalist,  and discussed lab and imaging findings as well as pertinent plan - they recommend: Inpatient admission for evaluation by GI as well as stabilization given acute blood loss anemia   Problem List / ED Course / Critical interventions / Medication management  Patient presents emergency department complaints of rectal bleeding.  Patient is a resident at [facility] and daughter reports that she has been experiencing increasing fatigue over the last few days as well as increasing constipation.  Staff at [facility] concerned as patient had a hemoglobin returned at 9.  Some bright red blood per rectum was noted with blood noted in toilet bowl.  No prior history of any GI bleeds and not currently on any anticoagulation but does reportedly take aspirin based on medication reconciliation provided by [facility]. Lab workup initiated for suspected GI bleed which did show declining hemoglobin compared to 1 month prior.  Rectal examination resulted in positive Hemoccult.  CMP shows signs of AKI likely due to patient dehydration as patient has reportedly not been tolerating oral intake.  Fluid resuscitation initiated.  Discussed admission with Dr. Levon Hedger with gastroenterology who advised that his team will evaluate patient.  Will admit patient to medicine. Also spoke with Dr. Randol Kern to admit patient to medicine. I ordered medication including fluids for dehydration Reevaluation of the patient after these medicines showed that the patient improved I have reviewed the patients home medicines and have made adjustments as needed   Social Determinants of Health:  History of stroke resulting in hemiplegia of left side, poor historian   Test / Admission - Considered:  Spoke with Dr. Levon Hedger, gastroenterology, will evaluate patient for suspected lower GI bleed. Also spoke with Dr. Randol Kern, hospitalist, who will admit patient to medicine.   Final Clinical Impression(s) / ED Diagnoses Final diagnoses:  AKI (acute kidney injury) [facility])  Rectal bleeding    Rx / DC Orders ED Discharge Orders     None         Salomon Mast 03/14/23 2242    Bethann Berkshire, MD 03/24/23 1104

## 2023-03-14 NOTE — ED Notes (Signed)
Patient transported to CT 

## 2023-03-14 NOTE — ED Notes (Signed)
POC occult blood positive--PA-C aware

## 2023-03-14 NOTE — ED Notes (Addendum)
Per daughter at bedside, pt's L arm is paralyzed at baseline

## 2023-03-15 DIAGNOSIS — E538 Deficiency of other specified B group vitamins: Secondary | ICD-10-CM | POA: Diagnosis present

## 2023-03-15 DIAGNOSIS — J849 Interstitial pulmonary disease, unspecified: Secondary | ICD-10-CM | POA: Diagnosis present

## 2023-03-15 DIAGNOSIS — F0150 Vascular dementia without behavioral disturbance: Secondary | ICD-10-CM | POA: Diagnosis not present

## 2023-03-15 DIAGNOSIS — D649 Anemia, unspecified: Secondary | ICD-10-CM | POA: Diagnosis not present

## 2023-03-15 DIAGNOSIS — Z79890 Hormone replacement therapy (postmenopausal): Secondary | ICD-10-CM | POA: Diagnosis not present

## 2023-03-15 DIAGNOSIS — R627 Adult failure to thrive: Secondary | ICD-10-CM | POA: Diagnosis present

## 2023-03-15 DIAGNOSIS — Z85828 Personal history of other malignant neoplasm of skin: Secondary | ICD-10-CM | POA: Diagnosis not present

## 2023-03-15 DIAGNOSIS — E876 Hypokalemia: Secondary | ICD-10-CM | POA: Diagnosis present

## 2023-03-15 DIAGNOSIS — F05 Delirium due to known physiological condition: Secondary | ICD-10-CM | POA: Diagnosis not present

## 2023-03-15 DIAGNOSIS — K5731 Diverticulosis of large intestine without perforation or abscess with bleeding: Secondary | ICD-10-CM | POA: Diagnosis present

## 2023-03-15 DIAGNOSIS — I421 Obstructive hypertrophic cardiomyopathy: Secondary | ICD-10-CM | POA: Diagnosis present

## 2023-03-15 DIAGNOSIS — K922 Gastrointestinal hemorrhage, unspecified: Secondary | ICD-10-CM | POA: Diagnosis present

## 2023-03-15 DIAGNOSIS — R54 Age-related physical debility: Secondary | ICD-10-CM | POA: Diagnosis present

## 2023-03-15 DIAGNOSIS — Z66 Do not resuscitate: Secondary | ICD-10-CM | POA: Diagnosis present

## 2023-03-15 DIAGNOSIS — D62 Acute posthemorrhagic anemia: Secondary | ICD-10-CM | POA: Diagnosis present

## 2023-03-15 DIAGNOSIS — K5909 Other constipation: Secondary | ICD-10-CM | POA: Diagnosis not present

## 2023-03-15 DIAGNOSIS — F0153 Vascular dementia, unspecified severity, with mood disturbance: Secondary | ICD-10-CM | POA: Diagnosis present

## 2023-03-15 DIAGNOSIS — E860 Dehydration: Secondary | ICD-10-CM | POA: Diagnosis present

## 2023-03-15 DIAGNOSIS — Z8249 Family history of ischemic heart disease and other diseases of the circulatory system: Secondary | ICD-10-CM | POA: Diagnosis not present

## 2023-03-15 DIAGNOSIS — N179 Acute kidney failure, unspecified: Secondary | ICD-10-CM | POA: Diagnosis present

## 2023-03-15 DIAGNOSIS — K219 Gastro-esophageal reflux disease without esophagitis: Secondary | ICD-10-CM | POA: Diagnosis present

## 2023-03-15 DIAGNOSIS — E039 Hypothyroidism, unspecified: Secondary | ICD-10-CM | POA: Diagnosis present

## 2023-03-15 DIAGNOSIS — G9340 Encephalopathy, unspecified: Secondary | ICD-10-CM | POA: Diagnosis present

## 2023-03-15 DIAGNOSIS — N189 Chronic kidney disease, unspecified: Secondary | ICD-10-CM | POA: Diagnosis present

## 2023-03-15 DIAGNOSIS — Z1152 Encounter for screening for COVID-19: Secondary | ICD-10-CM | POA: Diagnosis not present

## 2023-03-15 DIAGNOSIS — I129 Hypertensive chronic kidney disease with stage 1 through stage 4 chronic kidney disease, or unspecified chronic kidney disease: Secondary | ICD-10-CM | POA: Diagnosis present

## 2023-03-15 DIAGNOSIS — K625 Hemorrhage of anus and rectum: Secondary | ICD-10-CM | POA: Diagnosis present

## 2023-03-15 DIAGNOSIS — F32A Depression, unspecified: Secondary | ICD-10-CM | POA: Diagnosis present

## 2023-03-15 DIAGNOSIS — I69354 Hemiplegia and hemiparesis following cerebral infarction affecting left non-dominant side: Secondary | ICD-10-CM | POA: Diagnosis not present

## 2023-03-15 LAB — BASIC METABOLIC PANEL
Anion gap: 7 (ref 5–15)
BUN: 25 mg/dL — ABNORMAL HIGH (ref 8–23)
CO2: 28 mmol/L (ref 22–32)
Calcium: 8.4 mg/dL — ABNORMAL LOW (ref 8.9–10.3)
Chloride: 104 mmol/L (ref 98–111)
Creatinine, Ser: 1.03 mg/dL — ABNORMAL HIGH (ref 0.44–1.00)
GFR, Estimated: 52 mL/min — ABNORMAL LOW (ref >60)
Glucose, Bld: 101 mg/dL — ABNORMAL HIGH (ref 70–99)
Potassium: 3.9 mmol/L (ref 3.5–5.1)
Sodium: 139 mmol/L (ref 135–145)

## 2023-03-15 LAB — CBC
HCT: 36.9 % (ref 36.0–46.0)
Hemoglobin: 12.2 g/dL (ref 12.0–15.0)
MCH: 30.7 pg (ref 26.0–34.0)
MCHC: 33.1 g/dL (ref 30.0–36.0)
MCV: 92.7 fL (ref 80.0–100.0)
Platelets: 149 10*3/uL — ABNORMAL LOW (ref 150–400)
RBC: 3.98 MIL/uL (ref 3.87–5.11)
RDW: 15.6 % — ABNORMAL HIGH (ref 11.5–15.5)
WBC: 10.9 10*3/uL — ABNORMAL HIGH (ref 4.0–10.5)
nRBC: 0 % (ref 0.0–0.2)

## 2023-03-15 LAB — TYPE AND SCREEN

## 2023-03-15 LAB — CULTURE, BLOOD (ROUTINE X 2): Culture: NO GROWTH

## 2023-03-15 LAB — HEMOGLOBIN AND HEMATOCRIT, BLOOD
HCT: 35.2 % — ABNORMAL LOW (ref 36.0–46.0)
Hemoglobin: 11.8 g/dL — ABNORMAL LOW (ref 12.0–15.0)

## 2023-03-15 LAB — BPAM RBC: Blood Product Expiration Date: 202406242359

## 2023-03-15 MED ORDER — LACTATED RINGERS IV SOLN: INTRAVENOUS | Status: AC

## 2023-03-15 NOTE — Progress Notes (Signed)
Transition of Care [facility]) - Inpatient Brief Assessment   Patient Details  Name: Vickie Mckee MRN: [id number] Date of Birth: 07/09/1931  [facility] of [facility]) CM/SW Contact:    Leitha Bleak, RN Phone Number: 03/15/2023, 8:03 AM   Clinical Narrative:   Patient is from [facility]   Transition of Care Asessment: Insurance and Status: (P) Insurance coverage has been reviewed Patient has primary care physician: (P) Yes Home environment has been reviewed: (P) [facility] Prior level of function:: (P) needs assistance Prior/Current Home Services: (P) No current home services Social Determinants of Health Reivew: (P) SDOH reviewed no interventions necessary Readmission risk has been reviewed: (P) Yes Transition of care needs: (P) no transition of care needs at this time

## 2023-03-15 NOTE — Progress Notes (Addendum)
Taken before admin of blood

## 2023-03-15 NOTE — Progress Notes (Signed)
PROGRESS NOTE   Vickie Mckee  WGN:[id number] DOB: 01-21-1931 DOA: 03/14/2023 PCP: Sharee Holster, NP   Chief Complaint  Patient presents with   Rectal Bleeding   Level of care: Telemetry  Brief Admission History:  89 y.o. female, with medical history significant of essential hypertension, hypothyroidism, gastroesophageal reflux disease, depression, vitamin D deficiency, history of HOCM and chronic low back pain, history of CVA with left-sided weakness in 2022, since then she is SNF resident. -Is very poor historian due to dementia, history was obtained with assistance with daughter, and ED staff, patient presents to ED secondary to bright red blood per rectum, patient with severe constipation, requiring manual disimpaction at facility couple days ago, as well daughter reports patient has been more lethargic over the last 4 days, she was started on MiraLAX at her facility, she was noted to have a drop in her hemoglobin, her baseline 13.5 last month, it is 8.5 on admissio. -In ED labs confirm anemia with hemoglobin of 8.5, she was noted to have external hemorrhoid with bright red blood per rectum by ED physician, her CT abdomen pelvis noted for diverticulosis, [facility] hospitalist consulted to admit.    Assessment and Plan:  Acute blood loss anemia due to lower GI bleed/bright red blood per rectum -Baseline hemoglobin 13.5, it is 8.5 this admission, she presents with bright red blood per rectum, most likely diverticular bleed. -CBC being followed -s/p 2 units PRBC and Hg up to 12 from 7.2 yesterday.  -GI consulted, further recommendations to follow   Acute encephalopathy, unspecified -She was more somnolent and confused reportedly than baseline, she is with known dementia -She is with leukocytosis, will rule out infectious process with UA, will check urine cultures -Hold all sedative medications. -Mentation seems to be better this morning.  Follow.   Leukocytosis - reactive to GI  hemorrhage - no s/s of infection found  - WBC trending down   History of CVA with residual left-sided weakness-continue with supportive care   History of dementia -Resume her meds when she is more awake and able to swallow   Hypothyroidism -continue with Synthroid   GERD -Continue with Protonix in IV form   AKI on CKD -Continue with gentle hydration and avoid nephrotoxic medications   Leukocytosis -She is afebrile, will check UA and blood cultures   B12 deficiency -B12 level is low 207 once measured last month, will start on IM supplements -Pt will need monthly B12 injections to prevent further severe deficiencies   DNR present on admission -continue DNR order in hospital   Adult Failure to thrive -pt is residing in SNF    DVT prophylaxis: SCD Code Status: DNR  Family Communication:     Consultants:  GI service  Procedures:  TBD  Antimicrobials:    Subjective: Pt had large BM with streaks of bleed this morning.   Objective: Vitals:   03/15/23 0255 03/15/23 0317 03/15/23 0426 03/15/23 0540  BP: (!) 143/66 (!) 162/63 (!) 122/58 138/65  Pulse: 85 86 86 84  Resp: (!) 22 (!) 22 18 20   Temp: 98.6 F (37 C) 98.1 F (36.7 C) 98.6 F (37 C) 98.1 F (36.7 C)  TempSrc:      SpO2: 97% 96% 92% 96%  Weight:      Height:        Intake/Output Summary (Last 24 hours) at 03/15/2023 1051 Last data filed at 03/15/2023 0600 Gross per 24 hour  Intake 1801.31 ml  Output 950 ml  Net 851.31  ml   Filed Weights   03/14/23 1253  Weight: 61.1 kg   Examination:  General exam: elderly frail female, awake, alert, Appears calm and comfortable  Respiratory system: Clear to auscultation. Respiratory effort normal. Cardiovascular system: normal S1 & S2 heard. No JVD, murmurs, rubs, gallops or clicks. No pedal edema. Gastrointestinal system: Abdomen is nondistended, soft and nontender. No organomegaly or masses felt. Normal bowel sounds heard. Central nervous system: Alert  and oriented. No focal neurological deficits. Extremities: Symmetric 5 x 5 power. Skin: No rashes, lesions or ulcers. Psychiatry: Judgement and insight appear normal. Mood & affect appropriate.   Data Reviewed: I have personally reviewed following labs and imaging studies  CBC: Recent Labs  Lab 03/14/23 0955 03/14/23 1310 03/14/23 1804 03/14/23 2211 03/15/23 0737  WBC 16.7* 16.1* 11.7*  --  10.9*  NEUTROABS 11.6* 9.7*  --   --   --   HGB 9.5* 8.5* 8.0* 7.2* 12.2  HCT 28.1* 26.3* 25.3* 22.3* 36.9  MCV 96.6 96.0 99.2  --  92.7  PLT 214 205 164  --  149*    Basic Metabolic Panel: Recent Labs  Lab 03/14/23 0955 03/14/23 1310 03/15/23 0737  NA 138 138 139  K 5.7* 4.9 3.9  CL 101 103 104  CO2 28 28 28   GLUCOSE 154* 140* 101*  BUN 34* 38* 25*  CREATININE 1.36* 1.35* 1.03*  CALCIUM 9.1 8.9 8.4*    CBG: No results for input(s): "GLUCAP" in the last 168 hours.  Recent Results (from the past 240 hour(s))  Culture, blood (Routine X 2) w Reflex to ID Panel     Status: None (Preliminary result)   Collection Time: 03/14/23  6:04 PM   Specimen: BLOOD  Result Value Ref Range Status   Specimen Description BLOOD BLOOD RIGHT HAND  Final   Special Requests   Final    BOTTLES DRAWN AEROBIC ONLY Blood Culture adequate volume   Culture   Final    NO GROWTH < 24 HOURS Performed at [facility], [address], [place], [place] [address]    Report Status PENDING  Incomplete  Culture, blood (Routine X 2) w Reflex to ID Panel     Status: None (Preliminary result)   Collection Time: 03/14/23  6:05 PM   Specimen: BLOOD  Result Value Ref Range Status   Specimen Description BLOOD BLOOD LEFT ARM  Final   Special Requests   Final    BOTTLES DRAWN AEROBIC AND ANAEROBIC Blood Culture adequate volume   Culture   Final    NO GROWTH < 24 HOURS Performed at [facility], [address], [place], [place] [address]    Report Status PENDING  Incomplete     Radiology Studies: CT ABDOMEN  PELVIS WO CONTRAST  Result Date: 03/14/2023 CLINICAL DATA:  Rectal bleeding and lethargy EXAM: CT ABDOMEN AND PELVIS WITHOUT CONTRAST TECHNIQUE: Multidetector CT imaging of the abdomen and pelvis was performed following the standard protocol without IV contrast. RADIATION DOSE REDUCTION: This exam was performed according to the departmental dose-optimization program which includes automated exposure control, adjustment of the mA and/or kV according to patient size and/or use of iterative reconstruction technique. COMPARISON:  None Available. FINDINGS: Lower chest: Diffuse peripheral reticulations and bilateral lower lobe traction bronchiectasis with scattered subsegmental mucous plugging. Irregular left lower lobe nodule measures 6 x 5 mm (4:16). No pleural effusion or pneumothorax demonstrated. Partially imaged heart size is normal. Coronary artery calcifications. Hepatobiliary: No focal hepatic lesions. No intra or extrahepatic  biliary ductal dilation. Normal gallbladder. Pancreas: No focal lesions or main ductal dilation. Spleen: Normal in size without focal abnormality. Adrenals/Urinary Tract: No adrenal nodules. No suspicious renal mass, calculi or hydronephrosis. No focal bladder wall thickening. Stomach/Bowel: Normal appearance of the stomach. Large duodenal diverticulum. No evidence of bowel wall thickening or inflammatory changes. Colonic diverticulosis without acute diverticulitis. Rectum is mildly distended with stool. Appendix is not discretely seen. Vascular/Lymphatic: Aortic atherosclerosis. No enlarged abdominal or pelvic lymph nodes. Reproductive: No adnexal masses. Other: Mild presacral edema.  No free air. Musculoskeletal: No acute or abnormal lytic or blastic osseous lesions. Multilevel degenerative changes of the partially imaged thoracic and lumbar spine. IMPRESSION: 1. No acute abnormality in the abdomen or pelvis. 2. Colonic diverticulosis without acute diverticulitis. Mildly distended  rectum containing stool. 3. Diffuse peripheral reticulations and bilateral lower lobe traction bronchiectasis with scattered subsegmental mucous plugging, suggestive of chronic interstitial lung disease. 4. Irregular left lower lobe lung nodule measures 6 mm. Non-contrast chest CT at 6-12 months is recommended. If the nodule is stable at time of repeat CT, then future CT at 18-24 months (from today's scan) is considered optional for low-risk patients, but is recommended for high-risk patients. This recommendation follows the consensus statement: Guidelines for Management of Incidental Pulmonary Nodules Detected on CT Images: From the Fleischner Society 2017; Radiology 2017; 284:228-243. 5. Aortic Atherosclerosis (ICD10-I70.0). Coronary artery calcifications. Assessment for potential risk factor modification, dietary therapy or pharmacologic therapy may be warranted, if clinically indicated. Electronically Signed   By: Agustin Cree M.D.   On: 03/14/2023 14:50    Scheduled Meds:  bisacodyl  10 mg Rectal BID   cyanocobalamin  1,000 mcg Subcutaneous Daily   levothyroxine  50 mcg Oral QAC breakfast   polyethylene glycol  17 g Oral Daily   Continuous Infusions:  lactated ringers 50 mL/hr at 03/15/23 0951     LOS: 0 days   Time spent: 36 mins  Corene Resnick Laural Benes, MD How to contact the [facility] Attending or Consulting provider 7A - 7P or covering provider during after hours 7P -7A, for this patient?  Check the care team in [facility] and look for a) attending/consulting TRH provider listed and b) the [facility] team listed Log into [email or web address] and use [facility]'s universal password to access. If you do not have the password, please contact the hospital operator. Locate the [facility] provider you are looking for under [facility] and page to a number that you can be directly reached. If you still have difficulty reaching the provider, please page the [facility] (Director on Call) for the Hospitalists listed on amion for  assistance.  03/15/2023, 10:51 AM

## 2023-03-15 NOTE — Evaluation (Signed)
Physical Therapy Evaluation Patient Details Name: Vickie Mckee MRN: [id number] DOB: [birth year] Today's Date: 03/15/2023  History of Present Illness  Vickie Mckee  is a 89 y.o. female, with medical history significant of essential hypertension, hypothyroidism, gastroesophageal reflux disease, depression, vitamin D deficiency, history of HOCM and chronic low back pain, history of CVA with left-sided weakness in 2022, since then she is SNF resident.  -Is very poor historian due to dementia, history was obtained with assistance with daughter, and ED staff, patient presents to ED secondary to bright red blood per rectum, patient with severe constipation, requiring manual disimpaction at facility couple days ago, as well daughter reports patient has been more lethargic over the last 4 days, she was started on MiraLAX at her facility, she was noted to have a drop in her hemoglobin, her baseline 13.5 last month, it is 8.5 on admissio.  -In ED labs confirm anemia with hemoglobin of 8.5, she was noted to have external hemorrhoid with bright red blood per rectum by ED physician, her CT abdomen pelvis noted for diverticulosis, [facility] hospitalist consulted to admit.   Clinical Impression  Patient demonstrates slow labored movement for sitting up at bedside with limited use of LUE and moving LLE due to baseline weakness from old CVA, very unsteady on feet and limited to a few shuffling side steps to transfer to chair with mostly Mod/max assist.  Patient tolerated sitting up in chair with mechanical lift harness in place after therapy - nursing staff notified. Patient will benefit from continued skilled physical therapy in hospital and recommended venue below to increase strength, balance, endurance for safe ADLs and gait.          Recommendations for follow up therapy are one component of a multi-disciplinary discharge planning process, led by the attending physician.  Recommendations may be updated based on  patient status, additional functional criteria and insurance authorization.  Follow Up Recommendations Can patient physically be transported by private vehicle: No     Assistance Recommended at Discharge    Patient can return home with the following  A lot of help with bathing/dressing/bathroom;A lot of help with walking and/or transfers;Help with stairs or ramp for entrance;Assistance with cooking/housework    Equipment Recommendations None recommended by PT  Recommendations for Other Services       Functional Status Assessment Patient has had a recent decline in their functional status and demonstrates the ability to make significant improvements in function in a reasonable and predictable amount of time.     Precautions / Restrictions Precautions Precautions: Fall Restrictions Weight Bearing Restrictions: No      Mobility  Bed Mobility Overal bed mobility: Needs Assistance Bed Mobility: Rolling, Sit to Supine Rolling: Independent     Sit to supine: Mod assist   General bed mobility comments: increased time, labored movment with limited use of LUE due to weakness    Transfers Overall transfer level: Needs assistance Equipment used: Rolling walker (2 wheels) Transfers: Sit to/from Stand, Bed to chair/wheelchair/BSC Sit to Stand: Mod assist, Max assist   Step pivot transfers: Mod assist, Max assist       General transfer comment: unsteady labored movement with diffiuclty advancing LLE due to weakness    Ambulation/Gait Ambulation/Gait assistance: Max assist Gait Distance (Feet): 3 Feet Assistive device: Rolling walker (2 wheels) Gait Pattern/deviations: Decreased step length - right, Decreased step length - left, Decreased stride length, Antalgic, Shuffle Gait velocity: slow     General Gait Details: limited to a few  slow labored side steps with mostly shuffling of LLE due to weakness  Stairs            Wheelchair Mobility    Modified Rankin  (Stroke Patients Only)       Balance Overall balance assessment: Needs assistance Sitting-balance support: Feet supported, No upper extremity supported Sitting balance-Leahy Scale: Poor Sitting balance - Comments: fair/poor seated at EOB Postural control: Posterior lean, Right lateral lean Standing balance support: Reliant on assistive device for balance, During functional activity, Bilateral upper extremity supported Standing balance-Leahy Scale: Poor Standing balance comment: using RW                             Pertinent Vitals/Pain Pain Assessment Pain Assessment: No/denies pain    Home Living Family/patient expects to be discharged to:: Skilled nursing facility                        Prior Function Prior Level of Function : Needs assist       Physical Assist : Mobility (physical);ADLs (physical) Mobility (physical): Bed mobility;Transfers;Gait ADLs (physical): Grooming;Bathing;Dressing;Toileting;IADLs Mobility Comments: Per pt she spends most of her time at her facility in a w/c, assist required for all t/f ADLs Comments: Pt able to wash face and self feed with set up. Dependent for toileting, moderate assist for bathing and dressing     Hand Dominance   Dominant Hand: Right    Extremity/Trunk Assessment   Upper Extremity Assessment Upper Extremity Assessment: Defer to OT evaluation LUE Deficits / Details: Pt with hx of CVA, L side affected. Pt able to lightly squeeze OT hand, no movment with shoulder flexion/abduction noted    Lower Extremity Assessment Lower Extremity Assessment: Generalized weakness;LLE deficits/detail LLE Deficits / Details: grossly -3/5 LLE Sensation: decreased light touch LLE Coordination: decreased fine motor;decreased gross motor    Cervical / Trunk Assessment Cervical / Trunk Assessment: Normal  Communication   Communication: No difficulties  Cognition Arousal/Alertness: Awake/alert Behavior During Therapy:  WFL for tasks assessed/performed Overall Cognitive Status: History of cognitive impairments - at baseline                                          General Comments      Exercises     Assessment/Plan    PT Assessment Patient needs continued PT services  PT Problem List Decreased strength;Decreased activity tolerance;Decreased balance;Decreased mobility       PT Treatment Interventions DME instruction;Gait training;Functional mobility training;Therapeutic activities;Therapeutic exercise;Patient/family education;Balance training    PT Goals (Current goals can be found in the Care Plan section)  Acute Rehab PT Goals Patient Stated Goal: return home with SNF staff to assist PT Goal Formulation: With patient Time For Goal Achievement: 03/29/23 Potential to Achieve Goals: Fair    Frequency Min 3X/week     Co-evaluation PT/OT/SLP Co-Evaluation/Treatment: Yes Reason for Co-Treatment: To address functional/ADL transfers PT goals addressed during session: Mobility/safety with mobility;Balance;Proper use of DME OT goals addressed during session: ADL's and self-care       AM-PAC PT "6 Clicks" Mobility  Outcome Measure Help needed turning from your back to your side while in a flat bed without using bedrails?: None Help needed moving from lying on your back to sitting on the side of a flat bed without using bedrails?: A Lot Help  needed moving to and from a bed to a chair (including a wheelchair)?: A Lot Help needed standing up from a chair using your arms (e.g., wheelchair or bedside chair)?: A Lot Help needed to walk in hospital room?: A Lot Help needed climbing 3-5 steps with a railing? : Total 6 Click Score: 13    End of Session   Activity Tolerance: Patient tolerated treatment well;Patient limited by fatigue Patient left: in chair;with call bell/phone within reach Nurse Communication: Mobility status PT Visit Diagnosis: Unsteadiness on feet (R26.81);Other  abnormalities of gait and mobility (R26.89);Muscle weakness (generalized) (M62.81)    Time: 1610-9604 PT Time Calculation (min) (ACUTE ONLY): 18 min   Charges:   PT Evaluation $PT Eval Low Complexity: 1 Low PT Treatments $Therapeutic Activity: 8-22 mins        12:48 PM, 03/15/23 Ocie Bob, MPT Physical Therapist with [facility] [phone] office [phone] mobile phone

## 2023-03-15 NOTE — Hospital Course (Signed)
89 y.o. female, with medical history significant of essential hypertension, hypothyroidism, gastroesophageal reflux disease, depression, vitamin D deficiency, history of HOCM and chronic low back pain, history of CVA with left-sided weakness in 2022, since then she is SNF resident. -Is very poor historian due to dementia, history was obtained with assistance with daughter, and ED staff, patient presents to ED secondary to bright red blood per rectum, patient with severe constipation, requiring manual disimpaction at facility couple days ago, as well daughter reports patient has been more lethargic over the last 4 days, she was started on MiraLAX at her facility, she was noted to have a drop in her hemoglobin, her baseline 13.5 last month, it is 8.5 on admissio. -In ED labs confirm anemia with hemoglobin of 8.5, she was noted to have external hemorrhoid with bright red blood per rectum by ED physician, her CT abdomen pelvis noted for diverticulosis, [facility] hospitalist consulted to admit.

## 2023-03-15 NOTE — TOC Initial Note (Signed)
Transition of Care [facility]) - Initial/Assessment Note    Patient Details  Name: Vickie Mckee MRN: [id number] Date of Birth: 05-08-1931  Transition of Care [facility]) CM/SW Contact:    Leitha Bleak, RN Phone Number: 03/15/2023, 11:24 AM  Clinical Narrative:     Patient admitted in OBS for bright red blood per rectum. Patient is from [facility]- private pay. She is in a non-certified bed. PT is recommending SNF.  Lynnea Ferrier spoke with family to explain options. They want to readmit under [facility] for SNF. TOC starting INS AUTH, MD updated.         Expected Discharge Plan: Skilled Nursing Facility Barriers to Discharge: Continued Medical Work up   Patient Goals and CMS Choice Patient states their goals for this hospitalization and ongoing recovery are:: return to [facility] CMS Medicare.gov Compare Post Acute Care list provided to:: Patient Represenative (must comment) Choice offered to / list presented to : Adult Children      Expected Discharge Plan and Services        Prior Living Arrangements/Services   Lives with:: Facility Resident     Activities of Daily Living Home Assistive Devices/Equipment: Wheelchair ADL Screening (condition at time of admission) Patient's cognitive ability adequate to safely complete daily activities?: No Is the patient deaf or have difficulty hearing?: No Does the patient have difficulty seeing, even when wearing glasses/contacts?: No Does the patient have difficulty concentrating, remembering, or making decisions?: Yes Patient able to express need for assistance with ADLs?: No Does the patient have difficulty dressing or bathing?: Yes Independently performs ADLs?: No Communication: Independent Dressing (OT): Needs assistance Is this a change from baseline?: Pre-admission baseline Grooming: Independent with device (comment) Feeding: Independent Bathing: Needs assistance Is this a change from baseline?: Pre-admission baseline Toileting: Dependent Is this a change  from baseline?: Pre-admission baseline In/Out Bed: Dependent Is this a change from baseline?: Pre-admission baseline Walks in Home: Dependent Is this a change from baseline?: Pre-admission baseline Does the patient have difficulty walking or climbing stairs?: Yes Weakness of Legs: Both Weakness of Arms/Hands: Both  Permission Sought/Granted    Emotional Assessment        Alcohol / Substance Use: Not Applicable Psych Involvement: No (comment)  Admission diagnosis:  Bright red blood per rectum [K62.5] Rectal bleeding [K62.5] AKI (acute kidney injury) (HCC) [N17.9] Patient Active Problem List   Diagnosis Date Noted   Acute on chronic renal failure (HCC) 03/14/2023   Acute anemia 03/14/2023   Leukocytosis 03/14/2023   Bright red blood per rectum 03/14/2023   History of stroke 05/10/2022   Vitamin D deficiency 02/16/2022   Vitamin B6 deficiency 02/16/2022   Vitamin B 12 deficiency 02/08/2022   Interstitial lung disease (HCC) 01/11/2022   Neurocognitive deficits 07/05/2021   CKD (chronic kidney disease), stage III (HCC) 07/05/2021   Vascular dementia without behavioral disturbance (HCC) 07/05/2021   Mixed dyslipidemia 07/05/2021   Psychosis in elderly without behavioral disturbance (HCC) 07/05/2021   Depression, recurrent (HCC) 07/05/2021   GERD without esophagitis 07/05/2021   Chronic constipation 07/05/2021   Somnolence 07/05/2021   Thoracic aortic atherosclerosis (HCC) 07/05/2021   Hypoalbuminemia due to protein-calorie malnutrition (HCC)    Hypokalemia    Dysphagia, post-stroke    Left hemiplegia (HCC) 06/01/2021   Essential hypertension    Hypothyroidism    HOCM (hypertrophic obstructive cardiomyopathy) (HCC) 07/23/2014   PCP:  Sharee Holster, NP Pharmacy:   [facility] - [place], [place] - [address]  [place] [address] Phone: [phone] Fax: [phone]     Social Determinants of Health  (SDOH) Social History: SDOH Screenings   Food Insecurity: No Food Insecurity (03/14/2023)  Housing: Low Risk  (03/14/2023)  Transportation Needs: No Transportation Needs (03/14/2023)  Utilities: Not At Risk (03/14/2023)  Depression (PHQ2-9): Low Risk  (01/29/2023)  Tobacco Use: Low Risk  (03/14/2023)   SDOH Interventions:    Readmission Risk Interventions     No data to display

## 2023-03-15 NOTE — Progress Notes (Signed)
Pt transferred from recliner back into bed. Bed linens changed and pt resting comfortably.

## 2023-03-15 NOTE — Evaluation (Signed)
Occupational Therapy Evaluation Patient Details Name: SAANVIKA MCPHAUL MRN: [id number] DOB: 1931/08/07 Today's Date: 03/15/2023   History of Present Illness Thella Mcclaine  is a 89 y.o. female, with medical history significant of essential hypertension, hypothyroidism, gastroesophageal reflux disease, depression, vitamin D deficiency, history of HOCM and chronic low back pain, history of CVA with left-sided weakness in 2022, since then she is SNF resident.  -Is very poor historian due to dementia, history was obtained with assistance with daughter, and ED staff, patient presents to ED secondary to bright red blood per rectum, patient with severe constipation, requiring manual disimpaction at facility couple days ago, as well daughter reports patient has been more lethargic over the last 4 days, she was started on MiraLAX at her facility, she was noted to have a drop in her hemoglobin, her baseline 13.5 last month, it is 8.5 on admissio.  -In ED labs confirm anemia with hemoglobin of 8.5, she was noted to have external hemorrhoid with bright red blood per rectum by ED physician, her CT abdomen pelvis noted for diverticulosis, [facility] hospitalist consulted to admit.   Clinical Impression   Pt was agreeable to OT/PT evaluation. Pt received in bed with bed alarm on. [facility]. Pt seems to be at baseline with ADLS, PLOF receiving mod/max assist for ADLS. Pt was able to roll from L to R in bed with use of bed rails and followed all verbal directions well. Pt was able to stand with mod/max assist and use of RW. She demonstrates poor balance in sitting and standing. Pt would benefit from continued OT services while in acute setting. Recommending pt d/c back to [facility] once medially stable.      Recommendations for follow up therapy are one component of a multi-disciplinary discharge planning process, led by the attending physician.  Recommendations may be  updated based on patient status, additional functional criteria and insurance authorization.   Assistance Recommended at Discharge Frequent or constant Supervision/Assistance  Patient can return home with the following A lot of help with walking and/or transfers;A lot of help with bathing/dressing/bathroom;Assistance with feeding;Assistance with cooking/housework    Functional Status Assessment  Patient has had a recent decline in their functional status and demonstrates the ability to make significant improvements in function in a reasonable and predictable amount of time.  Equipment Recommendations  None recommended by OT           Precautions / Restrictions Precautions Precautions: Fall Restrictions Weight Bearing Restrictions: No      Mobility Bed Mobility Overal bed mobility: Needs Assistance Bed Mobility: Rolling, Sit to Supine Rolling: Independent     Sit to supine: Mod assist   General bed mobility comments: Able to indepenedently roll with use of guard rails. Mod assist to sit up in bed    Transfers Overall transfer level: Needs assistance Equipment used: Rolling walker (2 wheels) Transfers: Sit to/from Stand, Bed to chair/wheelchair/BSC Sit to Stand: Mod assist, Max assist Stand pivot transfers: Mod assist, Max assist                Balance Overall balance assessment: Needs assistance Sitting-balance support: Bilateral upper extremity supported, Feet supported Sitting balance-Leahy Scale: Poor   Postural control: Posterior lean, Right lateral lean Standing balance support: Bilateral upper extremity supported, During functional activity, Reliant on assistive device for balance  ADL either performed or assessed with clinical judgement   ADL Overall ADL's : At baseline;Needs assistance/impaired Eating/Feeding: Set up   Grooming: Wash/dry face;Set up;Sitting   Upper Body Bathing: Moderate  assistance;Sitting   Lower Body Bathing: Maximal assistance;Sitting/lateral leans   Upper Body Dressing : Moderate assistance;Sitting   Lower Body Dressing: Maximal assistance;Sitting/lateral leans   Toilet Transfer: Maximal assistance   Toileting- Clothing Manipulation and Hygiene: Maximal assistance   Tub/ Shower Transfer: Moderate assistance;Stand-pivot;Shower seat   Functional mobility during ADLs: Total assistance;Maximal assistance       Vision Baseline Vision/History: 1 Wears glasses Ability to See in Adequate Light: 0 Adequate Patient Visual Report: No change from baseline Vision Assessment?: No apparent visual deficits     Perception Perception Perception Tested?: No   Praxis Praxis Praxis tested?: Not tested    Pertinent Vitals/Pain Pain Assessment Pain Assessment: No/denies pain     Hand Dominance Right   Extremity/Trunk Assessment Upper Extremity Assessment Upper Extremity Assessment: LUE deficits/detail LUE Deficits / Details: Pt with hx of CVA, L side affected. Pt able to lightly squeeze OT hand, no movment with shoulder flexion/abduction noted   Lower Extremity Assessment Lower Extremity Assessment: Defer to PT evaluation   Cervical / Trunk Assessment Cervical / Trunk Assessment: Normal   Communication Communication Communication: No difficulties   Cognition Arousal/Alertness: Awake/alert Behavior During Therapy: WFL for tasks assessed/performed Overall Cognitive Status: History of cognitive impairments - at baseline                                 General Comments: Pt has dementia at baseline. She was pleasant and cooperative                Home Living Family/patient expects to be discharged to:: Skilled nursing facility                                        Prior Functioning/Environment Prior Level of Function : Needs assist       Physical Assist : Mobility (physical);ADLs (physical) Mobility  (physical): Bed mobility;Transfers;Gait ADLs (physical): Grooming;Bathing;Dressing;Toileting;IADLs Mobility Comments: Per pt she spends most of her time at her facility in a w/c, assist required for all t/f ADLs Comments: Pt able to wash face and self feed with set up. Dependent for toileting, moderate assist for bathing and dressing        OT Problem List: Decreased strength;Decreased activity tolerance      OT Treatment/Interventions: Therapeutic exercise;Energy conservation;Self-care/ADL training;DME and/or AE instruction;Therapeutic activities;Patient/family education    OT Goals(Current goals can be found in the care plan section) Acute Rehab OT Goals Patient Stated Goal: return to [facility] OT Goal Formulation: With patient Time For Goal Achievement: 03/29/23 Potential to Achieve Goals: Good  OT Frequency: Min 2X/week    Co-evaluation PT/OT/SLP Co-Evaluation/Treatment: Yes Reason for Co-Treatment: To address functional/ADL transfers   OT goals addressed during session: ADL's and self-care      AM-PAC OT "6 Clicks" Daily Activity     Outcome Measure Help from another person eating meals?: A Little Help from another person taking care of personal grooming?: A Little Help from another person toileting, which includes using toliet, bedpan, or urinal?: Total Help from another person bathing (including washing, rinsing, drying)?: A Lot Help from another person to put on and taking off regular upper body  clothing?: A Lot Help from another person to put on and taking off regular lower body clothing?: Total 6 Click Score: 12   End of Session Equipment Utilized During Treatment: Rolling walker (2 wheels) Nurse Communication: Mobility status  Activity Tolerance: Patient tolerated treatment well Patient left: in chair;with chair alarm set;with call bell/phone within reach  OT Visit Diagnosis: Unsteadiness on feet (R26.81);Muscle weakness (generalized) (M62.81)                 Time: 1610-9604 OT Time Calculation (min): 54 min Charges:  OT General Charges $OT Visit: 1 Visit OT Evaluation $OT Eval Low Complexity: 1 Low OT Treatments $Self Care/Home Management : 23-37 mins  Bevelyn Ngo, OTR/L  03/15/2023, 10:01 AM

## 2023-03-15 NOTE — Plan of Care (Signed)
  Problem: Acute Rehab OT Goals (only OT should resolve) Goal: Pt. Will Perform Upper Body Bathing Flowsheets (Taken 03/15/2023 1021) Pt Will Perform Upper Body Bathing: with set-up Goal: Pt. Will Perform Upper Body Dressing Flowsheets (Taken 03/15/2023 1021) Pt Will Perform Upper Body Dressing: with set-up Goal: Pt. Will Perform Lower Body Dressing Flowsheets (Taken 03/15/2023 1021) Pt Will Perform Lower Body Dressing: with mod assist   Lurena Joiner, OTR/L

## 2023-03-15 NOTE — Progress Notes (Signed)
Gastroenterology Progress Note   Referring Provider: No ref. provider found Primary Care Physician:  Sharee Holster, NP Primary Gastroenterologist: unassigned  Patient ID: Vickie Mckee; [id number]; 1931/05/05    Subjective   Patient sleeping and very drowsy on my arrival into the room.  Patient's daughters at bedside.  They deny her having any complaints of nausea, vomiting, abdominal pain.  Per nursing she had very mild hematochezia with wiping today.  They report that she has not usually constipated.  They work with her on a daily basis with mobility exercises to keep her mobile as much as possible.  Objective   Vital signs in last 24 hours Temp:  [98 F (36.7 C)-98.9 F (37.2 C)] 98.1 F (36.7 C) (05/24 0540) Pulse Rate:  [59-88] 84 (05/24 0540) Resp:  [17-26] 20 (05/24 0540) BP: (117-162)/(54-70) 138/65 (05/24 0540) SpO2:  [92 %-100 %] 96 % (05/24 0540) Weight:  [61.1 kg] 61.1 kg (05/23 1253)    Physical Exam General: Drowsy, frail appearing Head:  Normocephalic and atraumatic. Abdomen:  Bowel sounds present, soft, non-tender, non-distended. No HSM or hernias noted. No rebound or guarding. No masses appreciated  Msk: Limited mobility to left upper and lower extremity (flaccid) Neurologic: Sleeping.  Orientation questions not addressed. Skin:  Warm and dry, intact without significant lesions.   Intake/Output from previous day: 05/23 0701 - 05/24 0700 In: 1801.3 [I.V.:896.3; Blood:905] Out: 950 [Urine:950] Intake/Output this shift: No intake/output data recorded.  Lab Results  Recent Labs    03/14/23 1310 03/14/23 1804 03/14/23 2211 03/15/23 0737  WBC 16.1* 11.7*  --  10.9*  HGB 8.5* 8.0* 7.2* 12.2  HCT 26.3* 25.3* 22.3* 36.9  PLT 205 164  --  149*   BMET Recent Labs    03/14/23 0955 03/14/23 1310 03/15/23 0737  NA 138 138 139  K 5.7* 4.9 3.9  CL 101 103 104  CO2 28 28 28   GLUCOSE 154* 140* 101*  BUN 34* 38* 25*  CREATININE 1.36* 1.35* 1.03*   CALCIUM 9.1 8.9 8.4*   LFT Recent Labs    03/14/23 0955 03/14/23 1310  PROT 6.0* 5.8*  ALBUMIN 3.4* 3.2*  AST 15 16  ALT 12 13  ALKPHOS 34* 35*  BILITOT 0.9 0.6   PT/INR Recent Labs    03/14/23 1307  LABPROT 15.2  INR 1.2   Hepatitis Panel No results for input(s): "HEPBSAG", "HCVAB", "HEPAIGM", "HEPBIGM" in the last 72 hours.  Studies/Results CT ABDOMEN PELVIS WO CONTRAST  Result Date: 03/14/2023 CLINICAL DATA:  Rectal bleeding and lethargy EXAM: CT ABDOMEN AND PELVIS WITHOUT CONTRAST TECHNIQUE: Multidetector CT imaging of the abdomen and pelvis was performed following the standard protocol without IV contrast. RADIATION DOSE REDUCTION: This exam was performed according to the departmental dose-optimization program which includes automated exposure control, adjustment of the mA and/or kV according to patient size and/or use of iterative reconstruction technique. COMPARISON:  None Available. FINDINGS: Lower chest: Diffuse peripheral reticulations and bilateral lower lobe traction bronchiectasis with scattered subsegmental mucous plugging. Irregular left lower lobe nodule measures 6 x 5 mm (4:16). No pleural effusion or pneumothorax demonstrated. Partially imaged heart size is normal. Coronary artery calcifications. Hepatobiliary: No focal hepatic lesions. No intra or extrahepatic biliary ductal dilation. Normal gallbladder. Pancreas: No focal lesions or main ductal dilation. Spleen: Normal in size without focal abnormality. Adrenals/Urinary Tract: No adrenal nodules. No suspicious renal mass, calculi or hydronephrosis. No focal bladder wall thickening. Stomach/Bowel: Normal appearance of the stomach. Large duodenal diverticulum. No  evidence of bowel wall thickening or inflammatory changes. Colonic diverticulosis without acute diverticulitis. Rectum is mildly distended with stool. Appendix is not discretely seen. Vascular/Lymphatic: Aortic atherosclerosis. No enlarged abdominal or pelvic  lymph nodes. Reproductive: No adnexal masses. Other: Mild presacral edema.  No free air. Musculoskeletal: No acute or abnormal lytic or blastic osseous lesions. Multilevel degenerative changes of the partially imaged thoracic and lumbar spine. IMPRESSION: 1. No acute abnormality in the abdomen or pelvis. 2. Colonic diverticulosis without acute diverticulitis. Mildly distended rectum containing stool. 3. Diffuse peripheral reticulations and bilateral lower lobe traction bronchiectasis with scattered subsegmental mucous plugging, suggestive of chronic interstitial lung disease. 4. Irregular left lower lobe lung nodule measures 6 mm. Non-contrast chest CT at 6-12 months is recommended. If the nodule is stable at time of repeat CT, then future CT at 18-24 months (from today's scan) is considered optional for low-risk patients, but is recommended for high-risk patients. This recommendation follows the consensus statement: Guidelines for Management of Incidental Pulmonary Nodules Detected on CT Images: From the Fleischner Society 2017; Radiology 2017; 284:228-243. 5. Aortic Atherosclerosis (ICD10-I70.0). Coronary artery calcifications. Assessment for potential risk factor modification, dietary therapy or pharmacologic therapy may be warranted, if clinically indicated. Electronically Signed   By: Agustin Cree M.D.   On: 03/14/2023 14:50    Assessment  89 y.o. female with a history of vascular dementia, HOCM, GERD, HTN, hypothyroidism, IBS, depression who presented with lethargy, rectal bleeding, significant decline in hemoglobin.  GI consulted for rectal bleeding.  Rectal bleeding: Reportedly had several days of constipation which required disimpaction at SNF.  SNF staff noted blood in her stool since disimpaction.  Had not received any MiraLAX in 3 days.  Hemoglobin on admission was declined to 9.5 from normal at 13.4 a month earlier.  She was noted to have red blood on rectal exam but without any significant bleeding  since arriving to the hospital.  Continue to suspect benign anorectal source in the setting of recent disimpaction and constipation.  Most likely has hemorrhoids but unable to exclude diverticular bleed given CT findings of colonic diverticulosis.  Focus should be on continuing to avoid constipation therefore she will need regular bowel regimen.  Will recommend treating for hemorrhoids with antihemorrhoid cream twice daily for 5-7 days.  She has received IV hydration which likely resulted in further drop in her hemoglobin since admission.  She received 2 units PRBCs overnight/early this morning with repeat hemoglobin 12.2.  Reassessment at lunch today with hemoglobin stable at 11.8.  Given we are not completing any procedures at this time we can slowly advance her diet.  Will start with full liquids and if she tolerates this well then her diet may be advanced.   Plan / Recommendations  Miralax 17 g daily Treat supportively Full liquid diet, advance as tolerated. Hemorrhoid cream BID for 5-7 days Trend H/H, transfuse for hgb <7 Monitor for over GI bleeding.  Continue to hold on any endoscopic evaluation at this time given her frailty.  Will ultimately readdress need for endoscopic evaluation if she experiences another large drop in her hemoglobin or has recurrent large-volume rectal bleeding.    LOS: 0 days   03/15/2023, 9:56 AM   Brooke Bonito, MSN, FNP-BC, AGACNP-BC [facility]

## 2023-03-15 NOTE — Plan of Care (Signed)
  Problem: Acute Rehab PT Goals(only PT should resolve) Goal: Pt Will Go Supine/Side To Sit Outcome: Progressing Flowsheets (Taken 03/15/2023 1248) Pt will go Supine/Side to Sit:  with minimal assist  with moderate assist Goal: Patient Will Transfer Sit To/From Stand Outcome: Progressing Flowsheets (Taken 03/15/2023 1248) Patient will transfer sit to/from stand:  with minimal assist  with moderate assist Goal: Pt Will Transfer Bed To Chair/Chair To Bed Outcome: Progressing Flowsheets (Taken 03/15/2023 1248) Pt will Transfer Bed to Chair/Chair to Bed: with mod assist Goal: Pt Will Ambulate Outcome: Progressing Flowsheets (Taken 03/15/2023 1248) Pt will Ambulate:  10 feet  with moderate assist  with rolling walker   12:49 PM, 03/15/23 Ocie Bob, MPT Physical Therapist with [facility] [phone] office [phone] mobile phone

## 2023-03-16 DIAGNOSIS — K625 Hemorrhage of anus and rectum: Secondary | ICD-10-CM | POA: Diagnosis not present

## 2023-03-16 DIAGNOSIS — K5909 Other constipation: Secondary | ICD-10-CM | POA: Diagnosis not present

## 2023-03-16 DIAGNOSIS — N179 Acute kidney failure, unspecified: Secondary | ICD-10-CM | POA: Diagnosis not present

## 2023-03-16 DIAGNOSIS — D649 Anemia, unspecified: Secondary | ICD-10-CM | POA: Diagnosis not present

## 2023-03-16 LAB — CBC
HCT: 34.5 % — ABNORMAL LOW (ref 36.0–46.0)
Hemoglobin: 11.4 g/dL — ABNORMAL LOW (ref 12.0–15.0)
MCH: 30.9 pg (ref 26.0–34.0)
MCHC: 33 g/dL (ref 30.0–36.0)
MCV: 93.5 fL (ref 80.0–100.0)
Platelets: 151 10*3/uL (ref 150–400)
RBC: 3.69 MIL/uL — ABNORMAL LOW (ref 3.87–5.11)
RDW: 15.1 % (ref 11.5–15.5)
WBC: 8.5 10*3/uL (ref 4.0–10.5)
nRBC: 0 % (ref 0.0–0.2)

## 2023-03-16 LAB — CULTURE, BLOOD (ROUTINE X 2)

## 2023-03-16 LAB — BASIC METABOLIC PANEL
Anion gap: 8 (ref 5–15)
BUN: 18 mg/dL (ref 8–23)
CO2: 27 mmol/L (ref 22–32)
Calcium: 8.2 mg/dL — ABNORMAL LOW (ref 8.9–10.3)
Chloride: 103 mmol/L (ref 98–111)
Creatinine, Ser: 0.89 mg/dL (ref 0.44–1.00)
GFR, Estimated: >60 mL/min (ref >60)
Glucose, Bld: 99 mg/dL (ref 70–99)
Potassium: 3.6 mmol/L (ref 3.5–5.1)
Sodium: 138 mmol/L (ref 135–145)

## 2023-03-16 LAB — MAGNESIUM: Magnesium: 2.3 mg/dL (ref 1.7–2.4)

## 2023-03-16 MED ORDER — HALOPERIDOL LACTATE 5 MG/ML IJ SOLN
2.0000 mg | Freq: Once | INTRAMUSCULAR | Status: AC
  Administered 2023-03-16: 2 mg via INTRAVENOUS
  Filled 2023-03-16: qty 1

## 2023-03-16 MED ORDER — MEMANTINE HCL ER 28 MG PO CP24
28.0000 mg | ORAL_CAPSULE | Freq: Every day | ORAL | Status: DC
  Administered 2023-03-17: 28 mg via ORAL
  Filled 2023-03-16 (×3): qty 1

## 2023-03-16 MED ORDER — PANTOPRAZOLE SODIUM 40 MG PO TBEC
40.0000 mg | DELAYED_RELEASE_TABLET | Freq: Every day | ORAL | Status: DC
  Administered 2023-03-16: 40 mg via ORAL
  Filled 2023-03-16: qty 1

## 2023-03-16 MED ORDER — QUETIAPINE FUMARATE 25 MG PO TABS
12.5000 mg | ORAL_TABLET | Freq: Every day | ORAL | Status: DC
  Administered 2023-03-16: 12.5 mg via ORAL
  Filled 2023-03-16: qty 1

## 2023-03-16 MED ORDER — HALOPERIDOL LACTATE 5 MG/ML IJ SOLN: 1.0000 mg | Freq: Four times a day (QID) | INTRAMUSCULAR | Status: DC | PRN

## 2023-03-16 MED ORDER — SALINE SPRAY 0.65 % NA SOLN
1.0000 | Freq: Three times a day (TID) | NASAL | Status: DC
  Administered 2023-03-17: 1 via NASAL
  Filled 2023-03-16: qty 44

## 2023-03-16 MED ORDER — SERTRALINE HCL 50 MG PO TABS
50.0000 mg | ORAL_TABLET | Freq: Every day | ORAL | Status: DC
  Administered 2023-03-16 – 2023-03-17 (×2): 50 mg via ORAL
  Filled 2023-03-16 (×2): qty 1

## 2023-03-16 NOTE — Progress Notes (Signed)
PROGRESS NOTE   Vickie Mckee  ZOX:[id number] DOB: Feb 28, 1931 DOA: 03/14/2023 PCP: Sharee Holster, NP   Chief Complaint  Patient presents with   Rectal Bleeding   Level of care: Telemetry  Brief Admission History:  89 y.o. female, with medical history significant of essential hypertension, hypothyroidism, gastroesophageal reflux disease, depression, vitamin D deficiency, history of HOCM and chronic low back pain, history of CVA with left-sided weakness in 2022, since then she is SNF resident. -Is very poor historian due to dementia, history was obtained with assistance with daughter, and ED staff, patient presents to ED secondary to bright red blood per rectum, patient with severe constipation, requiring manual disimpaction at facility couple days ago, as well daughter reports patient has been more lethargic over the last 4 days, she was started on MiraLAX at her facility, she was noted to have a drop in her hemoglobin, her baseline 13.5 last month, it is 8.5 on admissio. -In ED labs confirm anemia with hemoglobin of 8.5, she was noted to have external hemorrhoid with bright red blood per rectum by ED physician, her CT abdomen pelvis noted for diverticulosis, [facility] hospitalist consulted to admit.    Assessment and Plan:  Acute blood loss anemia due to lower GI bleed/bright red blood per rectum -Baseline hemoglobin 13.5, it is 8.5 this admission, she presents with bright red blood per rectum, most likely diverticular bleed. -CBC being followed -s/p 2 units PRBC and Hg up to 12 from 7.2 on 03/14/23.  -GI consulted, recommend conservative mgmt now due to advanced age and comorbidities -recheck CBC in AM  -fortunately Hg stable last 24 hours -if stable 5/26 likely could discharge back to skilled nursing care home   Acute encephalopathy, unspecified -She was more somnolent and confused reportedly than baseline, she is with known dementia -She is with leukocytosis, will rule out infectious  process with UA, will check urine cultures -Hold all sedative medications. -Mentation waxing and waning due to acute hospital delirium -delirium precautions    Leukocytosis - reactive to GI hemorrhage - no s/s of infection found  - WBC normalized    History of CVA with residual left-sided weakness-continue with supportive care   History of dementia -Resume her meds when she is more awake and able to swallow   Hypothyroidism -continue with levothyroxine   GERD -Continue with pantoprazole   AKI on CKD -Continue with gentle hydration and avoid nephrotoxic medications   Leukocytosis -She is afebrile, will check UA and blood cultures   B12 deficiency -B12 level is low 207 once measured last month, will start on IM supplements -Pt will need monthly B12 injections to prevent further severe deficiencies   DNR present on admission -continue DNR order in hospital   Adult Failure to thrive -pt is residing in SNF    DVT prophylaxis: SCD Code Status: DNR  Family Communication:     Consultants:  GI service  Procedures:  TBD  Antimicrobials:    Subjective: Pt was medicated due to sundowning acute delirium   Objective: Vitals:   03/15/23 1322 03/15/23 1920 03/16/23 0419 03/16/23 1354  BP: 132/66 129/61 133/69 (!) 158/54  Pulse: 80 84 62 85  Resp: 18 18 18 17   Temp: 98.6 F (37 C) 98.3 F (36.8 C) 98 F (36.7 C) 98.8 F (37.1 C)  TempSrc: Oral  Oral Oral  SpO2: 96% 99% 94% 98%  Weight:      Height:        Intake/Output Summary (Last 24 hours)  at 03/16/2023 1701 Last data filed at 03/16/2023 1400 Gross per 24 hour  Intake 967.19 ml  Output 600 ml  Net 367.19 ml   Filed Weights   03/14/23 1253  Weight: 61.1 kg   Examination:  General exam: elderly frail female, awake, somnolent Appears calm and comfortable  Respiratory system: Clear to auscultation. Respiratory effort normal. Cardiovascular system: normal S1 & S2 heard. No JVD, murmurs, rubs, gallops or  clicks. No pedal edema. Gastrointestinal system: Abdomen is nondistended, soft and nontender. No organomegaly or masses felt. Normal bowel sounds heard. Central nervous system: somnolent. No focal neurological deficits. Extremities: Symmetric 5 x 5 power. Skin: No rashes, lesions or ulcers. Psychiatry: Judgement and insight appear UTD. Mood & affect appropriate.   Data Reviewed: I have personally reviewed following labs and imaging studies  CBC: Recent Labs  Lab 03/14/23 0955 03/14/23 1310 03/14/23 1804 03/14/23 2211 03/15/23 0737 03/15/23 1225 03/16/23 0458  WBC 16.7* 16.1* 11.7*  --  10.9*  --  8.5  NEUTROABS 11.6* 9.7*  --   --   --   --   --   HGB 9.5* 8.5* 8.0* 7.2* 12.2 11.8* 11.4*  HCT 28.1* 26.3* 25.3* 22.3* 36.9 35.2* 34.5*  MCV 96.6 96.0 99.2  --  92.7  --  93.5  PLT 214 205 164  --  149*  --  151    Basic Metabolic Panel: Recent Labs  Lab 03/14/23 0955 03/14/23 1310 03/15/23 0737 03/16/23 0458  NA 138 138 139 138  K 5.7* 4.9 3.9 3.6  CL 101 103 104 103  CO2 28 28 28 27   GLUCOSE 154* 140* 101* 99  BUN 34* 38* 25* 18  CREATININE 1.36* 1.35* 1.03* 0.89  CALCIUM 9.1 8.9 8.4* 8.2*  MG  --   --   --  2.3    CBG: No results for input(s): "GLUCAP" in the last 168 hours.  Recent Results (from the past 240 hour(s))  Culture, blood (Routine X 2) w Reflex to ID Panel     Status: None (Preliminary result)   Collection Time: 03/14/23  6:04 PM   Specimen: BLOOD  Result Value Ref Range Status   Specimen Description BLOOD BLOOD RIGHT HAND  Final   Special Requests   Final    BOTTLES DRAWN AEROBIC ONLY Blood Culture adequate volume   Culture   Final    NO GROWTH 2 DAYS Performed at [facility], [address], [place], [place] [address]    Report Status PENDING  Incomplete  Culture, blood (Routine X 2) w Reflex to ID Panel     Status: None (Preliminary result)   Collection Time: 03/14/23  6:05 PM   Specimen: BLOOD  Result Value Ref Range Status   Specimen  Description BLOOD BLOOD LEFT ARM  Final   Special Requests   Final    BOTTLES DRAWN AEROBIC AND ANAEROBIC Blood Culture adequate volume   Culture   Final    NO GROWTH 2 DAYS Performed at [facility], [address] [place], [place] [address]    Report Status PENDING  Incomplete     Radiology Studies: No results found.  Scheduled Meds:  bisacodyl  10 mg Rectal BID   cyanocobalamin  1,000 mcg Subcutaneous Daily   levothyroxine  50 mcg Oral QAC breakfast   polyethylene glycol  17 g Oral Daily   QUEtiapine  12.5 mg Oral QHS   Continuous Infusions:   LOS: 1 day   Time spent: 35 mins  Standley Dakins, MD How to contact the [facility] Attending or Consulting provider 7A - 7P or covering provider during after hours 7P -7A, for this patient?  Check the care team in [facility] and look for a) attending/consulting [facility] provider listed and b) the [facility] team listed Log into [email or web address] and use [place]'s universal password to access. If you do not have the password, please contact the hospital operator. Locate the [facility] provider you are looking for under [facility] and page to a number that you can be directly reached. If you still have difficulty reaching the provider, please page the [facility] (Director on Call) for the Hospitalists listed on amion for assistance.  03/16/2023, 5:01 PM

## 2023-03-16 NOTE — Progress Notes (Signed)
Patient observed with combativeness, agitation, irritability,and anxiousness throughout night. Reported to have "grabbed and threw fecal matter at staff while they were providing incontinent and peri care." Patient pulled out IV and tolerated replacement. Provider notified and made aware. Haldol 2 mg IV X 1 ordered and administered. Pt resting calmly in bed at this time.

## 2023-03-17 DIAGNOSIS — F0150 Vascular dementia without behavioral disturbance: Secondary | ICD-10-CM | POA: Diagnosis not present

## 2023-03-17 DIAGNOSIS — K625 Hemorrhage of anus and rectum: Secondary | ICD-10-CM | POA: Diagnosis not present

## 2023-03-17 DIAGNOSIS — K5909 Other constipation: Secondary | ICD-10-CM | POA: Diagnosis not present

## 2023-03-17 DIAGNOSIS — K219 Gastro-esophageal reflux disease without esophagitis: Secondary | ICD-10-CM | POA: Diagnosis not present

## 2023-03-17 LAB — BPAM RBC
ISSUE DATE / TIME: 202405232351
Unit Type and Rh: 5100
Unit Type and Rh: 5100

## 2023-03-17 LAB — TYPE AND SCREEN
Antibody Screen: NEGATIVE
Unit division: 0

## 2023-03-17 LAB — CBC
HCT: 37.5 % (ref 36.0–46.0)
Hemoglobin: 12.2 g/dL (ref 12.0–15.0)
MCH: 30.5 pg (ref 26.0–34.0)
MCHC: 32.5 g/dL (ref 30.0–36.0)
MCV: 93.8 fL (ref 80.0–100.0)
Platelets: 199 10*3/uL (ref 150–400)
RBC: 4 MIL/uL (ref 3.87–5.11)
RDW: 14.7 % (ref 11.5–15.5)
WBC: 9.8 10*3/uL (ref 4.0–10.5)
nRBC: 0 % (ref 0.0–0.2)

## 2023-03-17 LAB — SARS CORONAVIRUS 2 BY RT PCR: SARS Coronavirus 2 by RT PCR: NEGATIVE

## 2023-03-17 LAB — CULTURE, BLOOD (ROUTINE X 2)

## 2023-03-17 MED ORDER — VITAMIN B-12 1000 MCG PO TABS: 1000.0000 ug | ORAL_TABLET | Freq: Every day | ORAL | Status: DC

## 2023-03-17 MED ORDER — PANTOPRAZOLE SODIUM 40 MG PO TBEC: 40.0000 mg | DELAYED_RELEASE_TABLET | Freq: Every day | ORAL | Status: DC

## 2023-03-17 NOTE — Progress Notes (Signed)
Nursing Overnight report:  Pt slept throughout the night. She was arousable and answered questions appropriately in a calm manner. She did not require PRN medications for agitation; She awoke to take medications, vital signs, and went to sleep again without difficulty.

## 2023-03-17 NOTE — Discharge Instructions (Signed)
IMPORTANT INFORMATION: PAY CLOSE ATTENTION   PHYSICIAN DISCHARGE INSTRUCTIONS  Follow with Primary care provider  Vickie Holster, NP  and other consultants as instructed by your Hospitalist Physician  SEEK MEDICAL CARE OR RETURN TO EMERGENCY ROOM IF SYMPTOMS COME BACK, WORSEN OR NEW PROBLEM DEVELOPS   Please note: You were cared for by a hospitalist during your hospital stay. Every effort will be made to forward records to your primary care provider.  You can request that your primary care provider send for your hospital records if they have not received them.  Once you are discharged, your primary care physician will handle any further medical issues. Please note that NO REFILLS for any discharge medications will be authorized once you are discharged, as it is imperative that you return to your primary care physician (or establish a relationship with a primary care physician if you do not have one) for your post hospital discharge needs so that they can reassess your need for medications and monitor your lab values.  Please get a complete blood count and chemistry panel checked by your Primary MD at your next visit, and again as instructed by your Primary MD.  Get Medicines reviewed and adjusted: Please take all your medications with you for your next visit with your Primary MD  Laboratory/radiological data: Please request your Primary MD to go over all hospital tests and procedure/radiological results at the follow up, please ask your primary care provider to get all Hospital records sent to his/her office.  In some cases, they will be blood work, cultures and biopsy results pending at the time of your discharge. Please request that your primary care provider follow up on these results.  If you are diabetic, please bring your blood sugar readings with you to your follow up appointment with primary care.    Please call and make your follow up appointments as soon as possible.    Also Note  the following: If you experience worsening of your admission symptoms, develop shortness of breath, life threatening emergency, suicidal or homicidal thoughts you must seek medical attention immediately by calling 911 or calling your MD immediately  if symptoms less severe.  You must read complete instructions/literature along with all the possible adverse reactions/side effects for all the Medicines you take and that have been prescribed to you. Take any new Medicines after you have completely understood and accpet all the possible adverse reactions/side effects.   Do not drive when taking Pain medications or sleeping medications (Benzodiazepines)  Do not take more than prescribed Pain, Sleep and Anxiety Medications. It is not advisable to combine anxiety,sleep and pain medications without talking with your primary care practitioner  Special Instructions: If you have smoked or chewed Tobacco  in the last 2 yrs please stop smoking, stop any regular Alcohol  and or any Recreational drug use.  Wear Seat belts while driving.  Do not drive if taking any narcotic, mind altering or controlled substances or recreational drugs or alcohol.

## 2023-03-17 NOTE — Discharge Summary (Signed)
Physician Discharge Summary  SUEANNA WHEELDON WUJ:[id number] DOB: Apr 03, 1931 DOA: 03/14/2023  PCP: Sharee Holster, NP  Admit date: 03/14/2023 Discharge date: 03/17/2023  Admitted From:  [facility]  Disposition:  [facility]   Recommendations for Outpatient Follow-up:  Follow up with PCP in 5-7 days Please obtain CBC in one week to follow up hemoglobin Please continue good bowel regimen to avoid constipation Please consider monthly B12 injections Please check B12 level in 1 month   Home Health:  SNF   Discharge Condition: STABLE   CODE STATUS: DNR DIET: soft foods recommended x 5 more days then resume prior diet    Brief Hospitalization Summary: Please see all hospital notes, images, labs for full details of the hospitalization. ADMISSION PROVIDER HPI:  89 y.o. female, with medical history significant of essential hypertension, hypothyroidism, gastroesophageal reflux disease, depression, vitamin D deficiency, history of HOCM and chronic low back pain, history of CVA with left-sided weakness in 2022, since then she is SNF resident. -Is very poor historian due to dementia, history was obtained with assistance with daughter, and ED staff, patient presents to ED secondary to bright red blood per rectum, patient with severe constipation, requiring manual disimpaction at facility couple days ago, as well daughter reports patient has been more lethargic over the last 4 days, she was started on MiraLAX at her facility, she was noted to have a drop in her hemoglobin, her baseline 13.5 last month, it is 8.5 on admissio. -In ED labs confirm anemia with hemoglobin of 8.5, she was noted to have external hemorrhoid with bright red blood per rectum by ED physician, her CT abdomen pelvis noted for diverticulosis, [facility] hospitalist consulted to admit.  Hospital Course by problem list   Acute blood loss anemia due to lower GI bleed/bright red blood per rectum -Baseline hemoglobin 13.5, it is 8.5 this  admission, she presents with bright red blood per rectum, most likely diverticular bleed. -CBC being followed and Hg has improved and no further bleeding events -s/p 2 units PRBC and Hg up to 12 from 7.2 on 03/14/23.  -GI consulted, recommended conservative mgmt now due to advanced age and comorbidities, no procedures -recheck CBC in AM  -fortunately Hg stable last 24 hours -Hg remains stable 5/26 plan discharge back to skilled nursing care home -Hg improved to 12.2 on 5/26 which is reassuring.       Acute encephalopathy, unspecified - RESOLVED  -She was more somnolent and confused reportedly than baseline on presentation, she is with known dementia -She is with leukocytosis, ruled out infectious process with UA, -Mentation waxing and waning due to acute hospital delirium, tiny dose of seroquel given last night to treat sundowning with good results, pt is stable to discharge back to [facility] today.  -delirium precautions used in hospital with good results     Constipation  - bowel regimen strongly encouraged - continue miralax daily   Leukocytosis - reactive to GI hemorrhage - RESOLVED  - no s/s of infection found  - WBC normalized now     History of CVA with residual left-sided weakness-continue with supportive care   History of dementia -Resumed her meds    Hypothyroidism -continue with levothyroxine   GERD -Continue with pantoprazole   AKI on CKD -Continue with gentle hydration and avoid nephrotoxic medications     B12 deficiency -B12 level is low 207 once measured last month, treated with IM supplements in hospital  -Pt will need monthly B12 injections to prevent further severe  deficiencies -we have recommended daily oral B12 tablet 1000 mcg    DNR present on admission -continue DNR order in hospital    Adult Failure to thrive -pt is residing in SNF and will return to [facility]      Discharge Diagnoses:  Principal Problem:   Bright red blood per  rectum Active Problems:   Hypothyroidism   Vascular dementia without behavioral disturbance (HCC)   GERD without esophagitis   Chronic constipation   Acute on chronic renal failure (HCC)   Acute anemia   GI bleed   Discharge Instructions:  Allergies as of 03/17/2023       Reactions   Hydrochlorothiazide    05/29/2021 K+ 2.6 Other reaction(s): Other (See Comments) 05/29/2021 K+ 2.6   Codeine Nausea Only        Medication List     TAKE these medications    acetaminophen 325 MG tablet Commonly known as: TYLENOL Take 650 mg by mouth every 6 (six) hours.   cyanocobalamin 1000 MCG tablet Commonly known as: VITAMIN B12 Take 1 tablet (1,000 mcg total) by mouth daily.   levothyroxine 50 MCG tablet Commonly known as: SYNTHROID Take by mouth daily before breakfast.   memantine 28 MG Cp24 24 hr capsule Commonly known as: NAMENDA XR Take 28 mg by mouth daily.   pantoprazole 40 MG tablet Commonly known as: PROTONIX Take 1 tablet (40 mg total) by mouth at bedtime.   polyethylene glycol 17 g packet Commonly known as: MIRALAX / GLYCOLAX Take 17 g by mouth daily.   PREPARATION H RE Place 1 suppository rectally in the morning and at bedtime.   sertraline 50 MG tablet Commonly known as: ZOLOFT Take 50 mg by mouth daily.   sodium chloride 0.65 % Soln nasal spray Commonly known as: OCEAN Place 1 spray into both nostrils 3 (three) times daily. And as needed   Vitamin D-3 25 MCG (1000 UT) Caps Take 1 capsule by mouth daily.        Follow-up Information     Sharee Holster, NP. Schedule an appointment as soon as possible for a visit.   Specialty: Geriatric Medicine Why: Hospital Follow Up Contact information: [address] [place] [address] [phone]                Allergies  Allergen Reactions   Hydrochlorothiazide     05/29/2021 K+ 2.6 Other reaction(s): Other (See Comments) 05/29/2021 K+ 2.6   Codeine Nausea Only   Allergies as of  03/17/2023       Reactions   Hydrochlorothiazide    05/29/2021 K+ 2.6 Other reaction(s): Other (See Comments) 05/29/2021 K+ 2.6   Codeine Nausea Only        Medication List     TAKE these medications    acetaminophen 325 MG tablet Commonly known as: TYLENOL Take 650 mg by mouth every 6 (six) hours.   cyanocobalamin 1000 MCG tablet Commonly known as: VITAMIN B12 Take 1 tablet (1,000 mcg total) by mouth daily.   levothyroxine 50 MCG tablet Commonly known as: SYNTHROID Take by mouth daily before breakfast.   memantine 28 MG Cp24 24 hr capsule Commonly known as: NAMENDA XR Take 28 mg by mouth daily.   pantoprazole 40 MG tablet Commonly known as: PROTONIX Take 1 tablet (40 mg total) by mouth at bedtime.   polyethylene glycol 17 g packet Commonly known as: MIRALAX / GLYCOLAX Take 17 g by mouth daily.   PREPARATION H RE Place 1 suppository  rectally in the morning and at bedtime.   sertraline 50 MG tablet Commonly known as: ZOLOFT Take 50 mg by mouth daily.   sodium chloride 0.65 % Soln nasal spray Commonly known as: OCEAN Place 1 spray into both nostrils 3 (three) times daily. And as needed   Vitamin D-3 25 MCG (1000 UT) Caps Take 1 capsule by mouth daily.        Procedures/Studies: CT ABDOMEN PELVIS WO CONTRAST  Result Date: 03/14/2023 CLINICAL DATA:  Rectal bleeding and lethargy EXAM: CT ABDOMEN AND PELVIS WITHOUT CONTRAST TECHNIQUE: Multidetector CT imaging of the abdomen and pelvis was performed following the standard protocol without IV contrast. RADIATION DOSE REDUCTION: This exam was performed according to the departmental dose-optimization program which includes automated exposure control, adjustment of the mA and/or kV according to patient size and/or use of iterative reconstruction technique. COMPARISON:  None Available. FINDINGS: Lower chest: Diffuse peripheral reticulations and bilateral lower lobe traction bronchiectasis with scattered subsegmental  mucous plugging. Irregular left lower lobe nodule measures 6 x 5 mm (4:16). No pleural effusion or pneumothorax demonstrated. Partially imaged heart size is normal. Coronary artery calcifications. Hepatobiliary: No focal hepatic lesions. No intra or extrahepatic biliary ductal dilation. Normal gallbladder. Pancreas: No focal lesions or main ductal dilation. Spleen: Normal in size without focal abnormality. Adrenals/Urinary Tract: No adrenal nodules. No suspicious renal mass, calculi or hydronephrosis. No focal bladder wall thickening. Stomach/Bowel: Normal appearance of the stomach. Large duodenal diverticulum. No evidence of bowel wall thickening or inflammatory changes. Colonic diverticulosis without acute diverticulitis. Rectum is mildly distended with stool. Appendix is not discretely seen. Vascular/Lymphatic: Aortic atherosclerosis. No enlarged abdominal or pelvic lymph nodes. Reproductive: No adnexal masses. Other: Mild presacral edema.  No free air. Musculoskeletal: No acute or abnormal lytic or blastic osseous lesions. Multilevel degenerative changes of the partially imaged thoracic and lumbar spine. IMPRESSION: 1. No acute abnormality in the abdomen or pelvis. 2. Colonic diverticulosis without acute diverticulitis. Mildly distended rectum containing stool. 3. Diffuse peripheral reticulations and bilateral lower lobe traction bronchiectasis with scattered subsegmental mucous plugging, suggestive of chronic interstitial lung disease. 4. Irregular left lower lobe lung nodule measures 6 mm. Non-contrast chest CT at 6-12 months is recommended. If the nodule is stable at time of repeat CT, then future CT at 18-24 months (from today's scan) is considered optional for low-risk patients, but is recommended for high-risk patients. This recommendation follows the consensus statement: Guidelines for Management of Incidental Pulmonary Nodules Detected on CT Images: From the Fleischner Society 2017; Radiology 2017;  284:228-243. 5. Aortic Atherosclerosis (ICD10-I70.0). Coronary artery calcifications. Assessment for potential risk factor modification, dietary therapy or pharmacologic therapy may be warranted, if clinically indicated. Electronically Signed   By: Agustin Cree M.D.   On: 03/14/2023 14:50     Subjective: Pt reports that she is tolerating diet well.  No bleeding events.  No complaints.  Agreeable to return to [facility].   Discharge Exam: Vitals:   03/16/23 2022 03/17/23 0534  BP: (!) 157/74 (!) 165/82  Pulse: 82 82  Resp: 16 (!) 24  Temp: 97.9 F (36.6 C) 98 F (36.7 C)  SpO2: 97% 95%   Vitals:   03/16/23 0419 03/16/23 1354 03/16/23 2022 03/17/23 0534  BP: 133/69 (!) 158/54 (!) 157/74 (!) 165/82  Pulse: 62 85 82 82  Resp: 18 17 16  (!) 24  Temp: 98 F (36.7 C) 98.8 F (37.1 C) 97.9 F (36.6 C) 98 F (36.7 C)  TempSrc: Oral Oral Oral Oral  SpO2: 94% 98% 97% 95%  Weight:      Height:       General: Pt is alert, awake, not in acute distress Cardiovascular: normal S1/S2 +, no rubs, no gallops Respiratory: CTA bilaterally, no wheezing, no rhonchi Abdominal: Soft, NT, ND, bowel sounds + Extremities: no edema, no cyanosis   The results of significant diagnostics from this hospitalization (including imaging, microbiology, ancillary and laboratory) are listed below for reference.     Microbiology: Recent Results (from the past 240 hour(s))  Culture, blood (Routine X 2) w Reflex to ID Panel     Status: None (Preliminary result)   Collection Time: 03/14/23  6:04 PM   Specimen: BLOOD  Result Value Ref Range Status   Specimen Description BLOOD BLOOD RIGHT HAND  Final   Special Requests   Final    BOTTLES DRAWN AEROBIC ONLY Blood Culture adequate volume   Culture   Final    NO GROWTH 2 DAYS Performed at [facility], [address] [place], [place] [address]    Report Status PENDING  Incomplete  Culture, blood (Routine X 2) w Reflex to ID Panel     Status: None (Preliminary  result)   Collection Time: 03/14/23  6:05 PM   Specimen: BLOOD  Result Value Ref Range Status   Specimen Description BLOOD BLOOD LEFT ARM  Final   Special Requests   Final    BOTTLES DRAWN AEROBIC AND ANAEROBIC Blood Culture adequate volume   Culture   Final    NO GROWTH 2 DAYS Performed at [facility], [address] [place], [place] [address]    Report Status PENDING  Incomplete     Labs: BNP (last 3 results) No results for input(s): "BNP" in the last 8760 hours. Basic Metabolic Panel: Recent Labs  Lab 03/14/23 0955 03/14/23 1310 03/15/23 0737 03/16/23 0458  NA 138 138 139 138  K 5.7* 4.9 3.9 3.6  CL 101 103 104 103  CO2 28 28 28 27   GLUCOSE 154* 140* 101* 99  BUN 34* 38* 25* 18  CREATININE 1.36* 1.35* 1.03* 0.89  CALCIUM 9.1 8.9 8.4* 8.2*  MG  --   --   --  2.3   Liver Function Tests: Recent Labs  Lab 03/14/23 0955 03/14/23 1310  AST 15 16  ALT 12 13  ALKPHOS 34* 35*  BILITOT 0.9 0.6  PROT 6.0* 5.8*  ALBUMIN 3.4* 3.2*   No results for input(s): "LIPASE", "AMYLASE" in the last 168 hours. No results for input(s): "AMMONIA" in the last 168 hours. CBC: Recent Labs  Lab 03/14/23 0955 03/14/23 1310 03/14/23 1804 03/14/23 2211 03/15/23 0737 03/15/23 1225 03/16/23 0458 03/17/23 0449  WBC 16.7* 16.1* 11.7*  --  10.9*  --  8.5 9.8  NEUTROABS 11.6* 9.7*  --   --   --   --   --   --   HGB 9.5* 8.5* 8.0* 7.2* 12.2 11.8* 11.4* 12.2  HCT 28.1* 26.3* 25.3* 22.3* 36.9 35.2* 34.5* 37.5  MCV 96.6 96.0 99.2  --  92.7  --  93.5 93.8  PLT 214 205 164  --  149*  --  151 199   Cardiac Enzymes: No results for input(s): "CKTOTAL", "CKMB", "CKMBINDEX", "TROPONINI" in the last 168 hours. BNP: Invalid input(s): "POCBNP" CBG: No results for input(s): "GLUCAP" in the last 168 hours. D-Dimer No results for input(s): "DDIMER" in the last 72 hours. Hgb A1c No results for input(s): "HGBA1C" in the last 72 hours. Lipid Profile  No results for input(s): "CHOL", "HDL",  "LDLCALC", "TRIG", "CHOLHDL", "LDLDIRECT" in the last 72 hours. Thyroid function studies No results for input(s): "TSH", "T4TOTAL", "T3FREE", "THYROIDAB" in the last 72 hours.  Invalid input(s): "FREET3" Anemia work up No results for input(s): "VITAMINB12", "FOLATE", "FERRITIN", "TIBC", "IRON", "RETICCTPCT" in the last 72 hours. Urinalysis    Component Value Date/Time   COLORURINE YELLOW 03/14/2023 1710   APPEARANCEUR CLEAR 03/14/2023 1710   LABSPEC 1.023 03/14/2023 1710   PHURINE 5.0 03/14/2023 1710   GLUCOSEU NEGATIVE 03/14/2023 1710   HGBUR NEGATIVE 03/14/2023 1710   BILIRUBINUR NEGATIVE 03/14/2023 1710   KETONESUR NEGATIVE 03/14/2023 1710   PROTEINUR NEGATIVE 03/14/2023 1710   NITRITE NEGATIVE 03/14/2023 1710   LEUKOCYTESUR NEGATIVE 03/14/2023 1710   Sepsis Labs Recent Labs  Lab 03/14/23 1804 03/15/23 0737 03/16/23 0458 03/17/23 0449  WBC 11.7* 10.9* 8.5 9.8   Microbiology Recent Results (from the past 240 hour(s))  Culture, blood (Routine X 2) w Reflex to ID Panel     Status: None (Preliminary result)   Collection Time: 03/14/23  6:04 PM   Specimen: BLOOD  Result Value Ref Range Status   Specimen Description BLOOD BLOOD RIGHT HAND  Final   Special Requests   Final    BOTTLES DRAWN AEROBIC ONLY Blood Culture adequate volume   Culture   Final    NO GROWTH 2 DAYS Performed at [facility], [address], [place], [place] [address]    Report Status PENDING  Incomplete  Culture, blood (Routine X 2) w Reflex to ID Panel     Status: None (Preliminary result)   Collection Time: 03/14/23  6:05 PM   Specimen: BLOOD  Result Value Ref Range Status   Specimen Description BLOOD BLOOD LEFT ARM  Final   Special Requests   Final    BOTTLES DRAWN AEROBIC AND ANAEROBIC Blood Culture adequate volume   Culture   Final    NO GROWTH 2 DAYS Performed at [facility], [address], [place], [place] [address]    Report Status PENDING  Incomplete   Time coordinating discharge: 37  mins   SIGNED:  Standley Dakins, MD  [facility] 03/17/2023, 9:56 AM How to contact the [facility] Attending or Consulting provider 7A - 7P or covering provider during after hours 7P -7A, for this patient?  Check the care team in [facility] and look for a) attending/consulting [facility] provider listed and b) the [facility] team listed Log into [email or web address] and use [place]'s universal password to access. If you do not have the password, please contact the hospital operator. Locate the [facility] provider you are looking for under [facility] and page to a number that you can be directly reached. If you still have difficulty reaching the provider, please page the [facility] (Director on Call) for the Hospitalists listed on amion for assistance.

## 2023-03-17 NOTE — Progress Notes (Signed)
Nsg Discharge Note  Admit Date:  03/14/2023 Discharge date: 03/17/2023   Vickie Mckee to be D/C'd Skilled nursing facility per MD order.  AVS completed.   Patient/caregiver able to verbalize understanding.  Discharge Medication: Allergies as of 03/17/2023       Reactions   Hydrochlorothiazide    05/29/2021 K+ 2.6 Other reaction(s): Other (See Comments) 05/29/2021 K+ 2.6   Codeine Nausea Only        Medication List     TAKE these medications    acetaminophen 325 MG tablet Commonly known as: TYLENOL Take 650 mg by mouth every 6 (six) hours.   cyanocobalamin 1000 MCG tablet Commonly known as: VITAMIN B12 Take 1 tablet (1,000 mcg total) by mouth daily.   levothyroxine 50 MCG tablet Commonly known as: SYNTHROID Take by mouth daily before breakfast.   memantine 28 MG Cp24 24 hr capsule Commonly known as: NAMENDA XR Take 28 mg by mouth daily.   pantoprazole 40 MG tablet Commonly known as: PROTONIX Take 1 tablet (40 mg total) by mouth at bedtime.   polyethylene glycol 17 g packet Commonly known as: MIRALAX / GLYCOLAX Take 17 g by mouth daily.   PREPARATION H RE Place 1 suppository rectally in the morning and at bedtime.   sertraline 50 MG tablet Commonly known as: ZOLOFT Take 50 mg by mouth daily.   sodium chloride 0.65 % Soln nasal spray Commonly known as: OCEAN Place 1 spray into both nostrils 3 (three) times daily. And as needed   Vitamin D-3 25 MCG (1000 UT) Caps Take 1 capsule by mouth daily.        Discharge Assessment: Vitals:   03/16/23 2022 03/17/23 0534  BP: (!) 157/74 (!) 165/82  Pulse: 82 82  Resp: 16 (!) 24  Temp: 97.9 F (36.6 C) 98 F (36.7 C)  SpO2: 97% 95%   Skin clean, dry and intact without evidence of skin break down, no evidence of skin tears noted. IV catheter discontinued intact. Site without signs and symptoms of complications - no redness or edema noted at insertion site, patient denies c/o pain - only slight tenderness at  site.  Dressing with slight pressure applied.  D/c Instructions-Education: Discharge instructions given to patient/family with verbalized understanding. D/c education completed with patient/family including follow up instructions, medication list, d/c activities limitations if indicated, with other d/c instructions as indicated by MD - patient able to verbalize understanding, all questions fully answered. Patient instructed to return to ED, call 911, or call MD for any changes in condition.  Patient escorted via WC, and D/C home via private auto.  Laurena Spies, RN 03/17/2023 10:03 AM

## 2023-03-19 ENCOUNTER — Non-Acute Institutional Stay (SKILLED_NURSING_FACILITY): Payer: Medicare Other | Admitting: Adult Health

## 2023-03-19 ENCOUNTER — Encounter: Payer: Self-pay | Admitting: Adult Health

## 2023-03-19 DIAGNOSIS — I421 Obstructive hypertrophic cardiomyopathy: Secondary | ICD-10-CM

## 2023-03-19 DIAGNOSIS — F0390 Unspecified dementia without behavioral disturbance: Secondary | ICD-10-CM

## 2023-03-19 DIAGNOSIS — D649 Anemia, unspecified: Secondary | ICD-10-CM | POA: Diagnosis not present

## 2023-03-19 DIAGNOSIS — K219 Gastro-esophageal reflux disease without esophagitis: Secondary | ICD-10-CM

## 2023-03-19 DIAGNOSIS — E782 Mixed hyperlipidemia: Secondary | ICD-10-CM

## 2023-03-19 DIAGNOSIS — E46 Unspecified protein-calorie malnutrition: Secondary | ICD-10-CM

## 2023-03-19 DIAGNOSIS — E538 Deficiency of other specified B group vitamins: Secondary | ICD-10-CM

## 2023-03-19 DIAGNOSIS — K5791 Diverticulosis of intestine, part unspecified, without perforation or abscess with bleeding: Secondary | ICD-10-CM | POA: Diagnosis not present

## 2023-03-19 DIAGNOSIS — F339 Major depressive disorder, recurrent, unspecified: Secondary | ICD-10-CM

## 2023-03-19 DIAGNOSIS — G319 Degenerative disease of nervous system, unspecified: Secondary | ICD-10-CM

## 2023-03-19 DIAGNOSIS — I700 Atherosclerosis of aorta: Secondary | ICD-10-CM | POA: Diagnosis not present

## 2023-03-19 DIAGNOSIS — E8809 Other disorders of plasma-protein metabolism, not elsewhere classified: Secondary | ICD-10-CM

## 2023-03-19 DIAGNOSIS — E559 Vitamin D deficiency, unspecified: Secondary | ICD-10-CM

## 2023-03-19 DIAGNOSIS — R29818 Other symptoms and signs involving the nervous system: Secondary | ICD-10-CM

## 2023-03-19 DIAGNOSIS — Z8673 Personal history of transient ischemic attack (TIA), and cerebral infarction without residual deficits: Secondary | ICD-10-CM

## 2023-03-19 DIAGNOSIS — J849 Interstitial pulmonary disease, unspecified: Secondary | ICD-10-CM

## 2023-03-19 DIAGNOSIS — F0150 Vascular dementia without behavioral disturbance: Secondary | ICD-10-CM

## 2023-03-19 DIAGNOSIS — G8194 Hemiplegia, unspecified affecting left nondominant side: Secondary | ICD-10-CM

## 2023-03-19 DIAGNOSIS — E890 Postprocedural hypothyroidism: Secondary | ICD-10-CM

## 2023-03-19 DIAGNOSIS — R4189 Other symptoms and signs involving cognitive functions and awareness: Secondary | ICD-10-CM

## 2023-03-19 DIAGNOSIS — N1831 Chronic kidney disease, stage 3a: Secondary | ICD-10-CM

## 2023-03-19 DIAGNOSIS — I10 Essential (primary) hypertension: Secondary | ICD-10-CM

## 2023-03-19 DIAGNOSIS — R400 Somnolence: Secondary | ICD-10-CM

## 2023-03-19 LAB — CULTURE, BLOOD (ROUTINE X 2)
Culture: NO GROWTH
Special Requests: ADEQUATE

## 2023-03-19 NOTE — Progress Notes (Signed)
Location:  [facility] Room Number: 129 P Place of Service:  SNF (31)   CODE STATUS: DNR  Allergies  Allergen Reactions   Hydrochlorothiazide     05/29/2021 K+ 2.6 Other reaction(s): Other (See Comments) 05/29/2021 K+ 2.6   Codeine Nausea Only    Chief Complaint  Patient presents with   Hospitalization Follow-up    Follow-up from hospital stay 03/14/23-03/17/23 for bright red blood in rectum     HPI:  She is a 89 year old long term resident of this facility who has been hospitalized from 03-14-23 through 03-17-23. Her medical history includes: hypertension; hypothyroidism; HOCM; history of CVA. She had severe constipation which required manual removal of stool a couple of days prior to her hospitalization. She was noted to be more lethargic. She had been started on miralax for her constipation on blood found her to have a drop in her hgb from 13.5 to 8.5. with bright red bleeding from her rectum.  She was noted to have bleeding from external hemorrhoids. Her ct scan demonstrated diverticulosis.  Acute blood loss anemia due to lower GI bleeding/bright red blood per rectum: a drop in hgb from 13.5 to 8.5. she did require 2 units packed cells for her hgb of 7.2 on 03-14-23. GI consult was obtained and recommended conservative treatment due to advanced age and co morbidities. There are no procedures performed.  Her acute metabolic encephalopathy has resolved back to her baseline.  For her constipation: will need to continue miralax.  She will continue to be followed for her chronic illnesses including: Essential hypertension: Interstitial lung disease: HOCM(hypertrophic obstructive cardiomyopathy) Vascular dementia without behavioral disturbance/neurocognitive deficits/cerebral atrophy:        Past Medical History:  Diagnosis Date   Decreased sense of smell    and taste-negative CT scan-2011   Depression    Essential hypertension    GERD (gastroesophageal reflux  disease)    Heart murmur    heard first time 4/12   History of mammogram    2010 and she does not want to repeat them anymore   HOCM (hypertrophic obstructive cardiomyopathy) (HCC)    a. Dx by echo 05/2014.   Hypothyroidism    IBS (irritable bowel syndrome)    Impaired fasting glucose    Low back pain    Lumbar radiculopathy    with negative MRI (1991)   Postmenopausal    with osteopenia, bisphosphonates 1/05-1/11, improved osteopenia 4/12-repeat planned late 2015   Shingles    2015   Thyroid nodule    Vitamin D deficiency    repleted on weekly vitamin D 16109 iu    Past Surgical History:  Procedure Laterality Date   ABDOMINAL HYSTERECTOMY     BREAST BIOPSY     x2   CATARACT EXTRACTION     removal with IOL bilateral   GLAUCOMA SURGERY     laser   skin cancer removal     THYROIDECTOMY     subtotal    Social History   Socioeconomic History   Marital status: Married    Spouse name: Not on file   Number of children: Not on file   Years of education: Not on file   Highest education level: Not on file  Occupational History   Not on file  Tobacco Use   Smoking status: Never   Smokeless tobacco: Never  Vaping Use   Vaping Use: Never used  Substance and Sexual Activity   Alcohol use: No  Alcohol/week: 0.0 standard drinks of alcohol   Drug use: Never   Sexual activity: Not on file  Other Topics Concern   Not on file  Social History Narrative   Not on file   Social Determinants of Health   Financial Resource Strain: Not on file  Food Insecurity: No Food Insecurity (03/14/2023)   Hunger Vital Sign    Worried About Running Out of Food in the Last Year: Never true    Ran Out of Food in the Last Year: Never true  Transportation Needs: No Transportation Needs (03/14/2023)   PRAPARE - Administrator, Civil Service (Medical): No    Lack of Transportation (Non-Medical): No  Physical Activity: Not on file  Stress: Not on file  Social Connections: Not on  file  Intimate Partner Violence: Not At Risk (03/14/2023)   Humiliation, Afraid, Rape, and Kick questionnaire    Fear of Current or Ex-Partner: No    Emotionally Abused: No    Physically Abused: No    Sexually Abused: No   Family History  Problem Relation Age of Onset   Breast cancer Mother    Hypertension Father    CVA Father    Heart failure Sister    CAD Sister    Kidney disease Sister    COPD Brother    Lung cancer Brother    CAD Brother    Diabetes Brother    Stroke Father    Heart attack Neg Hx       VITAL SIGNS BP 134/66   Pulse 82   Temp 98 F (36.7 C)   Ht 5\' 2"  (1.575 m)   Wt 130 lb 9.6 oz (59.2 kg)   SpO2 97%   BMI 23.89 kg/m   Outpatient Encounter Medications as of 03/19/2023  Medication Sig   acetaminophen (TYLENOL) 325 MG tablet Take 650 mg by mouth every 6 (six) hours.   amantadine (SYMMETREL) 50 MG/5ML solution Take 50 mg by mouth 2 (two) times daily.   ascorbic acid (VITAMIN C) 500 MG tablet Take 500 mg by mouth daily.   Cholecalciferol (VITAMIN D-3) 25 MCG (1000 UT) CAPS Take 1 capsule by mouth daily.   cyanocobalamin (VITAMIN B12) 1000 MCG tablet Take 2,000 mcg by mouth daily.   donepezil (ARICEPT) 5 MG tablet Take 5 mg by mouth at bedtime.   feeding supplement (ENSURE ENLIVE / ENSURE PLUS) LIQD Take 237 mLs by mouth 2 (two) times daily between meals.   levothyroxine (SYNTHROID) 50 MCG tablet Take by mouth daily before breakfast.   memantine (NAMENDA XR) 28 MG CP24 24 hr capsule Take 28 mg by mouth daily.   Menthol, Topical Analgesic, (BIOFREEZE) 4 % GEL Apply 1 application  topically 3 (three) times daily as needed.   oxymetazoline (AFRIN) 0.05 % nasal spray Place 1 spray into both nostrils daily as needed for congestion.   pantoprazole (PROTONIX) 40 MG tablet Take 1 tablet (40 mg total) by mouth at bedtime.   Phenylephrine HCl (PREPARATION H RE) Place 1 suppository rectally in the morning and at bedtime.   polyethylene glycol (MIRALAX / GLYCOLAX)  17 g packet Take 17 g by mouth daily.   sertraline (ZOLOFT) 50 MG tablet Take 50 mg by mouth daily.   sodium chloride (OCEAN) 0.65 % SOLN nasal spray Place 1 spray into both nostrils 3 (three) times daily. And as needed   [DISCONTINUED] cyanocobalamin (VITAMIN B12) 1000 MCG tablet Take 1 tablet (1,000 mcg total) by mouth daily.   No  facility-administered encounter medications on file as of 03/19/2023.     SIGNIFICANT DIAGNOSTIC EXAMS  PREVIOUS   08-09-22: chest x-ray: Mild prominent interstitial densities bilateral compatible with pneumonitis. Mild cardiomegaly; mild osteopenia; mild osteoarthritis.   TODAY   03-14-23: ct of abdomen and pelvis:  1. No acute abnormality in the abdomen or pelvis. 2. Colonic diverticulosis without acute diverticulitis. Mildly distended rectum containing stool. 3. Diffuse peripheral reticulations and bilateral lower lobe traction bronchiectasis with scattered subsegmental mucous plugging, suggestive of chronic interstitial lung disease. 4. Irregular left lower lobe lung nodule measures 6 mm. Non-contrast chest CT at 6-12 months is recommended. If the nodule is stable at time of repeat CT, then future CT at 18-24 months (from today's scan) is considered optional for low-risk patients, but is recommended for high-risk patients.  5. Aortic Atherosclerosis. Coronary artery calcifications.   LABS REVIEWED PREVIOUS     08-09-22: wbc 9.7; hgb 14.3; hct 43.2; mcv 89.6 plt 188; glucose 133; bun 26; creat 0.98; k+ 3.5; na++ 138; ca 8.7; gfr 55; ddimer: 0.95 crp 3.8 08-13-22: TSH 0.179; vitamin D 35.44  09-13-22: tsh 2.261 free t4: 0.95 11-20-22: urine culture: klebsiella pneumoniae   TODAY  01-31-23: wbc 8.6; hgb 13.4; hct 40.9; mcv 92.7 plt 208; glucose 95; bun 20; creat 0.87; k+ 3.3; na++ 138; ca 8.7; gfr >60; protein 6.1; albumin 3.4 hgb A1c 5.6; tsh 2.505; vitamin D 37.78; vitamin B 12: 207 vitamin B 6: 4.4 03-14-23: wbc 16.7; hgb 9.5; hct 28.1; mcv 96.6 plt 214;  glucose 154; bun 34; creat 1.36; k+ 5.7; na++ 138; ca 9.1 gfr 37; protein 6.0 albumin 3.4 03-14-23 (#2) wbc 16.1; hgb 8.5; hct 26.3; mcv 96.0 plt  205; glucose 140; bun 38; creat 1.35; k+ 4.9; na++ 138; ca 8.9; gfr 38; protein 5.8; albumin 3.2 FOB +; blood culture: no growth 03-15-23: wbc 10.9; hgb 12.2; hct 36.9; mcv 92.7 plt 149; glucose 101; bun 25; creat 1.03 ;k+ 3.9; na++ 139; ca 8.4 gfr 52 03-17-23: wbc 8.5; hgb 11.4; hct 34.5; mcv 93.5 plt 151; glucose 99; bun 18;creat 0.89; k+ 3.6; na++ 138; ca 8.2 gfr >60; mag 2.3    Review of Systems  Constitutional:  Negative for malaise/fatigue.  Respiratory:  Negative for cough and shortness of breath.   Cardiovascular:  Negative for chest pain, palpitations and leg swelling.  Gastrointestinal:  Negative for abdominal pain, constipation and heartburn.  Musculoskeletal:  Negative for back pain, joint pain and myalgias.  Skin: Negative.   Neurological:  Negative for dizziness.  Psychiatric/Behavioral:  The patient is not nervous/anxious.    Physical Exam Constitutional:      General: She is not in acute distress.    Appearance: She is well-developed. She is not diaphoretic.  Neck:     Thyroid: No thyromegaly.  Cardiovascular:     Rate and Rhythm: Normal rate and regular rhythm.     Pulses: Normal pulses.     Heart sounds: Normal heart sounds.  Pulmonary:     Effort: Pulmonary effort is normal. No respiratory distress.     Breath sounds: Normal breath sounds.  Abdominal:     General: Bowel sounds are normal. There is no distension.     Palpations: Abdomen is soft.     Tenderness: There is no abdominal tenderness.  Musculoskeletal:     Cervical back: Neck supple.     Right lower leg: No edema.     Left lower leg: No edema.     Comments: Left hemiplegia  Lymphadenopathy:     Cervical: No cervical adenopathy.  Skin:    General: Skin is warm and dry.  Neurological:     Mental Status: She is alert. Mental status is at baseline.   Psychiatric:        Mood and Affect: Mood normal.       ASSESSMENT/ PLAN:  TODAY  Gastrointestinal hemorrhage associated with diverticulosis/acute anemia: did receive packed cells; hgb is presently stable the bleeding felt more than likely from diverticulosis. Will make her preparation H supp nightly   2. Essential hypertension: b/p 134/66 will monitor   3. Interstitial lung disease: is stable  4. HOCM(hypertrophic obstructive cardiomyopathy) will monitor   5. Vascular dementia without behavioral disturbance/neurocognitive deficits/cerebral atrophy: weight is 130 pounds; aricept 5 mg nightly and namenda xr 28 mg daily   6. Hypoalbuminemia due to protein calorie malnutrition: albumin 3.4 will continue supplements as directed  7.  Vitamin B 12 deficiency: level is 207; will continue 2,00 mcg daily   8. Vitamin D deficiency: level is 37.78 will continue 1,00 units daily  9. Postoperative hypothyroidism: tsh 2.505 will continue synthroid 50 mcg daily   10.  Mixed hyperlipidemia: is off statin due to advanced age  28. Psychosis in elderly without behavioral disturbance: is off seroquel  12. Depression, recurrent: will continue zoloft 50 mg daily   13. GERD without esophagitis: will continue protonix 40 mg daily   14. Chronic constipation: will continue miralax daily  15. Thoracic aortic atherosclerosis (cxr 12-15-14) not on statin due to advanced age  36. History of CVA/left hemiplegia: is off asa due to GI bleed  17. Somnolence post stroke: is on amantadine 50 mg twice daily  18. Chronic generalized pain: will continue tylenol 650 mg every 6 hours  19. Dysphagia: no indications of aspiration present: is on thin liquids  20. Stage 3a chronic kidney disease bun 18; creat 0.89 gfr >60   Will check cbc       Synthia Vickie Mckee [facility]  call [phone]

## 2023-03-21 ENCOUNTER — Encounter: Payer: Self-pay | Admitting: Internal Medicine

## 2023-03-21 ENCOUNTER — Non-Acute Institutional Stay (SKILLED_NURSING_FACILITY): Payer: Medicare Other | Admitting: Internal Medicine

## 2023-03-21 DIAGNOSIS — F0150 Vascular dementia without behavioral disturbance: Secondary | ICD-10-CM | POA: Diagnosis not present

## 2023-03-21 DIAGNOSIS — K625 Hemorrhage of anus and rectum: Secondary | ICD-10-CM | POA: Diagnosis not present

## 2023-03-21 DIAGNOSIS — E538 Deficiency of other specified B group vitamins: Secondary | ICD-10-CM | POA: Diagnosis not present

## 2023-03-21 DIAGNOSIS — N179 Acute kidney failure, unspecified: Secondary | ICD-10-CM

## 2023-03-21 DIAGNOSIS — N1832 Chronic kidney disease, stage 3b: Secondary | ICD-10-CM

## 2023-03-21 NOTE — Assessment & Plan Note (Signed)
She has absolutely no memory of the recent hospitalization and denies any GI symptoms or bleeding dyscrasias.  She made the statement that she had not been in the hospital "for a very long time, for pneumonia I think."

## 2023-03-21 NOTE — Assessment & Plan Note (Signed)
Parenteral B12 initiated while in inpatient and 1000 mcg of B12 orally continued.  Recheck B12 level in 8-10 weeks to verify adequate absorption.

## 2023-03-21 NOTE — Patient Instructions (Signed)
See assessment and plan under each diagnosis in the problem list and acutely for this visit 

## 2023-03-21 NOTE — Progress Notes (Signed)
NURSING HOME LOCATION:  [facility] ROOM NUMBER:  [id number]  CODE STATUS: DNR   PCP:  Synthia Innocent NP  This is a nursing facility follow up visit for Nursing Facility readmission within 30 days.  Interim medical record and care since last SNF visit was updated with review of diagnostic studies and change in clinical status since last visit were documented.  HPI: She was rehospitalized 5/23 - 03/17/2023 for bright red rectal bleeding in the context of severe constipation necessitating manual disimpaction at this SNF.  The patient cannot provide history due to dementia; her daughter reports that the patient had been more lethargic over the 4 days PTA. The rectal bleeding was associated with a drop in hemoglobin from 13.5 down to 8.5 at admission.  On exam external hemorrhoid was present with bright red rectal blood.  CT scan revealed diverticulosis. She was transfused with 2 units of packed red cells with rise in nadir H/H from 7.2 /22.3 up to 12.0/37.5.  B12 level was 207; IM supplementation was initiated as inpatient.  B12 1000 mcg orally was also continued. GI recommended conservative management due to advanced age and comorbidities. AKI on CKD responded to gentle rehydration.  Peak creatinine was 1.36 with a nadir GFR of 37 indicating stage IIIb CKD; final values were 0.89 with a GFR greater than 60 ,indicating stage II CKD. Course was complicated by acute encephalopathy. Although she continued to be somewhat somnolent and confused; no infectious process was identified. She did receive low-dose Seroquel the night prior to discharge due to sundowning. Bowel regimen with daily MiraLAX was continued.  Review of systems: Dementia invalidated responses.  She stated that she was doing "really good" but was "tired."  She validated having a "cold" but denies a cough or shortness of breath.  She has absolutely no memory of the hospitalization and in fact states she had not been in the  hospital for "a very long time, for pneumonia, I think."  She denies any GI symptoms or bleeding dyscrasias.  Constitutional: No fever, significant weight change, fatigue  Eyes: No redness, discharge, pain, vision change ENT/mouth: No nasal congestion,  purulent discharge, earache, change in hearing, sore throat  Cardiovascular: No chest pain, palpitations, paroxysmal nocturnal dyspnea, edema  Respiratory: No sputum production, hemoptysis, DOE   Gastrointestinal: No heartburn, dysphagia, abdominal pain, nausea /vomiting, rectal bleeding, melena, change in bowels Genitourinary: No dysuria, hematuria, pyuria, incontinence, nocturia Musculoskeletal: No joint stiffness, joint swelling, weakness, pain Dermatologic: No rash, pruritus, change in appearance of skin Neurologic: No dizziness, headache, syncope, seizures, numbness, tingling Psychiatric: No significant anxiety, depression, insomnia, anorexia Endocrine: No change in hair/skin/nails, excessive thirst, excessive hunger, excessive urination  Hematologic/lymphatic: No significant bruising, lymphadenopathy, abnormal bleeding Allergy/immunology: No itchy/watery eyes, significant sneezing, urticaria, angioedema  Physical exam:  Pertinent or positive findings: She appears her stated age; she was initially asleep with her head on her chest.  She exhibited hypopnea without frank apnea or snoring.  Heart rhythm is slow and very irregular.  She has sticky rales at the bases greater on the right than the left.  Pedal pulses are decreased except for the right dorsalis pedis pulse.  The left upper extremity is in a sling.  There is clawing of the left hand.  Interosseous wasting is noted.  She has bruising over the right forearm.  General appearance: no acute distress, increased work of breathing is present.   Lymphatic: No lymphadenopathy about the head, neck, axilla. Eyes: No conjunctival inflammation or  lid edema is present. There is no scleral  icterus. Ears:  External ear exam shows no significant lesions or deformities.   Nose:  External nasal examination shows no deformity or inflammation. Nasal mucosa are pink and moist without lesions, exudates Oral exam:  Lips and gums are healthy appearing. There is no oropharyngeal erythema or exudate. Neck:  No thyromegaly, masses, tenderness noted.    Heart:  No gallop, murmur, click, rub .  Lungs:  without wheezes, rhonchi, rubs. Abdomen: Bowel sounds are normal. Abdomen is soft and nontender with no organomegaly, hernias, masses. GU: Deferred  Extremities:  No cyanosis, clubbing, edema  Neurologic exam :Balance, Rhomberg, finger to nose testing could not be completed due to clinical state Skin: Warm & dry w/o tenting. No significant lesions or rash.  See summary under each active problem in the Problem List with associated updated therapeutic plan

## 2023-03-21 NOTE — Assessment & Plan Note (Signed)
The AKI was most likely due to significant blood loss anemia and poor oral intake.

## 2023-03-21 NOTE — Assessment & Plan Note (Signed)
Bowel regimen will be continued at SNF and patient monitored for recurrent bleeding.

## 2023-03-25 ENCOUNTER — Other Ambulatory Visit (HOSPITAL_COMMUNITY)
Admission: RE | Admit: 2023-03-25 | Discharge: 2023-03-25 | Disposition: A | Discharge status: Home / Self Care | Payer: Medicare Other | Source: Skilled Nursing Facility | Attending: Adult Health | Admitting: Adult Health

## 2023-03-25 DIAGNOSIS — N1831 Chronic kidney disease, stage 3a: Secondary | ICD-10-CM | POA: Diagnosis present

## 2023-03-25 LAB — CBC
HCT: 37.1 % (ref 36.0–46.0)
Hemoglobin: 11.8 g/dL — ABNORMAL LOW (ref 12.0–15.0)
MCH: 30.5 pg (ref 26.0–34.0)
MCHC: 31.8 g/dL (ref 30.0–36.0)
MCV: 95.9 fL (ref 80.0–100.0)
Platelets: 245 10*3/uL (ref 150–400)
RBC: 3.87 MIL/uL (ref 3.87–5.11)
RDW: 14.2 % (ref 11.5–15.5)
WBC: 8.4 10*3/uL (ref 4.0–10.5)
nRBC: 0 % (ref 0.0–0.2)

## 2023-04-09 ENCOUNTER — Encounter: Payer: Self-pay | Admitting: Adult Health

## 2023-04-09 ENCOUNTER — Non-Acute Institutional Stay (SKILLED_NURSING_FACILITY): Payer: PRIVATE HEALTH INSURANCE | Admitting: Adult Health

## 2023-04-09 DIAGNOSIS — K5791 Diverticulosis of intestine, part unspecified, without perforation or abscess with bleeding: Secondary | ICD-10-CM | POA: Diagnosis not present

## 2023-04-09 DIAGNOSIS — K219 Gastro-esophageal reflux disease without esophagitis: Secondary | ICD-10-CM

## 2023-04-09 DIAGNOSIS — I421 Obstructive hypertrophic cardiomyopathy: Secondary | ICD-10-CM | POA: Diagnosis not present

## 2023-04-09 DIAGNOSIS — I10 Essential (primary) hypertension: Secondary | ICD-10-CM

## 2023-04-09 NOTE — Progress Notes (Unsigned)
Location:  [facility] Room Number: 138 Place of Service:  SNF (31)   CODE STATUS: DNR  Allergies  Allergen Reactions   Hydrochlorothiazide     05/29/2021 K+ 2.6 Other reaction(s): Other (See Comments) 05/29/2021 K+ 2.6   Codeine Nausea Only    Chief Complaint  Patient presents with   Medical Management of Chronic Issues    Routine follow up    HPI:    Past Medical History:  Diagnosis Date   Decreased sense of smell    and taste-negative CT scan-2011   Depression    Essential hypertension    GERD (gastroesophageal reflux disease)    Heart murmur    heard first time 4/12   History of mammogram    2010 and she does not want to repeat them anymore   HOCM (hypertrophic obstructive cardiomyopathy) (HCC)    a. Dx by echo 05/2014.   Hypothyroidism    IBS (irritable bowel syndrome)    Impaired fasting glucose    Low back pain    Lumbar radiculopathy    with negative MRI (1991)   Postmenopausal    with osteopenia, bisphosphonates 1/05-1/11, improved osteopenia 4/12-repeat planned late 2015   Shingles    2015   Thyroid nodule    Vitamin D deficiency    repleted on weekly vitamin D 16109 iu    Past Surgical History:  Procedure Laterality Date   ABDOMINAL HYSTERECTOMY     BREAST BIOPSY     x2   CATARACT EXTRACTION     removal with IOL bilateral   GLAUCOMA SURGERY     laser   skin cancer removal     THYROIDECTOMY     subtotal    Social History   Socioeconomic History   Marital status: Married    Spouse name: Not on file   Number of children: Not on file   Years of education: Not on file   Highest education level: Not on file  Occupational History   Not on file  Tobacco Use   Smoking status: Never   Smokeless tobacco: Never  Vaping Use   Vaping Use: Never used  Substance and Sexual Activity   Alcohol use: No    Alcohol/week: 0.0 standard drinks of alcohol   Drug use: Never   Sexual activity: Not on file  Other Topics Concern    Not on file  Social History Narrative   Not on file   Social Determinants of Health   Financial Resource Strain: Not on file  Food Insecurity: No Food Insecurity (03/14/2023)   Hunger Vital Sign    Worried About Running Out of Food in the Last Year: Never true    Ran Out of Food in the Last Year: Never true  Transportation Needs: No Transportation Needs (03/14/2023)   PRAPARE - Administrator, Civil Service (Medical): No    Lack of Transportation (Non-Medical): No  Physical Activity: Not on file  Stress: Not on file  Social Connections: Not on file  Intimate Partner Violence: Not At Risk (03/14/2023)   Humiliation, Afraid, Rape, and Kick questionnaire    Fear of Current or Ex-Partner: No    Emotionally Abused: No    Physically Abused: No    Sexually Abused: No   Family History  Problem Relation Age of Onset   Breast cancer Mother    Hypertension Father    CVA Father    Heart failure Sister    CAD Sister  Kidney disease Sister    COPD Brother    Lung cancer Brother    CAD Brother    Diabetes Brother    Stroke Father    Heart attack Neg Hx       VITAL SIGNS BP 139/76   Pulse 79   Temp 97.6 F (36.4 C)   Resp 20   Ht 5\' 2"  (1.575 m)   Wt 126 lb 3.2 oz (57.2 kg)   SpO2 96%   BMI 23.08 kg/m   Outpatient Encounter Medications as of 04/09/2023  Medication Sig   acetaminophen (TYLENOL) 325 MG tablet Take 650 mg by mouth every 6 (six) hours.   amantadine (SYMMETREL) 50 MG/5ML solution Take 50 mg by mouth 2 (two) times daily.   ascorbic acid (VITAMIN C) 500 MG tablet Take 500 mg by mouth daily.   Cholecalciferol (VITAMIN D-3) 25 MCG (1000 UT) CAPS Take 1 capsule by mouth daily.   cyanocobalamin (VITAMIN B12) 1000 MCG tablet Take 2,000 mcg by mouth daily.   donepezil (ARICEPT) 5 MG tablet Take 5 mg by mouth at bedtime.   feeding supplement (ENSURE ENLIVE / ENSURE PLUS) LIQD Take 237 mLs by mouth 2 (two) times daily between meals.   levothyroxine  (SYNTHROID) 50 MCG tablet Take by mouth daily before breakfast.   memantine (NAMENDA XR) 28 MG CP24 24 hr capsule Take 28 mg by mouth daily.   Menthol, Topical Analgesic, (BIOFREEZE) 4 % GEL Apply 1 application  topically 3 (three) times daily as needed.   omeprazole (PRILOSEC) 20 MG capsule Take 20 mg by mouth 2 (two) times daily.   oxymetazoline (AFRIN) 0.05 % nasal spray Place 1 spray into both nostrils daily as needed for congestion.   Phenylephrine HCl (PREPARATION H RE) Place 1 suppository rectally in the morning and at bedtime.   polyethylene glycol (MIRALAX / GLYCOLAX) 17 g packet Take 17 g by mouth daily.   sertraline (ZOLOFT) 50 MG tablet Take 50 mg by mouth daily.   sodium chloride (OCEAN) 0.65 % SOLN nasal spray Place 1 spray into both nostrils 3 (three) times daily. And as needed   [DISCONTINUED] pantoprazole (PROTONIX) 40 MG tablet Take 1 tablet (40 mg total) by mouth at bedtime.   No facility-administered encounter medications on file as of 04/09/2023.     SIGNIFICANT DIAGNOSTIC EXAMS       ASSESSMENT/ PLAN:     Synthia Innocent NP [facility]  Contact [phone] Monday through Friday 8am- 5pm  After hours call [phone]

## 2023-04-29 ENCOUNTER — Other Ambulatory Visit (HOSPITAL_COMMUNITY)
Admission: RE | Admit: 2023-04-29 | Discharge: 2023-04-29 | Disposition: A | Discharge status: Home / Self Care | Payer: Medicare Other | Source: Skilled Nursing Facility | Attending: Adult Health | Admitting: Adult Health

## 2023-04-29 DIAGNOSIS — E538 Deficiency of other specified B group vitamins: Secondary | ICD-10-CM | POA: Insufficient documentation

## 2023-04-29 LAB — VITAMIN B12: Vitamin B-12: 491 pg/mL (ref 180–914)

## 2023-05-13 ENCOUNTER — Other Ambulatory Visit (HOSPITAL_COMMUNITY)
Admission: RE | Admit: 2023-05-13 | Discharge: 2023-05-13 | Disposition: A | Discharge status: Home / Self Care | Payer: Medicare Other | Source: Skilled Nursing Facility | Attending: Adult Health | Admitting: Adult Health

## 2023-05-13 DIAGNOSIS — N390 Urinary tract infection, site not specified: Secondary | ICD-10-CM | POA: Diagnosis present

## 2023-05-14 LAB — URINE CULTURE

## 2023-05-15 ENCOUNTER — Encounter: Payer: Self-pay | Admitting: Adult Health

## 2023-05-15 ENCOUNTER — Non-Acute Institutional Stay (SKILLED_NURSING_FACILITY): Payer: Medicare Other | Admitting: Adult Health

## 2023-05-15 DIAGNOSIS — N390 Urinary tract infection, site not specified: Secondary | ICD-10-CM | POA: Diagnosis not present

## 2023-05-15 LAB — URINE CULTURE: Culture: >=100000 — AB

## 2023-05-15 NOTE — Progress Notes (Signed)
Location:  [facility] Room Number: 138P Place of Service:  SNF (31)   CODE STATUS: DNR  Allergies  Allergen Reactions   Hydrochlorothiazide     05/29/2021 K+ 2.6 Other reaction(s): Other (See Comments) 05/29/2021 K+ 2.6   Codeine Nausea Only    Chief Complaint  Patient presents with   Acute Visit    For uti    HPI:    Past Medical History:  Diagnosis Date   Decreased sense of smell    and taste-negative CT scan-2011   Depression    Essential hypertension    GERD (gastroesophageal reflux disease)    Heart murmur    heard first time 4/12   History of mammogram    2010 and she does not want to repeat them anymore   HOCM (hypertrophic obstructive cardiomyopathy) (HCC)    a. Dx by echo 05/2014.   Hypothyroidism    IBS (irritable bowel syndrome)    Impaired fasting glucose    Low back pain    Lumbar radiculopathy    with negative MRI (1991)   Postmenopausal    with osteopenia, bisphosphonates 1/05-1/11, improved osteopenia 4/12-repeat planned late 2015   Shingles    2015   Thyroid nodule    Vitamin D deficiency    repleted on weekly vitamin D 29528 iu    Past Surgical History:  Procedure Laterality Date   ABDOMINAL HYSTERECTOMY     BREAST BIOPSY     x2   CATARACT EXTRACTION     removal with IOL bilateral   GLAUCOMA SURGERY     laser   skin cancer removal     THYROIDECTOMY     subtotal    Social History   Socioeconomic History   Marital status: Married    Spouse name: Not on file   Number of children: Not on file   Years of education: Not on file   Highest education level: Not on file  Occupational History   Not on file  Tobacco Use   Smoking status: Never   Smokeless tobacco: Never  Vaping Use   Vaping status: Never Used  Substance and Sexual Activity   Alcohol use: No    Alcohol/week: 0.0 standard drinks of alcohol   Drug use: Never   Sexual activity: Not on file  Other Topics Concern   Not on file  Social History  Narrative   Not on file   Social Determinants of Health   Financial Resource Strain: Not on file  Food Insecurity: No Food Insecurity (03/14/2023)   Hunger Vital Sign    Worried About Running Out of Food in the Last Year: Never true    Ran Out of Food in the Last Year: Never true  Transportation Needs: No Transportation Needs (03/14/2023)   PRAPARE - Administrator, Civil Service (Medical): No    Lack of Transportation (Non-Medical): No  Physical Activity: Not on file  Stress: Not on file  Social Connections: Not on file  Intimate Partner Violence: Not At Risk (03/14/2023)   Humiliation, Afraid, Rape, and Kick questionnaire    Fear of Current or Ex-Partner: No    Emotionally Abused: No    Physically Abused: No    Sexually Abused: No   Family History  Problem Relation Age of Onset   Breast cancer Mother    Hypertension Father    CVA Father    Heart failure Sister    CAD Sister    Kidney disease  Sister    COPD Brother    Lung cancer Brother    CAD Brother    Diabetes Brother    Stroke Father    Heart attack Neg Hx       VITAL SIGNS BP (!) 142/82   Pulse 81   Temp 97.9 F (36.6 C)   Resp 20   Ht 5\' 2"  (1.575 m)   Wt 130 lb 6.4 oz (59.1 kg)   SpO2 96%   BMI 23.85 kg/m   Outpatient Encounter Medications as of 05/15/2023  Medication Sig   acetaminophen (TYLENOL) 325 MG tablet Take 650 mg by mouth every 6 (six) hours.   amantadine (SYMMETREL) 50 MG/5ML solution Take 50 mg by mouth 2 (two) times daily.   ascorbic acid (VITAMIN C) 500 MG tablet Take 500 mg by mouth daily.   Cholecalciferol (VITAMIN D-3) 25 MCG (1000 UT) CAPS Take 1 capsule by mouth daily.   ciprofloxacin (CIPRO) 500 MG tablet Take 500 mg by mouth 2 (two) times daily.   cyanocobalamin (VITAMIN B12) 1000 MCG tablet Take 2,000 mcg by mouth daily.   donepezil (ARICEPT) 5 MG tablet Take 5 mg by mouth at bedtime.   feeding supplement (ENSURE ENLIVE / ENSURE PLUS) LIQD Take 237 mLs by mouth 2  (two) times daily between meals.   levothyroxine (SYNTHROID) 50 MCG tablet Take by mouth daily before breakfast.   memantine (NAMENDA XR) 28 MG CP24 24 hr capsule Take 28 mg by mouth daily.   Menthol, Topical Analgesic, (BIOFREEZE) 4 % GEL Apply 1 application  topically 3 (three) times daily as needed.   omeprazole (PRILOSEC) 20 MG capsule Take 20 mg by mouth 2 (two) times daily.   oxymetazoline (AFRIN) 0.05 % nasal spray Place 1 spray into both nostrils daily as needed for congestion.   Phenylephrine HCl (PREPARATION H RE) Place 1 suppository rectally in the morning and at bedtime.   polyethylene glycol (MIRALAX / GLYCOLAX) 17 g packet Take 17 g by mouth daily.   sertraline (ZOLOFT) 50 MG tablet Take 50 mg by mouth daily.   sodium chloride (OCEAN) 0.65 % SOLN nasal spray Place 1 spray into both nostrils 3 (three) times daily. And as needed   No facility-administered encounter medications on file as of 05/15/2023.     SIGNIFICANT DIAGNOSTIC EXAMS  PREVIOUS   08-09-22: chest x-ray: Mild prominent interstitial densities bilateral compatible with pneumonitis. Mild cardiomegaly; mild osteopenia; mild osteoarthritis.    03-14-23: ct of abdomen and pelvis:  1. No acute abnormality in the abdomen or pelvis. 2. Colonic diverticulosis without acute diverticulitis. Mildly distended rectum containing stool. 3. Diffuse peripheral reticulations and bilateral lower lobe traction bronchiectasis with scattered subsegmental mucous plugging, suggestive of chronic interstitial lung disease. 4. Irregular left lower lobe lung nodule measures 6 mm. Non-contrast chest CT at 6-12 months is recommended. If the nodule is stable at time of repeat CT, then future CT at 18-24 months (from today's scan) is considered optional for low-risk patients, but is recommended for high-risk patients.  5. Aortic Atherosclerosis. Coronary artery calcifications.   LABS REVIEWED PREVIOUS     08-09-22: wbc 9.7; hgb 14.3; hct 43.2;  mcv 89.6 plt 188; glucose 133; bun 26; creat 0.98; k+ 3.5; na++ 138; ca 8.7; gfr 55; ddimer: 0.95 crp 3.8 08-13-22: TSH 0.179; vitamin D 35.44  09-13-22: tsh 2.261 free t4: 0.95 11-20-22: urine culture: klebsiella pneumoniae  01-31-23: wbc 8.6; hgb 13.4; hct 40.9; mcv 92.7 plt 208; glucose 95; bun 20; creat 0.87;  k+ 3.3; na++ 138; ca 8.7; gfr >60; protein 6.1; albumin 3.4 hgb A1c 5.6; tsh 2.505; vitamin D 37.78; vitamin B 12: 207 vitamin B 6: 4.4 03-14-23: wbc 16.7; hgb 9.5; hct 28.1; mcv 96.6 plt 214; glucose 154; bun 34; creat 1.36; k+ 5.7; na++ 138; ca 9.1 gfr 37; protein 6.0 albumin 3.4 03-14-23 (#2) wbc 16.1; hgb 8.5; hct 26.3; mcv 96.0 plt  205; glucose 140; bun 38; creat 1.35; k+ 4.9; na++ 138; ca 8.9; gfr 38; protein 5.8; albumin 3.2 FOB +; blood culture: no growth 03-15-23: wbc 10.9; hgb 12.2; hct 36.9; mcv 92.7 plt 149; glucose 101; bun 25; creat 1.03 ;k+ 3.9; na++ 139; ca 8.4 gfr 52 03-17-23: wbc 8.5; hgb 11.4; hct 34.5; mcv 93.5 plt 151; glucose 99; bun 18;creat 0.89; k+ 3.6; na++ 138; ca 8.2 gfr >60; mag 2.3   TODAY  03-25-23: wbc 8.4; hgb 11.8; hct 37.1 mcv 95.9 plt 245  04-29-23: vitamin B 12: 491 05-13-23: urine culture: proteus mirabilis   Review of Systems  Constitutional:  Positive for malaise/fatigue.  Respiratory:  Negative for cough and shortness of breath.   Cardiovascular:  Negative for chest pain, palpitations and leg swelling.  Gastrointestinal:  Negative for abdominal pain, constipation and heartburn.  Musculoskeletal:  Positive for back pain. Negative for joint pain and myalgias.  Skin: Negative.   Neurological:  Negative for dizziness.  Psychiatric/Behavioral:  The patient is not nervous/anxious.     Physical Exam Constitutional:      General: She is not in acute distress.    Appearance: She is well-developed. She is not diaphoretic.  Neck:     Thyroid: No thyromegaly.  Cardiovascular:     Rate and Rhythm: Normal rate and regular rhythm.     Pulses: Normal  pulses.     Heart sounds: Normal heart sounds.  Pulmonary:     Effort: Pulmonary effort is normal. No respiratory distress.     Breath sounds: Normal breath sounds.  Abdominal:     General: Bowel sounds are normal. There is no distension.     Palpations: Abdomen is soft.     Tenderness: There is no abdominal tenderness.  Musculoskeletal:     Cervical back: Neck supple.     Right lower leg: No edema.     Left lower leg: No edema.     Comments: Left hemiplegia   Lymphadenopathy:     Cervical: No cervical adenopathy.  Skin:    General: Skin is warm and dry.  Neurological:     Mental Status: She is alert. Mental status is at baseline.  Psychiatric:        Mood and Affect: Mood normal.       ASSESSMENT/ PLAN:  TODAY  Acute UTI: will begin cipro 500 mg twice daily for 5 days will monitor    Synthia Innocent NP [facility]  call [phone]

## 2023-05-16 LAB — URINE CULTURE

## 2023-05-21 ENCOUNTER — Non-Acute Institutional Stay: Payer: Self-pay | Admitting: Adult Health

## 2023-05-21 ENCOUNTER — Encounter: Payer: Self-pay | Admitting: Adult Health

## 2023-05-21 DIAGNOSIS — G319 Degenerative disease of nervous system, unspecified: Secondary | ICD-10-CM

## 2023-05-21 DIAGNOSIS — F0150 Vascular dementia without behavioral disturbance: Secondary | ICD-10-CM

## 2023-05-21 DIAGNOSIS — R29818 Other symptoms and signs involving the nervous system: Secondary | ICD-10-CM

## 2023-05-21 DIAGNOSIS — R4189 Other symptoms and signs involving cognitive functions and awareness: Secondary | ICD-10-CM

## 2023-05-21 DIAGNOSIS — I10 Essential (primary) hypertension: Secondary | ICD-10-CM

## 2023-05-21 DIAGNOSIS — J849 Interstitial pulmonary disease, unspecified: Secondary | ICD-10-CM

## 2023-05-21 DIAGNOSIS — I421 Obstructive hypertrophic cardiomyopathy: Secondary | ICD-10-CM

## 2023-05-21 NOTE — Progress Notes (Unsigned)
Location:  [facility] Room Number: [id number] Place of Service:  SNF (31)   CODE STATUS: dnr   Allergies  Allergen Reactions   Hydrochlorothiazide     05/29/2021 K+ 2.6 Other reaction(s): Other (See Comments) 05/29/2021 K+ 2.6   Codeine Nausea Only    Chief Complaint  Patient presents with   Medical Management of Chronic Issues         Essential hypertension: Interstitial lung disease: HOCM (hypertrophic obstructive cardiomyopathy)  Vascular dementia without behavioral disturbance/neurocognitive deficits/cerebral atrophy     HPI:  She is a 89 year old long term resident of this facility being seen for the management of her chronic illnesses: Essential hypertension: Interstitial lung disease: HOCM (hypertrophic obstructive cardiomyopathy)  Vascular dementia without behavioral disturbance/neurocognitive deficits/cerebral atrophy. There are no reports of uncontrolled pain. There are no reports of anxiety or depressive thoughts.   Past Medical History:  Diagnosis Date   Decreased sense of smell    and taste-negative CT scan-2011   Depression    Essential hypertension    GERD (gastroesophageal reflux disease)    Heart murmur    heard first time 4/12   History of mammogram    2010 and she does not want to repeat them anymore   HOCM (hypertrophic obstructive cardiomyopathy) (HCC)    a. Dx by echo 05/2014.   Hypothyroidism    IBS (irritable bowel syndrome)    Impaired fasting glucose    Low back pain    Lumbar radiculopathy    with negative MRI (1991)   Postmenopausal    with osteopenia, bisphosphonates 1/05-1/11, improved osteopenia 4/12-repeat planned late 2015   Shingles    2015   Thyroid nodule    Vitamin D deficiency    repleted on weekly vitamin D 13086 iu    Past Surgical History:  Procedure Laterality Date   ABDOMINAL HYSTERECTOMY     BREAST BIOPSY     x2   CATARACT EXTRACTION     removal with IOL bilateral   GLAUCOMA SURGERY     laser    skin cancer removal     THYROIDECTOMY     subtotal    Social History   Socioeconomic History   Marital status: Married    Spouse name: Not on file   Number of children: Not on file   Years of education: Not on file   Highest education level: Not on file  Occupational History   Not on file  Tobacco Use   Smoking status: Never   Smokeless tobacco: Never  Vaping Use   Vaping status: Never Used  Substance and Sexual Activity   Alcohol use: No    Alcohol/week: 0.0 standard drinks of alcohol   Drug use: Never   Sexual activity: Not on file  Other Topics Concern   Not on file  Social History Narrative   Not on file   Social Determinants of Health   Financial Resource Strain: Not on file  Food Insecurity: No Food Insecurity (03/14/2023)   Hunger Vital Sign    Worried About Running Out of Food in the Last Year: Never true    Ran Out of Food in the Last Year: Never true  Transportation Needs: No Transportation Needs (03/14/2023)   PRAPARE - Administrator, Civil Service (Medical): No    Lack of Transportation (Non-Medical): No  Physical Activity: Not on file  Stress: Not on file  Social Connections: Not on file  Intimate Partner Violence:  Not At Risk (03/14/2023)   Humiliation, Afraid, Rape, and Kick questionnaire    Fear of Current or Ex-Partner: No    Emotionally Abused: No    Physically Abused: No    Sexually Abused: No   Family History  Problem Relation Age of Onset   Breast cancer Mother    Hypertension Father    CVA Father    Heart failure Sister    CAD Sister    Kidney disease Sister    COPD Brother    Lung cancer Brother    CAD Brother    Diabetes Brother    Stroke Father    Heart attack Neg Hx       VITAL SIGNS BP 133/84   Pulse 84   Temp (!) 97.1 F (36.2 C)   Resp 20   Ht 5\' 2"  (1.575 m)   Wt 130 lb 6.4 oz (59.1 kg)   SpO2 95%   BMI 23.85 kg/m   Outpatient Encounter Medications as of 05/21/2023  Medication Sig   acetaminophen  (TYLENOL) 325 MG tablet Take 650 mg by mouth every 6 (six) hours.   amantadine (SYMMETREL) 50 MG/5ML solution Take 50 mg by mouth 2 (two) times daily.   ascorbic acid (VITAMIN C) 500 MG tablet Take 500 mg by mouth daily.   Cholecalciferol (VITAMIN D-3) 25 MCG (1000 UT) CAPS Take 1 capsule by mouth daily.   cyanocobalamin (VITAMIN B12) 1000 MCG tablet Take 2,000 mcg by mouth daily.   donepezil (ARICEPT) 5 MG tablet Take 5 mg by mouth at bedtime.   feeding supplement (ENSURE ENLIVE / ENSURE PLUS) LIQD Take 237 mLs by mouth 2 (two) times daily between meals.   levothyroxine (SYNTHROID) 50 MCG tablet Take by mouth daily before breakfast.   memantine (NAMENDA XR) 28 MG CP24 24 hr capsule Take 28 mg by mouth daily.   Menthol, Topical Analgesic, (BIOFREEZE) 4 % GEL Apply 1 application  topically 3 (three) times daily as needed.   omeprazole (PRILOSEC) 20 MG capsule Take 20 mg by mouth 2 (two) times daily.   oxymetazoline (AFRIN) 0.05 % nasal spray Place 1 spray into both nostrils daily as needed for congestion.   Phenylephrine HCl (PREPARATION H RE) Place 1 suppository rectally in the morning and at bedtime.   polyethylene glycol (MIRALAX / GLYCOLAX) 17 g packet Take 17 g by mouth daily.   sertraline (ZOLOFT) 50 MG tablet Take 50 mg by mouth daily.   sodium chloride (OCEAN) 0.65 % SOLN nasal spray Place 1 spray into both nostrils 3 (three) times daily. And as needed   No facility-administered encounter medications on file as of 05/21/2023.     SIGNIFICANT DIAGNOSTIC EXAMS  PREVIOUS   08-09-22: chest x-ray: Mild prominent interstitial densities bilateral compatible with pneumonitis. Mild cardiomegaly; mild osteopenia; mild osteoarthritis.    03-14-23: ct of abdomen and pelvis:  1. No acute abnormality in the abdomen or pelvis. 2. Colonic diverticulosis without acute diverticulitis. Mildly distended rectum containing stool. 3. Diffuse peripheral reticulations and bilateral lower lobe traction  bronchiectasis with scattered subsegmental mucous plugging, suggestive of chronic interstitial lung disease. 4. Irregular left lower lobe lung nodule measures 6 mm. Non-contrast chest CT at 6-12 months is recommended. If the nodule is stable at time of repeat CT, then future CT at 18-24 months (from today's scan) is considered optional for low-risk patients, but is recommended for high-risk patients.  5. Aortic Atherosclerosis. Coronary artery calcifications.   LABS REVIEWED PREVIOUS     08-09-22:  wbc 9.7; hgb 14.3; hct 43.2; mcv 89.6 plt 188; glucose 133; bun 26; creat 0.98; k+ 3.5; na++ 138; ca 8.7; gfr 55; ddimer: 0.95 crp 3.8 08-13-22: TSH 0.179; vitamin D 35.44  09-13-22: tsh 2.261 free t4: 0.95 11-20-22: urine culture: klebsiella pneumoniae  01-31-23: wbc 8.6; hgb 13.4; hct 40.9; mcv 92.7 plt 208; glucose 95; bun 20; creat 0.87; k+ 3.3; na++ 138; ca 8.7; gfr >60; protein 6.1; albumin 3.4 hgb A1c 5.6; tsh 2.505; vitamin D 37.78; vitamin B 12: 207 vitamin B 6: 4.4 03-14-23: wbc 16.7; hgb 9.5; hct 28.1; mcv 96.6 plt 214; glucose 154; bun 34; creat 1.36; k+ 5.7; na++ 138; ca 9.1 gfr 37; protein 6.0 albumin 3.4 03-14-23 (#2) wbc 16.1; hgb 8.5; hct 26.3; mcv 96.0 plt  205; glucose 140; bun 38; creat 1.35; k+ 4.9; na++ 138; ca 8.9; gfr 38; protein 5.8; albumin 3.2 FOB +; blood culture: no growth 03-15-23: wbc 10.9; hgb 12.2; hct 36.9; mcv 92.7 plt 149; glucose 101; bun 25; creat 1.03 ;k+ 3.9; na++ 139; ca 8.4 gfr 52 03-17-23: wbc 8.5; hgb 11.4; hct 34.5; mcv 93.5 plt 151; glucose 99; bun 18;creat 0.89; k+ 3.6; na++ 138; ca 8.2 gfr >60; mag 2.3   TODAY  03-25-23: wbc 8.4; hgb 11.8; hct 37.1 mcv 95.9 plt 245  04-29-23: vitamin B 12: 491 05-13-23: urine culture: proteus mirabilis  Review of Systems  Constitutional:  Negative for malaise/fatigue.  Respiratory:  Negative for cough and shortness of breath.   Cardiovascular:  Negative for chest pain, palpitations and leg swelling.  Gastrointestinal:  Negative  for abdominal pain, constipation and heartburn.  Musculoskeletal:  Negative for back pain, joint pain and myalgias.  Skin: Negative.   Neurological:  Negative for dizziness.  Psychiatric/Behavioral:  The patient is not nervous/anxious.     Physical Exam Constitutional:      General: She is not in acute distress.    Appearance: She is well-developed. She is not diaphoretic.  Neck:     Thyroid: No thyromegaly.  Cardiovascular:     Rate and Rhythm: Normal rate and regular rhythm.     Pulses: Normal pulses.     Heart sounds: Normal heart sounds.  Pulmonary:     Effort: Pulmonary effort is normal. No respiratory distress.     Breath sounds: Normal breath sounds.  Abdominal:     General: Bowel sounds are normal. There is no distension.     Palpations: Abdomen is soft.     Tenderness: There is no abdominal tenderness.  Musculoskeletal:     Cervical back: Neck supple.     Right lower leg: No edema.     Left lower leg: No edema.     Comments: Left hemiplegia   Lymphadenopathy:     Cervical: No cervical adenopathy.  Skin:    General: Skin is warm and dry.  Neurological:     Mental Status: She is alert. Mental status is at baseline.  Psychiatric:        Mood and Affect: Mood normal.      ASSESSMENT/ PLAN:  TODAY  Essential hypertension: b/p 133/84 will monitor   2. Interstitial lung disease: is stable  3. HOCM (hypertrophic obstructive cardiomyopathy) will monitor   4. Vascular dementia without behavioral disturbance/neurocognitive deficits/cerebral atrophy: weight is 130 pounds; will continue aricept 5 mg daily and namenda xr 28 mg daily   PREVIOUS   5. Hypoalbuminemia due to protein calorie malnutrition: albumin 3.4 will continue supplements as directed  6.  Vitamin  B 12 deficiency: level is 207; will continue 2,00 mcg daily   7. Vitamin D deficiency: level is 37.78 will continue 1,00 units daily  8. Postoperative hypothyroidism: tsh 2.505 will continue synthroid  50 mcg daily   9.  Mixed hyperlipidemia: is off statin due to advanced age  29. Psychosis in elderly without behavioral disturbance: is off seroquel  11. Depression, recurrent: will continue zoloft 50 mg daily   12. GERD without esophagitis: will continue protonix 40 mg daily   13. Chronic constipation: will continue miralax daily  14. Thoracic aortic atherosclerosis (cxr 12-15-14) not on statin due to advanced age  53. History of CVA/left hemiplegia: is off asa due to GI bleed  16. Somnolence post stroke: is on amantadine 50 mg twice daily  17. Chronic generalized pain: will continue tylenol 650 mg every 6 hours  18. Dysphagia: no indications of aspiration present: is on thin liquids  19. Stage 3a chronic kidney disease bun 18; creat 0.89 gfr >60   20. Gastrointestinal hemorrhage associated with diverticulosis: did receive packed cells; hgb is presently stable the bleeding felt more than likely from diverticulosis.    Will check hgb/hct  Synthia Innocent NP [facility]  call [phone]

## 2023-05-23 ENCOUNTER — Other Ambulatory Visit (HOSPITAL_COMMUNITY)
Admission: RE | Admit: 2023-05-23 | Discharge: 2023-05-23 | Disposition: A | Discharge status: Home / Self Care | Payer: Medicare Other | Source: Skilled Nursing Facility | Attending: Adult Health | Admitting: Adult Health

## 2023-05-23 DIAGNOSIS — D649 Anemia, unspecified: Secondary | ICD-10-CM | POA: Insufficient documentation

## 2023-05-23 LAB — HEMOGLOBIN AND HEMATOCRIT, BLOOD
HCT: 42 % (ref 36.0–46.0)
Hemoglobin: 13.5 g/dL (ref 12.0–15.0)

## 2023-06-06 ENCOUNTER — Encounter: Payer: Self-pay | Admitting: Adult Health

## 2023-06-06 ENCOUNTER — Other Ambulatory Visit (HOSPITAL_COMMUNITY)
Admission: RE | Admit: 2023-06-06 | Discharge: 2023-06-06 | Disposition: A | Discharge status: Home / Self Care | Payer: Medicare Other | Source: Skilled Nursing Facility | Attending: Internal Medicine | Admitting: Internal Medicine

## 2023-06-06 ENCOUNTER — Non-Acute Institutional Stay (SKILLED_NURSING_FACILITY): Payer: Medicare Other | Admitting: Adult Health

## 2023-06-06 DIAGNOSIS — U071 COVID-19: Secondary | ICD-10-CM | POA: Insufficient documentation

## 2023-06-06 DIAGNOSIS — R768 Other specified abnormal immunological findings in serum: Secondary | ICD-10-CM | POA: Diagnosis not present

## 2023-06-06 LAB — CBC
HCT: 41.9 % (ref 36.0–46.0)
Hemoglobin: 13.8 g/dL (ref 12.0–15.0)
MCH: 29.8 pg (ref 26.0–34.0)
MCHC: 32.9 g/dL (ref 30.0–36.0)
MCV: 90.5 fL (ref 80.0–100.0)
Platelets: 206 10*3/uL (ref 150–400)
RBC: 4.63 MIL/uL (ref 3.87–5.11)
RDW: 13.7 % (ref 11.5–15.5)
WBC: 9.9 10*3/uL (ref 4.0–10.5)
nRBC: 0 % (ref 0.0–0.2)

## 2023-06-06 LAB — BASIC METABOLIC PANEL
Anion gap: 8 (ref 5–15)
BUN: 18 mg/dL (ref 8–23)
CO2: 27 mmol/L (ref 22–32)
Calcium: 8.9 mg/dL (ref 8.9–10.3)
Chloride: 101 mmol/L (ref 98–111)
Creatinine, Ser: 1.08 mg/dL — ABNORMAL HIGH (ref 0.44–1.00)
GFR, Estimated: 49 mL/min — ABNORMAL LOW (ref >60)
Glucose, Bld: 97 mg/dL (ref 70–99)
Potassium: 3.4 mmol/L — ABNORMAL LOW (ref 3.5–5.1)
Sodium: 136 mmol/L (ref 135–145)

## 2023-06-06 LAB — C-REACTIVE PROTEIN: CRP: 4.9 mg/dL — ABNORMAL HIGH (ref <1.0)

## 2023-06-06 LAB — D-DIMER, QUANTITATIVE: D-Dimer, Quant: 0.56 ug{FEU}/mL — ABNORMAL HIGH (ref 0.00–0.50)

## 2023-06-06 NOTE — Progress Notes (Signed)
Location:  [facility] Room Number: [id number] Place of Service:  SNF (31)   CODE STATUS: dnr   Allergies  Allergen Reactions   Hydrochlorothiazide     05/29/2021 K+ 2.6 Other reaction(s): Other (See Comments) 05/29/2021 K+ 2.6   Codeine Nausea Only    Chief Complaint  Patient presents with   Acute Visit    Covid +     HPI:  She has tested positive for COVID. There are no reports of fevers present. There are no reports of cough; or sore throat. She will need to start antiviral medication.   Past Medical History:  Diagnosis Date   Decreased sense of smell    and taste-negative CT scan-2011   Depression    Essential hypertension    GERD (gastroesophageal reflux disease)    Heart murmur    heard first time 4/12   History of mammogram    2010 and she does not want to repeat them anymore   HOCM (hypertrophic obstructive cardiomyopathy) (HCC)    a. Dx by echo 05/2014.   Hypothyroidism    IBS (irritable bowel syndrome)    Impaired fasting glucose    Low back pain    Lumbar radiculopathy    with negative MRI (1991)   Postmenopausal    with osteopenia, bisphosphonates 1/05-1/11, improved osteopenia 4/12-repeat planned late 2015   Shingles    2015   Thyroid nodule    Vitamin D deficiency    repleted on weekly vitamin D 40981 iu    Past Surgical History:  Procedure Laterality Date   ABDOMINAL HYSTERECTOMY     BREAST BIOPSY     x2   CATARACT EXTRACTION     removal with IOL bilateral   GLAUCOMA SURGERY     laser   skin cancer removal     THYROIDECTOMY     subtotal    Social History   Socioeconomic History   Marital status: Married    Spouse name: Not on file   Number of children: Not on file   Years of education: Not on file   Highest education level: Not on file  Occupational History   Not on file  Tobacco Use   Smoking status: Never   Smokeless tobacco: Never  Vaping Use   Vaping status: Never Used  Substance and Sexual Activity    Alcohol use: No    Alcohol/week: 0.0 standard drinks of alcohol   Drug use: Never   Sexual activity: Not on file  Other Topics Concern   Not on file  Social History Narrative   Not on file   Social Determinants of Health   Financial Resource Strain: Not on file  Food Insecurity: No Food Insecurity (03/14/2023)   Hunger Vital Sign    Worried About Running Out of Food in the Last Year: Never true    Ran Out of Food in the Last Year: Never true  Transportation Needs: No Transportation Needs (03/14/2023)   PRAPARE - Administrator, Civil Service (Medical): No    Lack of Transportation (Non-Medical): No  Physical Activity: Not on file  Stress: Not on file  Social Connections: Not on file  Intimate Partner Violence: Not At Risk (03/14/2023)   Humiliation, Afraid, Rape, and Kick questionnaire    Fear of Current or Ex-Partner: No    Emotionally Abused: No    Physically Abused: No    Sexually Abused: No   Family History  Problem Relation Age of  Onset   Breast cancer Mother    Hypertension Father    CVA Father    Heart failure Sister    CAD Sister    Kidney disease Sister    COPD Brother    Lung cancer Brother    CAD Brother    Diabetes Brother    Stroke Father    Heart attack Neg Hx       VITAL SIGNS BP (!) 146/76   Pulse 75   Temp (!) 97.3 F (36.3 C)   Resp 20   Ht 5\' 2"  (1.575 m)   Wt 128 lb 9.6 oz (58.3 kg)   SpO2 93%   BMI 23.52 kg/m   Outpatient Encounter Medications as of 06/06/2023  Medication Sig   acetaminophen (TYLENOL) 325 MG tablet Take 650 mg by mouth every 6 (six) hours.   amantadine (SYMMETREL) 50 MG/5ML solution Take 50 mg by mouth 2 (two) times daily.   ascorbic acid (VITAMIN C) 500 MG tablet Take 500 mg by mouth daily.   Cholecalciferol (VITAMIN D-3) 25 MCG (1000 UT) CAPS Take 1 capsule by mouth daily.   cyanocobalamin (VITAMIN B12) 1000 MCG tablet Take 2,000 mcg by mouth daily.   donepezil (ARICEPT) 5 MG tablet Take 5 mg by mouth at  bedtime.   feeding supplement (ENSURE ENLIVE / ENSURE PLUS) LIQD Take 237 mLs by mouth 2 (two) times daily between meals.   levothyroxine (SYNTHROID) 50 MCG tablet Take by mouth daily before breakfast.   memantine (NAMENDA XR) 28 MG CP24 24 hr capsule Take 28 mg by mouth daily.   Menthol, Topical Analgesic, (BIOFREEZE) 4 % GEL Apply 1 application  topically 3 (three) times daily as needed.   omeprazole (PRILOSEC) 20 MG capsule Take 20 mg by mouth 2 (two) times daily.   oxymetazoline (AFRIN) 0.05 % nasal spray Place 1 spray into both nostrils daily as needed for congestion.   Phenylephrine HCl (PREPARATION H RE) Place 1 suppository rectally in the morning and at bedtime.   polyethylene glycol (MIRALAX / GLYCOLAX) 17 g packet Take 17 g by mouth daily.   sertraline (ZOLOFT) 50 MG tablet Take 50 mg by mouth daily.   sodium chloride (OCEAN) 0.65 % SOLN nasal spray Place 1 spray into both nostrils 3 (three) times daily. And as needed   No facility-administered encounter medications on file as of 06/06/2023.     SIGNIFICANT DIAGNOSTIC EXAMS  PREVIOUS   08-09-22: chest x-ray: Mild prominent interstitial densities bilateral compatible with pneumonitis. Mild cardiomegaly; mild osteopenia; mild osteoarthritis.    03-14-23: ct of abdomen and pelvis:  1. No acute abnormality in the abdomen or pelvis. 2. Colonic diverticulosis without acute diverticulitis. Mildly distended rectum containing stool. 3. Diffuse peripheral reticulations and bilateral lower lobe traction bronchiectasis with scattered subsegmental mucous plugging, suggestive of chronic interstitial lung disease. 4. Irregular left lower lobe lung nodule measures 6 mm. Non-contrast chest CT at 6-12 months is recommended. If the nodule is stable at time of repeat CT, then future CT at 18-24 months (from today's scan) is considered optional for low-risk patients, but is recommended for high-risk patients.  5. Aortic Atherosclerosis. Coronary  artery calcifications.   LABS REVIEWED PREVIOUS     08-09-22: wbc 9.7; hgb 14.3; hct 43.2; mcv 89.6 plt 188; glucose 133; bun 26; creat 0.98; k+ 3.5; na++ 138; ca 8.7; gfr 55; ddimer: 0.95 crp 3.8 08-13-22: TSH 0.179; vitamin D 35.44  09-13-22: tsh 2.261 free t4: 0.95 11-20-22: urine culture: klebsiella pneumoniae  01-31-23: wbc 8.6; hgb 13.4; hct 40.9; mcv 92.7 plt 208; glucose 95; bun 20; creat 0.87; k+ 3.3; na++ 138; ca 8.7; gfr >60; protein 6.1; albumin 3.4 hgb A1c 5.6; tsh 2.505; vitamin D 37.78; vitamin B 12: 207 vitamin B 6: 4.4 03-14-23: wbc 16.7; hgb 9.5; hct 28.1; mcv 96.6 plt 214; glucose 154; bun 34; creat 1.36; k+ 5.7; na++ 138; ca 9.1 gfr 37; protein 6.0 albumin 3.4 03-14-23 (#2) wbc 16.1; hgb 8.5; hct 26.3; mcv 96.0 plt  205; glucose 140; bun 38; creat 1.35; k+ 4.9; na++ 138; ca 8.9; gfr 38; protein 5.8; albumin 3.2 FOB +; blood culture: no growth 03-15-23: wbc 10.9; hgb 12.2; hct 36.9; mcv 92.7 plt 149; glucose 101; bun 25; creat 1.03 ;k+ 3.9; na++ 139; ca 8.4 gfr 52 03-17-23: wbc 8.5; hgb 11.4; hct 34.5; mcv 93.5 plt 151; glucose 99; bun 18;creat 0.89; k+ 3.6; na++ 138; ca 8.2 gfr >60; mag 2.3   TODAY  03-25-23: wbc 8.4; hgb 11.8; hct 37.1 mcv 95.9 plt 245  04-29-23: vitamin B 12: 491 05-13-23: urine culture: proteus mirabilis 06-06-23: wbc 9.9; hgb 13.8; hct 41.9; mcv 90.5 plt 206; glucose 97; bun 18; creat 1.08; k+ 3.4; na++ 136; ca 8.9 gfr 49; d-dimer 0.56    Review of Systems  Constitutional:  Negative for malaise/fatigue.  Respiratory:  Negative for cough and shortness of breath.   Cardiovascular:  Negative for chest pain, palpitations and leg swelling.  Gastrointestinal:  Negative for abdominal pain, constipation and heartburn.  Musculoskeletal:  Negative for back pain, joint pain and myalgias.  Skin: Negative.   Neurological:  Negative for dizziness.  Psychiatric/Behavioral:  The patient is not nervous/anxious.    Physical Exam Constitutional:      General: She is not in  acute distress.    Appearance: She is well-developed. She is not diaphoretic.  Neck:     Thyroid: No thyromegaly.  Cardiovascular:     Rate and Rhythm: Normal rate and regular rhythm.     Pulses: Normal pulses.     Heart sounds: Normal heart sounds.  Pulmonary:     Effort: Pulmonary effort is normal. No respiratory distress.     Breath sounds: Normal breath sounds.  Abdominal:     General: Bowel sounds are normal. There is no distension.     Palpations: Abdomen is soft.     Tenderness: There is no abdominal tenderness.  Musculoskeletal:     Cervical back: Neck supple.     Right lower leg: No edema.     Left lower leg: No edema.     Comments: Left hemiplegia   Lymphadenopathy:     Cervical: No cervical adenopathy.  Skin:    General: Skin is warm and dry.  Neurological:     Mental Status: She is alert. Mental status is at baseline.     Comments: 03-19-23; SLUMS 9/30  Psychiatric:        Mood and Affect: Mood normal.       ASSESSMENT/ PLAN:  TODAY  SARS CoV 2 antibody positive: will complete molunpiravir will give k+ 20 meq today. Will continue to monitor her status; and will await crp results.    Synthia Innocent NP [facility]  call [phone]

## 2023-06-07 ENCOUNTER — Non-Acute Institutional Stay (SKILLED_NURSING_FACILITY): Payer: Self-pay | Admitting: Adult Health

## 2023-06-07 DIAGNOSIS — R7982 Elevated C-reactive protein (CRP): Secondary | ICD-10-CM

## 2023-06-07 DIAGNOSIS — R768 Other specified abnormal immunological findings in serum: Secondary | ICD-10-CM

## 2023-06-07 NOTE — Progress Notes (Signed)
Location:  [facility] Room Number: [id number] Place of Service:  SNF (31)   CODE STATUS: dnr   Allergies  Allergen Reactions   Hydrochlorothiazide     05/29/2021 K+ 2.6 Other reaction(s): Other (See Comments) 05/29/2021 K+ 2.6   Codeine Nausea Only    Chief Complaint  Patient presents with   Acute Visit    Follow up lab.     HPI:  She has tested for covid and has been started on antiviral medication. She is tolerating without difficulty. Her CRP returned elevated at 4.9.  she will need steroid therapy to reduce her inflammatory process.   Past Medical History:  Diagnosis Date   Decreased sense of smell    and taste-negative CT scan-2011   Depression    Essential hypertension    GERD (gastroesophageal reflux disease)    Heart murmur    heard first time 4/12   History of mammogram    2010 and she does not want to repeat them anymore   HOCM (hypertrophic obstructive cardiomyopathy) (HCC)    a. Dx by echo 05/2014.   Hypothyroidism    IBS (irritable bowel syndrome)    Impaired fasting glucose    Low back pain    Lumbar radiculopathy    with negative MRI (1991)   Postmenopausal    with osteopenia, bisphosphonates 1/05-1/11, improved osteopenia 4/12-repeat planned late 2015   Shingles    2015   Thyroid nodule    Vitamin D deficiency    repleted on weekly vitamin D 40981 iu    Past Surgical History:  Procedure Laterality Date   ABDOMINAL HYSTERECTOMY     BREAST BIOPSY     x2   CATARACT EXTRACTION     removal with IOL bilateral   GLAUCOMA SURGERY     laser   skin cancer removal     THYROIDECTOMY     subtotal    Social History   Socioeconomic History   Marital status: Married    Spouse name: Not on file   Number of children: Not on file   Years of education: Not on file   Highest education level: Not on file  Occupational History   Not on file  Tobacco Use   Smoking status: Never   Smokeless tobacco: Never  Vaping Use   Vaping  status: Never Used  Substance and Sexual Activity   Alcohol use: No    Alcohol/week: 0.0 standard drinks of alcohol   Drug use: Never   Sexual activity: Not on file  Other Topics Concern   Not on file  Social History Narrative   Not on file   Social Determinants of Health   Financial Resource Strain: Not on file  Food Insecurity: No Food Insecurity (03/14/2023)   Hunger Vital Sign    Worried About Running Out of Food in the Last Year: Never true    Ran Out of Food in the Last Year: Never true  Transportation Needs: No Transportation Needs (03/14/2023)   PRAPARE - Administrator, Civil Service (Medical): No    Lack of Transportation (Non-Medical): No  Physical Activity: Not on file  Stress: Not on file  Social Connections: Not on file  Intimate Partner Violence: Not At Risk (03/14/2023)   Humiliation, Afraid, Rape, and Kick questionnaire    Fear of Current or Ex-Partner: No    Emotionally Abused: No    Physically Abused: No    Sexually Abused: No   Family  History  Problem Relation Age of Onset   Breast cancer Mother    Hypertension Father    CVA Father    Heart failure Sister    CAD Sister    Kidney disease Sister    COPD Brother    Lung cancer Brother    CAD Brother    Diabetes Brother    Stroke Father    Heart attack Neg Hx       VITAL SIGNS BP 126/72   Pulse 80   Temp (!) 97.1 F (36.2 C)   Resp 18   Ht 5\' 2"  (1.575 m)   Wt 128 lb 9.6 oz (58.3 kg)   SpO2 96%   BMI 23.52 kg/m   Outpatient Encounter Medications as of 06/07/2023  Medication Sig   acetaminophen (TYLENOL) 325 MG tablet Take 650 mg by mouth every 6 (six) hours.   amantadine (SYMMETREL) 50 MG/5ML solution Take 50 mg by mouth 2 (two) times daily.   ascorbic acid (VITAMIN C) 500 MG tablet Take 500 mg by mouth daily.   Cholecalciferol (VITAMIN D-3) 25 MCG (1000 UT) CAPS Take 1 capsule by mouth daily.   cyanocobalamin (VITAMIN B12) 1000 MCG tablet Take 2,000 mcg by mouth daily.    donepezil (ARICEPT) 5 MG tablet Take 5 mg by mouth at bedtime.   feeding supplement (ENSURE ENLIVE / ENSURE PLUS) LIQD Take 237 mLs by mouth 2 (two) times daily between meals.   levothyroxine (SYNTHROID) 50 MCG tablet Take by mouth daily before breakfast.   memantine (NAMENDA XR) 28 MG CP24 24 hr capsule Take 28 mg by mouth daily.   Menthol, Topical Analgesic, (BIOFREEZE) 4 % GEL Apply 1 application  topically 3 (three) times daily as needed.   omeprazole (PRILOSEC) 20 MG capsule Take 20 mg by mouth 2 (two) times daily.   oxymetazoline (AFRIN) 0.05 % nasal spray Place 1 spray into both nostrils daily as needed for congestion.   Phenylephrine HCl (PREPARATION H RE) Place 1 suppository rectally in the morning and at bedtime.   polyethylene glycol (MIRALAX / GLYCOLAX) 17 g packet Take 17 g by mouth daily.   sertraline (ZOLOFT) 50 MG tablet Take 50 mg by mouth daily.   sodium chloride (OCEAN) 0.65 % SOLN nasal spray Place 1 spray into both nostrils 3 (three) times daily. And as needed   No facility-administered encounter medications on file as of 06/07/2023.     SIGNIFICANT DIAGNOSTIC EXAMS  PREVIOUS   08-09-22: chest x-ray: Mild prominent interstitial densities bilateral compatible with pneumonitis. Mild cardiomegaly; mild osteopenia; mild osteoarthritis.    03-14-23: ct of abdomen and pelvis:  1. No acute abnormality in the abdomen or pelvis. 2. Colonic diverticulosis without acute diverticulitis. Mildly distended rectum containing stool. 3. Diffuse peripheral reticulations and bilateral lower lobe traction bronchiectasis with scattered subsegmental mucous plugging, suggestive of chronic interstitial lung disease. 4. Irregular left lower lobe lung nodule measures 6 mm. Non-contrast chest CT at 6-12 months is recommended. If the nodule is stable at time of repeat CT, then future CT at 18-24 months (from today's scan) is considered optional for low-risk patients, but is recommended for high-risk  patients.  5. Aortic Atherosclerosis. Coronary artery calcifications.   LABS REVIEWED PREVIOUS     08-09-22: wbc 9.7; hgb 14.3; hct 43.2; mcv 89.6 plt 188; glucose 133; bun 26; creat 0.98; k+ 3.5; na++ 138; ca 8.7; gfr 55; ddimer: 0.95 crp 3.8 08-13-22: TSH 0.179; vitamin D 35.44  09-13-22: tsh 2.261 free t4: 0.95 11-20-22:  urine culture: klebsiella pneumoniae  01-31-23: wbc 8.6; hgb 13.4; hct 40.9; mcv 92.7 plt 208; glucose 95; bun 20; creat 0.87; k+ 3.3; na++ 138; ca 8.7; gfr >60; protein 6.1; albumin 3.4 hgb A1c 5.6; tsh 2.505; vitamin D 37.78; vitamin B 12: 207 vitamin B 6: 4.4 03-14-23: wbc 16.7; hgb 9.5; hct 28.1; mcv 96.6 plt 214; glucose 154; bun 34; creat 1.36; k+ 5.7; na++ 138; ca 9.1 gfr 37; protein 6.0 albumin 3.4 03-14-23 (#2) wbc 16.1; hgb 8.5; hct 26.3; mcv 96.0 plt  205; glucose 140; bun 38; creat 1.35; k+ 4.9; na++ 138; ca 8.9; gfr 38; protein 5.8; albumin 3.2 FOB +; blood culture: no growth 03-15-23: wbc 10.9; hgb 12.2; hct 36.9; mcv 92.7 plt 149; glucose 101; bun 25; creat 1.03 ;k+ 3.9; na++ 139; ca 8.4 gfr 52 03-17-23: wbc 8.5; hgb 11.4; hct 34.5; mcv 93.5 plt 151; glucose 99; bun 18;creat 0.89; k+ 3.6; na++ 138; ca 8.2 gfr >60; mag 2.3   TODAY  03-25-23: wbc 8.4; hgb 11.8; hct 37.1 mcv 95.9 plt 245  04-29-23: vitamin B 12: 491 05-13-23: urine culture: proteus mirabilis 06-06-23: wbc 9.9; hgb 13.8; hct 41.9; mcv 90.5 plt 206; glucose 97; bun 18; creat 1.08; k+ 3.4; na++ 136; ca 8.9 gfr 49; d-dimer 0.56  CRP 4.9  Review of Systems  Constitutional:  Negative for malaise/fatigue.  Respiratory:  Negative for cough and shortness of breath.   Cardiovascular:  Negative for chest pain, palpitations and leg swelling.  Gastrointestinal:  Negative for abdominal pain, constipation and heartburn.  Musculoskeletal:  Negative for back pain, joint pain and myalgias.  Skin: Negative.   Neurological:  Negative for dizziness.  Psychiatric/Behavioral:  The patient is not nervous/anxious.     Physical Exam Constitutional:      General: She is not in acute distress.    Appearance: She is well-developed. She is not diaphoretic.  Neck:     Thyroid: No thyromegaly.  Cardiovascular:     Rate and Rhythm: Normal rate and regular rhythm.     Pulses: Normal pulses.     Heart sounds: Normal heart sounds.  Pulmonary:     Effort: Pulmonary effort is normal. No respiratory distress.     Breath sounds: Normal breath sounds.  Abdominal:     General: Bowel sounds are normal. There is no distension.     Palpations: Abdomen is soft.     Tenderness: There is no abdominal tenderness.  Musculoskeletal:     Cervical back: Neck supple.     Right lower leg: No edema.     Left lower leg: No edema.     Comments: Left hemiplegia   Lymphadenopathy:     Cervical: No cervical adenopathy.  Skin:    General: Skin is warm and dry.  Neurological:     Mental Status: She is alert. Mental status is at baseline.     Comments: 03-19-23; SLUMS 9/30   Psychiatric:        Mood and Affect: Mood normal.      ASSESSMENT/ PLAN:  TODAY  Elevated CRP SARS CoV 2 antibody positive  Will begin prednisone 40 mg daily through 06-10-23 and will monitor her status Will complete antiviral medications.    Synthia Innocent NP [facility]   call [phone]

## 2023-06-20 ENCOUNTER — Non-Acute Institutional Stay (SKILLED_NURSING_FACILITY): Payer: Medicare Other | Admitting: Internal Medicine

## 2023-06-20 ENCOUNTER — Encounter: Payer: Self-pay | Admitting: Internal Medicine

## 2023-06-20 DIAGNOSIS — E890 Postprocedural hypothyroidism: Secondary | ICD-10-CM

## 2023-06-20 DIAGNOSIS — R4189 Other symptoms and signs involving cognitive functions and awareness: Secondary | ICD-10-CM

## 2023-06-20 DIAGNOSIS — N1831 Chronic kidney disease, stage 3a: Secondary | ICD-10-CM | POA: Diagnosis not present

## 2023-06-20 DIAGNOSIS — R768 Other specified abnormal immunological findings in serum: Secondary | ICD-10-CM

## 2023-06-20 DIAGNOSIS — R29818 Other symptoms and signs involving the nervous system: Secondary | ICD-10-CM | POA: Diagnosis not present

## 2023-06-20 NOTE — Progress Notes (Signed)
NURSING HOME LOCATION:  [facility] ROOM NUMBER:  [id number]  CODE STATUS:  DNR  PCP:  Synthia Innocent NP  This is a nursing facility follow up visit of chronic medical diagnoses & to document compliance with Regulation 483.30 (c) in The Long Term Care Survey Manual Phase 2 which mandates caregiver visit ( visits can alternate among physician, PA or NP as per statutes) within 10 days of 30 days / 60 days/ 90 days post admission to SNF date    Interim medical record and care since last SNF visit was updated with review of diagnostic studies and change in clinical status since last visit were documented.  HPI: She is a permanent resident of this facility with medical diagnoses of essential hypertension, GERD, hypertrophic obstructive cardiomyopathy, hypothyroidism, IBS, lumbar radiculopathy, vitamin D deficiency, and dementia. Earlier this month she was diagnosed with acute COVID infection and the SNF protocol was followed.  Labs revealed stage IIIa CKD with a creatinine of 1.08 and GFR of 49 up from a prior value of 0.89.Marland Kitchen  In May the creatinine ranged from a high of 1.36 down to 1.03; she is basically at her baseline. GFR was 37 so  actually there has been some improvement in renal function since that time.  CBC was normal.  C-reactive protein was 4.9 and D-dimer 0.56. Serially her TSH has been therapeutic; it was last checked 01/31/2023 with a value of 2.505.  Review of systems: Dementia invalidated responses.  She could not give me the current date.  She stated that she was "fine" but "sleepy."  She does validate some decreased vision in the left eye.  She states that "my left hand is dead."  She validates that she had COVID recently.  She denies any active cardiopulmonary symptoms.  Constitutional: No fever, significant weight change  Eyes: No redness, discharge, pain ENT/mouth: No nasal congestion,  purulent discharge, earache, change in hearing, sore throat  Cardiovascular: No  chest pain, palpitations, paroxysmal nocturnal dyspnea, claudication, edema  Respiratory: No cough, sputum production, hemoptysis, DOE, significant snoring, apnea   Gastrointestinal: No heartburn, dysphagia, abdominal pain, nausea /vomiting, rectal bleeding, melena, change in bowels Genitourinary: No dysuria, hematuria, pyuria, incontinence, nocturia Musculoskeletal: No joint stiffness, joint swelling, weakness, pain Dermatologic: No rash, pruritus, change in appearance of skin Neurologic: No dizziness, headache, syncope, seizures Psychiatric: No significant anxiety, depression, insomnia, anorexia Endocrine: No change in hair/skin/nails, excessive thirst, excessive hunger, excessive urination  Hematologic/lymphatic: No significant bruising, lymphadenopathy, abnormal bleeding Allergy/immunology: No itchy/watery eyes, significant sneezing, urticaria, angioedema  Physical exam:  Pertinent or positive findings: Initially she was asleep exhibiting hypopnea without apnea or snoring.  She appears her age and somewhat suboptimally nourished.  Voice is weak.  The lower lids are puffy.  She has dry rales at the bases, greater on the right than the left.  There is 1/2+ edema right lower extremity and 1+ on the left.  Left upper extremity is in a sling.  Interosseous wasting of the hands is present.  General appearance:  no acute distress, increased work of breathing is present.   Lymphatic: No lymphadenopathy about the head, neck, axilla. Eyes: No conjunctival inflammation or lid edema is present. There is no scleral icterus. Ears:  External ear exam shows no significant lesions or deformities.   Nose:  External nasal examination shows no deformity or inflammation. Nasal mucosa are pink and moist without lesions, exudates Oral exam:  Lips and gums are healthy appearing. There is no oropharyngeal erythema  or exudate. Neck:  No thyromegaly, masses, tenderness noted.    Heart:  Normal rate and regular  rhythm. S1 and S2 normal without gallop, murmur, click, rub .  Lungs:  without wheezes, rhonchi, rubs. Abdomen: Bowel sounds are normal. Abdomen is soft and nontender with no organomegaly, hernias, masses. GU: Deferred  Extremities:  No cyanosis, clubbing Neurologic exam :Balance, Rhomberg, finger to nose testing could not be completed due to clinical state Skin: Warm & dry w/o tenting. No significant lesions or rash.  See summary under each active problem in the Problem List with associated updated therapeutic plan

## 2023-06-20 NOTE — Assessment & Plan Note (Addendum)
Serially there has been some improvement since May in her renal function.  She is now CKD stage IIIa.  Med list reviewed; no change in medications indicated.

## 2023-06-20 NOTE — Patient Instructions (Signed)
See assessment and plan under each diagnosis in the problem list and acutely for this visit 

## 2023-06-20 NOTE — Assessment & Plan Note (Addendum)
SNF protocol for COVID was followed.  At this time she denies any residual COVID symptoms.  The dry rales in the the lower lobes are pre-existing and relates to documented interstitial fibrosis.

## 2023-06-20 NOTE — Assessment & Plan Note (Signed)
The most recent TSH was 2.505 on 01/31/2023.  This will be updated because of her complaint of excessive somnolence.

## 2023-06-21 NOTE — Assessment & Plan Note (Signed)
She is pleasantly demented w/o behavioral issues. No change indicated in meds.

## 2023-06-26 ENCOUNTER — Non-Acute Institutional Stay (SKILLED_NURSING_FACILITY): Payer: Medicare Other | Admitting: Adult Health

## 2023-06-26 ENCOUNTER — Encounter: Payer: Self-pay | Admitting: Adult Health

## 2023-06-26 DIAGNOSIS — Z8673 Personal history of transient ischemic attack (TIA), and cerebral infarction without residual deficits: Secondary | ICD-10-CM | POA: Diagnosis not present

## 2023-06-26 DIAGNOSIS — R634 Abnormal weight loss: Secondary | ICD-10-CM

## 2023-06-26 DIAGNOSIS — Z66 Do not resuscitate: Secondary | ICD-10-CM

## 2023-06-26 NOTE — Progress Notes (Unsigned)
Location:  [facility] Room Number: 138 P Place of Service:  SNF (31)   CODE STATUS: DNR  Allergies  Allergen Reactions   Hydrochlorothiazide     05/29/2021 K+ 2.6 Other reaction(s): Other (See Comments) 05/29/2021 K+ 2.6   Codeine Nausea Only    Chief Complaint  Patient presents with   Acute Visit    Weight loss     HPI:  Her weight in March of this year was 135 pounds; her current weight is 125 pounds. She has had a a hospitalization in May due to GI bleed. She has been treated for covid in August. Her family does bring in food for her. She is taking ensure as directed. Her appetite is starting to improve. Her weight loss is not unexpected due to her recent illnesses. She will need to be monitored for her weight to determine if further interventions will be needed.   Past Medical History:  Diagnosis Date   Decreased sense of smell    and taste-negative CT scan-2011   Depression    Essential hypertension    GERD (gastroesophageal reflux disease)    Heart murmur    heard first time 4/12   History of mammogram    2010 and she does not want to repeat them anymore   HOCM (hypertrophic obstructive cardiomyopathy) (HCC)    a. Dx by echo 05/2014.   Hypothyroidism    IBS (irritable bowel syndrome)    Impaired fasting glucose    Low back pain    Lumbar radiculopathy    with negative MRI (1991)   Postmenopausal    with osteopenia, bisphosphonates 1/05-1/11, improved osteopenia 4/12-repeat planned late 2015   Shingles    2015   Thyroid nodule    Vitamin D deficiency    repleted on weekly vitamin D 40981 iu    Past Surgical History:  Procedure Laterality Date   ABDOMINAL HYSTERECTOMY     BREAST BIOPSY     x2   CATARACT EXTRACTION     removal with IOL bilateral   GLAUCOMA SURGERY     laser   skin cancer removal     THYROIDECTOMY     subtotal    Social History   Socioeconomic History   Marital status: Married    Spouse name: Not on file    Number of children: Not on file   Years of education: Not on file   Highest education level: Not on file  Occupational History   Not on file  Tobacco Use   Smoking status: Never   Smokeless tobacco: Never  Vaping Use   Vaping status: Never Used  Substance and Sexual Activity   Alcohol use: No    Alcohol/week: 0.0 standard drinks of alcohol   Drug use: Never   Sexual activity: Not on file  Other Topics Concern   Not on file  Social History Narrative   Not on file   Social Determinants of Health   Financial Resource Strain: Not on file  Food Insecurity: No Food Insecurity (03/14/2023)   Hunger Vital Sign    Worried About Running Out of Food in the Last Year: Never true    Ran Out of Food in the Last Year: Never true  Transportation Needs: No Transportation Needs (03/14/2023)   PRAPARE - Administrator, Civil Service (Medical): No    Lack of Transportation (Non-Medical): No  Physical Activity: Not on file  Stress: Not on file  Social Connections: Not  on file  Intimate Partner Violence: Not At Risk (03/14/2023)   Humiliation, Afraid, Rape, and Kick questionnaire    Fear of Current or Ex-Partner: No    Emotionally Abused: No    Physically Abused: No    Sexually Abused: No   Family History  Problem Relation Age of Onset   Breast cancer Mother    Hypertension Father    CVA Father    Heart failure Sister    CAD Sister    Kidney disease Sister    COPD Brother    Lung cancer Brother    CAD Brother    Diabetes Brother    Stroke Father    Heart attack Neg Hx       VITAL SIGNS BP (!) 141/59 Comment: Collected by [facility] Nursing Staff  Pulse 73   Temp 97.7 F (36.5 C)   Resp 20   Ht 5\' 2"  (1.575 m)   Wt 125 lb (56.7 kg)   BMI 22.86 kg/m   Outpatient Encounter Medications as of 06/26/2023  Medication Sig   acetaminophen (TYLENOL) 325 MG tablet Take 650 mg by mouth every 6 (six) hours.   amantadine (SYMMETREL) 50 MG/5ML solution Take 50 mg by mouth 2 (two)  times daily.   Cholecalciferol (VITAMIN D-3) 25 MCG (1000 UT) CAPS Take 1 capsule by mouth daily.   cyanocobalamin (VITAMIN B12) 1000 MCG tablet Take 2,000 mcg by mouth daily.   donepezil (ARICEPT) 5 MG tablet Take 5 mg by mouth at bedtime.   feeding supplement (ENSURE ENLIVE / ENSURE PLUS) LIQD Take 237 mLs by mouth 2 (two) times daily between meals.   levothyroxine (SYNTHROID) 50 MCG tablet Take by mouth daily before breakfast.   magnesium hydroxide (MILK OF MAGNESIA) 400 MG/5ML suspension Take 30 mLs by mouth daily as needed for mild constipation.   memantine (NAMENDA XR) 28 MG CP24 24 hr capsule Take 28 mg by mouth daily.   Menthol, Topical Analgesic, (BIOFREEZE) 4 % GEL Apply 1 application  topically 3 (three) times daily as needed.   omeprazole (PRILOSEC) 20 MG capsule Take 20 mg by mouth 2 (two) times daily.   oxymetazoline (AFRIN) 0.05 % nasal spray Place 1 spray into both nostrils daily as needed for congestion.   Phenylephrine HCl (PREPARATION H RE) Place 1 suppository rectally in the morning and at bedtime.   polyethylene glycol (MIRALAX / GLYCOLAX) 17 g packet Take 17 g by mouth daily.   sertraline (ZOLOFT) 50 MG tablet Take 50 mg by mouth daily.   sodium chloride (OCEAN) 0.65 % SOLN nasal spray Place 1 spray into both nostrils 3 (three) times daily. And as needed   [DISCONTINUED] ascorbic acid (VITAMIN C) 500 MG tablet Take 500 mg by mouth daily.   No facility-administered encounter medications on file as of 06/26/2023.     SIGNIFICANT DIAGNOSTIC EXAMS  PREVIOUS   08-09-22: chest x-ray: Mild prominent interstitial densities bilateral compatible with pneumonitis. Mild cardiomegaly; mild osteopenia; mild osteoarthritis.    03-14-23: ct of abdomen and pelvis:  1. No acute abnormality in the abdomen or pelvis. 2. Colonic diverticulosis without acute diverticulitis. Mildly distended rectum containing stool. 3. Diffuse peripheral reticulations and bilateral lower lobe traction  bronchiectasis with scattered subsegmental mucous plugging, suggestive of chronic interstitial lung disease. 4. Irregular left lower lobe lung nodule measures 6 mm. Non-contrast chest CT at 6-12 months is recommended. If the nodule is stable at time of repeat CT, then future CT at 18-24 months (from today's scan) is considered  optional for low-risk patients, but is recommended for high-risk patients.  5. Aortic Atherosclerosis. Coronary artery calcifications.   LABS REVIEWED PREVIOUS     08-09-22: wbc 9.7; hgb 14.3; hct 43.2; mcv 89.6 plt 188; glucose 133; bun 26; creat 0.98; k+ 3.5; na++ 138; ca 8.7; gfr 55; ddimer: 0.95 crp 3.8 08-13-22: TSH 0.179; vitamin D 35.44  09-13-22: tsh 2.261 free t4: 0.95 11-20-22: urine culture: klebsiella pneumoniae  01-31-23: wbc 8.6; hgb 13.4; hct 40.9; mcv 92.7 plt 208; glucose 95; bun 20; creat 0.87; k+ 3.3; na++ 138; ca 8.7; gfr >60; protein 6.1; albumin 3.4 hgb A1c 5.6; tsh 2.505; vitamin D 37.78; vitamin B 12: 207 vitamin B 6: 4.4 03-14-23: wbc 16.7; hgb 9.5; hct 28.1; mcv 96.6 plt 214; glucose 154; bun 34; creat 1.36; k+ 5.7; na++ 138; ca 9.1 gfr 37; protein 6.0 albumin 3.4 03-14-23 (#2) wbc 16.1; hgb 8.5; hct 26.3; mcv 96.0 plt  205; glucose 140; bun 38; creat 1.35; k+ 4.9; na++ 138; ca 8.9; gfr 38; protein 5.8; albumin 3.2 FOB +; blood culture: no growth 03-15-23: wbc 10.9; hgb 12.2; hct 36.9; mcv 92.7 plt 149; glucose 101; bun 25; creat 1.03 ;k+ 3.9; na++ 139; ca 8.4 gfr 52 03-17-23: wbc 8.5; hgb 11.4; hct 34.5; mcv 93.5 plt 151; glucose 99; bun 18;creat 0.89; k+ 3.6; na++ 138; ca 8.2 gfr >60; mag 2.3  03-25-23: wbc 8.4; hgb 11.8; hct 37.1 mcv 95.9 plt 245  04-29-23: vitamin B 12: 491 05-13-23: urine culture: proteus mirabilis 06-06-23: wbc 9.9; hgb 13.8; hct 41.9; mcv 90.5 plt 206; glucose 97; bun 18; creat 1.08; k+ 3.4; na++ 136; ca 8.9 gfr 49; d-dimer 0.56  CRP 4.9  NO NEW LABS.   Review of Systems  Constitutional:  Negative for malaise/fatigue.  Respiratory:   Negative for cough and shortness of breath.   Cardiovascular:  Negative for chest pain, palpitations and leg swelling.  Gastrointestinal:  Negative for abdominal pain, constipation and heartburn.  Musculoskeletal:  Negative for back pain, joint pain and myalgias.  Skin: Negative.   Neurological:  Negative for dizziness.  Psychiatric/Behavioral:  The patient is not nervous/anxious.     Physical Exam Constitutional:      General: She is not in acute distress.    Appearance: She is well-developed. She is not diaphoretic.  Neck:     Thyroid: No thyromegaly.  Cardiovascular:     Rate and Rhythm: Normal rate and regular rhythm.     Pulses: Normal pulses.     Heart sounds: Normal heart sounds.  Pulmonary:     Effort: Pulmonary effort is normal. No respiratory distress.     Breath sounds: Normal breath sounds.  Abdominal:     General: Bowel sounds are normal. There is no distension.     Palpations: Abdomen is soft.     Tenderness: There is no abdominal tenderness.  Musculoskeletal:     Cervical back: Neck supple.     Right lower leg: No edema.     Left lower leg: No edema.     Comments: Left hemiplegia    Lymphadenopathy:     Cervical: No cervical adenopathy.  Skin:    General: Skin is warm and dry.  Neurological:     Mental Status: She is alert. Mental status is at baseline.  Psychiatric:        Mood and Affect: Mood normal.       ASSESSMENT/ PLAN:  TODAY  Weight loss History of stroke At this time will not  make further changes; will continue to monitor her status. If she continues to lose weight will make further medication changes.    Synthia Innocent NP [facility]   call [phone]

## 2023-06-27 DIAGNOSIS — R634 Abnormal weight loss: Secondary | ICD-10-CM | POA: Insufficient documentation

## 2023-07-12 ENCOUNTER — Encounter: Payer: Self-pay | Admitting: Adult Health

## 2023-07-12 ENCOUNTER — Non-Acute Institutional Stay (SKILLED_NURSING_FACILITY): Payer: Medicare Other | Admitting: Adult Health

## 2023-07-12 DIAGNOSIS — G319 Degenerative disease of nervous system, unspecified: Secondary | ICD-10-CM

## 2023-07-12 DIAGNOSIS — J849 Interstitial pulmonary disease, unspecified: Secondary | ICD-10-CM | POA: Diagnosis not present

## 2023-07-12 DIAGNOSIS — I421 Obstructive hypertrophic cardiomyopathy: Secondary | ICD-10-CM | POA: Diagnosis not present

## 2023-07-12 NOTE — Progress Notes (Signed)
Location:  [facility] Room Number: HA/138/P Place of Service:  SNF (31)   CODE STATUS: dnr   Allergies  Allergen Reactions   Hydrochlorothiazide     05/29/2021 K+ 2.6 Other reaction(s): Other (See Comments) 05/29/2021 K+ 2.6   Codeine Nausea Only    Chief Complaint  Patient presents with   Acute Visit    Care Plan Meeting    HPI:  We have come together for her care plan meeting. BIMS 5/15 BCAT 12/50 mood 1/30: nervous at times. She uses wheelchair without falls. She requires moderate to dependent assist with her adls. She is frequently incontinent of bladder and bowel. Dietary: regular diet requires setup for meals; weight 125.4 pounds. Therapy: ST: breathing and cognition. Activities: does attend. She continues to be followed for her chronic illnesses including:  HOCM (hypertrophic obstructive cardiomyopathy)  Interstitial lung disease    Cerebral atrophy  Past Medical History:  Diagnosis Date   Decreased sense of smell    and taste-negative CT scan-2011   Depression    Essential hypertension    GERD (gastroesophageal reflux disease)    Heart murmur    heard first time 4/12   History of mammogram    2010 and she does not want to repeat them anymore   HOCM (hypertrophic obstructive cardiomyopathy) (HCC)    a. Dx by echo 05/2014.   Hypothyroidism    IBS (irritable bowel syndrome)    Impaired fasting glucose    Low back pain    Lumbar radiculopathy    with negative MRI (1991)   Postmenopausal    with osteopenia, bisphosphonates 1/05-1/11, improved osteopenia 4/12-repeat planned late 2015   Shingles    2015   Thyroid nodule    Vitamin D deficiency    repleted on weekly vitamin D 78295 iu    Past Surgical History:  Procedure Laterality Date   ABDOMINAL HYSTERECTOMY     BREAST BIOPSY     x2   CATARACT EXTRACTION     removal with IOL bilateral   GLAUCOMA SURGERY     laser   skin cancer removal     THYROIDECTOMY     subtotal    Social  History   Socioeconomic History   Marital status: Married    Spouse name: Not on file   Number of children: Not on file   Years of education: Not on file   Highest education level: Not on file  Occupational History   Not on file  Tobacco Use   Smoking status: Never   Smokeless tobacco: Never  Vaping Use   Vaping status: Never Used  Substance and Sexual Activity   Alcohol use: No    Alcohol/week: 0.0 standard drinks of alcohol   Drug use: Never   Sexual activity: Not on file  Other Topics Concern   Not on file  Social History Narrative   Not on file   Social Determinants of Health   Financial Resource Strain: Not on file  Food Insecurity: No Food Insecurity (03/14/2023)   Hunger Vital Sign    Worried About Running Out of Food in the Last Year: Never true    Ran Out of Food in the Last Year: Never true  Transportation Needs: No Transportation Needs (03/14/2023)   PRAPARE - Administrator, Civil Service (Medical): No    Lack of Transportation (Non-Medical): No  Physical Activity: Not on file  Stress: Not on file  Social Connections: Not on file  Intimate Partner Violence: Not At Risk (03/14/2023)   Humiliation, Afraid, Rape, and Kick questionnaire    Fear of Current or Ex-Partner: No    Emotionally Abused: No    Physically Abused: No    Sexually Abused: No   Family History  Problem Relation Age of Onset   Breast cancer Mother    Hypertension Father    CVA Father    Heart failure Sister    CAD Sister    Kidney disease Sister    COPD Brother    Lung cancer Brother    CAD Brother    Diabetes Brother    Stroke Father    Heart attack Neg Hx       VITAL SIGNS BP 137/80   Pulse 86   Temp 97.8 F (36.6 C)   Resp 20   Ht 5\' 2"  (1.575 m)   Wt 125 lb 6.4 oz (56.9 kg)   SpO2 96%   BMI 22.94 kg/m   Outpatient Encounter Medications as of 07/12/2023  Medication Sig   acetaminophen (TYLENOL) 325 MG tablet Take 650 mg by mouth every 6 (six) hours.    amantadine (SYMMETREL) 50 MG/5ML solution Take 50 mg by mouth 2 (two) times daily.   Cholecalciferol (VITAMIN D-3) 25 MCG (1000 UT) CAPS Take 1 capsule by mouth daily.   cyanocobalamin (VITAMIN B12) 1000 MCG tablet Take 2,000 mcg by mouth daily.   donepezil (ARICEPT) 5 MG tablet Take 5 mg by mouth at bedtime.   feeding supplement (ENSURE ENLIVE / ENSURE PLUS) LIQD Take 237 mLs by mouth 2 (two) times daily between meals.   levothyroxine (SYNTHROID) 50 MCG tablet Take by mouth daily before breakfast.   magnesium hydroxide (MILK OF MAGNESIA) 400 MG/5ML suspension Take 30 mLs by mouth daily as needed for mild constipation.   memantine (NAMENDA XR) 28 MG CP24 24 hr capsule Take 28 mg by mouth daily.   Menthol, Topical Analgesic, (BIOFREEZE) 4 % GEL Apply 1 application  topically 3 (three) times daily as needed.   omeprazole (PRILOSEC) 20 MG capsule Take 20 mg by mouth 2 (two) times daily.   oxymetazoline (AFRIN) 0.05 % nasal spray Place 1 spray into both nostrils daily as needed for congestion.   Phenylephrine HCl (PREPARATION H RE) Place 1 suppository rectally in the morning and at bedtime.   polyethylene glycol (MIRALAX / GLYCOLAX) 17 g packet Take 17 g by mouth daily.   sennosides-docusate sodium (SENOKOT-S) 8.6-50 MG tablet Take 1 tablet by mouth daily.   sertraline (ZOLOFT) 50 MG tablet Take 50 mg by mouth daily.   sodium chloride (OCEAN) 0.65 % SOLN nasal spray Place 1 spray into both nostrils 3 (three) times daily. And as needed   No facility-administered encounter medications on file as of 07/12/2023.     SIGNIFICANT DIAGNOSTIC EXAMS  PREVIOUS   08-09-22: chest x-ray: Mild prominent interstitial densities bilateral compatible with pneumonitis. Mild cardiomegaly; mild osteopenia; mild osteoarthritis.    03-14-23: ct of abdomen and pelvis:  1. No acute abnormality in the abdomen or pelvis. 2. Colonic diverticulosis without acute diverticulitis. Mildly distended rectum containing  stool. 3. Diffuse peripheral reticulations and bilateral lower lobe traction bronchiectasis with scattered subsegmental mucous plugging, suggestive of chronic interstitial lung disease. 4. Irregular left lower lobe lung nodule measures 6 mm. Non-contrast chest CT at 6-12 months is recommended. If the nodule is stable at time of repeat CT, then future CT at 18-24 months (from today's scan) is considered optional for low-risk patients, but  is recommended for high-risk patients.  5. Aortic Atherosclerosis. Coronary artery calcifications.   LABS REVIEWED PREVIOUS     08-09-22: wbc 9.7; hgb 14.3; hct 43.2; mcv 89.6 plt 188; glucose 133; bun 26; creat 0.98; k+ 3.5; na++ 138; ca 8.7; gfr 55; ddimer: 0.95 crp 3.8 08-13-22: TSH 0.179; vitamin D 35.44  09-13-22: tsh 2.261 free t4: 0.95 11-20-22: urine culture: klebsiella pneumoniae  01-31-23: wbc 8.6; hgb 13.4; hct 40.9; mcv 92.7 plt 208; glucose 95; bun 20; creat 0.87; k+ 3.3; na++ 138; ca 8.7; gfr >60; protein 6.1; albumin 3.4 hgb A1c 5.6; tsh 2.505; vitamin D 37.78; vitamin B 12: 207 vitamin B 6: 4.4 03-14-23: wbc 16.7; hgb 9.5; hct 28.1; mcv 96.6 plt 214; glucose 154; bun 34; creat 1.36; k+ 5.7; na++ 138; ca 9.1 gfr 37; protein 6.0 albumin 3.4 03-14-23 (#2) wbc 16.1; hgb 8.5; hct 26.3; mcv 96.0 plt  205; glucose 140; bun 38; creat 1.35; k+ 4.9; na++ 138; ca 8.9; gfr 38; protein 5.8; albumin 3.2 FOB +; blood culture: no growth 03-15-23: wbc 10.9; hgb 12.2; hct 36.9; mcv 92.7 plt 149; glucose 101; bun 25; creat 1.03 ;k+ 3.9; na++ 139; ca 8.4 gfr 52 03-17-23: wbc 8.5; hgb 11.4; hct 34.5; mcv 93.5 plt 151; glucose 99; bun 18;creat 0.89; k+ 3.6; na++ 138; ca 8.2 gfr >60; mag 2.3  03-25-23: wbc 8.4; hgb 11.8; hct 37.1 mcv 95.9 plt 245  04-29-23: vitamin B 12: 491 05-13-23: urine culture: proteus mirabilis 06-06-23: wbc 9.9; hgb 13.8; hct 41.9; mcv 90.5 plt 206; glucose 97; bun 18; creat 1.08; k+ 3.4; na++ 136; ca 8.9 gfr 49; d-dimer 0.56  CRP 4.9  NO NEW LABS.    Review of Systems  Constitutional:  Negative for malaise/fatigue.  Respiratory:  Negative for cough and shortness of breath.   Cardiovascular:  Negative for chest pain, palpitations and leg swelling.  Gastrointestinal:  Negative for abdominal pain, constipation and heartburn.  Musculoskeletal:  Negative for back pain, joint pain and myalgias.  Skin: Negative.   Neurological:  Negative for dizziness.  Psychiatric/Behavioral:  The patient is not nervous/anxious.     Physical Exam Constitutional:      General: She is not in acute distress.    Appearance: She is well-developed. She is not diaphoretic.  Neck:     Thyroid: No thyromegaly.  Cardiovascular:     Rate and Rhythm: Normal rate and regular rhythm.     Pulses: Normal pulses.     Heart sounds: Normal heart sounds.  Pulmonary:     Effort: Pulmonary effort is normal. No respiratory distress.     Breath sounds: Normal breath sounds.  Abdominal:     General: Bowel sounds are normal. There is no distension.     Palpations: Abdomen is soft.     Tenderness: There is no abdominal tenderness.  Musculoskeletal:     Cervical back: Neck supple.     Right lower leg: No edema.     Left lower leg: No edema.     Comments: Left hemiplegia   Lymphadenopathy:     Cervical: No cervical adenopathy.  Skin:    General: Skin is warm and dry.  Neurological:     Mental Status: She is alert. Mental status is at baseline.  Psychiatric:        Mood and Affect: Mood normal.       ASSESSMENT/ PLAN:  TODAY  HOCM (hypertrophic obstructive cardiomyopathy) Interstitial lung disease  Cerebral atrophy  Will continue current medications Will  continue current plan of care Will continue to monitor her status.   Time spent with patient: 40 minutes: therapy; medications; dietary     Synthia Innocent NP [facility]   call [phone]

## 2023-07-18 ENCOUNTER — Non-Acute Institutional Stay (SKILLED_NURSING_FACILITY): Payer: Medicare Other | Admitting: Adult Health

## 2023-07-18 ENCOUNTER — Encounter: Payer: Self-pay | Admitting: Adult Health

## 2023-07-18 DIAGNOSIS — R400 Somnolence: Secondary | ICD-10-CM

## 2023-07-18 DIAGNOSIS — E8809 Other disorders of plasma-protein metabolism, not elsewhere classified: Secondary | ICD-10-CM | POA: Diagnosis not present

## 2023-07-18 DIAGNOSIS — E559 Vitamin D deficiency, unspecified: Secondary | ICD-10-CM

## 2023-07-18 DIAGNOSIS — E538 Deficiency of other specified B group vitamins: Secondary | ICD-10-CM

## 2023-07-18 DIAGNOSIS — E46 Unspecified protein-calorie malnutrition: Secondary | ICD-10-CM

## 2023-07-18 NOTE — Progress Notes (Signed)
Location:  [facility] Room Number: [id number] Place of Service:  SNF (31)   CODE STATUS: dnr   Allergies  Allergen Reactions   Hydrochlorothiazide     05/29/2021 K+ 2.6 Other reaction(s): Other (See Comments) 05/29/2021 K+ 2.6   Codeine Nausea Only    Chief Complaint  Patient presents with   Medical Management of Chronic Issues         Somnolence post stroke  Hypoalbuminemia due to protein calorie malnutrition:  Vitamin B 12 deficiency: Vitamin D deficiency     HPI:  She is a 89 year old long term resident of this facility being seen for the management of her chronic illnesses: Somnolence post stroke  Hypoalbuminemia due to protein calorie malnutrition:  Vitamin B 12 deficiency: Vitamin D deficiency. There are no reports of uncontrolled pain. Her weight is stable. There are no reports of anxiety or agitation.   Past Medical History:  Diagnosis Date   Decreased sense of smell    and taste-negative CT scan-2011   Depression    Essential hypertension    GERD (gastroesophageal reflux disease)    Heart murmur    heard first time 4/12   History of mammogram    2010 and she does not want to repeat them anymore   HOCM (hypertrophic obstructive cardiomyopathy) (HCC)    a. Dx by echo 05/2014.   Hypothyroidism    IBS (irritable bowel syndrome)    Impaired fasting glucose    Low back pain    Lumbar radiculopathy    with negative MRI (1991)   Postmenopausal    with osteopenia, bisphosphonates 1/05-1/11, improved osteopenia 4/12-repeat planned late 2015   Shingles    2015   Thyroid nodule    Vitamin D deficiency    repleted on weekly vitamin D 78469 iu    Past Surgical History:  Procedure Laterality Date   ABDOMINAL HYSTERECTOMY     BREAST BIOPSY     x2   CATARACT EXTRACTION     removal with IOL bilateral   GLAUCOMA SURGERY     laser   skin cancer removal     THYROIDECTOMY     subtotal    Social History   Socioeconomic History   Marital status:  Married    Spouse name: Not on file   Number of children: Not on file   Years of education: Not on file   Highest education level: Not on file  Occupational History   Not on file  Tobacco Use   Smoking status: Never   Smokeless tobacco: Never  Vaping Use   Vaping status: Never Used  Substance and Sexual Activity   Alcohol use: No    Alcohol/week: 0.0 standard drinks of alcohol   Drug use: Never   Sexual activity: Not on file  Other Topics Concern   Not on file  Social History Narrative   Not on file   Social Determinants of Health   Financial Resource Strain: Not on file  Food Insecurity: No Food Insecurity (03/14/2023)   Hunger Vital Sign    Worried About Running Out of Food in the Last Year: Never true    Ran Out of Food in the Last Year: Never true  Transportation Needs: No Transportation Needs (03/14/2023)   PRAPARE - Administrator, Civil Service (Medical): No    Lack of Transportation (Non-Medical): No  Physical Activity: Not on file  Stress: Not on file  Social Connections: Not on  file  Intimate Partner Violence: Not At Risk (03/14/2023)   Humiliation, Afraid, Rape, and Kick questionnaire    Fear of Current or Ex-Partner: No    Emotionally Abused: No    Physically Abused: No    Sexually Abused: No   Family History  Problem Relation Age of Onset   Breast cancer Mother    Hypertension Father    CVA Father    Heart failure Sister    CAD Sister    Kidney disease Sister    COPD Brother    Lung cancer Brother    CAD Brother    Diabetes Brother    Stroke Father    Heart attack Neg Hx       VITAL SIGNS BP 137/80   Pulse 85   Temp 97.6 F (36.4 C)   Resp 20   Ht 5\' 2"  (1.575 m)   Wt 125 lb 6.4 oz (56.9 kg)   SpO2 97%   BMI 22.94 kg/m   Outpatient Encounter Medications as of 07/18/2023  Medication Sig   acetaminophen (TYLENOL) 325 MG tablet Take 650 mg by mouth every 6 (six) hours.   amantadine (SYMMETREL) 50 MG/5ML solution Take 50 mg  by mouth 2 (two) times daily.   Cholecalciferol (VITAMIN D-3) 25 MCG (1000 UT) CAPS Take 1 capsule by mouth daily.   cyanocobalamin (VITAMIN B12) 1000 MCG tablet Take 2,000 mcg by mouth daily.   donepezil (ARICEPT) 5 MG tablet Take 5 mg by mouth at bedtime.   feeding supplement (ENSURE ENLIVE / ENSURE PLUS) LIQD Take 237 mLs by mouth 2 (two) times daily between meals.   levothyroxine (SYNTHROID) 50 MCG tablet Take by mouth daily before breakfast.   magnesium hydroxide (MILK OF MAGNESIA) 400 MG/5ML suspension Take 30 mLs by mouth daily as needed for mild constipation.   memantine (NAMENDA XR) 28 MG CP24 24 hr capsule Take 28 mg by mouth daily.   Menthol, Topical Analgesic, (BIOFREEZE) 4 % GEL Apply 1 application  topically 3 (three) times daily as needed.   omeprazole (PRILOSEC) 20 MG capsule Take 20 mg by mouth 2 (two) times daily.   oxymetazoline (AFRIN) 0.05 % nasal spray Place 1 spray into both nostrils daily as needed for congestion.   Phenylephrine HCl (PREPARATION H RE) Place 1 suppository rectally in the morning and at bedtime.   polyethylene glycol (MIRALAX / GLYCOLAX) 17 g packet Take 17 g by mouth daily.   sennosides-docusate sodium (SENOKOT-S) 8.6-50 MG tablet Take 1 tablet by mouth daily.   sertraline (ZOLOFT) 50 MG tablet Take 50 mg by mouth daily.   sodium chloride (OCEAN) 0.65 % SOLN nasal spray Place 1 spray into both nostrils 3 (three) times daily. And as needed   No facility-administered encounter medications on file as of 07/18/2023.     SIGNIFICANT DIAGNOSTIC EXAMS  PREVIOUS   08-09-22: chest x-ray: Mild prominent interstitial densities bilateral compatible with pneumonitis. Mild cardiomegaly; mild osteopenia; mild osteoarthritis.    03-14-23: ct of abdomen and pelvis:  1. No acute abnormality in the abdomen or pelvis. 2. Colonic diverticulosis without acute diverticulitis. Mildly distended rectum containing stool. 3. Diffuse peripheral reticulations and bilateral  lower lobe traction bronchiectasis with scattered subsegmental mucous plugging, suggestive of chronic interstitial lung disease. 4. Irregular left lower lobe lung nodule measures 6 mm. Non-contrast chest CT at 6-12 months is recommended. If the nodule is stable at time of repeat CT, then future CT at 18-24 months (from today's scan) is considered optional for low-risk  patients, but is recommended for high-risk patients.  5. Aortic Atherosclerosis. Coronary artery calcifications.   LABS REVIEWED PREVIOUS     08-09-22: wbc 9.7; hgb 14.3; hct 43.2; mcv 89.6 plt 188; glucose 133; bun 26; creat 0.98; k+ 3.5; na++ 138; ca 8.7; gfr 55; ddimer: 0.95 crp 3.8 08-13-22: TSH 0.179; vitamin D 35.44  09-13-22: tsh 2.261 free t4: 0.95 11-20-22: urine culture: klebsiella pneumoniae  01-31-23: wbc 8.6; hgb 13.4; hct 40.9; mcv 92.7 plt 208; glucose 95; bun 20; creat 0.87; k+ 3.3; na++ 138; ca 8.7; gfr >60; protein 6.1; albumin 3.4 hgb A1c 5.6; tsh 2.505; vitamin D 37.78; vitamin B 12: 207 vitamin B 6: 4.4 03-14-23: wbc 16.7; hgb 9.5; hct 28.1; mcv 96.6 plt 214; glucose 154; bun 34; creat 1.36; k+ 5.7; na++ 138; ca 9.1 gfr 37; protein 6.0 albumin 3.4 03-14-23 (#2) wbc 16.1; hgb 8.5; hct 26.3; mcv 96.0 plt  205; glucose 140; bun 38; creat 1.35; k+ 4.9; na++ 138; ca 8.9; gfr 38; protein 5.8; albumin 3.2 FOB +; blood culture: no growth 03-15-23: wbc 10.9; hgb 12.2; hct 36.9; mcv 92.7 plt 149; glucose 101; bun 25; creat 1.03 ;k+ 3.9; na++ 139; ca 8.4 gfr 52 03-17-23: wbc 8.5; hgb 11.4; hct 34.5; mcv 93.5 plt 151; glucose 99; bun 18;creat 0.89; k+ 3.6; na++ 138; ca 8.2 gfr >60; mag 2.3  03-25-23: wbc 8.4; hgb 11.8; hct 37.1 mcv 95.9 plt 245  04-29-23: vitamin B 12: 491 05-13-23: urine culture: proteus mirabilis 06-06-23: wbc 9.9; hgb 13.8; hct 41.9; mcv 90.5 plt 206; glucose 97; bun 18; creat 1.08; k+ 3.4; na++ 136; ca 8.9 gfr 49; d-dimer 0.56  CRP 4.9  NO NEW LABS.   Review of Systems  Constitutional:  Negative for  malaise/fatigue.  Respiratory:  Negative for cough and shortness of breath.   Cardiovascular:  Negative for chest pain, palpitations and leg swelling.  Gastrointestinal:  Negative for abdominal pain, constipation and heartburn.  Musculoskeletal:  Negative for back pain, joint pain and myalgias.  Skin: Negative.   Neurological:  Negative for dizziness.  Psychiatric/Behavioral:  The patient is not nervous/anxious.     Physical Exam Constitutional:      General: She is not in acute distress.    Appearance: She is well-developed. She is not diaphoretic.  Neck:     Thyroid: No thyromegaly.  Cardiovascular:     Rate and Rhythm: Normal rate and regular rhythm.     Pulses: Normal pulses.     Heart sounds: Normal heart sounds.  Pulmonary:     Effort: Pulmonary effort is normal. No respiratory distress.     Breath sounds: Normal breath sounds.  Abdominal:     General: Bowel sounds are normal. There is no distension.     Palpations: Abdomen is soft.     Tenderness: There is no abdominal tenderness.  Musculoskeletal:     Cervical back: Neck supple.     Right lower leg: No edema.     Left lower leg: No edema.     Comments: Left hemiplegia  Lymphadenopathy:     Cervical: No cervical adenopathy.  Skin:    General: Skin is warm and dry.  Neurological:     Mental Status: She is alert. Mental status is at baseline.     Comments: 07-03-23: BCAT 12/50  Psychiatric:        Mood and Affect: Mood normal.       ASSESSMENT/ PLAN:  TODAY  Somnolence post stroke will lower amantadine to  50 mg daily and will monitor   2. Hypoalbuminemia due to protein calorie malnutrition: albumin 3.4 will continue supplements as directed  3. Vitamin B 12 deficiency: level 491 will continue 2000 mcg daily   4. Vitamin D deficiency: level 37.78; will continue 1000 units daily   PREVIOUS   5. Postoperative hypothyroidism: tsh 2.505 will continue synthroid 50 mcg daily   6.  Mixed hyperlipidemia: is off  statin due to advanced age  25. Psychosis in elderly without behavioral disturbance: is off seroquel  8. Depression, recurrent: will continue zoloft 50 mg daily   9. GERD without esophagitis: will continue protonix 40 mg daily   10. Chronic constipation: will continue miralax daily  11. Thoracic aortic atherosclerosis (cxr 12-15-14) not on statin due to advanced age  42. History of CVA/left hemiplegia: is off asa due to GI bleed  13. Chronic generalized pain: will continue tylenol 650 mg every 6 hours  14. Dysphagia: no indications of aspiration present: is on thin liquids  15. Stage 3a chronic kidney disease bun 18; creat 0.89 gfr >60   16. Gastrointestinal hemorrhage associated with diverticulosis: did receive packed cells; hgb is presently stable the bleeding felt more than likely from diverticulosis.   17. Essential hypertension: b/p 137/80 will monitor   18. Interstitial lung disease: is stable  19. HOCM (hypertrophic obstructive cardiomyopathy) will monitor   20. Vascular dementia without behavioral disturbance/neurocognitive deficits/cerebral atrophy: weight is 125 pounds; will continue aricept 5 mg daily and namenda xr 28 mg daily      Synthia Innocent NP [facility]  call [phone]

## 2023-07-25 ENCOUNTER — Encounter: Payer: Self-pay | Admitting: Adult Health

## 2023-07-25 ENCOUNTER — Non-Acute Institutional Stay (SKILLED_NURSING_FACILITY): Payer: Medicare Other | Admitting: Adult Health

## 2023-07-25 DIAGNOSIS — R634 Abnormal weight loss: Secondary | ICD-10-CM

## 2023-07-25 DIAGNOSIS — F0150 Vascular dementia without behavioral disturbance: Secondary | ICD-10-CM | POA: Diagnosis not present

## 2023-07-25 DIAGNOSIS — Z8673 Personal history of transient ischemic attack (TIA), and cerebral infarction without residual deficits: Secondary | ICD-10-CM | POA: Diagnosis not present

## 2023-07-25 NOTE — Progress Notes (Signed)
Location:  [facility] Room Number: 138P Place of Service:  SNF (31)   CODE STATUS: DNR  Allergies  Allergen Reactions   Hydrochlorothiazide     05/29/2021 K+ 2.6 Other reaction(s): Other (See Comments) 05/29/2021 K+ 2.6   Codeine Nausea Only    Chief Complaint  Patient presents with   Acute Visit    Weight Loss    HPI:  She has had some weight loss: 05-24-23: 128.6 pounds; 06-23-23: 125.4 pounds; 07-23-23: 123.8 pounds. She has had covid in early August. There are no reports of changes in appetite. There are no reports of pain. No reports of depressive thoughts or anxiety.   Past Medical History:  Diagnosis Date   Decreased sense of smell    and taste-negative CT scan-2011   Depression    Essential hypertension    GERD (gastroesophageal reflux disease)    Heart murmur    heard first time 4/12   History of mammogram    2010 and she does not want to repeat them anymore   HOCM (hypertrophic obstructive cardiomyopathy) (HCC)    a. Dx by echo 05/2014.   Hypothyroidism    IBS (irritable bowel syndrome)    Impaired fasting glucose    Low back pain    Lumbar radiculopathy    with negative MRI (1991)   Postmenopausal    with osteopenia, bisphosphonates 1/05-1/11, improved osteopenia 4/12-repeat planned late 2015   Shingles    2015   Thyroid nodule    Vitamin D deficiency    repleted on weekly vitamin D 21308 iu    Past Surgical History:  Procedure Laterality Date   ABDOMINAL HYSTERECTOMY     BREAST BIOPSY     x2   CATARACT EXTRACTION     removal with IOL bilateral   GLAUCOMA SURGERY     laser   skin cancer removal     THYROIDECTOMY     subtotal    Social History   Socioeconomic History   Marital status: Married    Spouse name: Not on file   Number of children: Not on file   Years of education: Not on file   Highest education level: Not on file  Occupational History   Not on file  Tobacco Use   Smoking status: Never   Smokeless  tobacco: Never  Vaping Use   Vaping status: Never Used  Substance and Sexual Activity   Alcohol use: No    Alcohol/week: 0.0 standard drinks of alcohol   Drug use: Never   Sexual activity: Not on file  Other Topics Concern   Not on file  Social History Narrative   Not on file   Social Determinants of Health   Financial Resource Strain: Not on file  Food Insecurity: No Food Insecurity (03/14/2023)   Hunger Vital Sign    Worried About Running Out of Food in the Last Year: Never true    Ran Out of Food in the Last Year: Never true  Transportation Needs: No Transportation Needs (03/14/2023)   PRAPARE - Administrator, Civil Service (Medical): No    Lack of Transportation (Non-Medical): No  Physical Activity: Not on file  Stress: Not on file  Social Connections: Not on file  Intimate Partner Violence: Not At Risk (03/14/2023)   Humiliation, Afraid, Rape, and Kick questionnaire    Fear of Current or Ex-Partner: No    Emotionally Abused: No    Physically Abused: No    Sexually  Abused: No   Family History  Problem Relation Age of Onset   Breast cancer Mother    Hypertension Father    CVA Father    Heart failure Sister    CAD Sister    Kidney disease Sister    COPD Brother    Lung cancer Brother    CAD Brother    Diabetes Brother    Stroke Father    Heart attack Neg Hx       VITAL SIGNS BP 134/80   Pulse 81   Temp (!) 97.4 F (36.3 C)   Resp 20   Ht 5\' 2"  (1.575 m)   Wt 123 lb 12.8 oz (56.2 kg)   SpO2 96%   BMI 22.64 kg/m   Outpatient Encounter Medications as of 07/25/2023  Medication Sig   acetaminophen (TYLENOL) 325 MG tablet Take 650 mg by mouth every 6 (six) hours.   amantadine (SYMMETREL) 50 MG/5ML solution Take 50 mg by mouth daily.   Cholecalciferol (VITAMIN D-3) 25 MCG (1000 UT) CAPS Take 1 capsule by mouth daily.   cyanocobalamin (VITAMIN B12) 1000 MCG tablet Take 2,000 mcg by mouth daily.   feeding supplement (ENSURE ENLIVE / ENSURE PLUS)  LIQD Take 237 mLs by mouth 2 (two) times daily between meals.   levothyroxine (SYNTHROID) 50 MCG tablet Take 50 mcg by mouth daily before breakfast.   magnesium hydroxide (MILK OF MAGNESIA) 400 MG/5ML suspension Take 30 mLs by mouth daily as needed for mild constipation.   memantine (NAMENDA XR) 28 MG CP24 24 hr capsule Take 28 mg by mouth daily.   Menthol, Topical Analgesic, (BIOFREEZE) 4 % GEL Apply 1 application  topically 3 (three) times daily as needed.   omeprazole (PRILOSEC) 20 MG capsule Take 20 mg by mouth 2 (two) times daily.   oxymetazoline (AFRIN) 0.05 % nasal spray Place 1 spray into both nostrils daily as needed for congestion.   Phenylephrine HCl (PREPARATION H RE) Place 1 suppository rectally in the morning and at bedtime.   polyethylene glycol (MIRALAX / GLYCOLAX) 17 g packet Take 17 g by mouth daily.   sennosides-docusate sodium (SENOKOT-S) 8.6-50 MG tablet Take 1 tablet by mouth daily.   sertraline (ZOLOFT) 50 MG tablet Take 50 mg by mouth daily.   sodium chloride (OCEAN) 0.65 % SOLN nasal spray Place 1 spray into both nostrils 3 (three) times daily. And as needed   [DISCONTINUED] donepezil (ARICEPT) 5 MG tablet Take 5 mg by mouth at bedtime.   No facility-administered encounter medications on file as of 07/25/2023.     SIGNIFICANT DIAGNOSTIC EXAMS  PREVIOUS   08-09-22: chest x-ray: Mild prominent interstitial densities bilateral compatible with pneumonitis. Mild cardiomegaly; mild osteopenia; mild osteoarthritis.    03-14-23: ct of abdomen and pelvis:  1. No acute abnormality in the abdomen or pelvis. 2. Colonic diverticulosis without acute diverticulitis. Mildly distended rectum containing stool. 3. Diffuse peripheral reticulations and bilateral lower lobe traction bronchiectasis with scattered subsegmental mucous plugging, suggestive of chronic interstitial lung disease. 4. Irregular left lower lobe lung nodule measures 6 mm. Non-contrast chest CT at 6-12 months is  recommended. If the nodule is stable at time of repeat CT, then future CT at 18-24 months (from today's scan) is considered optional for low-risk patients, but is recommended for high-risk patients.  5. Aortic Atherosclerosis. Coronary artery calcifications.   LABS REVIEWED PREVIOUS     08-09-22: wbc 9.7; hgb 14.3; hct 43.2; mcv 89.6 plt 188; glucose 133; bun 26; creat 0.98; k+  3.5; na++ 138; ca 8.7; gfr 55; ddimer: 0.95 crp 3.8 08-13-22: TSH 0.179; vitamin D 35.44  09-13-22: tsh 2.261 free t4: 0.95 11-20-22: urine culture: klebsiella pneumoniae  01-31-23: wbc 8.6; hgb 13.4; hct 40.9; mcv 92.7 plt 208; glucose 95; bun 20; creat 0.87; k+ 3.3; na++ 138; ca 8.7; gfr >60; protein 6.1; albumin 3.4 hgb A1c 5.6; tsh 2.505; vitamin D 37.78; vitamin B 12: 207 vitamin B 6: 4.4 03-14-23: wbc 16.7; hgb 9.5; hct 28.1; mcv 96.6 plt 214; glucose 154; bun 34; creat 1.36; k+ 5.7; na++ 138; ca 9.1 gfr 37; protein 6.0 albumin 3.4 03-14-23 (#2) wbc 16.1; hgb 8.5; hct 26.3; mcv 96.0 plt  205; glucose 140; bun 38; creat 1.35; k+ 4.9; na++ 138; ca 8.9; gfr 38; protein 5.8; albumin 3.2 FOB +; blood culture: no growth 03-15-23: wbc 10.9; hgb 12.2; hct 36.9; mcv 92.7 plt 149; glucose 101; bun 25; creat 1.03 ;k+ 3.9; na++ 139; ca 8.4 gfr 52 03-17-23: wbc 8.5; hgb 11.4; hct 34.5; mcv 93.5 plt 151; glucose 99; bun 18;creat 0.89; k+ 3.6; na++ 138; ca 8.2 gfr >60; mag 2.3  03-25-23: wbc 8.4; hgb 11.8; hct 37.1 mcv 95.9 plt 245  04-29-23: vitamin B 12: 491 05-13-23: urine culture: proteus mirabilis 06-06-23: wbc 9.9; hgb 13.8; hct 41.9; mcv 90.5 plt 206; glucose 97; bun 18; creat 1.08; k+ 3.4; na++ 136; ca 8.9 gfr 49; d-dimer 0.56  CRP 4.9  NO NEW LABS.    Review of Systems  Constitutional:  Negative for malaise/fatigue.  Respiratory:  Negative for cough and shortness of breath.   Cardiovascular:  Negative for chest pain, palpitations and leg swelling.  Gastrointestinal:  Negative for abdominal pain, constipation and heartburn.   Musculoskeletal:  Negative for back pain, joint pain and myalgias.  Skin: Negative.   Neurological:  Negative for dizziness.  Psychiatric/Behavioral:  The patient is not nervous/anxious.     Physical Exam Constitutional:      General: She is not in acute distress.    Appearance: She is well-developed. She is not diaphoretic.  Neck:     Thyroid: No thyromegaly.  Cardiovascular:     Rate and Rhythm: Normal rate and regular rhythm.     Pulses: Normal pulses.     Heart sounds: Normal heart sounds.  Pulmonary:     Effort: Pulmonary effort is normal. No respiratory distress.     Breath sounds: Normal breath sounds.  Abdominal:     General: Bowel sounds are normal. There is no distension.     Palpations: Abdomen is soft.     Tenderness: There is no abdominal tenderness.  Musculoskeletal:     Cervical back: Neck supple.     Right lower leg: No edema.     Left lower leg: No edema.     Comments: Left hemiplegia  Lymphadenopathy:     Cervical: No cervical adenopathy.  Skin:    General: Skin is warm and dry.  Neurological:     Mental Status: She is alert. Mental status is at baseline.     Comments: 07-03-23: BCAT 12/50  Psychiatric:        Mood and Affect: Mood normal.       ASSESSMENT/ PLAN:  TODAY  History of stroke Vascular dementia without behavioral issues Weight loss  Will stop aricept and will monitor her status    Synthia Innocent NP [facility]  call [phone]

## 2023-07-29 ENCOUNTER — Other Ambulatory Visit (HOSPITAL_COMMUNITY)
Admission: RE | Admit: 2023-07-29 | Discharge: 2023-07-29 | Disposition: A | Discharge status: Home / Self Care | Payer: Medicare Other | Source: Skilled Nursing Facility | Attending: Adult Health | Admitting: Adult Health

## 2023-07-29 DIAGNOSIS — E890 Postprocedural hypothyroidism: Secondary | ICD-10-CM | POA: Insufficient documentation

## 2023-07-29 DIAGNOSIS — E559 Vitamin D deficiency, unspecified: Secondary | ICD-10-CM | POA: Insufficient documentation

## 2023-07-29 LAB — VITAMIN D 25 HYDROXY (VIT D DEFICIENCY, FRACTURES): Vit D, 25-Hydroxy: 34.06 ng/mL (ref 30–100)

## 2023-07-29 LAB — TSH: TSH: 2.759 u[IU]/mL (ref 0.350–4.500)

## 2023-08-20 ENCOUNTER — Non-Acute Institutional Stay (SKILLED_NURSING_FACILITY): Payer: Medicare Other | Admitting: Adult Health

## 2023-08-20 ENCOUNTER — Encounter: Payer: Self-pay | Admitting: Adult Health

## 2023-08-20 DIAGNOSIS — F0390 Unspecified dementia without behavioral disturbance: Secondary | ICD-10-CM | POA: Diagnosis not present

## 2023-08-20 DIAGNOSIS — E890 Postprocedural hypothyroidism: Secondary | ICD-10-CM

## 2023-08-20 DIAGNOSIS — I10 Essential (primary) hypertension: Secondary | ICD-10-CM | POA: Diagnosis not present

## 2023-08-20 DIAGNOSIS — E782 Mixed hyperlipidemia: Secondary | ICD-10-CM | POA: Diagnosis not present

## 2023-08-20 NOTE — Progress Notes (Unsigned)
Location:  [facility] Room Number: 138P Place of Service:  SNF (31)   CODE STATUS: DNR   Allergies  Allergen Reactions   Hydrochlorothiazide     05/29/2021 K+ 2.6 Other reaction(s): Other (See Comments) 05/29/2021 K+ 2.6   Codeine Nausea Only    Chief Complaint  Patient presents with   Medical Management of Chronic Issues    Routine   Quality Metric Gaps    Medicare Annual Wellness Visit    HPI:    Past Medical History:  Diagnosis Date   Decreased sense of smell    and taste-negative CT scan-2011   Depression    Essential hypertension    GERD (gastroesophageal reflux disease)    Heart murmur    heard first time 4/12   History of mammogram    2010 and she does not want to repeat them anymore   HOCM (hypertrophic obstructive cardiomyopathy) (HCC)    a. Dx by echo 05/2014.   Hypothyroidism    IBS (irritable bowel syndrome)    Impaired fasting glucose    Low back pain    Lumbar radiculopathy    with negative MRI (1991)   Postmenopausal    with osteopenia, bisphosphonates 1/05-1/11, improved osteopenia 4/12-repeat planned late 2015   Shingles    2015   Thyroid nodule    Vitamin D deficiency    repleted on weekly vitamin D 69629 iu    Past Surgical History:  Procedure Laterality Date   ABDOMINAL HYSTERECTOMY     BREAST BIOPSY     x2   CATARACT EXTRACTION     removal with IOL bilateral   GLAUCOMA SURGERY     laser   skin cancer removal     THYROIDECTOMY     subtotal    Social History   Socioeconomic History   Marital status: Married    Spouse name: Not on file   Number of children: Not on file   Years of education: Not on file   Highest education level: Not on file  Occupational History   Not on file  Tobacco Use   Smoking status: Never   Smokeless tobacco: Never  Vaping Use   Vaping status: Never Used  Substance and Sexual Activity   Alcohol use: No    Alcohol/week: 0.0 standard drinks of alcohol   Drug use: Never    Sexual activity: Not on file  Other Topics Concern   Not on file  Social History Narrative   Not on file   Social Determinants of Health   Financial Resource Strain: Not on file  Food Insecurity: No Food Insecurity (03/14/2023)   Hunger Vital Sign    Worried About Running Out of Food in the Last Year: Never true    Ran Out of Food in the Last Year: Never true  Transportation Needs: No Transportation Needs (03/14/2023)   PRAPARE - Administrator, Civil Service (Medical): No    Lack of Transportation (Non-Medical): No  Physical Activity: Not on file  Stress: Not on file  Social Connections: Not on file  Intimate Partner Violence: Not At Risk (03/14/2023)   Humiliation, Afraid, Rape, and Kick questionnaire    Fear of Current or Ex-Partner: No    Emotionally Abused: No    Physically Abused: No    Sexually Abused: No   Family History  Problem Relation Age of Onset   Breast cancer Mother    Hypertension Father    CVA Father  Heart failure Sister    CAD Sister    Kidney disease Sister    COPD Brother    Lung cancer Brother    CAD Brother    Diabetes Brother    Stroke Father    Heart attack Neg Hx       VITAL SIGNS BP (!) 143/84   Pulse 73   Temp 97.9 F (36.6 C)   Resp 20   Ht 5\' 2"  (1.575 m)   Wt 123 lb 12.8 oz (56.2 kg)   SpO2 98%   BMI 22.64 kg/m   Outpatient Encounter Medications as of 08/20/2023  Medication Sig   acetaminophen (TYLENOL) 325 MG tablet Take 650 mg by mouth every 6 (six) hours.   amantadine (SYMMETREL) 50 MG/5ML solution Take 50 mg by mouth daily.   Cholecalciferol (VITAMIN D-3) 25 MCG (1000 UT) CAPS Take 1 capsule by mouth daily.   cyanocobalamin (VITAMIN B12) 1000 MCG tablet Take 2,000 mcg by mouth daily.   feeding supplement (ENSURE ENLIVE / ENSURE PLUS) LIQD Take 237 mLs by mouth 2 (two) times daily between meals.   levothyroxine (SYNTHROID) 50 MCG tablet Take 50 mcg by mouth daily before breakfast.   magnesium hydroxide (MILK  OF MAGNESIA) 400 MG/5ML suspension Take 30 mLs by mouth daily as needed for mild constipation.   memantine (NAMENDA XR) 28 MG CP24 24 hr capsule Take 28 mg by mouth daily.   Menthol, Topical Analgesic, (BIOFREEZE) 4 % GEL Apply 1 application  topically 3 (three) times daily as needed.   omeprazole (PRILOSEC) 20 MG capsule Take 20 mg by mouth 2 (two) times daily.   oxymetazoline (AFRIN) 0.05 % nasal spray Place 1 spray into both nostrils daily as needed for congestion.   Phenylephrine HCl (PREPARATION H RE) Place 1 suppository rectally in the morning and at bedtime.   polyethylene glycol (MIRALAX / GLYCOLAX) 17 g packet Take 17 g by mouth daily.   sennosides-docusate sodium (SENOKOT-S) 8.6-50 MG tablet Take 1 tablet by mouth daily.   sertraline (ZOLOFT) 50 MG tablet Take 50 mg by mouth daily.   sodium chloride (OCEAN) 0.65 % SOLN nasal spray Place 1 spray into both nostrils 3 (three) times daily. And as needed   No facility-administered encounter medications on file as of 08/20/2023.     SIGNIFICANT DIAGNOSTIC EXAMS       ASSESSMENT/ PLAN:     Synthia Innocent NP [facility]  Contact [phone] Monday through Friday 8am- 5pm  After hours call [phone]

## 2023-08-21 ENCOUNTER — Other Ambulatory Visit (HOSPITAL_COMMUNITY)
Admission: RE | Admit: 2023-08-21 | Discharge: 2023-08-21 | Disposition: A | Discharge status: Home / Self Care | Payer: Medicare Other | Source: Skilled Nursing Facility | Attending: Adult Health | Admitting: Adult Health

## 2023-08-21 DIAGNOSIS — N1831 Chronic kidney disease, stage 3a: Secondary | ICD-10-CM | POA: Insufficient documentation

## 2023-08-21 LAB — BASIC METABOLIC PANEL
Anion gap: 9 (ref 5–15)
BUN: 21 mg/dL (ref 8–23)
CO2: 26 mmol/L (ref 22–32)
Calcium: 9 mg/dL (ref 8.9–10.3)
Chloride: 103 mmol/L (ref 98–111)
Creatinine, Ser: 0.88 mg/dL (ref 0.44–1.00)
GFR, Estimated: >60 mL/min (ref >60)
Glucose, Bld: 98 mg/dL (ref 70–99)
Potassium: 3.9 mmol/L (ref 3.5–5.1)
Sodium: 138 mmol/L (ref 135–145)

## 2023-09-03 ENCOUNTER — Encounter: Payer: Self-pay | Admitting: Adult Health

## 2023-09-03 ENCOUNTER — Non-Acute Institutional Stay (SKILLED_NURSING_FACILITY): Payer: Medicare Other | Admitting: Adult Health

## 2023-09-03 DIAGNOSIS — Z0000 Encounter for general adult medical examination without abnormal findings: Secondary | ICD-10-CM | POA: Diagnosis not present

## 2023-09-03 NOTE — Patient Instructions (Signed)
   Vickie Mckee , Thank you for taking time to come for your Medicare Wellness Visit. I appreciate your ongoing commitment to your health goals. Please review the following plan we discussed and let me know if I can assist you in the future.   These are the goals we discussed:  Goals      Absence of Fall and Fall-Related Injury     Evidence-based guidance:  Assess fall risk using a validated tool when available. Consider balance and gait impairment, muscle weakness, diminished vision or hearing, environmental hazards, presence of urinary or bowel urgency and/or incontinence.  Communicate fall injury risk to interprofessional healthcare team.  Develop a fall prevention plan with the patient and family.  Promote use of personal vision and auditory aids.  Promote reorientation, appropriate sensory stimulation, and routines to decrease risk of fall when changes in mental status are present.  Assess assistance level required for safe and effective self-care; consider referral for home care.  Encourage physical activity, such as performance of self-care at highest level of ability, strength and balance exercise program, and provision of appropriate assistive devices; refer to rehabilitation therapy.  Refer to community-based fall prevention program where available.  If fall occurs, determine the cause and revise fall injury prevention plan.  Regularly review medication contribution to fall risk; consider risk related to polypharmacy and age.  Refer to pharmacist for consultation when concerns about medications are revealed.  Balance adequate pain management with potential for oversedation.  Provide guidance related to environmental modifications.  Consider supplementation with Vitamin D.   Notes:      Follow up with Primary Care Provider     General - Client will not be readmitted within 30 days (C-SNP)        This is a list of the screening recommended for you and due dates:  Health  Maintenance  Topic Date Due   Medicare Annual Wellness Visit  08/23/2023   COVID-19 Vaccine (7 - 2023-24 season) 11/30/2023   DTaP/Tdap/Td vaccine (2 - Td or Tdap) 08/24/2031   Pneumonia Vaccine  Completed   Flu Shot  Completed   DEXA scan (bone density measurement)  Completed   Zoster (Shingles) Vaccine  Completed   HPV Vaccine  Aged Out

## 2023-09-03 NOTE — Progress Notes (Signed)
Subjective:   Vickie Mckee is a 89 y.o. female who presents for Medicare Annual (Subsequent) preventive examination.  Visit Complete: In person  Patient Medicare AWV questionnaire was completed by the patient on 09-03-23; I have confirmed that all information answered by patient is correct and no changes since this date.  Cardiac Risk Factors include: advanced age (>26men, >33 women);sedentary lifestyle  Review of Systems  Constitutional:  Negative for malaise/fatigue.  Respiratory:  Negative for cough and shortness of breath.   Cardiovascular:  Negative for chest pain, palpitations and leg swelling.  Gastrointestinal:  Negative for abdominal pain, constipation and heartburn.  Musculoskeletal:  Negative for back pain, joint pain and myalgias.  Skin: Negative.   Neurological:  Negative for dizziness.  Psychiatric/Behavioral:  The patient is not nervous/anxious.         Objective:    Today's Vitals   09/03/23 1455  BP: (!) 155/75  Pulse: 83  Resp: 20  Temp: 97.7 F (36.5 C)  SpO2: 98%  Weight: 126 lb 12.8 oz (57.5 kg)  Height: 5\' 2"  (1.575 m)   Body mass index is 23.19 kg/m.     08/20/2023    4:16 PM 07/25/2023    3:52 PM 06/26/2023   11:06 AM 05/15/2023    2:37 PM 04/09/2023   10:49 AM 03/19/2023   10:10 AM 03/14/2023    9:55 PM  Advanced Directives  Does Patient Have a Medical Advance Directive? Yes Yes Yes Yes Yes Yes Unable to assess, patient is non-responsive or altered mental status  Type of Advance Directive Out of facility DNR (pink MOST or yellow form) Out of facility DNR (pink MOST or yellow form) Out of facility DNR (pink MOST or yellow form) Out of facility DNR (pink MOST or yellow form) Out of facility DNR (pink MOST or yellow form) Out of facility DNR (pink MOST or yellow form)   Does patient want to make changes to medical advance directive? No - Patient declined No - Patient declined No - Patient declined No - Patient declined No - Patient declined No -  Patient declined   Pre-existing out of facility DNR order (yellow form or pink MOST form) Yellow form placed in chart (order not valid for inpatient use);Pink MOST form placed in chart (order not valid for inpatient use)  Yellow form placed in chart (order not valid for inpatient use);Pink MOST form placed in chart (order not valid for inpatient use) Pink MOST form placed in chart (order not valid for inpatient use) Pink MOST form placed in chart (order not valid for inpatient use)      Current Medications (verified) Outpatient Encounter Medications as of 09/03/2023  Medication Sig   acetaminophen (TYLENOL) 325 MG tablet Take 650 mg by mouth every 6 (six) hours.   amantadine (SYMMETREL) 50 MG/5ML solution Take 50 mg by mouth daily.   Cholecalciferol (VITAMIN D-3) 25 MCG (1000 UT) CAPS Take 1 capsule by mouth daily.   cyanocobalamin (VITAMIN B12) 1000 MCG tablet Take 2,000 mcg by mouth daily.   feeding supplement (ENSURE ENLIVE / ENSURE PLUS) LIQD Take 237 mLs by mouth 2 (two) times daily between meals.   levothyroxine (SYNTHROID) 50 MCG tablet Take 50 mcg by mouth daily before breakfast.   lisinopril (ZESTRIL) 5 MG tablet Take 5 mg by mouth daily.   magnesium hydroxide (MILK OF MAGNESIA) 400 MG/5ML suspension Take 30 mLs by mouth daily as needed for mild constipation.   memantine (NAMENDA XR) 28 MG CP24 24 hr  capsule Take 28 mg by mouth daily.   Menthol, Topical Analgesic, (BIOFREEZE) 4 % GEL Apply 1 application  topically 3 (three) times daily as needed.   omeprazole (PRILOSEC) 20 MG capsule Take 20 mg by mouth 2 (two) times daily.   oxymetazoline (AFRIN) 0.05 % nasal spray Place 1 spray into both nostrils daily as needed for congestion.   Phenylephrine HCl (PREPARATION H RE) Place 1 suppository rectally in the morning and at bedtime.   polyethylene glycol (MIRALAX / GLYCOLAX) 17 g packet Take 17 g by mouth daily.   sennosides-docusate sodium (SENOKOT-S) 8.6-50 MG tablet Take 1 tablet by mouth  daily.   sertraline (ZOLOFT) 50 MG tablet Take 50 mg by mouth daily.   sodium chloride (OCEAN) 0.65 % SOLN nasal spray Place 1 spray into both nostrils 3 (three) times daily. And as needed   No facility-administered encounter medications on file as of 09/03/2023.    Allergies (verified) Hydrochlorothiazide and Codeine   History: Past Medical History:  Diagnosis Date   Decreased sense of smell    and taste-negative CT scan-2011   Depression    Essential hypertension    GERD (gastroesophageal reflux disease)    Heart murmur    heard first time 4/12   History of mammogram    2010 and she does not want to repeat them anymore   HOCM (hypertrophic obstructive cardiomyopathy) (HCC)    a. Dx by echo 05/2014.   Hypothyroidism    IBS (irritable bowel syndrome)    Impaired fasting glucose    Low back pain    Lumbar radiculopathy    with negative MRI (1991)   Postmenopausal    with osteopenia, bisphosphonates 1/05-1/11, improved osteopenia 4/12-repeat planned late 2015   Shingles    2015   Thyroid nodule    Vitamin D deficiency    repleted on weekly vitamin D 47829 iu   Past Surgical History:  Procedure Laterality Date   ABDOMINAL HYSTERECTOMY     BREAST BIOPSY     x2   CATARACT EXTRACTION     removal with IOL bilateral   GLAUCOMA SURGERY     laser   skin cancer removal     THYROIDECTOMY     subtotal   Family History  Problem Relation Age of Onset   Breast cancer Mother    Hypertension Father    CVA Father    Heart failure Sister    CAD Sister    Kidney disease Sister    COPD Brother    Lung cancer Brother    CAD Brother    Diabetes Brother    Stroke Father    Heart attack Neg Hx    Social History   Socioeconomic History   Marital status: Married    Spouse name: Not on file   Number of children: Not on file   Years of education: Not on file   Highest education level: Not on file  Occupational History   Not on file  Tobacco Use   Smoking status: Never    Smokeless tobacco: Never  Vaping Use   Vaping status: Never Used  Substance and Sexual Activity   Alcohol use: No    Alcohol/week: 0.0 standard drinks of alcohol   Drug use: Never   Sexual activity: Not on file  Other Topics Concern   Not on file  Social History Narrative   Not on file   Social Determinants of Health   Financial Resource Strain: Not on file  Food Insecurity: No Food Insecurity (03/14/2023)   Hunger Vital Sign    Worried About Running Out of Food in the Last Year: Never true    Ran Out of Food in the Last Year: Never true  Transportation Needs: No Transportation Needs (03/14/2023)   PRAPARE - Administrator, Civil Service (Medical): No    Lack of Transportation (Non-Medical): No  Physical Activity: Not on file  Stress: Not on file  Social Connections: Not on file    Tobacco Counseling Counseling given: Not Answered   Clinical Intake:  Pre-visit preparation completed: Yes  Pain : No/denies pain     BMI - recorded: 23.19 Nutritional Status: BMI of 19-24  Normal Nutritional Risks: Unintentional weight loss Diabetes: No  How often do you need to have someone help you when you read instructions, pamphlets, or other written materials from your doctor or pharmacy?: 5 - Always  Interpreter Needed?: No      Activities of Daily Living    09/03/2023    2:58 PM 03/14/2023    5:00 PM  In your present state of health, do you have any difficulty performing the following activities:  Hearing? 0 0  Vision? 0 0  Difficulty concentrating or making decisions? 1 1  Walking or climbing stairs? 1 1  Dressing or bathing? 1 1  Doing errands, shopping? 1 1  Preparing Food and eating ? Y   Using the Toilet? Y   In the past six months, have you accidently leaked urine? Y   Do you have problems with loss of bowel control? Y   Managing your Medications? Y   Managing your Finances? Y   Housekeeping or managing your Housekeeping? Y     Patient Care  Team: Sharee Holster, NP as PCP - General (Geriatric Medicine)  Indicate any recent Medical Services you may have received from other than [facility] providers in the past year (date may be approximate).     Assessment:   This is a routine wellness examination for Jarah.  Hearing/Vision screen No results found.   Goals Addressed             This Visit's Progress    Absence of Fall and Fall-Related Injury   On track    Evidence-based guidance:  Assess fall risk using a validated tool when available. Consider balance and gait impairment, muscle weakness, diminished vision or hearing, environmental hazards, presence of urinary or bowel urgency and/or incontinence.  Communicate fall injury risk to interprofessional healthcare team.  Develop a fall prevention plan with the patient and family.  Promote use of personal vision and auditory aids.  Promote reorientation, appropriate sensory stimulation, and routines to decrease risk of fall when changes in mental status are present.  Assess assistance level required for safe and effective self-care; consider referral for home care.  Encourage physical activity, such as performance of self-care at highest level of ability, strength and balance exercise program, and provision of appropriate assistive devices; refer to rehabilitation therapy.  Refer to community-based fall prevention program where available.  If fall occurs, determine the cause and revise fall injury prevention plan.  Regularly review medication contribution to fall risk; consider risk related to polypharmacy and age.  Refer to pharmacist for consultation when concerns about medications are revealed.  Balance adequate pain management with potential for oversedation.  Provide guidance related to environmental modifications.  Consider supplementation with Vitamin D.   Notes:      Follow up with Primary  Care Provider   On track    General - Client will not be readmitted within 30  days (C-SNP)   On track      Depression Screen    09/03/2023    2:59 PM 01/29/2023   11:34 AM 11/19/2022   12:09 PM 08/22/2022    2:48 PM 02/08/2022    2:06 PM 11/02/2021    1:01 PM 10/24/2021    1:07 PM  PHQ 2/9 Scores  PHQ - 2 Score 0 0 0 0 0 0 0  PHQ- 9 Score   0 0 0 0     Fall Risk    09/03/2023    2:59 PM 01/29/2023   11:33 AM 11/19/2022   12:09 PM 08/22/2022    2:48 PM 08/10/2022    2:10 PM  Fall Risk   Falls in the past year? 0 0 0 1 1  Number falls in past yr: 0 0 0 0 1  Injury with Fall? 0 0 0 0 1  Risk for fall due to : Impaired balance/gait;Impaired mobility No Fall Risks Impaired balance/gait;Impaired mobility History of fall(s);Impaired balance/gait;Impaired mobility History of fall(s);Impaired balance/gait;Impaired mobility  Follow up  Falls evaluation completed   Falls evaluation completed    MEDICARE RISK AT HOME: Medicare Risk at Home Any stairs in or around the home?: Yes If so, are there any without handrails?: No Home free of loose throw rugs in walkways, pet beds, electrical cords, etc?: Yes Adequate lighting in your home to reduce risk of falls?: Yes Life alert?: No Use of a cane, walker or w/c?: Yes Grab bars in the bathroom?: Yes Shower chair or bench in shower?: Yes Elevated toilet seat or a handicapped toilet?: Yes  TIMED UP AND GO:  Was the test performed?  No    Cognitive Function:    09/03/2023    2:59 PM 08/22/2022    2:49 PM 08/21/2021    1:27 PM  MMSE - Mini Mental State Exam  Not completed: Unable to complete Unable to complete Unable to complete        Immunizations Immunization History  Administered Date(s) Administered   Fluad Quad(high Dose 65+) 07/26/2021   Influenza,inj,Quad PF,6+ Mos 08/12/2023   Influenza-Unspecified 07/20/2020, 07/25/2022   Moderna Covid-19 Vaccine Bivalent Booster 51yrs & up 08/15/2021   Moderna SARS-COV2 Booster Vaccination 03/13/2022, 08/28/2022   Moderna Sars-Covid-2 Vaccination 02/10/2020,  03/08/2020   PNEUMOCOCCAL CONJUGATE-20 04/06/2022   Pfizer(Comirnaty)Fall Seasonal Vaccine 12 years and older 07/30/2023   Pneumococcal Conjugate-13 03/09/2014   Pneumococcal Polysaccharide-23 09/11/2004   Rsv, Bivalent, Protein Subunit Rsvpref,pf Verdis Frederickson) 09/24/2022   Tdap 08/23/2021   Tetanus 01/03/2009   Zoster Recombinant(Shingrix) 11/14/2021, 02/16/2022    TDAP status: Up to date  Flu Vaccine status: Up to date  Pneumococcal vaccine status: Up to date  Covid-19 vaccine status: Completed vaccines  Qualifies for Shingles Vaccine? No   Zostavax completed Yes   Shingrix Completed?: No.    Education has been provided regarding the importance of this vaccine. Patient has been advised to call insurance company to determine out of pocket expense if they have not yet received this vaccine. Advised may also receive vaccine at local pharmacy or Health Dept. Verbalized acceptance and understanding.  Screening Tests Health Maintenance  Topic Date Due   Medicare Annual Wellness (AWV)  08/23/2023   COVID-19 Vaccine (7 - 2023-24 season) 11/30/2023   DTaP/Tdap/Td (2 - Td or Tdap) 08/24/2031   Pneumonia Vaccine 20+ Years old  Completed   INFLUENZA  VACCINE  Completed   DEXA SCAN  Completed   Zoster Vaccines- Shingrix  Completed   HPV VACCINES  Aged Out    Health Maintenance  Health Maintenance Due  Topic Date Due   Medicare Annual Wellness (AWV)  08/23/2023    Colorectal cancer screening: No longer required.   Mammogram status: No longer required due to age.  Bone Density status: Completed  . Results reflect: Bone density results: OSTEOPOROSIS. Repeat every   years.  Lung Cancer Screening: (Low Dose CT Chest recommended if Age 74-80 years, 20 pack-year currently smoking OR have quit w/in 15years.) does not qualify.   Lung Cancer Screening Referral:   Additional Screening:  Hepatitis C Screening: does not qualify; Completed   Vision Screening: Recommended annual  ophthalmology exams for early detection of glaucoma and other disorders of the eye. Is the patient up to date with their annual eye exam?  No  Who is the provider or what is the name of the office in which the patient attends annual eye exams?  If pt is not established with a provider, would they like to be referred to a provider to establish care? No .   Dental Screening: Recommended annual dental exams for proper oral hygiene  Diabetic Foot Exam:   Community Resource Referral / Chronic Care Management: CRR required this visit?  No   CCM required this visit?  No     Plan:     I have personally reviewed and noted the following in the patient's chart:   Medical and social history Use of alcohol, tobacco or illicit drugs  Current medications and supplements including opioid prescriptions. Patient is not currently taking opioid prescriptions. Functional ability and status Nutritional status Physical activity Advanced directives List of other physicians Hospitalizations, surgeries, and ER visits in previous 12 months Vitals Screenings to include cognitive, depression, and falls Referrals and appointments  In addition, I have reviewed and discussed with patient certain preventive protocols, quality metrics, and best practice recommendations. A written personalized care plan for preventive services as well as general preventive health recommendations were provided to patient.     Sharee Holster, NP   09/03/2023   After Visit Summary: (In Person-Declined) Patient declined AVS at this time.  Nurse Notes: this exam was performed at this facility by myself.

## 2023-09-17 ENCOUNTER — Non-Acute Institutional Stay (SKILLED_NURSING_FACILITY): Payer: Medicare Other | Admitting: Adult Health

## 2023-09-17 ENCOUNTER — Encounter: Payer: Self-pay | Admitting: Adult Health

## 2023-09-17 DIAGNOSIS — F339 Major depressive disorder, recurrent, unspecified: Secondary | ICD-10-CM | POA: Diagnosis not present

## 2023-09-17 DIAGNOSIS — K5909 Other constipation: Secondary | ICD-10-CM | POA: Diagnosis not present

## 2023-09-17 DIAGNOSIS — K219 Gastro-esophageal reflux disease without esophagitis: Secondary | ICD-10-CM | POA: Diagnosis not present

## 2023-09-17 NOTE — Progress Notes (Unsigned)
Location:  [facility] Room Number: [id number] Place of Service:  SNF (31)   CODE STATUS: dnr  Allergies  Allergen Reactions   Hydrochlorothiazide     05/29/2021 K+ 2.6 Other reaction(s): Other (See Comments) 05/29/2021 K+ 2.6   Codeine Nausea Only    Chief Complaint  Patient presents with   Medical Management of Chronic Issues            Depression recurrent:   GERD without esophagitis: Chronic constipation     HPI:  She is a 89 year old long term resident of this facility being seen for the management of her chronic illnesses: Depression recurrent:   GERD without esophagitis: Chronic constipation. There are no reports of heart burn; or constipation.   Past Medical History:  Diagnosis Date   Decreased sense of smell    and taste-negative CT scan-2011   Depression    Essential hypertension    GERD (gastroesophageal reflux disease)    Heart murmur    heard first time 4/12   History of mammogram    2010 and she does not want to repeat them anymore   HOCM (hypertrophic obstructive cardiomyopathy) (HCC)    a. Dx by echo 05/2014.   Hypothyroidism    IBS (irritable bowel syndrome)    Impaired fasting glucose    Low back pain    Lumbar radiculopathy    with negative MRI (1991)   Postmenopausal    with osteopenia, bisphosphonates 1/05-1/11, improved osteopenia 4/12-repeat planned late 2015   Shingles    2015   Thyroid nodule    Vitamin D deficiency    repleted on weekly vitamin D 96045 iu    Past Surgical History:  Procedure Laterality Date   ABDOMINAL HYSTERECTOMY     BREAST BIOPSY     x2   CATARACT EXTRACTION     removal with IOL bilateral   GLAUCOMA SURGERY     laser   skin cancer removal     THYROIDECTOMY     subtotal    Social History   Socioeconomic History   Marital status: Married    Spouse name: Not on file   Number of children: Not on file   Years of education: Not on file   Highest education level: Not on file  Occupational  History   Not on file  Tobacco Use   Smoking status: Never   Smokeless tobacco: Never  Vaping Use   Vaping status: Never Used  Substance and Sexual Activity   Alcohol use: No    Alcohol/week: 0.0 standard drinks of alcohol   Drug use: Never   Sexual activity: Not on file  Other Topics Concern   Not on file  Social History Narrative   Not on file   Social Determinants of Health   Financial Resource Strain: Not on file  Food Insecurity: No Food Insecurity (03/14/2023)   Hunger Vital Sign    Worried About Running Out of Food in the Last Year: Never true    Ran Out of Food in the Last Year: Never true  Transportation Needs: No Transportation Needs (03/14/2023)   PRAPARE - Administrator, Civil Service (Medical): No    Lack of Transportation (Non-Medical): No  Physical Activity: Not on file  Stress: Not on file  Social Connections: Not on file  Intimate Partner Violence: Not At Risk (03/14/2023)   Humiliation, Afraid, Rape, and Kick questionnaire    Fear of Current or Ex-Partner: No  Emotionally Abused: No    Physically Abused: No    Sexually Abused: No   Family History  Problem Relation Age of Onset   Breast cancer Mother    Hypertension Father    CVA Father    Heart failure Sister    CAD Sister    Kidney disease Sister    COPD Brother    Lung cancer Brother    CAD Brother    Diabetes Brother    Stroke Father    Heart attack Neg Hx       VITAL SIGNS BP 135/75   Pulse 75   Temp (!) 97.4 F (36.3 C)   Resp 20   Ht 5\' 2"  (1.575 m)   Wt 126 lb 12.8 oz (57.5 kg)   SpO2 98%   BMI 23.19 kg/m   Outpatient Encounter Medications as of 09/17/2023  Medication Sig   acetaminophen (TYLENOL) 325 MG tablet Take 650 mg by mouth every 6 (six) hours.   amantadine (SYMMETREL) 50 MG/5ML solution Take 50 mg by mouth daily.   Cholecalciferol (VITAMIN D-3) 25 MCG (1000 UT) CAPS Take 1 capsule by mouth daily.   cyanocobalamin (VITAMIN B12) 1000 MCG tablet Take  2,000 mcg by mouth daily.   feeding supplement (ENSURE ENLIVE / ENSURE PLUS) LIQD Take 237 mLs by mouth 2 (two) times daily between meals.   levothyroxine (SYNTHROID) 50 MCG tablet Take 50 mcg by mouth daily before breakfast.   lisinopril (ZESTRIL) 5 MG tablet Take 5 mg by mouth daily.   magnesium hydroxide (MILK OF MAGNESIA) 400 MG/5ML suspension Take 30 mLs by mouth daily as needed for mild constipation.   memantine (NAMENDA XR) 28 MG CP24 24 hr capsule Take 28 mg by mouth daily.   Menthol, Topical Analgesic, (BIOFREEZE) 4 % GEL Apply 1 application  topically 3 (three) times daily as needed.   omeprazole (PRILOSEC) 20 MG capsule Take 20 mg by mouth 2 (two) times daily.   oxymetazoline (AFRIN) 0.05 % nasal spray Place 1 spray into both nostrils daily as needed for congestion.   Phenylephrine HCl (PREPARATION H RE) Place 1 suppository rectally in the morning and at bedtime.   polyethylene glycol (MIRALAX / GLYCOLAX) 17 g packet Take 17 g by mouth daily.   sennosides-docusate sodium (SENOKOT-S) 8.6-50 MG tablet Take 1 tablet by mouth daily.   sertraline (ZOLOFT) 50 MG tablet Take 50 mg by mouth daily.   sodium chloride (OCEAN) 0.65 % SOLN nasal spray Place 1 spray into both nostrils 3 (three) times daily. And as needed   No facility-administered encounter medications on file as of 09/17/2023.     SIGNIFICANT DIAGNOSTIC EXAMS  PREVIOUS    03-14-23: ct of abdomen and pelvis:  1. No acute abnormality in the abdomen or pelvis. 2. Colonic diverticulosis without acute diverticulitis. Mildly distended rectum containing stool. 3. Diffuse peripheral reticulations and bilateral lower lobe traction bronchiectasis with scattered subsegmental mucous plugging, suggestive of chronic interstitial lung disease. 4. Irregular left lower lobe lung nodule measures 6 mm. Non-contrast chest CT at 6-12 months is recommended. If the nodule is stable at time of repeat CT, then future CT at 18-24 months (from today's  scan) is considered optional for low-risk patients, but is recommended for high-risk patients.  5. Aortic Atherosclerosis. Coronary artery calcifications.   LABS REVIEWED PREVIOUS     09-13-22: tsh 2.261 free t4: 0.95 11-20-22: urine culture: klebsiella pneumoniae  01-31-23: wbc 8.6; hgb 13.4; hct 40.9; mcv 92.7 plt 208; glucose 95; bun  20; creat 0.87; k+ 3.3; na++ 138; ca 8.7; gfr >60; protein 6.1; albumin 3.4 hgb A1c 5.6; tsh 2.505; vitamin D 37.78; vitamin B 12: 207 vitamin B 6: 4.4 03-14-23: wbc 16.7; hgb 9.5; hct 28.1; mcv 96.6 plt 214; glucose 154; bun 34; creat 1.36; k+ 5.7; na++ 138; ca 9.1 gfr 37; protein 6.0 albumin 3.4 03-14-23 (#2) wbc 16.1; hgb 8.5; hct 26.3; mcv 96.0 plt  205; glucose 140; bun 38; creat 1.35; k+ 4.9; na++ 138; ca 8.9; gfr 38; protein 5.8; albumin 3.2 FOB +; blood culture: no growth 03-15-23: wbc 10.9; hgb 12.2; hct 36.9; mcv 92.7 plt 149; glucose 101; bun 25; creat 1.03 ;k+ 3.9; na++ 139; ca 8.4 gfr 52 03-17-23: wbc 8.5; hgb 11.4; hct 34.5; mcv 93.5 plt 151; glucose 99; bun 18;creat 0.89; k+ 3.6; na++ 138; ca 8.2 gfr >60; mag 2.3  03-25-23: wbc 8.4; hgb 11.8; hct 37.1 mcv 95.9 plt 245  04-29-23: vitamin B 12: 491 05-13-23: urine culture: proteus mirabilis 06-06-23: wbc 9.9; hgb 13.8; hct 41.9; mcv 90.5 plt 206; glucose 97; bun 18; creat 1.08; k+ 3.4; na++ 136; ca 8.9 gfr 49; d-dimer 0.56  CRP 4.9 07-29-23: tsh 2.759; vitamin D 34.06  TODAY  08-21-23: glucose 98; bun 21; creat 0.88; k+ 3.9; na++ 138; ca 9.0; gfr >60   Review of Systems  Constitutional:  Negative for malaise/fatigue.  Respiratory:  Negative for cough and shortness of breath.   Cardiovascular:  Negative for chest pain, palpitations and leg swelling.  Gastrointestinal:  Negative for abdominal pain, constipation and heartburn.  Musculoskeletal:  Negative for back pain, joint pain and myalgias.  Skin: Negative.   Neurological:  Negative for dizziness.  Psychiatric/Behavioral:  The patient is not  nervous/anxious.    Physical Exam Constitutional:      General: She is not in acute distress.    Appearance: She is well-developed. She is not diaphoretic.  Neck:     Thyroid: No thyromegaly.  Cardiovascular:     Rate and Rhythm: Normal rate and regular rhythm.     Pulses: Normal pulses.     Heart sounds: Normal heart sounds.  Pulmonary:     Effort: Pulmonary effort is normal. No respiratory distress.     Breath sounds: Normal breath sounds.  Abdominal:     General: Bowel sounds are normal. There is no distension.     Palpations: Abdomen is soft.     Tenderness: There is no abdominal tenderness.  Musculoskeletal:     Cervical back: Neck supple.     Right lower leg: No edema.     Left lower leg: No edema.     Comments:  Left hemiplegia  Wears TED hose    Lymphadenopathy:     Cervical: No cervical adenopathy.  Skin:    General: Skin is warm and dry.  Neurological:     Mental Status: She is alert. Mental status is at baseline.     Comments: 07-03-23: BCAT 12/50  Psychiatric:        Mood and Affect: Mood normal.     ASSESSMENT/ PLAN:  TODAY  Depression recurrent: will continue zoloft 50 mg daily   2. GERD without esophagitis: will continue prilosec 20 mg daily   3. Chronic constipation: will continue miralax daily   PREVIOUS   4. Thoracic aortic atherosclerosis (cxr 12-15-14) not on statin due to advanced age  84. History of CVA/left hemiplegia: is off asa due to GI bleed  6. Chronic generalized pain: will continue tylenol  650 mg every 6 hours  7. Dysphagia: no indications of aspiration present: is on thin liquids  8. Stage 3a chronic kidney disease bun 21; creat 0.88 gfr >60   9. Gastrointestinal hemorrhage associated with diverticulosis: did receive packed cells; hgb is presently stable the bleeding felt more than likely from diverticulosis.   10. Interstitial lung disease: is stable  11. HOCM (hypertrophic obstructive cardiomyopathy) will monitor   12.  Vascular dementia without behavioral disturbance/neurocognitive deficits/cerebral atrophy: weight is 126 pounds; will continue  namenda xr 28 mg daily aricept has been stopped due to weight loss   13. Somnolence post stroke will continue amantadine  50 mg daily and will monitor   14. Hypoalbuminemia due to protein calorie malnutrition: albumin 3.4 will continue supplements as directed  15. Vitamin B 12 deficiency: level 491 will continue 2000 mcg daily   16. Vitamin D deficiency: level 34.06; will continue 1000 units daily   17. Essential hypertension: b/p 135/75;  will continue  lisinopril 5 mg daily   18. Postoperative hypothyroidism: tsh 2.759 will continue synthroid 50 mcg daily   19. Mixed hyperlipidemia: is off statin due to advanced age.   20. Psychosis in the elderly without behavioral disturbance: is off seroquel       Synthia Innocent NP [facility]  call [phone]

## 2023-09-25 ENCOUNTER — Encounter: Payer: Self-pay | Admitting: Internal Medicine

## 2023-09-25 ENCOUNTER — Non-Acute Institutional Stay (SKILLED_NURSING_FACILITY): Payer: Medicare Other | Admitting: Internal Medicine

## 2023-09-25 DIAGNOSIS — J849 Interstitial pulmonary disease, unspecified: Secondary | ICD-10-CM

## 2023-09-25 DIAGNOSIS — F0150 Vascular dementia without behavioral disturbance: Secondary | ICD-10-CM

## 2023-09-25 DIAGNOSIS — E890 Postprocedural hypothyroidism: Secondary | ICD-10-CM

## 2023-09-25 DIAGNOSIS — G8194 Hemiplegia, unspecified affecting left nondominant side: Secondary | ICD-10-CM

## 2023-09-25 DIAGNOSIS — N1831 Chronic kidney disease, stage 3a: Secondary | ICD-10-CM

## 2023-09-25 DIAGNOSIS — E559 Vitamin D deficiency, unspecified: Secondary | ICD-10-CM

## 2023-09-25 NOTE — Progress Notes (Unsigned)
NURSING HOME LOCATION:  [facility] ROOM NUMBER:  [id number] P  CODE STATUS: DNR  PCP:  Synthia Innocent NP  This is a nursing facility follow up visit of chronic medical diagnoses & to document compliance with Regulation 483.30 (c) in The Long Term Care Survey Manual Phase 2 which mandates caregiver visit ( visits can alternate among physician, PA or NP as per statutes) within 10 days of 30 days / 60 days/ 90 days post admission to SNF date    Interim medical record and care since last SNF visit was updated with review of diagnostic studies and change in clinical status since last visit were documented.  HPI: She is a permanent resident of this facility with medical diagnoses of essential hypertension, GERD, hypertrophic obstructive cardiomyopathy, hypothyroidism, IBS, vitamin D deficiency, and dementia.  Review of systems: Dementia invalidated responses. It was after 3 pm. She stated that she had just returned from lunch but actually she did been playing bingo.  She confabulated about the left hemiplegia being related to falling from an apple tree as a young child.  She denied ever having any thyroid surgery.  She denied any active symptoms except for intermittent numbness and tingling but could not tell me where.  She then stated "must not have it much.".  Constitutional: No fever, significant weight change, fatigue  Eyes: No redness, discharge, pain, vision change ENT/mouth: No nasal congestion,  purulent discharge, earache, change in hearing, sore throat  Cardiovascular: No chest pain, palpitations, paroxysmal nocturnal dyspnea, claudication, edema  Respiratory: No cough, sputum production, hemoptysis, DOE, significant snoring, apnea   Gastrointestinal: No heartburn, dysphagia, abdominal pain, nausea /vomiting, rectal bleeding, melena, change in bowels Genitourinary: No dysuria, hematuria, pyuria, incontinence, nocturia Musculoskeletal: No joint stiffness, joint swelling,  pain Dermatologic: No rash, pruritus, change in appearance of skin Neurologic: No dizziness, headache, syncope, seizures, numbness, tingling Psychiatric: No significant anxiety, depression, insomnia, anorexia Endocrine: No change in hair/skin/nails, excessive thirst, excessive hunger, excessive urination  Hematologic/lymphatic: No significant bruising, lymphadenopathy, abnormal bleeding Allergy/immunology: No itchy/watery eyes, significant sneezing, urticaria, angioedema  Physical exam:  Pertinent or positive findings: She appears suboptimally nourished and appears her age.  Eyebrows are decreased in density.  First heart sound is slightly accentuated.  She has rales in the lower half of the posterior thorax.  She has 1/2+ edema at the sock line.  Pedal pulses are not palpable.  She has marked interosseous wasting.  Left hemiparesis suggested; left upper extremity is in a sling.  The left hand is contracted.  The right lower extremity was stronger than the left to opposition.  General appearance: no acute distress, increased work of breathing is present.   Lymphatic: No lymphadenopathy about the head, neck, axilla. Eyes: No conjunctival inflammation or lid edema is present. There is no scleral icterus. Ears:  External ear exam shows no significant lesions or deformities.   Nose:  External nasal examination shows no deformity or inflammation. Nasal mucosa are pink and moist without lesions, exudates Oral exam:  Lips and gums are healthy appearing. There is no oropharyngeal erythema or exudate. Neck:  No thyromegaly, masses, tenderness noted.    Heart:  Normal rate and regular rhythm without gallop, murmur, click, rub .  Lungs:  no wheezes, rhonchi, rubs. Abdomen: Bowel sounds are normal. Abdomen is soft and nontender with no organomegaly, hernias, masses. GU: Deferred  Extremities:  No cyanosis, clubbing  Neurologic exam :Balance, Rhomberg, finger to nose testing could not be completed due to  clinical state Deep tendon reflexes are equal Skin: Warm & dry w/o tenting. No significant lesions or rash.  See summary under each active problem in the Problem List with associated updated therapeutic plan

## 2023-09-25 NOTE — Assessment & Plan Note (Signed)
TSH is current and therapeutic at 2.759; no change indicated in L-thyroxine dose.

## 2023-09-25 NOTE — Assessment & Plan Note (Signed)
Current vitamin D level is low normal at 34.06 without supplementation.  Continue to monitor.

## 2023-09-25 NOTE — Assessment & Plan Note (Signed)
Creatinine has improved from 1.08-0.88 and GFR from 49 to greater than 60.  CKD is now stage II.  Med list reviewed; no change in med dosages indicated.

## 2023-09-25 NOTE — Patient Instructions (Signed)
See assessment and plan under each diagnosis in the problem list and acutely for this visit 

## 2023-09-26 NOTE — Assessment & Plan Note (Signed)
Post CVA deficit is unchanged.  She continues to confabulate that this is related to falling out of an apple tree as a young child.

## 2023-09-26 NOTE — Assessment & Plan Note (Signed)
Exam took place after 3 PM after she returned from playing bingo.  She insisted she had just returned from lunch.  She confabulates about the left hemiparesis being related to a fall from a tree as a child.  She did state that she had numbness and tingling sometimes but could not define where.  At that point she stated "must not have it much."  She is pleasantly demented without any behavioral issues and is most engaging and charming.

## 2023-09-26 NOTE — Assessment & Plan Note (Signed)
Despite the presence of rales in the lower lobes bilaterally and history of interstitial lung disease; she denies any pulmonary symptoms.  O2 sats are excellent at 97% on room air.  Continue to monitor.

## 2023-10-11 ENCOUNTER — Non-Acute Institutional Stay (SKILLED_NURSING_FACILITY): Payer: Medicare Other | Admitting: Adult Health

## 2023-10-11 ENCOUNTER — Encounter: Payer: Self-pay | Admitting: Adult Health

## 2023-10-11 DIAGNOSIS — I421 Obstructive hypertrophic cardiomyopathy: Secondary | ICD-10-CM

## 2023-10-11 DIAGNOSIS — J849 Interstitial pulmonary disease, unspecified: Secondary | ICD-10-CM | POA: Diagnosis not present

## 2023-10-11 DIAGNOSIS — G8194 Hemiplegia, unspecified affecting left nondominant side: Secondary | ICD-10-CM

## 2023-10-11 NOTE — Progress Notes (Signed)
Location:  [facility] Room Number: 138 Place of Service:  SNF (31)   CODE STATUS: dnr   Allergies  Allergen Reactions   Hydrochlorothiazide     05/29/2021 K+ 2.6 Other reaction(s): Other (See Comments) 05/29/2021 K+ 2.6   Codeine Nausea Only    Chief Complaint  Patient presents with   Acute Visit    Care plan meeting     HPI:  We have come together for her care plan meeting. BIMS 5/15 mood 1/30: nervous at times. Uses wheelchair without falls. She requires moderate to dependent assist with her adls care. She is frequently incontinent of bladder and bowel. Dietary: regular diet weight is 125.4 pounds; appetite is good. Requires setup for meals. Therapy: none at this time. Activities: is involved. She will continue to be followed for her chronic illnesses including:   HOCM (hypertrophic obstructive cardiomyopathy)    Interstitial lung disease     Left hemiplegia  Past Medical History:  Diagnosis Date   Decreased sense of smell    and taste-negative CT scan-2011   Depression    Essential hypertension    GERD (gastroesophageal reflux disease)    Heart murmur    heard first time 4/12   History of mammogram    2010 and she does not want to repeat them anymore   HOCM (hypertrophic obstructive cardiomyopathy) (HCC)    a. Dx by echo 05/2014.   Hypothyroidism    IBS (irritable bowel syndrome)    Impaired fasting glucose    Low back pain    Lumbar radiculopathy    with negative MRI (1991)   Postmenopausal    with osteopenia, bisphosphonates 1/05-1/11, improved osteopenia 4/12-repeat planned late 2015   Shingles    2015   Thyroid nodule    Vitamin D deficiency    repleted on weekly vitamin D 08657 iu    Past Surgical History:  Procedure Laterality Date   ABDOMINAL HYSTERECTOMY     BREAST BIOPSY     x2   CATARACT EXTRACTION     removal with IOL bilateral   GLAUCOMA SURGERY     laser   skin cancer removal     THYROIDECTOMY     subtotal     Social History   Socioeconomic History   Marital status: Married    Spouse name: Not on file   Number of children: Not on file   Years of education: Not on file   Highest education level: Not on file  Occupational History   Not on file  Tobacco Use   Smoking status: Never   Smokeless tobacco: Never  Vaping Use   Vaping status: Never Used  Substance and Sexual Activity   Alcohol use: No    Alcohol/week: 0.0 standard drinks of alcohol   Drug use: Never   Sexual activity: Not on file  Other Topics Concern   Not on file  Social History Narrative   Not on file   Social Drivers of Health   Financial Resource Strain: Not on file  Food Insecurity: No Food Insecurity (03/14/2023)   Hunger Vital Sign    Worried About Running Out of Food in the Last Year: Never true    Ran Out of Food in the Last Year: Never true  Transportation Needs: No Transportation Needs (03/14/2023)   PRAPARE - Administrator, Civil Service (Medical): No    Lack of Transportation (Non-Medical): No  Physical Activity: Not on file  Stress: Not  on file  Social Connections: Not on file  Intimate Partner Violence: Not At Risk (03/14/2023)   Humiliation, Afraid, Rape, and Kick questionnaire    Fear of Current or Ex-Partner: No    Emotionally Abused: No    Physically Abused: No    Sexually Abused: No   Family History  Problem Relation Age of Onset   Breast cancer Mother    Hypertension Father    CVA Father    Heart failure Sister    CAD Sister    Kidney disease Sister    COPD Brother    Lung cancer Brother    CAD Brother    Diabetes Brother    Stroke Father    Heart attack Neg Hx       VITAL SIGNS BP (!) 133/54   Pulse 94   Temp 98.8 F (37.1 C)   Resp 19   Ht 5\' 2"  (1.575 m)   Wt 127 lb (57.6 kg)   SpO2 98%   BMI 23.23 kg/m   Outpatient Encounter Medications as of 10/11/2023  Medication Sig   acetaminophen (TYLENOL) 325 MG tablet Take 650 mg by mouth every 6 (six)  hours.   amantadine (SYMMETREL) 50 MG/5ML solution Take 50 mg by mouth daily.   Cholecalciferol (VITAMIN D-3) 25 MCG (1000 UT) CAPS Take 1 capsule by mouth daily.   cyanocobalamin (VITAMIN B12) 1000 MCG tablet Take 2,000 mcg by mouth daily.   feeding supplement (ENSURE ENLIVE / ENSURE PLUS) LIQD Take 237 mLs by mouth 2 (two) times daily between meals.   levothyroxine (SYNTHROID) 50 MCG tablet Take 50 mcg by mouth daily before breakfast.   lisinopril (ZESTRIL) 5 MG tablet Take 5 mg by mouth daily.   magnesium hydroxide (MILK OF MAGNESIA) 400 MG/5ML suspension Take 30 mLs by mouth daily as needed for mild constipation.   memantine (NAMENDA XR) 28 MG CP24 24 hr capsule Take 28 mg by mouth daily.   Menthol, Topical Analgesic, (BIOFREEZE) 4 % GEL Apply 1 application  topically 3 (three) times daily as needed.   omeprazole (PRILOSEC) 20 MG capsule Take 20 mg by mouth 2 (two) times daily.   oxymetazoline (AFRIN) 0.05 % nasal spray Place 1 spray into both nostrils daily as needed for congestion.   Phenylephrine HCl (PREPARATION H RE) Place 1 suppository rectally in the morning and at bedtime.   polyethylene glycol (MIRALAX / GLYCOLAX) 17 g packet Take 17 g by mouth daily.   sennosides-docusate sodium (SENOKOT-S) 8.6-50 MG tablet Take 1 tablet by mouth daily.   sertraline (ZOLOFT) 50 MG tablet Take 50 mg by mouth daily.   sodium chloride (OCEAN) 0.65 % SOLN nasal spray Place 1 spray into both nostrils 3 (three) times daily. And as needed   No facility-administered encounter medications on file as of 10/11/2023.     SIGNIFICANT DIAGNOSTIC EXAMS  PREVIOUS    03-14-23: ct of abdomen and pelvis:  1. No acute abnormality in the abdomen or pelvis. 2. Colonic diverticulosis without acute diverticulitis. Mildly distended rectum containing stool. 3. Diffuse peripheral reticulations and bilateral lower lobe traction bronchiectasis with scattered subsegmental mucous plugging, suggestive of chronic  interstitial lung disease. 4. Irregular left lower lobe lung nodule measures 6 mm. Non-contrast chest CT at 6-12 months is recommended. If the nodule is stable at time of repeat CT, then future CT at 18-24 months (from today's scan) is considered optional for low-risk patients, but is recommended for high-risk patients.  5. Aortic Atherosclerosis. Coronary artery calcifications.  LABS REVIEWED PREVIOUS     11-20-22: urine culture: klebsiella pneumoniae  01-31-23: wbc 8.6; hgb 13.4; hct 40.9; mcv 92.7 plt 208; glucose 95; bun 20; creat 0.87; k+ 3.3; na++ 138; ca 8.7; gfr >60; protein 6.1; albumin 3.4 hgb A1c 5.6; tsh 2.505; vitamin D 37.78; vitamin B 12: 207 vitamin B 6: 4.4 03-14-23: wbc 16.7; hgb 9.5; hct 28.1; mcv 96.6 plt 214; glucose 154; bun 34; creat 1.36; k+ 5.7; na++ 138; ca 9.1 gfr 37; protein 6.0 albumin 3.4 03-14-23 (#2) wbc 16.1; hgb 8.5; hct 26.3; mcv 96.0 plt  205; glucose 140; bun 38; creat 1.35; k+ 4.9; na++ 138; ca 8.9; gfr 38; protein 5.8; albumin 3.2 FOB +; blood culture: no growth 03-15-23: wbc 10.9; hgb 12.2; hct 36.9; mcv 92.7 plt 149; glucose 101; bun 25; creat 1.03 ;k+ 3.9; na++ 139; ca 8.4 gfr 52 03-17-23: wbc 8.5; hgb 11.4; hct 34.5; mcv 93.5 plt 151; glucose 99; bun 18;creat 0.89; k+ 3.6; na++ 138; ca 8.2 gfr >60; mag 2.3  03-25-23: wbc 8.4; hgb 11.8; hct 37.1 mcv 95.9 plt 245  04-29-23: vitamin B 12: 491 05-13-23: urine culture: proteus mirabilis 06-06-23: wbc 9.9; hgb 13.8; hct 41.9; mcv 90.5 plt 206; glucose 97; bun 18; creat 1.08; k+ 3.4; na++ 136; ca 8.9 gfr 49; d-dimer 0.56  CRP 4.9 07-29-23: tsh 2.759; vitamin D 34.06 08-21-23: glucose 98; bun 21; creat 0.88; k+ 3.9; na++ 138; ca 9.0; gfr >60  NO NEW LABS.   Review of Systems  Constitutional:  Negative for malaise/fatigue.  Respiratory:  Negative for cough and shortness of breath.   Cardiovascular:  Negative for chest pain, palpitations and leg swelling.  Gastrointestinal:  Negative for abdominal pain, constipation and  heartburn.  Musculoskeletal:  Negative for back pain, joint pain and myalgias.  Skin: Negative.   Neurological:  Negative for dizziness.  Psychiatric/Behavioral:  The patient is not nervous/anxious.    Physical Exam Constitutional:      General: She is not in acute distress.    Appearance: She is well-developed. She is not diaphoretic.  Neck:     Thyroid: No thyromegaly.  Cardiovascular:     Rate and Rhythm: Normal rate and regular rhythm.     Pulses: Normal pulses.     Heart sounds: Normal heart sounds.  Pulmonary:     Effort: Pulmonary effort is normal. No respiratory distress.     Breath sounds: Normal breath sounds.  Abdominal:     General: Bowel sounds are normal. There is no distension.     Palpations: Abdomen is soft.     Tenderness: There is no abdominal tenderness.  Musculoskeletal:     Cervical back: Neck supple.     Right lower leg: No edema.     Left lower leg: No edema.     Comments:  Left hemiplegia  Wears TED hose  Lymphadenopathy:     Cervical: No cervical adenopathy.  Skin:    General: Skin is warm and dry.  Neurological:     Mental Status: She is alert. Mental status is at baseline.     Comments: 07-03-23: BCAT 12/50   Psychiatric:        Mood and Affect: Mood normal.      ASSESSMENT/ PLAN:  TODAY  HOCM (hypertrophic obstructive cardiomyopathy) Interstitial lung disease Left hemiplegia  Will lower amantadine to every other day for 2 weeks then stop Will continue current plan of care Will continue to monitor her status.   Time spent with patient:  40 minutes: medications; plano of care; dietary     Synthia Innocent NP [facility]  call [phone]

## 2023-10-18 ENCOUNTER — Other Ambulatory Visit (HOSPITAL_COMMUNITY)
Admission: RE | Admit: 2023-10-18 | Discharge: 2023-10-18 | Disposition: A | Discharge status: Home / Self Care | Payer: Medicare Other | Source: Skilled Nursing Facility | Attending: Adult Health | Admitting: Adult Health

## 2023-10-18 DIAGNOSIS — N1831 Chronic kidney disease, stage 3a: Secondary | ICD-10-CM | POA: Diagnosis present

## 2023-10-18 LAB — BASIC METABOLIC PANEL
Anion gap: 10 (ref 5–15)
BUN: 30 mg/dL — ABNORMAL HIGH (ref 8–23)
CO2: 25 mmol/L (ref 22–32)
Calcium: 9.6 mg/dL (ref 8.9–10.3)
Chloride: 106 mmol/L (ref 98–111)
Creatinine, Ser: 1.04 mg/dL — ABNORMAL HIGH (ref 0.44–1.00)
GFR, Estimated: 51 mL/min — ABNORMAL LOW (ref >60)
Glucose, Bld: 107 mg/dL — ABNORMAL HIGH (ref 70–99)
Potassium: 4.3 mmol/L (ref 3.5–5.1)
Sodium: 141 mmol/L (ref 135–145)

## 2023-11-06 ENCOUNTER — Non-Acute Institutional Stay (SKILLED_NURSING_FACILITY): Payer: Medicare Other | Admitting: Adult Health

## 2023-11-06 ENCOUNTER — Encounter: Payer: Self-pay | Admitting: Adult Health

## 2023-11-06 DIAGNOSIS — Z8673 Personal history of transient ischemic attack (TIA), and cerebral infarction without residual deficits: Secondary | ICD-10-CM

## 2023-11-06 DIAGNOSIS — I700 Atherosclerosis of aorta: Secondary | ICD-10-CM

## 2023-11-06 DIAGNOSIS — G8929 Other chronic pain: Secondary | ICD-10-CM

## 2023-11-06 DIAGNOSIS — G8194 Hemiplegia, unspecified affecting left nondominant side: Secondary | ICD-10-CM

## 2023-11-06 NOTE — Progress Notes (Signed)
Location:  [facility] Room Number: 138 Place of Service:  SNF (31)   CODE STATUS: dnr   Allergies  Allergen Reactions   Hydrochlorothiazide     05/29/2021 K+ 2.6 Other reaction(s): Other (See Comments) 05/29/2021 K+ 2.6   Codeine Nausea Only    Chief Complaint  Patient presents with   Medical Management of Chronic Issues        Thoracic aortic atherosclerosis      History of CVA / left hemiplegia:    Generalized chronic pain     HPI:  She is a 89 year old long term resident of this facility being seen for the management of her chronic illnesses: Thoracic aortic atherosclerosis      History of CVA / left hemiplegia:    Generalized chronic pain. There are no reports of uncontrolled pain. She continues to get out of bed daily; her weight remains stable.   Past Medical History:  Diagnosis Date   Decreased sense of smell    and taste-negative CT scan-2011   Depression    Essential hypertension    GERD (gastroesophageal reflux disease)    Heart murmur    heard first time 4/12   History of mammogram    2010 and she does not want to repeat them anymore   HOCM (hypertrophic obstructive cardiomyopathy) (HCC)    a. Dx by echo 05/2014.   Hypothyroidism    IBS (irritable bowel syndrome)    Impaired fasting glucose    Low back pain    Lumbar radiculopathy    with negative MRI (1991)   Postmenopausal    with osteopenia, bisphosphonates 1/05-1/11, improved osteopenia 4/12-repeat planned late 2015   Shingles    2015   Thyroid nodule    Vitamin D deficiency    repleted on weekly vitamin D 16109 iu    Past Surgical History:  Procedure Laterality Date   ABDOMINAL HYSTERECTOMY     BREAST BIOPSY     x2   CATARACT EXTRACTION     removal with IOL bilateral   GLAUCOMA SURGERY     laser   skin cancer removal     THYROIDECTOMY     subtotal    Social History   Socioeconomic History   Marital status: Married    Spouse name: Not on file   Number of  children: Not on file   Years of education: Not on file   Highest education level: Not on file  Occupational History   Not on file  Tobacco Use   Smoking status: Never   Smokeless tobacco: Never  Vaping Use   Vaping status: Never Used  Substance and Sexual Activity   Alcohol use: No    Alcohol/week: 0.0 standard drinks of alcohol   Drug use: Never   Sexual activity: Not on file  Other Topics Concern   Not on file  Social History Narrative   Not on file   Social Drivers of Health   Financial Resource Strain: Not on file  Food Insecurity: No Food Insecurity (03/14/2023)   Hunger Vital Sign    Worried About Running Out of Food in the Last Year: Never true    Ran Out of Food in the Last Year: Never true  Transportation Needs: No Transportation Needs (03/14/2023)   PRAPARE - Administrator, Civil Service (Medical): No    Lack of Transportation (Non-Medical): No  Physical Activity: Not on file  Stress: Not on file  Social  Connections: Not on file  Intimate Partner Violence: Not At Risk (03/14/2023)   Humiliation, Afraid, Rape, and Kick questionnaire    Fear of Current or Ex-Partner: No    Emotionally Abused: No    Physically Abused: No    Sexually Abused: No   Family History  Problem Relation Age of Onset   Breast cancer Mother    Hypertension Father    CVA Father    Heart failure Sister    CAD Sister    Kidney disease Sister    COPD Brother    Lung cancer Brother    CAD Brother    Diabetes Brother    Stroke Father    Heart attack Neg Hx       VITAL SIGNS BP 135/78   Pulse 83   Temp 97.9 F (36.6 C)   Resp 20   Ht 5\' 2"  (1.575 m)   Wt 125 lb 6.4 oz (56.9 kg)   SpO2 97%   BMI 22.94 kg/m   Outpatient Encounter Medications as of 11/06/2023  Medication Sig   acetaminophen (TYLENOL) 325 MG tablet Take 650 mg by mouth every 6 (six) hours.   amantadine (SYMMETREL) 50 MG/5ML solution Take 50 mg by mouth daily.   Cholecalciferol (VITAMIN D-3) 25 MCG  (1000 UT) CAPS Take 1 capsule by mouth daily.   cyanocobalamin (VITAMIN B12) 1000 MCG tablet Take 2,000 mcg by mouth daily.   feeding supplement (ENSURE ENLIVE / ENSURE PLUS) LIQD Take 237 mLs by mouth 2 (two) times daily between meals.   levothyroxine (SYNTHROID) 50 MCG tablet Take 50 mcg by mouth daily before breakfast.   lisinopril (ZESTRIL) 5 MG tablet Take 5 mg by mouth daily.   magnesium hydroxide (MILK OF MAGNESIA) 400 MG/5ML suspension Take 30 mLs by mouth daily as needed for mild constipation.   memantine (NAMENDA XR) 28 MG CP24 24 hr capsule Take 28 mg by mouth daily.   Menthol, Topical Analgesic, (BIOFREEZE) 4 % GEL Apply 1 application  topically 3 (three) times daily as needed.   omeprazole (PRILOSEC) 20 MG capsule Take 20 mg by mouth 2 (two) times daily.   oxymetazoline (AFRIN) 0.05 % nasal spray Place 1 spray into both nostrils daily as needed for congestion.   Phenylephrine HCl (PREPARATION H RE) Place 1 suppository rectally in the morning and at bedtime.   polyethylene glycol (MIRALAX / GLYCOLAX) 17 g packet Take 17 g by mouth daily.   sennosides-docusate sodium (SENOKOT-S) 8.6-50 MG tablet Take 1 tablet by mouth daily.   sertraline (ZOLOFT) 50 MG tablet Take 50 mg by mouth daily.   sodium chloride (OCEAN) 0.65 % SOLN nasal spray Place 1 spray into both nostrils 3 (three) times daily. And as needed   No facility-administered encounter medications on file as of 11/06/2023.     SIGNIFICANT DIAGNOSTIC EXAMS  PREVIOUS    03-14-23: ct of abdomen and pelvis:  1. No acute abnormality in the abdomen or pelvis. 2. Colonic diverticulosis without acute diverticulitis. Mildly distended rectum containing stool. 3. Diffuse peripheral reticulations and bilateral lower lobe traction bronchiectasis with scattered subsegmental mucous plugging, suggestive of chronic interstitial lung disease. 4. Irregular left lower lobe lung nodule measures 6 mm. Non-contrast chest CT at 6-12 months is  recommended. If the nodule is stable at time of repeat CT, then future CT at 18-24 months (from today's scan) is considered optional for low-risk patients, but is recommended for high-risk patients.  5. Aortic Atherosclerosis. Coronary artery calcifications.   LABS REVIEWED  PREVIOUS     11-20-22: urine culture: klebsiella pneumoniae  01-31-23: wbc 8.6; hgb 13.4; hct 40.9; mcv 92.7 plt 208; glucose 95; bun 20; creat 0.87; k+ 3.3; na++ 138; ca 8.7; gfr >60; protein 6.1; albumin 3.4 hgb A1c 5.6; tsh 2.505; vitamin D 37.78; vitamin B 12: 207 vitamin B 6: 4.4 03-14-23: wbc 16.7; hgb 9.5; hct 28.1; mcv 96.6 plt 214; glucose 154; bun 34; creat 1.36; k+ 5.7; na++ 138; ca 9.1 gfr 37; protein 6.0 albumin 3.4 03-14-23 (#2) wbc 16.1; hgb 8.5; hct 26.3; mcv 96.0 plt  205; glucose 140; bun 38; creat 1.35; k+ 4.9; na++ 138; ca 8.9; gfr 38; protein 5.8; albumin 3.2 FOB +; blood culture: no growth 03-15-23: wbc 10.9; hgb 12.2; hct 36.9; mcv 92.7 plt 149; glucose 101; bun 25; creat 1.03 ;k+ 3.9; na++ 139; ca 8.4 gfr 52 03-17-23: wbc 8.5; hgb 11.4; hct 34.5; mcv 93.5 plt 151; glucose 99; bun 18;creat 0.89; k+ 3.6; na++ 138; ca 8.2 gfr >60; mag 2.3  03-25-23: wbc 8.4; hgb 11.8; hct 37.1 mcv 95.9 plt 245  04-29-23: vitamin B 12: 491 05-13-23: urine culture: proteus mirabilis 06-06-23: wbc 9.9; hgb 13.8; hct 41.9; mcv 90.5 plt 206; glucose 97; bun 18; creat 1.08; k+ 3.4; na++ 136; ca 8.9 gfr 49; d-dimer 0.56  CRP 4.9 07-29-23: tsh 2.759; vitamin D 34.06 08-21-23: glucose 98; bun 21; creat 0.88; k+ 3.9; na++ 138; ca 9.0; gfr >60  TODAY  10-18-23: glucose 107; bun 30; creat 1.04; k+ 4.3; na++ 141; ca 9.6; gfr 51   Review of Systems  Constitutional:  Negative for malaise/fatigue.  Respiratory:  Negative for cough and shortness of breath.   Cardiovascular:  Negative for chest pain, palpitations and leg swelling.  Gastrointestinal:  Negative for abdominal pain, constipation and heartburn.  Musculoskeletal:  Negative for back  pain, joint pain and myalgias.  Skin: Negative.   Neurological:  Negative for dizziness.  Psychiatric/Behavioral:  The patient is not nervous/anxious.    Physical Exam Constitutional:      General: She is not in acute distress.    Appearance: She is well-developed. She is not diaphoretic.  Neck:     Thyroid: No thyromegaly.  Cardiovascular:     Rate and Rhythm: Normal rate and regular rhythm.     Pulses: Normal pulses.     Heart sounds: Normal heart sounds.  Pulmonary:     Effort: Pulmonary effort is normal. No respiratory distress.     Breath sounds: Normal breath sounds.  Abdominal:     General: Bowel sounds are normal. There is no distension.     Palpations: Abdomen is soft.     Tenderness: There is no abdominal tenderness.  Musculoskeletal:     Cervical back: Neck supple.     Right lower leg: No edema.     Left lower leg: No edema.     Comments:  Left hemiplegia    Lymphadenopathy:     Cervical: No cervical adenopathy.  Skin:    General: Skin is warm and dry.  Neurological:     Mental Status: She is alert. Mental status is at baseline.     Comments: 07-03-23: BCAT 12/50  Psychiatric:        Mood and Affect: Mood normal.      ASSESSMENT/ PLAN:  TODAY  Thoracic aortic atherosclerosis (cxr 12-15-14) is not on statin due to advanced age.   2. History of CVA / left hemiplegia: is off asa due to GI bleed  3.  Generalized chronic pain: will continue tylenol 650 mg every 6 hours   PREVIOUS   4. Dysphagia: no indications of aspiration present: is on thin liquids  5. Stage 3a chronic kidney disease bun 21; creat 0.88 gfr >60   6. Gastrointestinal hemorrhage associated with diverticulosis:(may 2024) hgb is presently stable the bleeding felt more than likely from diverticulosis.   7. Interstitial lung disease: is stable  8. HOCM (hypertrophic obstructive cardiomyopathy) will monitor   9. Vascular dementia without behavioral disturbance/neurocognitive  deficits/cerebral atrophy: weight is 125 pounds; will continue  namenda xr 28 mg daily;  aricept has been stopped due to weight loss   10. Somnolence post stroke will continue amantadine  50 mg daily and will monitor   11. Hypoalbuminemia due to protein calorie malnutrition: albumin 3.4 will continue supplements as directed  12. Vitamin B 12 deficiency: level 491 will continue 2000 mcg daily   13. Vitamin D deficiency: level 34.06; will continue 1000 units daily   14. Essential hypertension: b/p 135/78;  will continue  lisinopril 5 mg daily   15. Postoperative hypothyroidism: tsh 2.759 will continue synthroid 50 mcg daily   16. Mixed hyperlipidemia: is off statin due to advanced age.   17. Psychosis in the elderly without behavioral disturbance: is off seroquel   18. Depression recurrent: will continue zoloft 50 mg daily   19. GERD without esophagitis: will continue prilosec 20 mg twice daily   20. Chronic constipation: will continue miralax daily     Synthia Innocent NP [facility]  call [phone]

## 2023-11-07 DIAGNOSIS — G8929 Other chronic pain: Secondary | ICD-10-CM | POA: Insufficient documentation

## 2023-12-09 ENCOUNTER — Encounter: Payer: Self-pay | Admitting: Adult Health

## 2023-12-09 ENCOUNTER — Non-Acute Institutional Stay (SKILLED_NURSING_FACILITY): Payer: Medicare Other | Admitting: Adult Health

## 2023-12-09 DIAGNOSIS — N1831 Chronic kidney disease, stage 3a: Secondary | ICD-10-CM | POA: Diagnosis not present

## 2023-12-09 DIAGNOSIS — K219 Gastro-esophageal reflux disease without esophagitis: Secondary | ICD-10-CM | POA: Diagnosis not present

## 2023-12-09 DIAGNOSIS — I69391 Dysphagia following cerebral infarction: Secondary | ICD-10-CM

## 2023-12-09 DIAGNOSIS — K5791 Diverticulosis of intestine, part unspecified, without perforation or abscess with bleeding: Secondary | ICD-10-CM | POA: Diagnosis not present

## 2023-12-09 NOTE — Progress Notes (Signed)
 Location:  [facility] Room Number: [id number] Place of Service:  SNF (31)   CODE STATUS: dnr   Allergies  Allergen Reactions   Hydrochlorothiazide     05/29/2021 K+ 2.6 Other reaction(s): Other (See Comments) 05/29/2021 K+ 2.6   Codeine Nausea Only    Chief Complaint  Patient presents with   Medical Management of Chronic Issues         Dysphagia: Stage 3a chronic kidney disease: Gastrointestinal hemorrhage associated with diverticulosis/GERD without esophagitis:     HPI:  She is a 89 year old long term resident of this facility being seen for the management of her chronic illnesses: Dysphagia: Stage 3a chronic kidney disease: Gastrointestinal hemorrhage associated with diverticulosis/GERD without esophagitis:there are no reports of uncontrolled pain. Her weight is stable; there are no indications of aspiration present.   Past Medical History:  Diagnosis Date   Decreased sense of smell    and taste-negative CT scan-2011   Depression    Essential hypertension    GERD (gastroesophageal reflux disease)    Heart murmur    heard first time 4/12   History of mammogram    2010 and she does not want to repeat them anymore   HOCM (hypertrophic obstructive cardiomyopathy) (HCC)    a. Dx by echo 05/2014.   Hypothyroidism    IBS (irritable bowel syndrome)    Impaired fasting glucose    Low back pain    Lumbar radiculopathy    with negative MRI (1991)   Postmenopausal    with osteopenia, bisphosphonates 1/05-1/11, improved osteopenia 4/12-repeat planned late 2015   Shingles    2015   Thyroid nodule    Vitamin D deficiency    repleted on weekly vitamin D 16109 iu    Past Surgical History:  Procedure Laterality Date   ABDOMINAL HYSTERECTOMY     BREAST BIOPSY     x2   CATARACT EXTRACTION     removal with IOL bilateral   GLAUCOMA SURGERY     laser   skin cancer removal     THYROIDECTOMY     subtotal    Social History   Socioeconomic History    Marital status: Married    Spouse name: Not on file   Number of children: Not on file   Years of education: Not on file   Highest education level: Not on file  Occupational History   Not on file  Tobacco Use   Smoking status: Never   Smokeless tobacco: Never  Vaping Use   Vaping status: Never Used  Substance and Sexual Activity   Alcohol use: No    Alcohol/week: 0.0 standard drinks of alcohol   Drug use: Never   Sexual activity: Not on file  Other Topics Concern   Not on file  Social History Narrative   Not on file   Social Drivers of Health   Financial Resource Strain: Not on file  Food Insecurity: No Food Insecurity (03/14/2023)   Hunger Vital Sign    Worried About Running Out of Food in the Last Year: Never true    Ran Out of Food in the Last Year: Never true  Transportation Needs: No Transportation Needs (03/14/2023)   PRAPARE - Administrator, Civil Service (Medical): No    Lack of Transportation (Non-Medical): No  Physical Activity: Not on file  Stress: Not on file  Social Connections: Not on file  Intimate Partner Violence: Not At Risk (03/14/2023)   Humiliation,  Afraid, Rape, and Kick questionnaire    Fear of Current or Ex-Partner: No    Emotionally Abused: No    Physically Abused: No    Sexually Abused: No   Family History  Problem Relation Age of Onset   Breast cancer Mother    Hypertension Father    CVA Father    Heart failure Sister    CAD Sister    Kidney disease Sister    COPD Brother    Lung cancer Brother    CAD Brother    Diabetes Brother    Stroke Father    Heart attack Neg Hx       VITAL SIGNS BP 138/80   Pulse 84   Temp 98 F (36.7 C)   Resp 20   Ht 5\' 2"  (1.575 m)   Wt 126 lb 6.4 oz (57.3 kg)   SpO2 98%   BMI 23.12 kg/m   Outpatient Encounter Medications as of 12/09/2023  Medication Sig   sertraline (ZOLOFT) 25 MG tablet Take 25 mg by mouth daily.   acetaminophen (TYLENOL) 325 MG tablet Take 650 mg by mouth every  6 (six) hours.   Cholecalciferol (VITAMIN D-3) 25 MCG (1000 UT) CAPS Take 1 capsule by mouth daily.   cyanocobalamin (VITAMIN B12) 1000 MCG tablet Take 2,000 mcg by mouth daily.   feeding supplement (ENSURE ENLIVE / ENSURE PLUS) LIQD Take 237 mLs by mouth 2 (two) times daily between meals.   levothyroxine (SYNTHROID) 50 MCG tablet Take 50 mcg by mouth daily before breakfast.   lisinopril (ZESTRIL) 5 MG tablet Take 5 mg by mouth daily.   magnesium hydroxide (MILK OF MAGNESIA) 400 MG/5ML suspension Take 30 mLs by mouth daily as needed for mild constipation.   memantine (NAMENDA XR) 28 MG CP24 24 hr capsule Take 28 mg by mouth daily.   Menthol, Topical Analgesic, (BIOFREEZE) 4 % GEL Apply 1 application  topically 3 (three) times daily as needed.   omeprazole (PRILOSEC) 20 MG capsule Take 20 mg by mouth 2 (two) times daily.   oxymetazoline (AFRIN) 0.05 % nasal spray Place 1 spray into both nostrils daily as needed for congestion.   Phenylephrine HCl (PREPARATION H RE) Place 1 suppository rectally in the morning and at bedtime.   polyethylene glycol (MIRALAX / GLYCOLAX) 17 g packet Take 17 g by mouth daily.   sennosides-docusate sodium (SENOKOT-S) 8.6-50 MG tablet Take 1 tablet by mouth daily.   sodium chloride (OCEAN) 0.65 % SOLN nasal spray Place 1 spray into both nostrils 3 (three) times daily. And as needed   [DISCONTINUED] amantadine (SYMMETREL) 50 MG/5ML solution Take 50 mg by mouth daily.   [DISCONTINUED] sertraline (ZOLOFT) 50 MG tablet Take 50 mg by mouth daily.   No facility-administered encounter medications on file as of 12/09/2023.     SIGNIFICANT DIAGNOSTIC EXAMS  PREVIOUS    03-14-23: ct of abdomen and pelvis:  1. No acute abnormality in the abdomen or pelvis. 2. Colonic diverticulosis without acute diverticulitis. Mildly distended rectum containing stool. 3. Diffuse peripheral reticulations and bilateral lower lobe traction bronchiectasis with scattered subsegmental mucous  plugging, suggestive of chronic interstitial lung disease. 4. Irregular left lower lobe lung nodule measures 6 mm. Non-contrast chest CT at 6-12 months is recommended. If the nodule is stable at time of repeat CT, then future CT at 18-24 months (from today's scan) is considered optional for low-risk patients, but is recommended for high-risk patients.  5. Aortic Atherosclerosis. Coronary artery calcifications.   LABS REVIEWED  PREVIOUS     01-31-23: wbc 8.6; hgb 13.4; hct 40.9; mcv 92.7 plt 208; glucose 95; bun 20; creat 0.87; k+ 3.3; na++ 138; ca 8.7; gfr >60; protein 6.1; albumin 3.4 hgb A1c 5.6; tsh 2.505; vitamin D 37.78; vitamin B 12: 207 vitamin B 6: 4.4 03-14-23: wbc 16.7; hgb 9.5; hct 28.1; mcv 96.6 plt 214; glucose 154; bun 34; creat 1.36; k+ 5.7; na++ 138; ca 9.1 gfr 37; protein 6.0 albumin 3.4 03-14-23 (#2) wbc 16.1; hgb 8.5; hct 26.3; mcv 96.0 plt  205; glucose 140; bun 38; creat 1.35; k+ 4.9; na++ 138; ca 8.9; gfr 38; protein 5.8; albumin 3.2 FOB +; blood culture: no growth 03-15-23: wbc 10.9; hgb 12.2; hct 36.9; mcv 92.7 plt 149; glucose 101; bun 25; creat 1.03 ;k+ 3.9; na++ 139; ca 8.4 gfr 52 03-17-23: wbc 8.5; hgb 11.4; hct 34.5; mcv 93.5 plt 151; glucose 99; bun 18;creat 0.89; k+ 3.6; na++ 138; ca 8.2 gfr >60; mag 2.3  03-25-23: wbc 8.4; hgb 11.8; hct 37.1 mcv 95.9 plt 245  04-29-23: vitamin B 12: 491 05-13-23: urine culture: proteus mirabilis 06-06-23: wbc 9.9; hgb 13.8; hct 41.9; mcv 90.5 plt 206; glucose 97; bun 18; creat 1.08; k+ 3.4; na++ 136; ca 8.9 gfr 49; d-dimer 0.56  CRP 4.9 07-29-23: tsh 2.759; vitamin D 34.06 08-21-23: glucose 98; bun 21; creat 0.88; k+ 3.9; na++ 138; ca 9.0; gfr >60 10-18-23: glucose 107; bun 30; creat 1.04; k+ 4.3; na++ 141; ca 9.6; gfr 51   NO NEW LABS   Review of Systems  Constitutional:  Negative for malaise/fatigue.  Respiratory:  Negative for cough and shortness of breath.   Cardiovascular:  Negative for chest pain, palpitations and leg swelling.   Gastrointestinal:  Negative for abdominal pain, constipation and heartburn.  Musculoskeletal:  Negative for back pain, joint pain and myalgias.  Skin: Negative.   Neurological:  Negative for dizziness.  Psychiatric/Behavioral:  The patient is not nervous/anxious.    Physical Exam Constitutional:      General: She is not in acute distress.    Appearance: She is well-developed. She is not diaphoretic.  Neck:     Thyroid: No thyromegaly.  Cardiovascular:     Rate and Rhythm: Normal rate and regular rhythm.     Pulses: Normal pulses.     Heart sounds: Normal heart sounds.  Pulmonary:     Effort: Pulmonary effort is normal. No respiratory distress.     Breath sounds: Normal breath sounds.  Abdominal:     General: Bowel sounds are normal. There is no distension.     Palpations: Abdomen is soft.     Tenderness: There is no abdominal tenderness.  Musculoskeletal:     Cervical back: Neck supple.     Right lower leg: No edema.     Left lower leg: No edema.     Comments:  Left hemiplegia   Lymphadenopathy:     Cervical: No cervical adenopathy.  Skin:    General: Skin is warm and dry.  Neurological:     Mental Status: She is alert. Mental status is at baseline.     Comments: : BCAT 12/50   Psychiatric:        Mood and Affect: Mood normal.    ASSESSMENT/ PLAN:  TODAY  Dysphagia: no indications of aspiration present; will continue thin liquids  2. Stage 3a chronic kidney disease: bun 21; creat 0.88; gfr >60  3. Gastrointestinal hemorrhage associated with diverticulosis(May 2024)/GERD without esophagitis: hgb stable; the bleeding  felt to be related to diverticulosis. Will continue prilosec 20 mg twice daily   PREVIOUS   4. Interstitial lung disease: is stable  5. HOCM (hypertrophic obstructive cardiomyopathy) will monitor   6. Vascular dementia without behavioral disturbance/neurocognitive deficits/cerebral atrophy: weight is 126 pounds; will continue  namenda xr 28 mg  daily;  aricept had been stopped due to weight loss   7. Somnolence post stroke will continue to  monitor   8. Hypoalbuminemia due to protein calorie malnutrition: albumin 3.4 will continue supplements as directed  9. Vitamin B 12 deficiency: level 491 will continue 2000 mcg daily   10. Vitamin D deficiency: level 34.06; will continue 1000 units daily   11. Essential hypertension: b/p 138/80;  will continue  lisinopril 5 mg daily   12. Postoperative hypothyroidism: tsh 2.759 will continue synthroid 50 mcg daily   13. Mixed hyperlipidemia: is off statin due to advanced age.   14. Psychosis in the elderly without behavioral disturbance: is off seroquel   15. Depression recurrent: will continue zoloft 25 mg daily   16. Chronic constipation: will continue miralax daily   17. Thoracic aortic atherosclerosis (cxr 12-15-14) is not on statin due to advanced age.   18. History of CVA / left hemiplegia: is off asa due to GI bleed  19. Generalized chronic pain: will continue tylenol 650 mg every 6 hours    Synthia Innocent NP [facility]   call [phone]

## 2023-12-26 ENCOUNTER — Encounter: Payer: Self-pay | Admitting: Internal Medicine

## 2023-12-26 ENCOUNTER — Non-Acute Institutional Stay (SKILLED_NURSING_FACILITY): Payer: Self-pay | Admitting: Internal Medicine

## 2023-12-26 DIAGNOSIS — I10 Essential (primary) hypertension: Secondary | ICD-10-CM | POA: Diagnosis not present

## 2023-12-26 DIAGNOSIS — R29818 Other symptoms and signs involving the nervous system: Secondary | ICD-10-CM | POA: Diagnosis not present

## 2023-12-26 DIAGNOSIS — Z8673 Personal history of transient ischemic attack (TIA), and cerebral infarction without residual deficits: Secondary | ICD-10-CM

## 2023-12-26 DIAGNOSIS — R4189 Other symptoms and signs involving cognitive functions and awareness: Secondary | ICD-10-CM

## 2023-12-26 NOTE — Progress Notes (Signed)
 NURSING HOME LOCATION:  [facility] ROOM NUMBER: [id number]  CODE STATUS: DNR  PCP: Sharee Holster, NP   This is a nursing facility follow up visit for of chronic medical diagnoses to document compliance with Regulation 483.30 (c) in [employer] Long Term Care Survey Manual Phase 2 which mandates caregiver visit ( visits can alternate among physician, PA or NP as per statutes) within 10 days of 30 days / 60 days/ 90 days post admission to SNF date  .  Interim medical record and care since last SNF visit was updated with review of diagnostic studies and change in clinical status since last visit were documented.  HPI: She is a permanent resident of this facility with diagnoses of essential HTN,hypothyroidism,dyslipidemia,GERD,PMH CVA with L hemiparesis, & protein / caloric malnutrition. Most recent labs were completed 10/18/2023.  There had been slight progression in her CKD with creatinine rising from 0.88 up to 1.04 and GFR dropping from greater than 60 to 51 indicating CKD stage IIIa.  Review of systems: Dementia invalidated responses. All responses were "no."  Typically she tells me the left upper extremity paralysis was related to falling out of a tree as a child.  Today she could not remember how it happened.  She also initially denied having any female sibs until corrected by her daughters.  The daughter states she does complain of tingling when they massage the left upper extremity. Although she denies edema; her daughter state that she will have intermittent edema of the left lower extremity.  The patient denies paroxysmal nocturnal dyspnea or other cardiopulmonary symptoms. The daughters state that she has a skin lesion on the buttocks and inquired about any additional treatments other than the barrier cream. I stated I shall consult Troy Sine, Wound Care Nurse. Daughter states that additional funding will be required for her ongoing care . Her financial advisor has requested a  physician statement as to her mental competency and ability to handle financial affairs.  Constitutional: No fever, significant weight change, fatigue  Eyes: No redness, discharge, pain, vision change ENT/mouth: No nasal congestion,  purulent discharge, earache, change in hearing, sore throat  Cardiovascular: No chest pain, palpitations, paroxysmal nocturnal dyspnea,  edema  Respiratory: No cough, sputum production, hemoptysis, DOE, significant snoring, apnea   Gastrointestinal: No heartburn, dysphagia, abdominal pain, nausea /vomiting, rectal bleeding, melena, change in bowels Genitourinary: No dysuria, hematuria, pyuria, incontinence, nocturia Musculoskeletal: No joint stiffness, joint swelling, weakness, pain Dermatologic: No rash, pruritus Neurologic: No dizziness, headache, syncope, seizures, numbness Psychiatric: No significant anxiety, depression, insomnia, anorexia Endocrine: No change in hair/skin/nails, excessive thirst, excessive hunger, excessive urination  Hematologic/lymphatic: No significant bruising, lymphadenopathy, abnormal bleeding  Physical exam:  Pertinent or positive findings: As noted she is pleasantly confused and confabulating intermittently.  She has faint dry rales at the bases, greater on the right.  The right dorsalis pedis pulse is stronger than the other pulses.  She has trace-1/2+ edema at the sock line.  Limb atrophy and interosseous wasting are present.  Left upper extremity is in a sling.  The left lower extremity is weaker to opposition than the right lower extremity.  General appearance: no acute distress, increased work of breathing is present.   Lymphatic: No lymphadenopathy about the head, neck, axilla. Eyes: No conjunctival inflammation or lid edema is present. There is no scleral icterus. Ears:  External ear exam shows no significant lesions or deformities.   Nose:  External nasal examination shows no deformity or inflammation. Nasal  mucosa are pink  and moist without lesions, exudates Oral exam:  Lips and gums are healthy appearing. There is no oropharyngeal erythema or exudate. Neck:  No thyromegaly, masses, tenderness noted.    Heart:  Normal rate and regular rhythm. S1 and S2 normal without gallop, murmur, click, rub .  Lungs:  without wheezes, rhonchi, rubs. Abdomen: Bowel sounds are normal. Abdomen is soft and nontender with no organomegaly, hernias, masses. GU: Deferred  Extremities:  No cyanosis, clubbing  Neurologic exam :Balance, Rhomberg, finger to nose testing could not be completed due to clinical state Skin: Warm & dry w/o tenting. No significant visible lesions or rash.Wound Care Nurse to assess buttocks lesion.  See summary under each active problem in the Problem List with associated updated therapeutic plan

## 2023-12-26 NOTE — Assessment & Plan Note (Signed)
 Neurocognitive deficits clinically have progressed. The L hemiparesis is unchanged.

## 2023-12-26 NOTE — Assessment & Plan Note (Addendum)
 Epic & Matrix blood pressure readings reviewed; rarely she will have isolated systolic hypertension.  Range is 114/50-155/87, both outliers.Average will be verified and the low-dose ACE inhibitor adjusted if there is persistent hypertension.

## 2023-12-26 NOTE — Patient Instructions (Signed)
 See assessment and plan under each diagnosis in the problem list and acutely for this visit

## 2023-12-26 NOTE — Assessment & Plan Note (Signed)
 A copy of this assessment will be provided to daughters to facilitate management of financial affairs .Resident is not competent to handle such.

## 2023-12-30 ENCOUNTER — Other Ambulatory Visit (HOSPITAL_COMMUNITY)
Admission: RE | Admit: 2023-12-30 | Discharge: 2023-12-30 | Disposition: A | Discharge status: Home / Self Care | Source: Skilled Nursing Facility | Attending: Adult Health | Admitting: Adult Health

## 2023-12-30 DIAGNOSIS — N1831 Chronic kidney disease, stage 3a: Secondary | ICD-10-CM | POA: Insufficient documentation

## 2023-12-30 LAB — BASIC METABOLIC PANEL
Anion gap: 9 (ref 5–15)
BUN: 29 mg/dL — ABNORMAL HIGH (ref 8–23)
CO2: 28 mmol/L (ref 22–32)
Calcium: 9.7 mg/dL (ref 8.9–10.3)
Chloride: 102 mmol/L (ref 98–111)
Creatinine, Ser: 0.9 mg/dL (ref 0.44–1.00)
GFR, Estimated: >60 mL/min (ref >60)
Glucose, Bld: 141 mg/dL — ABNORMAL HIGH (ref 70–99)
Potassium: 5.2 mmol/L — ABNORMAL HIGH (ref 3.5–5.1)
Sodium: 139 mmol/L (ref 135–145)

## 2023-12-31 ENCOUNTER — Encounter: Payer: Self-pay | Admitting: Adult Health

## 2023-12-31 ENCOUNTER — Non-Acute Institutional Stay (SKILLED_NURSING_FACILITY): Payer: Self-pay | Admitting: Adult Health

## 2023-12-31 DIAGNOSIS — I10 Essential (primary) hypertension: Secondary | ICD-10-CM | POA: Diagnosis not present

## 2023-12-31 DIAGNOSIS — E875 Hyperkalemia: Secondary | ICD-10-CM

## 2023-12-31 NOTE — Progress Notes (Unsigned)
 Location:  [facility] Room Number: [id number] Place of Service:  SNF (31)   CODE STATUS: dnr   Allergies  Allergen Reactions   Ace Inhibitors     Hyperkalemia    Hydrochlorothiazide     05/29/2021 K+ 2.6 Other reaction(s): Other (See Comments) 05/29/2021 K+ 2.6   Codeine Nausea Only    Chief Complaint  Patient presents with   Acute Visit    Follow up labs     HPI:  She continues to have periodic hyperkalemic episodes. She is taking lisinopril 5 mg daily for hypertension. Her renal function is without significant change. There are no reports of headaches; visual changes.   Past Medical History:  Diagnosis Date   Decreased sense of smell    and taste-negative CT scan-2011   Depression    Essential hypertension    GERD (gastroesophageal reflux disease)    Heart murmur    heard first time 4/12   History of mammogram    2010 and she does not want to repeat them anymore   HOCM (hypertrophic obstructive cardiomyopathy) (HCC)    a. Dx by echo 05/2014.   Hypothyroidism    IBS (irritable bowel syndrome)    Impaired fasting glucose    Low back pain    Lumbar radiculopathy    with negative MRI (1991)   Postmenopausal    with osteopenia, bisphosphonates 1/05-1/11, improved osteopenia 4/12-repeat planned late 2015   Shingles    2015   Thyroid nodule    Vitamin D deficiency    repleted on weekly vitamin D 19147 iu    Past Surgical History:  Procedure Laterality Date   ABDOMINAL HYSTERECTOMY     BREAST BIOPSY     x2   CATARACT EXTRACTION     removal with IOL bilateral   GLAUCOMA SURGERY     laser   skin cancer removal     THYROIDECTOMY     subtotal    Social History   Socioeconomic History   Marital status: Married    Spouse name: Not on file   Number of children: Not on file   Years of education: Not on file   Highest education level: Not on file  Occupational History   Not on file  Tobacco Use   Smoking status: Never   Smokeless  tobacco: Never  Vaping Use   Vaping status: Never Used  Substance and Sexual Activity   Alcohol use: No    Alcohol/week: 0.0 standard drinks of alcohol   Drug use: Never   Sexual activity: Not on file  Other Topics Concern   Not on file  Social History Narrative   Not on file   Social Drivers of Health   Financial Resource Strain: Not on file  Food Insecurity: No Food Insecurity (03/14/2023)   Hunger Vital Sign    Worried About Running Out of Food in the Last Year: Never true    Ran Out of Food in the Last Year: Never true  Transportation Needs: No Transportation Needs (03/14/2023)   PRAPARE - Administrator, Civil Service (Medical): No    Lack of Transportation (Non-Medical): No  Physical Activity: Not on file  Stress: Not on file  Social Connections: Not on file  Intimate Partner Violence: Not At Risk (03/14/2023)   Humiliation, Afraid, Rape, and Kick questionnaire    Fear of Current or Ex-Partner: No    Emotionally Abused: No    Physically Abused: No  Sexually Abused: No   Family History  Problem Relation Age of Onset   Breast cancer Mother    Hypertension Father    CVA Father    Heart failure Sister    CAD Sister    Kidney disease Sister    COPD Brother    Lung cancer Brother    CAD Brother    Diabetes Brother    Stroke Father    Heart attack Neg Hx       VITAL SIGNS BP 136/70   Pulse 82   Temp (!) 97.4 F (36.3 C)   Resp 20   Ht 5\' 2"  (1.575 m)   Wt 126 lb 12.8 oz (57.5 kg)   SpO2 97%   BMI 23.19 kg/m   Outpatient Encounter Medications as of 12/31/2023  Medication Sig   acetaminophen (TYLENOL) 325 MG tablet Take 650 mg by mouth every 6 (six) hours.   Cholecalciferol (VITAMIN D-3) 25 MCG (1000 UT) CAPS Take 1 capsule by mouth daily.   cyanocobalamin (VITAMIN B12) 1000 MCG tablet Take 2,000 mcg by mouth daily.   feeding supplement (ENSURE ENLIVE / ENSURE PLUS) LIQD Take 237 mLs by mouth 2 (two) times daily between meals.    levothyroxine (SYNTHROID) 50 MCG tablet Take 50 mcg by mouth daily before breakfast.   lisinopril (ZESTRIL) 5 MG tablet Take 5 mg by mouth daily.   magnesium hydroxide (MILK OF MAGNESIA) 400 MG/5ML suspension Take 30 mLs by mouth daily as needed for mild constipation.   memantine (NAMENDA XR) 28 MG CP24 24 hr capsule Take 28 mg by mouth daily.   Menthol, Topical Analgesic, (BIOFREEZE) 4 % GEL Apply 1 application  topically 3 (three) times daily as needed.   omeprazole (PRILOSEC) 20 MG capsule Take 20 mg by mouth 2 (two) times daily.   oxymetazoline (AFRIN) 0.05 % nasal spray Place 1 spray into both nostrils daily as needed for congestion.   Phenylephrine HCl (PREPARATION H RE) Place 1 suppository rectally in the morning and at bedtime.   polyethylene glycol (MIRALAX / GLYCOLAX) 17 g packet Take 17 g by mouth daily.   sennosides-docusate sodium (SENOKOT-S) 8.6-50 MG tablet Take 1 tablet by mouth daily.   sertraline (ZOLOFT) 25 MG tablet Take 25 mg by mouth daily.   sodium chloride (OCEAN) 0.65 % SOLN nasal spray Place 1 spray into both nostrils 3 (three) times daily. And as needed   No facility-administered encounter medications on file as of 12/31/2023.     SIGNIFICANT DIAGNOSTIC EXAMS  PREVIOUS    03-14-23: ct of abdomen and pelvis:  1. No acute abnormality in the abdomen or pelvis. 2. Colonic diverticulosis without acute diverticulitis. Mildly distended rectum containing stool. 3. Diffuse peripheral reticulations and bilateral lower lobe traction bronchiectasis with scattered subsegmental mucous plugging, suggestive of chronic interstitial lung disease. 4. Irregular left lower lobe lung nodule measures 6 mm. Non-contrast chest CT at 6-12 months is recommended. If the nodule is stable at time of repeat CT, then future CT at 18-24 months (from today's scan) is considered optional for low-risk patients, but is recommended for high-risk patients.  5. Aortic Atherosclerosis. Coronary artery  calcifications.   LABS REVIEWED PREVIOUS     01-31-23: wbc 8.6; hgb 13.4; hct 40.9; mcv 92.7 plt 208; glucose 95; bun 20; creat 0.87; k+ 3.3; na++ 138; ca 8.7; gfr >60; protein 6.1; albumin 3.4 hgb A1c 5.6; tsh 2.505; vitamin D 37.78; vitamin B 12: 207 vitamin B 6: 4.4 03-14-23: wbc 16.7; hgb 9.5; hct  28.1; mcv 96.6 plt 214; glucose 154; bun 34; creat 1.36; k+ 5.7; na++ 138; ca 9.1 gfr 37; protein 6.0 albumin 3.4 03-14-23 (#2) wbc 16.1; hgb 8.5; hct 26.3; mcv 96.0 plt  205; glucose 140; bun 38; creat 1.35; k+ 4.9; na++ 138; ca 8.9; gfr 38; protein 5.8; albumin 3.2 FOB +; blood culture: no growth 03-15-23: wbc 10.9; hgb 12.2; hct 36.9; mcv 92.7 plt 149; glucose 101; bun 25; creat 1.03 ;k+ 3.9; na++ 139; ca 8.4 gfr 52 03-17-23: wbc 8.5; hgb 11.4; hct 34.5; mcv 93.5 plt 151; glucose 99; bun 18;creat 0.89; k+ 3.6; na++ 138; ca 8.2 gfr >60; mag 2.3  03-25-23: wbc 8.4; hgb 11.8; hct 37.1 mcv 95.9 plt 245  04-29-23: vitamin B 12: 491 05-13-23: urine culture: proteus mirabilis 06-06-23: wbc 9.9; hgb 13.8; hct 41.9; mcv 90.5 plt 206; glucose 97; bun 18; creat 1.08; k+ 3.4; na++ 136; ca 8.9 gfr 49; d-dimer 0.56  CRP 4.9 07-29-23: tsh 2.759; vitamin D 34.06 08-21-23: glucose 98; bun 21; creat 0.88; k+ 3.9; na++ 138; ca 9.0; gfr >60 10-18-23: glucose 107; bun 30; creat 1.04; k+ 4.3; na++ 141; ca 9.6; gfr 51   TODAY  12-30-23: glucose 141; bun 29; creat 0.90; k+ 5.2; na++ 139; ca 9.7; gfr >60   Review of Systems  Constitutional:  Negative for malaise/fatigue.  Respiratory:  Negative for cough and shortness of breath.   Cardiovascular:  Negative for chest pain, palpitations and leg swelling.  Gastrointestinal:  Negative for abdominal pain, constipation and heartburn.  Musculoskeletal:  Negative for back pain, joint pain and myalgias.  Skin: Negative.   Neurological:  Negative for dizziness.  Psychiatric/Behavioral:  The patient is not nervous/anxious.    Physical Exam Constitutional:      General: She is not  in acute distress.    Appearance: She is well-developed. She is not diaphoretic.  Neck:     Thyroid: No thyromegaly.  Cardiovascular:     Rate and Rhythm: Normal rate and regular rhythm.     Pulses: Normal pulses.     Heart sounds: Normal heart sounds.  Pulmonary:     Effort: Pulmonary effort is normal. No respiratory distress.     Breath sounds: Normal breath sounds.  Abdominal:     General: Bowel sounds are normal. There is no distension.     Palpations: Abdomen is soft.     Tenderness: There is no abdominal tenderness.  Musculoskeletal:     Cervical back: Neck supple.     Right lower leg: No edema.     Left lower leg: No edema.     Comments: Left hemiplegia    Lymphadenopathy:     Cervical: No cervical adenopathy.  Skin:    General: Skin is warm and dry.  Neurological:     Mental Status: She is alert. Mental status is at baseline.     Comments: BCAT 12/50   Psychiatric:        Mood and Affect: Mood normal.      ASSESSMENT/ PLAN:  TODAY  Essential hypertension Hyperkalemia  Will stop lisinopril and will repeat labs 01-06-24    Synthia Innocent NP [facility]   call [phone]

## 2024-01-02 DIAGNOSIS — E875 Hyperkalemia: Secondary | ICD-10-CM | POA: Insufficient documentation

## 2024-01-06 ENCOUNTER — Other Ambulatory Visit (HOSPITAL_COMMUNITY)
Admission: RE | Admit: 2024-01-06 | Discharge: 2024-01-06 | Disposition: A | Discharge status: Home / Self Care | Source: Skilled Nursing Facility | Attending: Adult Health | Admitting: Adult Health

## 2024-01-06 DIAGNOSIS — I10 Essential (primary) hypertension: Secondary | ICD-10-CM | POA: Diagnosis present

## 2024-01-06 LAB — BASIC METABOLIC PANEL
Anion gap: 9 (ref 5–15)
BUN: 25 mg/dL — ABNORMAL HIGH (ref 8–23)
CO2: 26 mmol/L (ref 22–32)
Calcium: 9 mg/dL (ref 8.9–10.3)
Chloride: 104 mmol/L (ref 98–111)
Creatinine, Ser: 0.96 mg/dL (ref 0.44–1.00)
GFR, Estimated: 56 mL/min — ABNORMAL LOW (ref >60)
Glucose, Bld: 100 mg/dL — ABNORMAL HIGH (ref 70–99)
Potassium: 3.8 mmol/L (ref 3.5–5.1)
Sodium: 139 mmol/L (ref 135–145)

## 2024-01-10 ENCOUNTER — Non-Acute Institutional Stay (SKILLED_NURSING_FACILITY): Payer: Self-pay | Admitting: Adult Health

## 2024-01-10 ENCOUNTER — Encounter: Payer: Self-pay | Admitting: Adult Health

## 2024-01-10 DIAGNOSIS — J849 Interstitial pulmonary disease, unspecified: Secondary | ICD-10-CM | POA: Diagnosis not present

## 2024-01-10 DIAGNOSIS — G8194 Hemiplegia, unspecified affecting left nondominant side: Secondary | ICD-10-CM

## 2024-01-10 DIAGNOSIS — I421 Obstructive hypertrophic cardiomyopathy: Secondary | ICD-10-CM | POA: Diagnosis not present

## 2024-01-10 NOTE — Progress Notes (Signed)
 Location:  [facility] Room Number: [id number] Place of Service:  SNF (31)   CODE STATUS: dnr   Allergies  Allergen Reactions   Ace Inhibitors     Hyperkalemia    Hydrochlorothiazide     05/29/2021 K+ 2.6 Other reaction(s): Other (See Comments) 05/29/2021 K+ 2.6   Codeine Nausea Only    Chief Complaint  Patient presents with   Acute Visit    Care plan meeting     HPI:  We have come together for her care plan meeting. Family present.  BIMS 5/15 mood 6/30: not eating well; decreased energy. She is nonambulatory with one fall without injury. She requires moderate to dependent assist with her adl care. She is frequently incontinent of bladder and bowel. Dietary: regular diet; setup for meals; appetite 26-100%; weight is 126.8 pounds. Therapy: OT/ST: wheelchair cushioning; incontinence care; transfer mod assist brp mod assist; 14/50; BCAT. She will continue to be followed for her chronic illnesses including:  (HOCM) hypertrophic obstructive cardiomyopathy   Interstitial lung disease   Left hemiplegia  Past Medical History:  Diagnosis Date   Decreased sense of smell    and taste-negative CT scan-2011   Depression    Essential hypertension    GERD (gastroesophageal reflux disease)    Heart murmur    heard first time 4/12   History of mammogram    2010 and she does not want to repeat them anymore   HOCM (hypertrophic obstructive cardiomyopathy) (HCC)    a. Dx by echo 05/2014.   Hypothyroidism    IBS (irritable bowel syndrome)    Impaired fasting glucose    Low back pain    Lumbar radiculopathy    with negative MRI (1991)   Postmenopausal    with osteopenia, bisphosphonates 1/05-1/11, improved osteopenia 4/12-repeat planned late 2015   Shingles    2015   Thyroid nodule    Vitamin D deficiency    repleted on weekly vitamin D 51761 iu    Past Surgical History:  Procedure Laterality Date   ABDOMINAL HYSTERECTOMY     BREAST BIOPSY     x2   CATARACT  EXTRACTION     removal with IOL bilateral   GLAUCOMA SURGERY     laser   skin cancer removal     THYROIDECTOMY     subtotal    Social History   Socioeconomic History   Marital status: Married    Spouse name: Not on file   Number of children: Not on file   Years of education: Not on file   Highest education level: Not on file  Occupational History   Not on file  Tobacco Use   Smoking status: Never   Smokeless tobacco: Never  Vaping Use   Vaping status: Never Used  Substance and Sexual Activity   Alcohol use: No    Alcohol/week: 0.0 standard drinks of alcohol   Drug use: Never   Sexual activity: Not on file  Other Topics Concern   Not on file  Social History Narrative   Not on file   Social Drivers of Health   Financial Resource Strain: Not on file  Food Insecurity: No Food Insecurity (03/14/2023)   Hunger Vital Sign    Worried About Running Out of Food in the Last Year: Never true    Ran Out of Food in the Last Year: Never true  Transportation Needs: No Transportation Needs (03/14/2023)   PRAPARE - Transportation    Lack of Transportation (  Medical): No    Lack of Transportation (Non-Medical): No  Physical Activity: Not on file  Stress: Not on file  Social Connections: Not on file  Intimate Partner Violence: Not At Risk (03/14/2023)   Humiliation, Afraid, Rape, and Kick questionnaire    Fear of Current or Ex-Partner: No    Emotionally Abused: No    Physically Abused: No    Sexually Abused: No   Family History  Problem Relation Age of Onset   Breast cancer Mother    Hypertension Father    CVA Father    Heart failure Sister    CAD Sister    Kidney disease Sister    COPD Brother    Lung cancer Brother    CAD Brother    Diabetes Brother    Stroke Father    Heart attack Neg Hx       VITAL SIGNS BP 131/80   Pulse 77   Temp 97.9 F (36.6 C)   Resp 20   Ht 5\' 2"  (1.575 m)   Wt 126 lb 12.8 oz (57.5 kg)   SpO2 97%   BMI 23.19 kg/m   Outpatient  Encounter Medications as of 01/10/2024  Medication Sig   acetaminophen (TYLENOL) 325 MG tablet Take 650 mg by mouth every 6 (six) hours.   Cholecalciferol (VITAMIN D-3) 25 MCG (1000 UT) CAPS Take 1 capsule by mouth daily.   cyanocobalamin (VITAMIN B12) 1000 MCG tablet Take 2,000 mcg by mouth daily.   feeding supplement (ENSURE ENLIVE / ENSURE PLUS) LIQD Take 237 mLs by mouth 2 (two) times daily between meals.   levothyroxine (SYNTHROID) 50 MCG tablet Take 50 mcg by mouth daily before breakfast.   magnesium hydroxide (MILK OF MAGNESIA) 400 MG/5ML suspension Take 30 mLs by mouth daily as needed for mild constipation.   memantine (NAMENDA XR) 28 MG CP24 24 hr capsule Take 28 mg by mouth daily.   Menthol, Topical Analgesic, (BIOFREEZE) 4 % GEL Apply 1 application  topically 3 (three) times daily as needed.   omeprazole (PRILOSEC) 20 MG capsule Take 20 mg by mouth 2 (two) times daily.   oxymetazoline (AFRIN) 0.05 % nasal spray Place 1 spray into both nostrils daily as needed for congestion.   Phenylephrine HCl (PREPARATION H RE) Place 1 suppository rectally in the morning and at bedtime.   polyethylene glycol (MIRALAX / GLYCOLAX) 17 g packet Take 17 g by mouth daily.   sennosides-docusate sodium (SENOKOT-S) 8.6-50 MG tablet Take 1 tablet by mouth daily.   sertraline (ZOLOFT) 25 MG tablet Take 25 mg by mouth daily.   sodium chloride (OCEAN) 0.65 % SOLN nasal spray Place 1 spray into both nostrils 3 (three) times daily. And as needed   No facility-administered encounter medications on file as of 01/10/2024.     SIGNIFICANT DIAGNOSTIC EXAMS  PREVIOUS    03-14-23: ct of abdomen and pelvis:  1. No acute abnormality in the abdomen or pelvis. 2. Colonic diverticulosis without acute diverticulitis. Mildly distended rectum containing stool. 3. Diffuse peripheral reticulations and bilateral lower lobe traction bronchiectasis with scattered subsegmental mucous plugging, suggestive of chronic interstitial  lung disease. 4. Irregular left lower lobe lung nodule measures 6 mm. Non-contrast chest CT at 6-12 months is recommended. If the nodule is stable at time of repeat CT, then future CT at 18-24 months (from today's scan) is considered optional for low-risk patients, but is recommended for high-risk patients.  5. Aortic Atherosclerosis. Coronary artery calcifications.   LABS REVIEWED PREVIOUS  01-31-23: wbc 8.6; hgb 13.4; hct 40.9; mcv 92.7 plt 208; glucose 95; bun 20; creat 0.87; k+ 3.3; na++ 138; ca 8.7; gfr >60; protein 6.1; albumin 3.4 hgb A1c 5.6; tsh 2.505; vitamin D 37.78; vitamin B 12: 207 vitamin B 6: 4.4 03-14-23: wbc 16.7; hgb 9.5; hct 28.1; mcv 96.6 plt 214; glucose 154; bun 34; creat 1.36; k+ 5.7; na++ 138; ca 9.1 gfr 37; protein 6.0 albumin 3.4 03-14-23 (#2) wbc 16.1; hgb 8.5; hct 26.3; mcv 96.0 plt  205; glucose 140; bun 38; creat 1.35; k+ 4.9; na++ 138; ca 8.9; gfr 38; protein 5.8; albumin 3.2 FOB +; blood culture: no growth 03-15-23: wbc 10.9; hgb 12.2; hct 36.9; mcv 92.7 plt 149; glucose 101; bun 25; creat 1.03 ;k+ 3.9; na++ 139; ca 8.4 gfr 52 03-17-23: wbc 8.5; hgb 11.4; hct 34.5; mcv 93.5 plt 151; glucose 99; bun 18;creat 0.89; k+ 3.6; na++ 138; ca 8.2 gfr >60; mag 2.3  03-25-23: wbc 8.4; hgb 11.8; hct 37.1 mcv 95.9 plt 245  04-29-23: vitamin B 12: 491 05-13-23: urine culture: proteus mirabilis 06-06-23: wbc 9.9; hgb 13.8; hct 41.9; mcv 90.5 plt 206; glucose 97; bun 18; creat 1.08; k+ 3.4; na++ 136; ca 8.9 gfr 49; d-dimer 0.56  CRP 4.9 07-29-23: tsh 2.759; vitamin D 34.06 08-21-23: glucose 98; bun 21; creat 0.88; k+ 3.9; na++ 138; ca 9.0; gfr >60 10-18-23: glucose 107; bun 30; creat 1.04; k+ 4.3; na++ 141; ca 9.6; gfr 51   TODAY  12-30-23: glucose 141; bun 29; creat 0.90; k+ 5.2; na++ 139; ca 9.7; gfr >60   Review of Systems  Constitutional:  Negative for malaise/fatigue.  Respiratory:  Negative for cough and shortness of breath.   Cardiovascular:  Negative for chest pain,  palpitations and leg swelling.  Gastrointestinal:  Negative for abdominal pain, constipation and heartburn.  Musculoskeletal:  Negative for back pain, joint pain and myalgias.  Skin: Negative.   Neurological:  Negative for dizziness.  Psychiatric/Behavioral:  The patient is not nervous/anxious.    Physical Exam Constitutional:      General: She is not in acute distress.    Appearance: She is well-developed. She is not diaphoretic.  Neck:     Thyroid: No thyromegaly.  Cardiovascular:     Rate and Rhythm: Normal rate and regular rhythm.     Pulses: Normal pulses.     Heart sounds: Normal heart sounds.  Pulmonary:     Effort: Pulmonary effort is normal. No respiratory distress.     Breath sounds: Normal breath sounds.  Abdominal:     General: Bowel sounds are normal. There is no distension.     Palpations: Abdomen is soft.     Tenderness: There is no abdominal tenderness.  Musculoskeletal:     Cervical back: Neck supple.     Right lower leg: No edema.     Left lower leg: No edema.     Comments: Left hemiplegia     Lymphadenopathy:     Cervical: No cervical adenopathy.  Skin:    General: Skin is warm and dry.  Neurological:     Mental Status: She is alert. Mental status is at baseline.     Comments:  BCAT 12/50  Psychiatric:        Mood and Affect: Mood normal.      ASSESSMENT/ PLAN:  TODAY  (HOCM) hypertrophic obstructive cardiomyopathy  Interstitial lung disease Left hemiplegia  Will continue current medications Will continue current plan of care Will continue to monitor her  status.   Time spent with patient: 40 minutes: medications; dietary; plan of care.    Synthia Innocent NP [facility]  call [phone]

## 2024-01-29 ENCOUNTER — Non-Acute Institutional Stay (SKILLED_NURSING_FACILITY): Payer: Self-pay | Admitting: Adult Health

## 2024-01-29 ENCOUNTER — Encounter: Payer: Self-pay | Admitting: Adult Health

## 2024-01-29 DIAGNOSIS — R29818 Other symptoms and signs involving the nervous system: Secondary | ICD-10-CM

## 2024-01-29 DIAGNOSIS — F0150 Vascular dementia without behavioral disturbance: Secondary | ICD-10-CM

## 2024-01-29 DIAGNOSIS — G319 Degenerative disease of nervous system, unspecified: Secondary | ICD-10-CM

## 2024-01-29 DIAGNOSIS — J849 Interstitial pulmonary disease, unspecified: Secondary | ICD-10-CM | POA: Diagnosis not present

## 2024-01-29 DIAGNOSIS — R4189 Other symptoms and signs involving cognitive functions and awareness: Secondary | ICD-10-CM

## 2024-01-29 DIAGNOSIS — I421 Obstructive hypertrophic cardiomyopathy: Secondary | ICD-10-CM

## 2024-01-30 ENCOUNTER — Other Ambulatory Visit (HOSPITAL_COMMUNITY)
Admission: RE | Admit: 2024-01-30 | Discharge: 2024-01-30 | Disposition: A | Discharge status: Home / Self Care | Source: Skilled Nursing Facility | Attending: Adult Health | Admitting: Adult Health

## 2024-01-30 DIAGNOSIS — N1831 Chronic kidney disease, stage 3a: Secondary | ICD-10-CM | POA: Insufficient documentation

## 2024-01-30 LAB — COMPREHENSIVE METABOLIC PANEL WITH GFR
ALT: 10 U/L (ref 0–44)
AST: 15 U/L (ref 15–41)
Albumin: 3.4 g/dL — ABNORMAL LOW (ref 3.5–5.0)
Alkaline Phosphatase: 41 U/L (ref 38–126)
Anion gap: 7 (ref 5–15)
BUN: 24 mg/dL — ABNORMAL HIGH (ref 8–23)
CO2: 25 mmol/L (ref 22–32)
Calcium: 9.1 mg/dL (ref 8.9–10.3)
Chloride: 105 mmol/L (ref 98–111)
Creatinine, Ser: 0.86 mg/dL (ref 0.44–1.00)
GFR, Estimated: >60 mL/min (ref >60)
Glucose, Bld: 100 mg/dL — ABNORMAL HIGH (ref 70–99)
Potassium: 3.9 mmol/L (ref 3.5–5.1)
Sodium: 137 mmol/L (ref 135–145)
Total Bilirubin: 0.4 mg/dL (ref 0.0–1.2)
Total Protein: 6.5 g/dL (ref 6.5–8.1)

## 2024-01-30 LAB — CBC
HCT: 43.4 % (ref 36.0–46.0)
Hemoglobin: 14.2 g/dL (ref 12.0–15.0)
MCH: 30.1 pg (ref 26.0–34.0)
MCHC: 32.7 g/dL (ref 30.0–36.0)
MCV: 92.1 fL (ref 80.0–100.0)
Platelets: 221 10*3/uL (ref 150–400)
RBC: 4.71 MIL/uL (ref 3.87–5.11)
RDW: 13.4 % (ref 11.5–15.5)
WBC: 9.7 10*3/uL (ref 4.0–10.5)
nRBC: 0 % (ref 0.0–0.2)

## 2024-02-03 NOTE — Assessment & Plan Note (Addendum)
 neurocognitive deficits/cerebral atrophy:weight is 126 pounds; will continue  namenda xr 14 mg daily;  aricept had been stopped due to weight loss ; is slowly being weaned off namenda

## 2024-02-03 NOTE — Assessment & Plan Note (Signed)
 There are no reports of respiratory distress remains on room air

## 2024-02-03 NOTE — Assessment & Plan Note (Signed)
 Is currently without change in status; will monitor

## 2024-02-03 NOTE — Assessment & Plan Note (Signed)
 Weight is 126 pounds. Remains on namenda 14 mg daily; is off aricept

## 2024-02-03 NOTE — Progress Notes (Addendum)
 Location:  [facility] Room Number: [id number] Place of Service:  SNF (31)   CODE STATUS: dnr   Allergies  Allergen Reactions   Ace Inhibitors     Hyperkalemia    Hydrochlorothiazide     05/29/2021 K+ 2.6 Other reaction(s): Other (See Comments) 05/29/2021 K+ 2.6   Codeine Nausea Only    Chief Complaint  Patient presents with   Medical Management of Chronic Issues         Interstitial lung disease   HOCM (hypertrophic obstructive cardiomyopathy) Vascular dementia without behavioral disturbance  Neurocognitive deficits Cerebral atrophy      HPI:  She is a 89 y.o. long term resident of this facility being seen for the management of her chronic illnesses:Interstitial lung disease   HOCM (hypertrophic obstructive cardiomyopathy) Vascular dementia without behavioral disturbance  Neurocognitive deficits Cerebral atrophy.  There are no reports of uncontrolled pain. There have been no further reports of rectal bleeding present. She is tolerating the lower dose of namenda.    Past Medical History:  Diagnosis Date   Decreased sense of smell    and taste-negative CT scan-2011   Depression    Essential hypertension    GERD (gastroesophageal reflux disease)    Heart murmur    heard first time 4/12   History of mammogram    2010 and she does not want to repeat them anymore   HOCM (hypertrophic obstructive cardiomyopathy) (HCC)    a. Dx by echo 05/2014.   Hypothyroidism    IBS (irritable bowel syndrome)    Impaired fasting glucose    Low back pain    Lumbar radiculopathy    with negative MRI (1991)   Postmenopausal    with osteopenia, bisphosphonates 1/05-1/11, improved osteopenia 4/12-repeat planned late 2015   Shingles    2015   Thyroid nodule    Vitamin D deficiency    repleted on weekly vitamin D 98119 iu    Past Surgical History:  Procedure Laterality Date   ABDOMINAL HYSTERECTOMY     BREAST BIOPSY     x2   CATARACT EXTRACTION     removal with IOL  bilateral   GLAUCOMA SURGERY     laser   skin cancer removal     THYROIDECTOMY     subtotal    Social History   Socioeconomic History   Marital status: Married    Spouse name: Not on file   Number of children: Not on file   Years of education: Not on file   Highest education level: Not on file  Occupational History   Not on file  Tobacco Use   Smoking status: Never   Smokeless tobacco: Never  Vaping Use   Vaping status: Never Used  Substance and Sexual Activity   Alcohol use: No    Alcohol/week: 0.0 standard drinks of alcohol   Drug use: Never   Sexual activity: Not on file  Other Topics Concern   Not on file  Social History Narrative   Not on file   Social Drivers of Health   Financial Resource Strain: Not on file  Food Insecurity: No Food Insecurity (03/14/2023)   Hunger Vital Sign    Worried About Running Out of Food in the Last Year: Never true    Ran Out of Food in the Last Year: Never true  Transportation Needs: No Transportation Needs (03/14/2023)   PRAPARE - Administrator, Civil Service (Medical): No    Lack of Transportation (  Non-Medical): No  Physical Activity: Not on file  Stress: Not on file  Social Connections: Not on file  Intimate Partner Violence: Not At Risk (03/14/2023)   Humiliation, Afraid, Rape, and Kick questionnaire    Fear of Current or Ex-Partner: No    Emotionally Abused: No    Physically Abused: No    Sexually Abused: No   Family History  Problem Relation Age of Onset   Breast cancer Mother    Hypertension Father    CVA Father    Heart failure Sister    CAD Sister    Kidney disease Sister    COPD Brother    Lung cancer Brother    CAD Brother    Diabetes Brother    Stroke Father    Heart attack Neg Hx       VITAL SIGNS BP 131/75   Pulse 77   Temp (!) 97.2 F (36.2 C)   Resp 18   Ht 5\' 2"  (1.575 m)   Wt 126 lb 3.2 oz (57.2 kg)   SpO2 98%   BMI 23.08 kg/m   Outpatient Encounter Medications as of  01/29/2024  Medication Sig   acetaminophen (TYLENOL) 325 MG tablet Take 650 mg by mouth every 6 (six) hours.   Cholecalciferol (VITAMIN D-3) 25 MCG (1000 UT) CAPS Take 1 capsule by mouth daily.   cyanocobalamin (VITAMIN B12) 1000 MCG tablet Take 2,000 mcg by mouth daily.   feeding supplement (ENSURE ENLIVE / ENSURE PLUS) LIQD Take 237 mLs by mouth 2 (two) times daily between meals.   levothyroxine (SYNTHROID) 50 MCG tablet Take 50 mcg by mouth daily before breakfast.   magnesium hydroxide (MILK OF MAGNESIA) 400 MG/5ML suspension Take 30 mLs by mouth daily as needed for mild constipation.   memantine (NAMENDA XR) 14 MG CP24 24 hr capsule Take 14 mg by mouth.   Menthol, Topical Analgesic, (BIOFREEZE) 4 % GEL Apply 1 application  topically 3 (three) times daily as needed.   omeprazole (PRILOSEC) 20 MG capsule Take 20 mg by mouth 2 (two) times daily.   oxymetazoline (AFRIN) 0.05 % nasal spray Place 1 spray into both nostrils daily as needed for congestion.   Phenylephrine HCl (PREPARATION H RE) Place 1 suppository rectally in the morning and at bedtime.   polyethylene glycol (MIRALAX / GLYCOLAX) 17 g packet Take 17 g by mouth daily.   sennosides-docusate sodium (SENOKOT-S) 8.6-50 MG tablet Take 1 tablet by mouth daily.   sertraline (ZOLOFT) 25 MG tablet Take 25 mg by mouth daily.   sodium chloride (OCEAN) 0.65 % SOLN nasal spray Place 1 spray into both nostrils 3 (three) times daily. And as needed   No facility-administered encounter medications on file as of 01/29/2024.     SIGNIFICANT DIAGNOSTIC EXAMS  PREVIOUS    03-14-23: ct of abdomen and pelvis:  1. No acute abnormality in the abdomen or pelvis. 2. Colonic diverticulosis without acute diverticulitis. Mildly distended rectum containing stool. 3. Diffuse peripheral reticulations and bilateral lower lobe traction bronchiectasis with scattered subsegmental mucous plugging, suggestive of chronic interstitial lung disease. 4. Irregular left  lower lobe lung nodule measures 6 mm. Non-contrast chest CT at 6-12 months is recommended. If the nodule is stable at time of repeat CT, then future CT at 18-24 months (from today's scan) is considered optional for low-risk patients, but is recommended for high-risk patients.  5. Aortic Atherosclerosis. Coronary artery calcifications.   LABS REVIEWED PREVIOUS     01-31-23: wbc 8.6; hgb 13.4; hct  40.9; mcv 92.7 plt 208; glucose 95; bun 20; creat 0.87; k+ 3.3; na++ 138; ca 8.7; gfr >60; protein 6.1; albumin 3.4 hgb A1c 5.6; tsh 2.505; vitamin D 37.78; vitamin B 12: 207 vitamin B 6: 4.4 03-14-23: wbc 16.7; hgb 9.5; hct 28.1; mcv 96.6 plt 214; glucose 154; bun 34; creat 1.36; k+ 5.7; na++ 138; ca 9.1 gfr 37; protein 6.0 albumin 3.4 03-14-23 (#2) wbc 16.1; hgb 8.5; hct 26.3; mcv 96.0 plt  205; glucose 140; bun 38; creat 1.35; k+ 4.9; na++ 138; ca 8.9; gfr 38; protein 5.8; albumin 3.2 FOB +; blood culture: no growth 03-15-23: wbc 10.9; hgb 12.2; hct 36.9; mcv 92.7 plt 149; glucose 101; bun 25; creat 1.03 ;k+ 3.9; na++ 139; ca 8.4 gfr 52 03-17-23: wbc 8.5; hgb 11.4; hct 34.5; mcv 93.5 plt 151; glucose 99; bun 18;creat 0.89; k+ 3.6; na++ 138; ca 8.2 gfr >60; mag 2.3  03-25-23: wbc 8.4; hgb 11.8; hct 37.1 mcv 95.9 plt 245  04-29-23: vitamin B 12: 491 05-13-23: urine culture: proteus mirabilis 06-06-23: wbc 9.9; hgb 13.8; hct 41.9; mcv 90.5 plt 206; glucose 97; bun 18; creat 1.08; k+ 3.4; na++ 136; ca 8.9 gfr 49; d-dimer 0.56  CRP 4.9 07-29-23: tsh 2.759; vitamin D 34.06 08-21-23: glucose 98; bun 21; creat 0.88; k+ 3.9; na++ 138; ca 9.0; gfr >60 10-18-23: glucose 107; bun 30; creat 1.04; k+ 4.3; na++ 141; ca 9.6; gfr 51   TODAY  12-30-23: glucose 141; bun 29; creat 0.90; k+ 5.2; na++ 139; ca 9.7; gfr >60  01-06-24: glucose 100; bun 25; creat 0.96; k+ 3.8; na++ 139; ca 9.0; gfr 56   Review of Systems  Constitutional:  Negative for malaise/fatigue.  Respiratory:  Negative for cough and shortness of breath.    Cardiovascular:  Negative for chest pain, palpitations and leg swelling.  Gastrointestinal:  Negative for abdominal pain, constipation and heartburn.  Musculoskeletal:  Negative for back pain, joint pain and myalgias.  Skin: Negative.   Neurological:  Negative for dizziness.  Psychiatric/Behavioral:  The patient is not nervous/anxious.     Physical Exam Constitutional:      General: She is not in acute distress.    Appearance: She is well-developed. She is not diaphoretic.  Neck:     Thyroid: No thyromegaly.  Cardiovascular:     Rate and Rhythm: Normal rate and regular rhythm.     Pulses: Normal pulses.     Heart sounds: Normal heart sounds.  Pulmonary:     Effort: Pulmonary effort is normal. No respiratory distress.     Breath sounds: Normal breath sounds.  Abdominal:     General: Bowel sounds are normal. There is no distension.     Palpations: Abdomen is soft.     Tenderness: There is no abdominal tenderness.  Musculoskeletal:     Cervical back: Neck supple.     Right lower leg: No edema.     Left lower leg: No edema.     Comments: Left hemiplegia    Lymphadenopathy:     Cervical: No cervical adenopathy.  Skin:    General: Skin is warm and dry.  Neurological:     Mental Status: She is alert. Mental status is at baseline.     Comments: BCAT 12/50  Psychiatric:        Mood and Affect: Mood normal.     ASSESSMENT/ PLAN:  TODAY  Interstitial lung disease (HCC) There are no reports of respiratory distress remains on room air   HOCM (  hypertrophic obstructive cardiomyopathy) Is currently without change in status; will monitor   Vascular dementia without behavioral disturbance (HCC) neurocognitive deficits/cerebral atrophy:weight is 126 pounds; will continue  namenda xr 14 mg daily;  aricept had been stopped due to weight loss ; is slowly being weaned off namenda   Neurocognitive deficits Weight is 126 pounds. Remains on namenda 14 mg daily; is off aricept    Cerebral atrophy (HCC) MRI 05-29-21:   IMPRESSION: 1. Acute right pontine infarct. Mild associated edema without mass effect. 2. Multiple small remote bilateral cerebellar lacunar infarcts. 3. Mild for age chronic microvascular ischemic disease and moderate atrophy.   PREVIOUS   6. Somnolence post stroke will continue to  monitor   7. Hypoalbuminemia due to protein calorie malnutrition: albumin 3.4 will continue supplements as directed  8. Vitamin B 12 deficiency: level 491 will continue 2000 mcg daily   9. Vitamin D deficiency: level 34.06; will continue 1000 units daily   10. Essential hypertension: b/p 131/75;  is off lisinopril due to hyperkalemia   12. Postoperative hypothyroidism: tsh 2.759 will continue synthroid 50 mcg daily   11. Mixed hyperlipidemia: is off statin due to advanced age.   12. Psychosis in the elderly without behavioral disturbance: is off seroquel   13. Depression recurrent: will continue zoloft 25 mg daily   14. Chronic constipation: will continue miralax daily   15. Thoracic aortic atherosclerosis (cxr 12-15-14) is not on statin due to advanced age.   16. History of CVA / left hemiplegia: is off asa due to GI bleed  17. Generalized chronic pain: will continue tylenol 650 mg every 6 hours   18.Dysphagia: no indications of aspiration present; will continue thin  liquids  19. Stage 3a chronic kidney disease: bun 24; creat 0.86; gfr >60  21. Gastrointestinal hemorrhage associated with diverticulosis(May 2024)/GERD without esophagitis: hgb stable; the bleeding felt to be related to diverticulosis. Will continue prilosec 20 mg twice daily    Britt Candle NP [facility]  call [phone]

## 2024-02-03 NOTE — Assessment & Plan Note (Signed)
 MRI 05-29-21:   IMPRESSION: 1. Acute right pontine infarct. Mild associated edema without mass effect. 2. Multiple small remote bilateral cerebellar lacunar infarcts. 3. Mild for age chronic microvascular ischemic disease and moderate atrophy.

## 2024-03-03 ENCOUNTER — Encounter: Payer: Self-pay | Admitting: Adult Health

## 2024-03-03 ENCOUNTER — Non-Acute Institutional Stay (SKILLED_NURSING_FACILITY): Admitting: Adult Health

## 2024-03-03 DIAGNOSIS — E46 Unspecified protein-calorie malnutrition: Secondary | ICD-10-CM | POA: Diagnosis not present

## 2024-03-03 DIAGNOSIS — R400 Somnolence: Secondary | ICD-10-CM

## 2024-03-03 DIAGNOSIS — E8809 Other disorders of plasma-protein metabolism, not elsewhere classified: Secondary | ICD-10-CM | POA: Diagnosis not present

## 2024-03-03 DIAGNOSIS — E538 Deficiency of other specified B group vitamins: Secondary | ICD-10-CM

## 2024-03-03 NOTE — Progress Notes (Signed)
 Location:  [facility] Room Number: 138/P Place of Service:  SNF (31)   CODE STATUS: DNR  Allergies  Allergen Reactions   Ace Inhibitors     Hyperkalemia    Hydrochlorothiazide     05/29/2021 K+ 2.6 Other reaction(s): Other (See Comments) 05/29/2021 K+ 2.6   Codeine Nausea Only    Chief Complaint  Patient presents with   Medical Management of Chronic Issues         Somnolence: Hypoalbuminemia due to protein calorie malnutrition: Vitamin B 12: deficiency     HPI:  She is a 89 y.o. long term resident of this facility being seen for the management of her chronic illnesses: Somnolence: Hypoalbuminemia due to protein calorie malnutrition: Vitamin B 12: deficiency. There are no reports of uncontrolled pain. Her weight remains without significant change. There are no reports of excessive sleeping.     Past Medical History:  Diagnosis Date   Decreased sense of smell    and taste-negative CT scan-2011   Depression    Essential hypertension    GERD (gastroesophageal reflux disease)    Heart murmur    heard first time 4/12   History of mammogram    2010 and she does not want to repeat them anymore   HOCM (hypertrophic obstructive cardiomyopathy) (HCC)    a. Dx by echo 05/2014.   Hypothyroidism    IBS (irritable bowel syndrome)    Impaired fasting glucose    Low back pain    Lumbar radiculopathy    with negative MRI (1991)   Postmenopausal    with osteopenia, bisphosphonates 1/05-1/11, improved osteopenia 4/12-repeat planned late 2015   Shingles    2015   Thyroid  nodule    Vitamin D  deficiency    repleted on weekly vitamin D  50000 iu    Past Surgical History:  Procedure Laterality Date   ABDOMINAL HYSTERECTOMY     BREAST BIOPSY     x2   CATARACT EXTRACTION     removal with IOL bilateral   GLAUCOMA SURGERY     laser   skin cancer removal     THYROIDECTOMY     subtotal    Social History   Socioeconomic History   Marital status: Married     Spouse name: Not on file   Number of children: Not on file   Years of education: Not on file   Highest education level: Not on file  Occupational History   Not on file  Tobacco Use   Smoking status: Never   Smokeless tobacco: Never  Vaping Use   Vaping status: Never Used  Substance and Sexual Activity   Alcohol use: No    Alcohol/week: 0.0 standard drinks of alcohol   Drug use: Never   Sexual activity: Not on file  Other Topics Concern   Not on file  Social History Narrative   Not on file   Social Drivers of Health   Financial Resource Strain: Not on file  Food Insecurity: No Food Insecurity (03/14/2023)   Hunger Vital Sign    Worried About Running Out of Food in the Last Year: Never true    Ran Out of Food in the Last Year: Never true  Transportation Needs: No Transportation Needs (03/14/2023)   PRAPARE - Administrator, Civil Service (Medical): No    Lack of Transportation (Non-Medical): No  Physical Activity: Not on file  Stress: Not on file  Social Connections: Not on file  Intimate Partner  Violence: Not At Risk (03/14/2023)   Humiliation, Afraid, Rape, and Kick questionnaire    Fear of Current or Ex-Partner: No    Emotionally Abused: No    Physically Abused: No    Sexually Abused: No   Family History  Problem Relation Age of Onset   Breast cancer Mother    Hypertension Father    CVA Father    Heart failure Sister    CAD Sister    Kidney disease Sister    COPD Brother    Lung cancer Brother    CAD Brother    Diabetes Brother    Stroke Father    Heart attack Neg Hx       VITAL SIGNS BP 136/78   Pulse 79   Temp 97.6 F (36.4 C)   Resp 20   Ht 5\' 2"  (1.575 m)   Wt 128 lb 12.8 oz (58.4 kg)   SpO2 98%   BMI 23.56 kg/m   Outpatient Encounter Medications as of 03/03/2024  Medication Sig   acetaminophen  (TYLENOL ) 325 MG tablet Take 650 mg by mouth every 6 (six) hours.   Balsam [place]-Castor Oil (VENELEX) OINT Apply topically.  topical,  Every Shift, Apply to sacrum, bilateral buttocks and bilateral ischial areas qshift for prevention.   Cholecalciferol (VITAMIN D -3) 25 MCG (1000 UT) CAPS Take 1 capsule by mouth daily.   cyanocobalamin  (VITAMIN B12) 1000 MCG tablet Take 2,000 mcg by mouth daily.   levothyroxine  (SYNTHROID ) 50 MCG tablet Take 50 mcg by mouth daily before breakfast.   memantine  (NAMENDA  XR) 14 MG CP24 24 hr capsule Take 14 mg by mouth.   Menthol, Topical Analgesic, (BIOFREEZE) 4 % GEL Apply 1 application  topically 3 (three) times daily as needed.   omeprazole (PRILOSEC) 20 MG capsule Take 20 mg by mouth 2 (two) times daily.   oxymetazoline (AFRIN) 0.05 % nasal spray Place 1 spray into both nostrils daily as needed for congestion.   Phenylephrine  HCl (PREPARATION H RE) Place 1 suppository rectally in the morning and at bedtime.   polyethylene glycol (MIRALAX  / GLYCOLAX ) 17 g packet Take 17 g by mouth daily.   sennosides-docusate sodium  (SENOKOT-S) 8.6-50 MG tablet Take 1 tablet by mouth daily.   sertraline  (ZOLOFT ) 25 MG tablet Take 25 mg by mouth daily.   sodium chloride  (OCEAN) 0.65 % SOLN nasal spray Place 1 spray into both nostrils 3 (three) times daily. And as needed   feeding supplement (ENSURE ENLIVE / ENSURE PLUS) LIQD Take 237 mLs by mouth 2 (two) times daily between meals. (Patient not taking: Reported on 03/03/2024)   magnesium hydroxide (MILK OF MAGNESIA) 400 MG/5ML suspension Take 30 mLs by mouth daily as needed for mild constipation. (Patient not taking: Reported on 03/03/2024)   No facility-administered encounter medications on file as of 03/03/2024.     SIGNIFICANT DIAGNOSTIC EXAMS  PREVIOUS    03-14-23: ct of abdomen and pelvis:  1. No acute abnormality in the abdomen or pelvis. 2. Colonic diverticulosis without acute diverticulitis. Mildly distended rectum containing stool. 3. Diffuse peripheral reticulations and bilateral lower lobe traction bronchiectasis with scattered subsegmental mucous  plugging, suggestive of chronic interstitial lung disease. 4. Irregular left lower lobe lung nodule measures 6 mm. Non-contrast chest CT at 6-12 months is recommended. If the nodule is stable at time of repeat CT, then future CT at 18-24 months (from today's scan) is considered optional for low-risk patients, but is recommended for high-risk patients.  5. Aortic Atherosclerosis. Coronary artery calcifications.  LABS REVIEWED PREVIOUS     03-14-23: wbc 16.7; hgb 9.5; hct 28.1; mcv 96.6 plt 214; glucose 154; bun 34; creat 1.36; k+ 5.7; na++ 138; ca 9.1 gfr 37; protein 6.0 albumin 3.4 03-14-23 (#2) wbc 16.1; hgb 8.5; hct 26.3; mcv 96.0 plt  205; glucose 140; bun 38; creat 1.35; k+ 4.9; na++ 138; ca 8.9; gfr 38; protein 5.8; albumin 3.2 FOB +; blood culture: no growth 03-15-23: wbc 10.9; hgb 12.2; hct 36.9; mcv 92.7 plt 149; glucose 101; bun 25; creat 1.03 ;k+ 3.9; na++ 139; ca 8.4 gfr 52 03-17-23: wbc 8.5; hgb 11.4; hct 34.5; mcv 93.5 plt 151; glucose 99; bun 18;creat 0.89; k+ 3.6; na++ 138; ca 8.2 gfr >60; mag 2.3  03-25-23: wbc 8.4; hgb 11.8; hct 37.1 mcv 95.9 plt 245  04-29-23: vitamin B 12: 491 05-13-23: urine culture: proteus mirabilis 06-06-23: wbc 9.9; hgb 13.8; hct 41.9; mcv 90.5 plt 206; glucose 97; bun 18; creat 1.08; k+ 3.4; na++ 136; ca 8.9 gfr 49; d-dimer 0.56  CRP 4.9 07-29-23: tsh 2.759; vitamin D  34.06 08-21-23: glucose 98; bun 21; creat 0.88; k+ 3.9; na++ 138; ca 9.0; gfr >60 10-18-23: glucose 107; bun 30; creat 1.04; k+ 4.3; na++ 141; ca 9.6; gfr 51   TODAY  12-30-23: glucose 141; bun 29; creat 0.90; k+ 5.2; na++ 139; ca 9.7; gfr >60  01-06-24: glucose 100; bun 25; creat 0.96; k+ 3.8; na++ 139; ca 9.0; gfr 56 01-30-24: wbc 9.7; hgb 14.2; hct 43.4; mcv 92.1 plt 221; glucose 100; bun 24; creat 0.86; k+ 3.9; na++ 137' ca 9.1; gfr >60; total protein 6.5; albumin 3.4    Review of Systems  Constitutional:  Negative for malaise/fatigue.  Respiratory:  Negative for cough and shortness of breath.    Cardiovascular:  Negative for chest pain, palpitations and leg swelling.  Gastrointestinal:  Negative for abdominal pain, constipation and heartburn.  Musculoskeletal:  Negative for back pain, joint pain and myalgias.  Skin: Negative.   Neurological:  Negative for dizziness.  Psychiatric/Behavioral:  The patient is not nervous/anxious.     Physical Exam Constitutional:      General: She is not in acute distress.    Appearance: She is well-developed. She is not diaphoretic.  Neck:     Thyroid : No thyromegaly.  Cardiovascular:     Rate and Rhythm: Normal rate and regular rhythm.     Pulses: Normal pulses.     Heart sounds: Normal heart sounds.  Pulmonary:     Effort: Pulmonary effort is normal. No respiratory distress.     Breath sounds: Normal breath sounds.  Abdominal:     General: Bowel sounds are normal. There is no distension.     Palpations: Abdomen is soft.     Tenderness: There is no abdominal tenderness.  Musculoskeletal:     Cervical back: Neck supple.     Right lower leg: No edema.     Left lower leg: No edema.     Comments:  Left hemiplegia  Lymphadenopathy:     Cervical: No cervical adenopathy.  Skin:    General: Skin is warm and dry.  Neurological:     Mental Status: She is alert. Mental status is at baseline.  Psychiatric:        Mood and Affect: Mood normal.    ASSESSMENT/ PLAN:  TODAY  Somnolence: post stroke: will continue to monitor  2. Hypoalbuminemia due to protein calorie malnutrition: albumin 3.4 will continue supplements as directed  3. Vitamin B 12: deficiency:  level 491 will continue 2,000 units daily  PREVIOUS   4. Somnolence post stroke will continue to  monitor   5. Hypoalbuminemia due to protein calorie malnutrition: albumin 3.4 will continue supplements as directed  6. Vitamin B 12 deficiency: level 491 will continue 2000 mcg daily   7. Vitamin D  deficiency: level 34.06; will continue 1000 units daily   8. Essential  hypertension: b/p 136/78;  is off lisinopril due to hyperkalemia   9. Postoperative hypothyroidism: tsh 2.759 will continue synthroid  50 mcg daily   10. Mixed hyperlipidemia: is off statin due to advanced age.   11. Psychosis in the elderly without behavioral disturbance: is off seroquel    12. Depression recurrent: will continue zoloft  25 mg daily   13. Chronic constipation: will continue miralax  daily   14. Thoracic aortic atherosclerosis (cxr 12-15-14) is not on statin due to advanced age.   15. History of CVA / left hemiplegia: is off asa due to GI bleed  16. Generalized chronic pain: will continue tylenol  650 mg every 6 hours   17.Dysphagia: no indications of aspiration present; will continue thin  liquids  18. Stage 3a chronic kidney disease: bun 24; creat 0.86; gfr >60  19. Gastrointestinal hemorrhage associated with diverticulosis(May 2024)/GERD without esophagitis: hgb stable; the bleeding felt to be related to diverticulosis. Will continue prilosec 20 mg twice daily   20. Interstitial lung disease (HCC) There are no reports of respiratory distress remains on room air   21. HOCM (hypertrophic obstructive cardiomyopathy) Is currently without change in status; will monitor   22. Vascular dementia without behavioral disturbance (HCC) neurocognitive deficits/cerebral atrophy:weight is 128 pounds; will continue  namenda  xr 14 mg daily;  aricept  had been stopped due to weight loss ; is slowly being weaned off namenda    23. Neurocognitive deficits Weight is 126 pounds. Remains on namenda  14 mg daily; is off aricept    24. Cerebral atrophy (HCC) MRI 05-29-21: IMPRESSION: 1. Acute right pontine infarct. Mild associated edema without mass effect. 2. Multiple small remote bilateral cerebellar lacunar infarcts. 3. Mild for age chronic microvascular ischemic disease and moderate atrophy.   Britt Candle NP [facility]  call [phone]

## 2024-04-01 ENCOUNTER — Encounter: Payer: Self-pay | Admitting: Adult Health

## 2024-04-01 ENCOUNTER — Non-Acute Institutional Stay (SKILLED_NURSING_FACILITY): Payer: Self-pay | Admitting: Adult Health

## 2024-04-01 DIAGNOSIS — I10 Essential (primary) hypertension: Secondary | ICD-10-CM | POA: Diagnosis not present

## 2024-04-01 DIAGNOSIS — E559 Vitamin D deficiency, unspecified: Secondary | ICD-10-CM

## 2024-04-01 DIAGNOSIS — E890 Postprocedural hypothyroidism: Secondary | ICD-10-CM

## 2024-04-01 NOTE — Progress Notes (Signed)
 Location:  [facility] Room Number: [id number] Place of Service:  SNF (31)   CODE STATUS: dnr   Allergies  Allergen Reactions   Ace Inhibitors     Hyperkalemia    Hydrochlorothiazide     05/29/2021 K+ 2.6 Other reaction(s): Other (See Comments) 05/29/2021 K+ 2.6   Codeine Nausea Only    Chief Complaint  Patient presents with   Medical Management of Chronic Issues        Vitamin D  deficiency: Essential hypertension: Postoperative hypothyroidism    HPI:  She is a 89 y.o. long term resident of this facility being seen for the management of her chronic illnesses:Vitamin D  deficiency: Essential hypertension: Postoperative hypothyroidism. Her blood pressure remains stable off lisinopril. There are no reports of uncontrolled pain. There are no reports of depressive thoughts.    Past Medical History:  Diagnosis Date   Decreased sense of smell    and taste-negative CT scan-2011   Depression    Essential hypertension    GERD (gastroesophageal reflux disease)    Heart murmur    heard first time 4/12   History of mammogram    2010 and she does not want to repeat them anymore   HOCM (hypertrophic obstructive cardiomyopathy) (HCC)    a. Dx by echo 05/2014.   Hypothyroidism    IBS (irritable bowel syndrome)    Impaired fasting glucose    Low back pain    Lumbar radiculopathy    with negative MRI (1991)   Postmenopausal    with osteopenia, bisphosphonates 1/05-1/11, improved osteopenia 4/12-repeat planned late 2015   Shingles    2015   Thyroid  nodule    Vitamin D  deficiency    repleted on weekly vitamin D  50000 iu    Past Surgical History:  Procedure Laterality Date   ABDOMINAL HYSTERECTOMY     BREAST BIOPSY     x2   CATARACT EXTRACTION     removal with IOL bilateral   GLAUCOMA SURGERY     laser   skin cancer removal     THYROIDECTOMY     subtotal    Social History   Socioeconomic History   Marital status: Married    Spouse name: Not on file    Number of children: Not on file   Years of education: Not on file   Highest education level: Not on file  Occupational History   Not on file  Tobacco Use   Smoking status: Never   Smokeless tobacco: Never  Vaping Use   Vaping status: Never Used  Substance and Sexual Activity   Alcohol use: No    Alcohol/week: 0.0 standard drinks of alcohol   Drug use: Never   Sexual activity: Not on file  Other Topics Concern   Not on file  Social History Narrative   Not on file   Social Drivers of Health   Financial Resource Strain: Not on file  Food Insecurity: No Food Insecurity (03/14/2023)   Hunger Vital Sign    Worried About Running Out of Food in the Last Year: Never true    Ran Out of Food in the Last Year: Never true  Transportation Needs: No Transportation Needs (03/14/2023)   PRAPARE - Administrator, Civil Service (Medical): No    Lack of Transportation (Non-Medical): No  Physical Activity: Not on file  Stress: Not on file  Social Connections: Not on file  Intimate Partner Violence: Not At Risk (03/14/2023)   Humiliation, Afraid, Rape,  and Kick questionnaire    Fear of Current or Ex-Partner: No    Emotionally Abused: No    Physically Abused: No    Sexually Abused: No   Family History  Problem Relation Age of Onset   Breast cancer Mother    Hypertension Father    CVA Father    Heart failure Sister    CAD Sister    Kidney disease Sister    COPD Brother    Lung cancer Brother    CAD Brother    Diabetes Brother    Stroke Father    Heart attack Neg Hx       VITAL SIGNS BP 115/68   Pulse 68   Temp 97.6 F (36.4 C)   Resp 20   Ht 5' 2 (1.575 m)   Wt 129 lb 9.6 oz (58.8 kg)   SpO2 96%   BMI 23.70 kg/m   Outpatient Encounter Medications as of 04/01/2024  Medication Sig   acetaminophen  (TYLENOL ) 325 MG tablet Take 650 mg by mouth every 6 (six) hours.   Balsam [place]-Castor Oil (VENELEX) OINT Apply topically.  topical, Every Shift, Apply to sacrum,  bilateral buttocks and bilateral ischial areas qshift for prevention.   Cholecalciferol (VITAMIN D -3) 25 MCG (1000 UT) CAPS Take 1 capsule by mouth daily.   cyanocobalamin  (VITAMIN B12) 1000 MCG tablet Take 2,000 mcg by mouth daily.   feeding supplement (ENSURE ENLIVE / ENSURE PLUS) LIQD Take 237 mLs by mouth 2 (two) times daily between meals. (Patient not taking: Reported on 03/03/2024)   levothyroxine  (SYNTHROID ) 50 MCG tablet Take 50 mcg by mouth daily before breakfast.   magnesium hydroxide (MILK OF MAGNESIA) 400 MG/5ML suspension Take 30 mLs by mouth daily as needed for mild constipation. (Patient not taking: Reported on 03/03/2024)   memantine  (NAMENDA  XR) 14 MG CP24 24 hr capsule Take 14 mg by mouth.   Menthol, Topical Analgesic, (BIOFREEZE) 4 % GEL Apply 1 application  topically 3 (three) times daily as needed.   omeprazole (PRILOSEC) 20 MG capsule Take 20 mg by mouth 2 (two) times daily.   oxymetazoline (AFRIN) 0.05 % nasal spray Place 1 spray into both nostrils daily as needed for congestion.   Phenylephrine  HCl (PREPARATION H RE) Place 1 suppository rectally in the morning and at bedtime.   polyethylene glycol (MIRALAX  / GLYCOLAX ) 17 g packet Take 17 g by mouth daily.   sennosides-docusate sodium  (SENOKOT-S) 8.6-50 MG tablet Take 1 tablet by mouth daily.   sertraline  (ZOLOFT ) 25 MG tablet Take 25 mg by mouth daily.   sodium chloride  (OCEAN) 0.65 % SOLN nasal spray Place 1 spray into both nostrils 3 (three) times daily. And as needed   No facility-administered encounter medications on file as of 04/01/2024.     SIGNIFICANT DIAGNOSTIC EXAMS  LABS REVIEWED PREVIOUS      03-25-23: wbc 8.4; hgb 11.8; hct 37.1 mcv 95.9 plt 245  04-29-23: vitamin B 12: 491 05-13-23: urine culture: proteus mirabilis 06-06-23: wbc 9.9; hgb 13.8; hct 41.9; mcv 90.5 plt 206; glucose 97; bun 18; creat 1.08; k+ 3.4; na++ 136; ca 8.9 gfr 49; d-dimer 0.56  CRP 4.9 07-29-23: tsh 2.759; vitamin D  34.06 08-21-23: glucose  98; bun 21; creat 0.88; k+ 3.9; na++ 138; ca 9.0; gfr >60 10-18-23: glucose 107; bun 30; creat 1.04; k+ 4.3; na++ 141; ca 9.6; gfr 51  12-30-23: glucose 141; bun 29; creat 0.90; k+ 5.2; na++ 139; ca 9.7; gfr >60  01-06-24: glucose 100; bun 25; creat  0.96; k+ 3.8; na++ 139; ca 9.0; gfr 56 01-30-24: wbc 9.7; hgb 14.2; hct 43.4; mcv 92.1 plt 221; glucose 100; bun 24; creat 0.86; k+ 3.9; na++ 137' ca 9.1; gfr >60; total protein 6.5; albumin 3.4   NO NEW LABS.    Review of Systems  Constitutional:  Negative for malaise/fatigue.  Respiratory:  Negative for cough and shortness of breath.   Cardiovascular:  Negative for chest pain, palpitations and leg swelling.  Gastrointestinal:  Negative for abdominal pain, constipation and heartburn.  Musculoskeletal:  Negative for back pain, joint pain and myalgias.  Skin: Negative.   Neurological:  Negative for dizziness.  Psychiatric/Behavioral:  The patient is not nervous/anxious.    Physical Exam Constitutional:      General: She is not in acute distress.    Appearance: She is well-developed. She is not diaphoretic.  Neck:     Thyroid : No thyromegaly.   Cardiovascular:     Rate and Rhythm: Normal rate and regular rhythm.     Pulses: Normal pulses.     Heart sounds: Normal heart sounds.  Pulmonary:     Effort: Pulmonary effort is normal. No respiratory distress.     Breath sounds: Normal breath sounds.  Abdominal:     General: Bowel sounds are normal. There is no distension.     Palpations: Abdomen is soft.     Tenderness: There is no abdominal tenderness.   Musculoskeletal:     Cervical back: Neck supple.     Right lower leg: No edema.     Left lower leg: No edema.     Comments:  Left hemiplegia   Lymphadenopathy:     Cervical: No cervical adenopathy.   Skin:    General: Skin is warm and dry.   Neurological:     Mental Status: She is alert. Mental status is at baseline.   Psychiatric:        Mood and Affect: Mood normal.      ASSESSMENT/ PLAN:  TODAY  Vitamin D  deficiency: level 34.06; will continue 1,000 units daily   2. Essential hypertension: b/p 115/68: is off lisinopril due to hyperkalemia  3. Postoperative hypothyroidism: tsh 2.759 will continue synthroid  50 mcg daily   PREVIOUS   4. Mixed hyperlipidemia: is off statin due to advanced age.   5. Psychosis in the elderly without behavioral disturbance: is off seroquel    6. Depression recurrent: will continue zoloft  25 mg daily   7. Chronic constipation: will continue miralax  daily   8. Thoracic aortic atherosclerosis (cxr 12-15-14) is not on statin due to advanced age.   9. History of CVA / left hemiplegia: is off asa due to GI bleed  10. Generalized chronic pain: will continue tylenol  650 mg every 6 hours   11.Dysphagia: no indications of aspiration present; will continue thin  liquids  12. Stage 3a chronic kidney disease: bun 24; creat 0.86; gfr >60  13. Gastrointestinal hemorrhage associated with diverticulosis(May 2024)/GERD without esophagitis: hgb stable; the bleeding felt to be related to diverticulosis. Will continue prilosec 20 mg twice daily   14. Interstitial lung disease (HCC) There are no reports of respiratory distress remains on room air   15. HOCM (hypertrophic obstructive cardiomyopathy) Is currently without change in status; will monitor   16. Vascular dementia without behavioral disturbance (HCC) neurocognitive deficits/cerebral atrophy:weight is 129 pounds; will continue  namenda  xr 14 mg daily;  aricept  had been stopped due to weight loss ; is slowly being weaned off namenda    17.  Neurocognitive deficits Weight is 126 pounds. Remains on namenda  14 mg daily; is off aricept    18. Cerebral atrophy (HCC) MRI 05-29-21: IMPRESSION: 1. Acute right pontine infarct. Mild associated edema without mass effect. 2. Multiple small remote bilateral cerebellar lacunar infarcts. 3. Mild for age chronic microvascular ischemic disease  and moderate atrophy.  19. Somnolence: post stroke: will continue to monitor  20. Hypoalbuminemia due to protein calorie malnutrition: albumin 3.4 will continue supplements as directed  21. Vitamin B 12: deficiency: level 491 will continue 2,000 units daily   Britt Candle NP [facility]   call [phone]

## 2024-04-10 ENCOUNTER — Non-Acute Institutional Stay (SKILLED_NURSING_FACILITY): Payer: Self-pay | Admitting: Adult Health

## 2024-04-10 ENCOUNTER — Encounter: Payer: Self-pay | Admitting: Adult Health

## 2024-04-10 DIAGNOSIS — J849 Interstitial pulmonary disease, unspecified: Secondary | ICD-10-CM

## 2024-04-10 DIAGNOSIS — I700 Atherosclerosis of aorta: Secondary | ICD-10-CM

## 2024-04-10 DIAGNOSIS — I421 Obstructive hypertrophic cardiomyopathy: Secondary | ICD-10-CM

## 2024-04-10 NOTE — Progress Notes (Signed)
 Location:  [facility] Room Number: [id number] Place of Service:  SNF (31)   CODE STATUS: dnr   Allergies  Allergen Reactions   Ace Inhibitors     Hyperkalemia    Hydrochlorothiazide     05/29/2021 K+ 2.6 Other reaction(s): Other (See Comments) 05/29/2021 K+ 2.6   Codeine Nausea Only    Chief Complaint  Patient presents with   Acute Visit    Care plan meeting.     HPI:  We have come together for her care plan meeting. BIMS 6/15 mood 0/30. She is out of bed to wheelchair without falls. She requires moderate to dependent assist with her adl care. She is frequently incontinent of bladder and bowel. Dietary: regular diet setup for meals; appetite 51-75%; weight is 129.6 pounds. Therapy: none at this time. Activities: does participate; she does get emotional at times. She will continue to be followed for her chronic illnesses including:   Thoracic aortic atherosclerosis  HOCM (hypertrophic obstructive cardiomyopathy)  Interstitial lung disease  Past Medical History:  Diagnosis Date   Decreased sense of smell    and taste-negative CT scan-2011   Depression    Essential hypertension    GERD (gastroesophageal reflux disease)    Heart murmur    heard first time 4/12   History of mammogram    2010 and she does not want to repeat them anymore   HOCM (hypertrophic obstructive cardiomyopathy) (HCC)    a. Dx by echo 05/2014.   Hypothyroidism    IBS (irritable bowel syndrome)    Impaired fasting glucose    Low back pain    Lumbar radiculopathy    with negative MRI (1991)   Postmenopausal    with osteopenia, bisphosphonates 1/05-1/11, improved osteopenia 4/12-repeat planned late 2015   Shingles    2015   Thyroid  nodule    Vitamin D  deficiency    repleted on weekly vitamin D  50000 iu    Past Surgical History:  Procedure Laterality Date   ABDOMINAL HYSTERECTOMY     BREAST BIOPSY     x2   CATARACT EXTRACTION     removal with IOL bilateral   GLAUCOMA SURGERY      laser   skin cancer removal     THYROIDECTOMY     subtotal    Social History   Socioeconomic History   Marital status: Married    Spouse name: Not on file   Number of children: Not on file   Years of education: Not on file   Highest education level: Not on file  Occupational History   Not on file  Tobacco Use   Smoking status: Never   Smokeless tobacco: Never  Vaping Use   Vaping status: Never Used  Substance and Sexual Activity   Alcohol use: No    Alcohol/week: 0.0 standard drinks of alcohol   Drug use: Never   Sexual activity: Not on file  Other Topics Concern   Not on file  Social History Narrative   Not on file   Social Drivers of Health   Financial Resource Strain: Not on file  Food Insecurity: No Food Insecurity (03/14/2023)   Hunger Vital Sign    Worried About Running Out of Food in the Last Year: Never true    Ran Out of Food in the Last Year: Never true  Transportation Needs: No Transportation Needs (03/14/2023)   PRAPARE - Administrator, Civil Service (Medical): No    Lack of Transportation (  Non-Medical): No  Physical Activity: Not on file  Stress: Not on file  Social Connections: Not on file  Intimate Partner Violence: Not At Risk (03/14/2023)   Humiliation, Afraid, Rape, and Kick questionnaire    Fear of Current or Ex-Partner: No    Emotionally Abused: No    Physically Abused: No    Sexually Abused: No   Family History  Problem Relation Age of Onset   Breast cancer Mother    Hypertension Father    CVA Father    Heart failure Sister    CAD Sister    Kidney disease Sister    COPD Brother    Lung cancer Brother    CAD Brother    Diabetes Brother    Stroke Father    Heart attack Neg Hx       VITAL SIGNS BP 133/71   Pulse 75   Temp 98 F (36.7 C)   Resp 20   Ht 5' 2 (1.575 m)   Wt 129 lb 9.6 oz (58.8 kg)   SpO2 96%   BMI 23.70 kg/m   Outpatient Encounter Medications as of 04/10/2024  Medication Sig   acetaminophen   (TYLENOL ) 325 MG tablet Take 650 mg by mouth every 6 (six) hours.   Balsam [place]-Castor Oil (VENELEX) OINT Apply topically.  topical, Every Shift, Apply to sacrum, bilateral buttocks and bilateral ischial areas qshift for prevention.   Cholecalciferol (VITAMIN D -3) 25 MCG (1000 UT) CAPS Take 1 capsule by mouth daily.   cyanocobalamin  (VITAMIN B12) 1000 MCG tablet Take 2,000 mcg by mouth daily.   levothyroxine  (SYNTHROID ) 50 MCG tablet Take 50 mcg by mouth daily before breakfast.   memantine  (NAMENDA  XR) 7 MG CP24 24 hr capsule Take 7 mg by mouth daily.   Menthol, Topical Analgesic, (BIOFREEZE) 4 % GEL Apply 1 application  topically 3 (three) times daily as needed.   omeprazole (PRILOSEC) 20 MG capsule Take 20 mg by mouth 2 (two) times daily.   oxymetazoline (AFRIN) 0.05 % nasal spray Place 1 spray into both nostrils daily as needed for congestion.   Phenylephrine  HCl (PREPARATION H RE) Place 1 suppository rectally in the morning and at bedtime.   polyethylene glycol (MIRALAX  / GLYCOLAX ) 17 g packet Take 17 g by mouth daily.   sennosides-docusate sodium  (SENOKOT-S) 8.6-50 MG tablet Take 1 tablet by mouth daily.   sertraline  (ZOLOFT ) 25 MG tablet Take 25 mg by mouth daily.   sodium chloride  (OCEAN) 0.65 % SOLN nasal spray Place 1 spray into both nostrils 3 (three) times daily. And as needed   No facility-administered encounter medications on file as of 04/10/2024.     SIGNIFICANT DIAGNOSTIC EXAMS  LABS REVIEWED PREVIOUS      03-25-23: wbc 8.4; hgb 11.8; hct 37.1 mcv 95.9 plt 245  04-29-23: vitamin B 12: 491 05-13-23: urine culture: proteus mirabilis 06-06-23: wbc 9.9; hgb 13.8; hct 41.9; mcv 90.5 plt 206; glucose 97; bun 18; creat 1.08; k+ 3.4; na++ 136; ca 8.9 gfr 49; d-dimer 0.56  CRP 4.9 07-29-23: tsh 2.759; vitamin D  34.06 08-21-23: glucose 98; bun 21; creat 0.88; k+ 3.9; na++ 138; ca 9.0; gfr >60 10-18-23: glucose 107; bun 30; creat 1.04; k+ 4.3; na++ 141; ca 9.6; gfr 51  12-30-23: glucose  141; bun 29; creat 0.90; k+ 5.2; na++ 139; ca 9.7; gfr >60  01-06-24: glucose 100; bun 25; creat 0.96; k+ 3.8; na++ 139; ca 9.0; gfr 56 01-30-24: wbc 9.7; hgb 14.2; hct 43.4; mcv 92.1 plt 221; glucose  100; bun 24; creat 0.86; k+ 3.9; na++ 137' ca 9.1; gfr >60; total protein 6.5; albumin 3.4   NO NEW LABS.   Review of Systems  Constitutional:  Negative for malaise/fatigue.  Respiratory:  Negative for cough and shortness of breath.   Cardiovascular:  Negative for chest pain, palpitations and leg swelling.  Gastrointestinal:  Negative for abdominal pain, constipation and heartburn.  Musculoskeletal:  Negative for back pain, joint pain and myalgias.  Skin: Negative.   Neurological:  Negative for dizziness.  Psychiatric/Behavioral:  The patient is not nervous/anxious.     Physical Exam Constitutional:      General: She is not in acute distress.    Appearance: She is well-developed. She is not diaphoretic.  Neck:     Thyroid : No thyromegaly.   Cardiovascular:     Rate and Rhythm: Normal rate and regular rhythm.     Pulses: Normal pulses.     Heart sounds: Normal heart sounds.  Pulmonary:     Effort: Pulmonary effort is normal. No respiratory distress.     Breath sounds: Normal breath sounds.  Abdominal:     General: Bowel sounds are normal. There is no distension.     Palpations: Abdomen is soft.     Tenderness: There is no abdominal tenderness.   Musculoskeletal:     Cervical back: Neck supple.     Right lower leg: No edema.     Left lower leg: No edema.     Comments:  Left hemiplegia    Lymphadenopathy:     Cervical: No cervical adenopathy.   Skin:    General: Skin is warm and dry.   Neurological:     Mental Status: She is alert. Mental status is at baseline.   Psychiatric:        Mood and Affect: Mood normal.      ASSESSMENT/ PLAN:  TODAY  Thoracic aortic atherosclerosis HOCM (hypertrophic obstructive cardiomyopathy) Interstitial lung disease  Will continue  current medications Will continue current plan of care Will continue to monitor her status.    Time spent with patient: 40 minutes: medications; dietary; plan of care.    Britt Candle NP [facility]   call [phone]

## 2024-05-06 ENCOUNTER — Non-Acute Institutional Stay (SKILLED_NURSING_FACILITY): Payer: Self-pay | Admitting: Adult Health

## 2024-05-06 ENCOUNTER — Encounter: Payer: Self-pay | Admitting: Adult Health

## 2024-05-06 DIAGNOSIS — F339 Major depressive disorder, recurrent, unspecified: Secondary | ICD-10-CM | POA: Diagnosis not present

## 2024-05-06 DIAGNOSIS — F0390 Unspecified dementia without behavioral disturbance: Secondary | ICD-10-CM | POA: Diagnosis not present

## 2024-05-06 DIAGNOSIS — E782 Mixed hyperlipidemia: Secondary | ICD-10-CM

## 2024-05-06 NOTE — Progress Notes (Signed)
 Location:  [facility] Room Number: [id number] Place of Service:  SNF (31)   CODE STATUS: dnr   Allergies  Allergen Reactions   Ace Inhibitors     Hyperkalemia    Hydrochlorothiazide     05/29/2021 K+ 2.6 Other reaction(s): Other (See Comments) 05/29/2021 K+ 2.6   Codeine Nausea Only    Chief Complaint  Patient presents with   Medical Management of Chronic Issues        Mixed hyperlipidemia: Psychosis in elderly without behavioral disturbance: Depression recurrent:    HPI:  She is a 89 y.o. long term resident of this facility being seen for the management of her chronic illnesses:Mixed hyperlipidemia: Psychosis in elderly without behavioral disturbance: Depression recurrent: there are no reports of uncontrolled pain. Her weight remains stable. No reports of delusions or hallucinations.    Past Medical History:  Diagnosis Date   Decreased sense of smell    and taste-negative CT scan-2011   Depression    Essential hypertension    GERD (gastroesophageal reflux disease)    Heart murmur    heard first time 4/12   History of mammogram    2010 and she does not want to repeat them anymore   HOCM (hypertrophic obstructive cardiomyopathy) (HCC)    a. Dx by echo 05/2014.   Hypothyroidism    IBS (irritable bowel syndrome)    Impaired fasting glucose    Low back pain    Lumbar radiculopathy    with negative MRI (1991)   Postmenopausal    with osteopenia, bisphosphonates 1/05-1/11, improved osteopenia 4/12-repeat planned late 2015   Shingles    2015   Thyroid  nodule    Vitamin D  deficiency    repleted on weekly vitamin D  50000 iu    Past Surgical History:  Procedure Laterality Date   ABDOMINAL HYSTERECTOMY     BREAST BIOPSY     x2   CATARACT EXTRACTION     removal with IOL bilateral   GLAUCOMA SURGERY     laser   skin cancer removal     THYROIDECTOMY     subtotal    Social History   Socioeconomic History   Marital status: Married    Spouse  name: Not on file   Number of children: Not on file   Years of education: Not on file   Highest education level: Not on file  Occupational History   Not on file  Tobacco Use   Smoking status: Never   Smokeless tobacco: Never  Vaping Use   Vaping status: Never Used  Substance and Sexual Activity   Alcohol use: No    Alcohol/week: 0.0 standard drinks of alcohol   Drug use: Never   Sexual activity: Not on file  Other Topics Concern   Not on file  Social History Narrative   Not on file   Social Drivers of Health   Financial Resource Strain: Not on file  Food Insecurity: No Food Insecurity (03/14/2023)   Hunger Vital Sign    Worried About Running Out of Food in the Last Year: Never true    Ran Out of Food in the Last Year: Never true  Transportation Needs: No Transportation Needs (03/14/2023)   PRAPARE - Administrator, Civil Service (Medical): No    Lack of Transportation (Non-Medical): No  Physical Activity: Not on file  Stress: Not on file  Social Connections: Not on file  Intimate Partner Violence: Not At Risk (03/14/2023)   Humiliation,  Afraid, Rape, and Kick questionnaire    Fear of Current or Ex-Partner: No    Emotionally Abused: No    Physically Abused: No    Sexually Abused: No   Family History  Problem Relation Age of Onset   Breast cancer Mother    Hypertension Father    CVA Father    Heart failure Sister    CAD Sister    Kidney disease Sister    COPD Brother    Lung cancer Brother    CAD Brother    Diabetes Brother    Stroke Father    Heart attack Neg Hx       VITAL SIGNS BP 134/76   Pulse 74   Temp 97.8 F (36.6 C)   Resp 20   Ht 5' 2 (1.575 m)   Wt 132 lb 3.2 oz (60 kg)   SpO2 96%   BMI 24.18 kg/m   Outpatient Encounter Medications as of 05/06/2024  Medication Sig   acetaminophen  (TYLENOL ) 325 MG tablet Take 650 mg by mouth every 6 (six) hours.   Balsam [place]-Castor Oil (VENELEX) OINT Apply topically.  topical, Every Shift,  Apply to sacrum, bilateral buttocks and bilateral ischial areas qshift for prevention.   Cholecalciferol (VITAMIN D -3) 25 MCG (1000 UT) CAPS Take 1 capsule by mouth daily.   cyanocobalamin  (VITAMIN B12) 1000 MCG tablet Take 2,000 mcg by mouth daily.   levothyroxine  (SYNTHROID ) 50 MCG tablet Take 50 mcg by mouth daily before breakfast.   memantine  (NAMENDA  XR) 7 MG CP24 24 hr capsule Take 7 mg by mouth daily.   Menthol, Topical Analgesic, (BIOFREEZE) 4 % GEL Apply 1 application  topically 3 (three) times daily as needed.   omeprazole (PRILOSEC) 20 MG capsule Take 20 mg by mouth 2 (two) times daily.   oxymetazoline (AFRIN) 0.05 % nasal spray Place 1 spray into both nostrils daily as needed for congestion.   Phenylephrine  HCl (PREPARATION H RE) Place 1 suppository rectally in the morning and at bedtime.   polyethylene glycol (MIRALAX  / GLYCOLAX ) 17 g packet Take 17 g by mouth daily.   sennosides-docusate sodium  (SENOKOT-S) 8.6-50 MG tablet Take 1 tablet by mouth daily.   sertraline  (ZOLOFT ) 25 MG tablet Take 25 mg by mouth daily.   sodium chloride  (OCEAN) 0.65 % SOLN nasal spray Place 1 spray into both nostrils 3 (three) times daily. And as needed   No facility-administered encounter medications on file as of 05/06/2024.     SIGNIFICANT DIAGNOSTIC EXAMS  LABS REVIEWED PREVIOUS      04-29-23: vitamin B 12: 491 05-13-23: urine culture: proteus mirabilis 06-06-23: wbc 9.9; hgb 13.8; hct 41.9; mcv 90.5 plt 206; glucose 97; bun 18; creat 1.08; k+ 3.4; na++ 136; ca 8.9 gfr 49; d-dimer 0.56  CRP 4.9 07-29-23: tsh 2.759; vitamin D  34.06 08-21-23: glucose 98; bun 21; creat 0.88; k+ 3.9; na++ 138; ca 9.0; gfr >60 10-18-23: glucose 107; bun 30; creat 1.04; k+ 4.3; na++ 141; ca 9.6; gfr 51  12-30-23: glucose 141; bun 29; creat 0.90; k+ 5.2; na++ 139; ca 9.7; gfr >60  01-06-24: glucose 100; bun 25; creat 0.96; k+ 3.8; na++ 139; ca 9.0; gfr 56 01-30-24: wbc 9.7; hgb 14.2; hct 43.4; mcv 92.1 plt 221; glucose 100;  bun 24; creat 0.86; k+ 3.9; na++ 137' ca 9.1; gfr >60; total protein 6.5; albumin 3.4   NO NEW LABS.   Review of Systems  Constitutional:  Negative for malaise/fatigue.  Respiratory:  Negative for cough and shortness  of breath.   Cardiovascular:  Negative for chest pain, palpitations and leg swelling.  Gastrointestinal:  Negative for abdominal pain, constipation and heartburn.  Musculoskeletal:  Negative for back pain, joint pain and myalgias.  Skin: Negative.   Neurological:  Negative for dizziness.  Psychiatric/Behavioral:  The patient is not nervous/anxious.    Physical Exam Constitutional:      General: She is not in acute distress.    Appearance: She is well-developed. She is not diaphoretic.  Neck:     Thyroid : No thyromegaly.  Cardiovascular:     Rate and Rhythm: Normal rate and regular rhythm.     Pulses: Normal pulses.     Heart sounds: Normal heart sounds.  Pulmonary:     Effort: Pulmonary effort is normal. No respiratory distress.     Breath sounds: Normal breath sounds.  Abdominal:     General: Bowel sounds are normal. There is no distension.     Palpations: Abdomen is soft.     Tenderness: There is no abdominal tenderness.  Musculoskeletal:     Cervical back: Neck supple.     Right lower leg: No edema.     Left lower leg: No edema.     Comments: Left hemiplegia     Lymphadenopathy:     Cervical: No cervical adenopathy.  Skin:    General: Skin is warm and dry.  Neurological:     Mental Status: She is alert. Mental status is at baseline.  Psychiatric:        Mood and Affect: Mood normal.     ASSESSMENT/ PLAN:  TODAY  Mixed hyperlipidemia: is off statin due to advanced age  65. Psychosis in elderly without behavioral disturbance: is off seroquel   3. Depression recurrent: will continue zoloft  25 mg daily has failed one wean.   PREVIOUS   4. Chronic constipation: will continue miralax  daily   5. Thoracic aortic atherosclerosis (cxr 12-15-14) is not  on statin due to advanced age.   6. History of CVA / left hemiplegia: is off asa due to GI bleed  7. Generalized chronic pain: will continue tylenol  650 mg every 6 hours   8.Dysphagia: no indications of aspiration present; will continue thin  liquids  9. Stage 3a chronic kidney disease: bun 24; creat 0.86; gfr >60  10. Gastrointestinal hemorrhage associated with diverticulosis(May 2024)/GERD without esophagitis: hgb stable; the bleeding felt to be related to diverticulosis. Will continue prilosec 20 mg twice daily   11. Interstitial lung disease (HCC) There are no reports of respiratory distress remains on room air   12. HOCM (hypertrophic obstructive cardiomyopathy) Is currently without change in status; will monitor   13. Vascular dementia without behavioral disturbance (HCC) neurocognitive deficits/cerebral atrophy:weight is 132 pounds; will continue  namenda  xr 14 mg daily;  aricept  had been stopped due to weight loss ; is slowly being weaned off namenda    14. Neurocognitive deficits Weight is 126 pounds. Remains on namenda  14 mg daily; is off aricept    15. Cerebral atrophy (HCC) MRI 05-29-21: IMPRESSION: 1. Acute right pontine infarct. Mild associated edema without mass effect. 2. Multiple small remote bilateral cerebellar lacunar infarcts. 3. Mild for age chronic microvascular ischemic disease and moderate atrophy.  16. Somnolence: post stroke: will continue to monitor  17. Hypoalbuminemia due to protein calorie malnutrition: albumin 3.4 will continue supplements as directed  18. Vitamin B 12: deficiency: level 491 will continue 2,000 units daily  19. Vitamin D  deficiency: level 34.06; will continue 1,000 units daily  20. Essential hypertension: b/p 134/76: is off lisinopril due to hyperkalemia  21. Postoperative hypothyroidism: tsh 2.759 will continue synthroid  50 mcg daily     Barnie Seip NP [facility]   call [phone]

## 2024-06-01 ENCOUNTER — Non-Acute Institutional Stay (SKILLED_NURSING_FACILITY): Admitting: Adult Health

## 2024-06-01 ENCOUNTER — Encounter: Payer: Self-pay | Admitting: Adult Health

## 2024-06-01 DIAGNOSIS — K5909 Other constipation: Secondary | ICD-10-CM

## 2024-06-01 DIAGNOSIS — G8194 Hemiplegia, unspecified affecting left nondominant side: Secondary | ICD-10-CM | POA: Diagnosis not present

## 2024-06-01 DIAGNOSIS — I700 Atherosclerosis of aorta: Secondary | ICD-10-CM | POA: Diagnosis not present

## 2024-06-01 DIAGNOSIS — Z8673 Personal history of transient ischemic attack (TIA), and cerebral infarction without residual deficits: Secondary | ICD-10-CM

## 2024-06-01 NOTE — Progress Notes (Signed)
 Location:  [facility] Room Number: [id number] Place of Service:  SNF (31)   CODE STATUS: dnr   Allergies  Allergen Reactions   Ace Inhibitors     Hyperkalemia    Hydrochlorothiazide     05/29/2021 K+ 2.6 Other reaction(s): Other (See Comments) 05/29/2021 K+ 2.6   Codeine Nausea Only    Chief Complaint  Patient presents with   Medical Management of Chronic Issues        Chronic constipation:   Thoracic aortic atherosclerosis   History of CVA/left hemiplegia    HPI:  She is a 89 y.o. long term resident of this facility being seen for the management of her chronic illnesses:Chronic constipation:   Thoracic aortic atherosclerosis   History of CVA/left hemiplegia. There are no reports of further GI bleed. No reports of uncontrolled pain. Her weight remains stable.    Past Medical History:  Diagnosis Date   Decreased sense of smell    and taste-negative CT scan-2011   Depression    Essential hypertension    GERD (gastroesophageal reflux disease)    Heart murmur    heard first time 4/12   History of mammogram    2010 and she does not want to repeat them anymore   HOCM (hypertrophic obstructive cardiomyopathy) (HCC)    a. Dx by echo 05/2014.   Hypothyroidism    IBS (irritable bowel syndrome)    Impaired fasting glucose    Low back pain    Lumbar radiculopathy    with negative MRI (1991)   Postmenopausal    with osteopenia, bisphosphonates 1/05-1/11, improved osteopenia 4/12-repeat planned late 2015   Shingles    2015   Thyroid  nodule    Vitamin D  deficiency    repleted on weekly vitamin D  50000 iu    Past Surgical History:  Procedure Laterality Date   ABDOMINAL HYSTERECTOMY     BREAST BIOPSY     x2   CATARACT EXTRACTION     removal with IOL bilateral   GLAUCOMA SURGERY     laser   skin cancer removal     THYROIDECTOMY     subtotal    Social History   Socioeconomic History   Marital status: Married    Spouse name: Not on file   Number  of children: Not on file   Years of education: Not on file   Highest education level: Not on file  Occupational History   Not on file  Tobacco Use   Smoking status: Never   Smokeless tobacco: Never  Vaping Use   Vaping status: Never Used  Substance and Sexual Activity   Alcohol use: No    Alcohol/week: 0.0 standard drinks of alcohol   Drug use: Never   Sexual activity: Not on file  Other Topics Concern   Not on file  Social History Narrative   Not on file   Social Drivers of Health   Financial Resource Strain: Not on file  Food Insecurity: No Food Insecurity (03/14/2023)   Hunger Vital Sign    Worried About Running Out of Food in the Last Year: Never true    Ran Out of Food in the Last Year: Never true  Transportation Needs: No Transportation Needs (03/14/2023)   PRAPARE - Administrator, Civil Service (Medical): No    Lack of Transportation (Non-Medical): No  Physical Activity: Not on file  Stress: Not on file  Social Connections: Not on file  Intimate Partner Violence:  Not At Risk (03/14/2023)   Humiliation, Afraid, Rape, and Kick questionnaire    Fear of Current or Ex-Partner: No    Emotionally Abused: No    Physically Abused: No    Sexually Abused: No   Family History  Problem Relation Age of Onset   Breast cancer Mother    Hypertension Father    CVA Father    Heart failure Sister    CAD Sister    Kidney disease Sister    COPD Brother    Lung cancer Brother    CAD Brother    Diabetes Brother    Stroke Father    Heart attack Neg Hx       VITAL SIGNS BP (!) 140/72   Pulse 80   Temp 98.1 F (36.7 C)   Resp 20   Ht 5' 2 (1.575 m)   Wt 132 lb 3.2 oz (60 kg)   SpO2 97%   BMI 24.18 kg/m   Outpatient Encounter Medications as of 06/01/2024  Medication Sig   acetaminophen  (TYLENOL ) 325 MG tablet Take 650 mg by mouth every 6 (six) hours.   Balsam [place]-Castor Oil (VENELEX) OINT Apply topically.  topical, Every Shift, Apply to sacrum, bilateral  buttocks and bilateral ischial areas qshift for prevention.   Cholecalciferol (VITAMIN D -3) 25 MCG (1000 UT) CAPS Take 1 capsule by mouth daily.   cyanocobalamin  (VITAMIN B12) 1000 MCG tablet Take 2,000 mcg by mouth daily.   levothyroxine  (SYNTHROID ) 50 MCG tablet Take 50 mcg by mouth daily before breakfast.   memantine  (NAMENDA  XR) 7 MG CP24 24 hr capsule Take 7 mg by mouth daily.   Menthol, Topical Analgesic, (BIOFREEZE) 4 % GEL Apply 1 application  topically 3 (three) times daily as needed.   omeprazole (PRILOSEC) 20 MG capsule Take 20 mg by mouth 2 (two) times daily.   oxymetazoline (AFRIN) 0.05 % nasal spray Place 1 spray into both nostrils daily as needed for congestion.   Phenylephrine  HCl (PREPARATION H RE) Place 1 suppository rectally in the morning and at bedtime.   polyethylene glycol (MIRALAX  / GLYCOLAX ) 17 g packet Take 17 g by mouth daily.   sennosides-docusate sodium  (SENOKOT-S) 8.6-50 MG tablet Take 1 tablet by mouth daily.   sertraline  (ZOLOFT ) 25 MG tablet Take 25 mg by mouth daily.   sodium chloride  (OCEAN) 0.65 % SOLN nasal spray Place 1 spray into both nostrils 3 (three) times daily. And as needed   No facility-administered encounter medications on file as of 06/01/2024.     SIGNIFICANT DIAGNOSTIC EXAMS  LABS REVIEWED PREVIOUS      06-06-23: wbc 9.9; hgb 13.8; hct 41.9; mcv 90.5 plt 206; glucose 97; bun 18; creat 1.08; k+ 3.4; na++ 136; ca 8.9 gfr 49; d-dimer 0.56  CRP 4.9 07-29-23: tsh 2.759; vitamin D  34.06 08-21-23: glucose 98; bun 21; creat 0.88; k+ 3.9; na++ 138; ca 9.0; gfr >60 10-18-23: glucose 107; bun 30; creat 1.04; k+ 4.3; na++ 141; ca 9.6; gfr 51  12-30-23: glucose 141; bun 29; creat 0.90; k+ 5.2; na++ 139; ca 9.7; gfr >60  01-06-24: glucose 100; bun 25; creat 0.96; k+ 3.8; na++ 139; ca 9.0; gfr 56 01-30-24: wbc 9.7; hgb 14.2; hct 43.4; mcv 92.1 plt 221; glucose 100; bun 24; creat 0.86; k+ 3.9; na++ 137' ca 9.1; gfr >60; total protein 6.5; albumin 3.4   NO NEW  LABS.   Review of Systems  Constitutional:  Negative for malaise/fatigue.  Respiratory:  Negative for cough and shortness of breath.  Cardiovascular:  Negative for chest pain, palpitations and leg swelling.  Gastrointestinal:  Negative for abdominal pain, constipation and heartburn.  Musculoskeletal:  Negative for back pain, joint pain and myalgias.  Skin: Negative.   Neurological:  Negative for dizziness.  Psychiatric/Behavioral:  The patient is not nervous/anxious.     Physical Exam Constitutional:      General: She is not in acute distress.    Appearance: She is well-developed. She is not diaphoretic.  Neck:     Thyroid : No thyromegaly.  Cardiovascular:     Rate and Rhythm: Normal rate and regular rhythm.     Pulses: Normal pulses.     Heart sounds: Normal heart sounds.  Pulmonary:     Effort: Pulmonary effort is normal. No respiratory distress.     Breath sounds: Normal breath sounds.  Abdominal:     General: Bowel sounds are normal. There is no distension.     Palpations: Abdomen is soft.     Tenderness: There is no abdominal tenderness.  Musculoskeletal:     Cervical back: Neck supple.     Right lower leg: No edema.     Left lower leg: No edema.     Comments: Left hemiplegia  Lymphadenopathy:     Cervical: No cervical adenopathy.  Skin:    General: Skin is warm and dry.  Neurological:     Mental Status: She is alert. Mental status is at baseline.  Psychiatric:        Mood and Affect: Mood normal.     ASSESSMENT/ PLAN:  TODAY  Chronic constipation: will continue miralax  daily   2. Thoracic aortic atherosclerosis (cxr 12-15-14) not on statin due to advanced age.   3. History of CVA/left hemiplegia: is of asa due to GI bleed.    PREVIOUS   4. Generalized chronic pain: will continue tylenol  650 mg every 6 hours   5.Dysphagia: no indications of aspiration present; will continue thin  liquids  6. Stage 3a chronic kidney disease: bun 24; creat 0.86; gfr  >60  7. Gastrointestinal hemorrhage associated with diverticulosis(May 2024)/GERD without esophagitis: hgb stable; the bleeding felt to be related to diverticulosis. Will continue prilosec 20 mg twice daily   8. Interstitial lung disease (HCC) There are no reports of respiratory distress remains on room air   9. HOCM (hypertrophic obstructive cardiomyopathy) Is currently without change in status; will monitor   10. Vascular dementia without behavioral disturbance (HCC) neurocognitive deficits/cerebral atrophy:weight is 132 pounds; will continue  namenda  xr 14 mg daily;  aricept  had been stopped due to weight loss ; is slowly being weaned off namenda    11. Neurocognitive deficits Weight is 126 pounds. Remains on namenda  14 mg daily; is off aricept    12. Cerebral atrophy (HCC) MRI 05-29-21: IMPRESSION: 1. Acute right pontine infarct. Mild associated edema without mass effect. 2. Multiple small remote bilateral cerebellar lacunar infarcts. 3. Mild for age chronic microvascular ischemic disease and moderate atrophy.  13. Somnolence: post stroke: will continue to monitor  14. Hypoalbuminemia due to protein calorie malnutrition: albumin 3.4 will continue supplements as directed  15. Vitamin B 12: deficiency: level 491 will continue 2,000 units daily  16. Vitamin D  deficiency: level 34.06; will continue 1,000 units daily   17. Mixed hyperlipidemia: is off statin due to advanced age  83. Psychosis in elderly without behavioral disturbance: is off seroquel   19. Depression recurrent: will continue zoloft  25 mg daily has failed one wean.     Barnie Seip NP [facility]  call [phone]

## 2024-06-26 ENCOUNTER — Non-Acute Institutional Stay (SKILLED_NURSING_FACILITY): Payer: Self-pay | Admitting: Adult Health

## 2024-06-26 ENCOUNTER — Encounter: Payer: Self-pay | Admitting: Adult Health

## 2024-06-26 DIAGNOSIS — I700 Atherosclerosis of aorta: Secondary | ICD-10-CM | POA: Diagnosis not present

## 2024-06-26 DIAGNOSIS — F0150 Vascular dementia without behavioral disturbance: Secondary | ICD-10-CM

## 2024-06-26 DIAGNOSIS — J849 Interstitial pulmonary disease, unspecified: Secondary | ICD-10-CM | POA: Diagnosis not present

## 2024-06-26 NOTE — Progress Notes (Signed)
 Location:  [facility] Room Number: [id number] Place of Service:  SNF (31)   CODE STATUS: DNR  Allergies  Allergen Reactions   Ace Inhibitors     Hyperkalemia    Hydrochlorothiazide     05/29/2021 K+ 2.6 Other reaction(s): Other (See Comments) 05/29/2021 K+ 2.6   Codeine Nausea Only    Chief Complaint  Patient presents with   Acute Visit    Acute plan meeting    HPI:  We have come together for her care plan meeting. BIMS 7/15 mood 0/30. Uses wheelchair no falls. She requires moderate to dependent assist with her adls care. She is frequently incontinent of bladder and bowel. Dietary: setup for meals; regular diet appetite 26-100%; weight is 132.3 pounds therapy: none at this time. Activities: does participate. She will continue to be followed for her chronic illnesses including: Thoracic aortic atherosclerosis  Interstitial lung disease    Vascular dementia without behavioral disturbance  Past Medical History:  Diagnosis Date   Decreased sense of smell    and taste-negative CT scan-2011   Depression    Essential hypertension    GERD (gastroesophageal reflux disease)    Heart murmur    heard first time 4/12   History of mammogram    2010 and she does not want to repeat them anymore   HOCM (hypertrophic obstructive cardiomyopathy) (HCC)    a. Dx by echo 05/2014.   Hypothyroidism    IBS (irritable bowel syndrome)    Impaired fasting glucose    Low back pain    Lumbar radiculopathy    with negative MRI (1991)   Postmenopausal    with osteopenia, bisphosphonates 1/05-1/11, improved osteopenia 4/12-repeat planned late 2015   Shingles    2015   Thyroid  nodule    Vitamin D  deficiency    repleted on weekly vitamin D  50000 iu    Past Surgical History:  Procedure Laterality Date   ABDOMINAL HYSTERECTOMY     BREAST BIOPSY     x2   CATARACT EXTRACTION     removal with IOL bilateral   GLAUCOMA SURGERY     laser   skin cancer removal     THYROIDECTOMY      subtotal    Social History   Socioeconomic History   Marital status: Married    Spouse name: Not on file   Number of children: Not on file   Years of education: Not on file   Highest education level: Not on file  Occupational History   Not on file  Tobacco Use   Smoking status: Never   Smokeless tobacco: Never  Vaping Use   Vaping status: Never Used  Substance and Sexual Activity   Alcohol use: No    Alcohol/week: 0.0 standard drinks of alcohol   Drug use: Never   Sexual activity: Not on file  Other Topics Concern   Not on file  Social History Narrative   Not on file   Social Drivers of Health   Financial Resource Strain: Not on file  Food Insecurity: No Food Insecurity (03/14/2023)   Hunger Vital Sign    Worried About Running Out of Food in the Last Year: Never true    Ran Out of Food in the Last Year: Never true  Transportation Needs: No Transportation Needs (03/14/2023)   PRAPARE - Administrator, Civil Service (Medical): No    Lack of Transportation (Non-Medical): No  Physical Activity: Not on file  Stress: Not on  file  Social Connections: Not on file  Intimate Partner Violence: Not At Risk (03/14/2023)   Humiliation, Afraid, Rape, and Kick questionnaire    Fear of Current or Ex-Partner: No    Emotionally Abused: No    Physically Abused: No    Sexually Abused: No   Family History  Problem Relation Age of Onset   Breast cancer Mother    Hypertension Father    CVA Father    Heart failure Sister    CAD Sister    Kidney disease Sister    COPD Brother    Lung cancer Brother    CAD Brother    Diabetes Brother    Stroke Father    Heart attack Neg Hx       VITAL SIGNS BP 134/72   Pulse 86   Temp 98.1 F (36.7 C)   Resp 20   Ht 5' 2 (1.575 m)   Wt 132 lb 3.2 oz (60 kg)   SpO2 96%   BMI 24.18 kg/m   Outpatient Encounter Medications as of 06/26/2024  Medication Sig   acetaminophen  (TYLENOL ) 325 MG tablet Take 650 mg by mouth 3 (three)  times daily.   Balsam [place]-Castor Oil (VENELEX) OINT Apply topically.  topical, Every Shift, Apply to sacrum, bilateral buttocks and bilateral ischial areas qshift for prevention.   Cholecalciferol (VITAMIN D -3) 25 MCG (1000 UT) CAPS Take 1 capsule by mouth daily.   cyanocobalamin  (VITAMIN B12) 1000 MCG tablet Take 2,000 mcg by mouth daily.   levothyroxine  (SYNTHROID ) 50 MCG tablet Take 50 mcg by mouth daily before breakfast.   memantine  (NAMENDA  XR) 7 MG CP24 24 hr capsule Take 7 mg by mouth daily.   Menthol, Topical Analgesic, (BIOFREEZE) 4 % GEL Apply 1 application  topically 3 (three) times daily as needed.   omeprazole (PRILOSEC) 20 MG capsule Take 20 mg by mouth 2 (two) times daily.   oxymetazoline (AFRIN) 0.05 % nasal spray Place 1 spray into both nostrils daily as needed for congestion.   Phenylephrine  HCl (PREPARATION H RE) Place 1 suppository rectally at bedtime.  Insert one rectally nightly for hemorrhoids As Needed PRN 1, PRN 2, PRN 3, PRN 4, PRN 5, PRN 6   polyethylene glycol (MIRALAX  / GLYCOLAX ) 17 g packet Take 17 g by mouth daily.   sennosides-docusate sodium  (SENOKOT-S) 8.6-50 MG tablet Take 1 tablet by mouth daily.   sertraline  (ZOLOFT ) 25 MG tablet Take 25 mg by mouth daily.   sodium chloride  (OCEAN) 0.65 % SOLN nasal spray Place 1 spray into both nostrils daily. And as needed   No facility-administered encounter medications on file as of 06/26/2024.     SIGNIFICANT DIAGNOSTIC EXAMS  LABS REVIEWED PREVIOUS      07-29-23: tsh 2.759; vitamin D  34.06 08-21-23: glucose 98; bun 21; creat 0.88; k+ 3.9; na++ 138; ca 9.0; gfr >60 10-18-23: glucose 107; bun 30; creat 1.04; k+ 4.3; na++ 141; ca 9.6; gfr 51  12-30-23: glucose 141; bun 29; creat 0.90; k+ 5.2; na++ 139; ca 9.7; gfr >60  01-06-24: glucose 100; bun 25; creat 0.96; k+ 3.8; na++ 139; ca 9.0; gfr 56 01-30-24: wbc 9.7; hgb 14.2; hct 43.4; mcv 92.1 plt 221; glucose 100; bun 24; creat 0.86; k+ 3.9; na++ 137' ca 9.1; gfr >60;  total protein 6.5; albumin 3.4   NO NEW LABS.   Review of Systems  Constitutional:  Negative for malaise/fatigue.  Respiratory:  Negative for cough and shortness of breath.   Cardiovascular:  Negative for  chest pain, palpitations and leg swelling.  Gastrointestinal:  Negative for abdominal pain, constipation and heartburn.  Musculoskeletal:  Negative for back pain, joint pain and myalgias.  Skin: Negative.   Neurological:  Negative for dizziness.  Psychiatric/Behavioral:  The patient is not nervous/anxious.     Physical Exam Constitutional:      General: She is not in acute distress.    Appearance: She is well-developed. She is not diaphoretic.  Neck:     Thyroid : No thyromegaly.  Cardiovascular:     Rate and Rhythm: Normal rate and regular rhythm.     Heart sounds: Normal heart sounds.  Pulmonary:     Effort: Pulmonary effort is normal. No respiratory distress.     Breath sounds: Normal breath sounds.  Abdominal:     General: Bowel sounds are normal. There is no distension.     Palpations: Abdomen is soft.     Tenderness: There is no abdominal tenderness.  Musculoskeletal:     Cervical back: Neck supple.     Right lower leg: No edema.     Left lower leg: No edema.     Comments:  Left hemiplegia  Lymphadenopathy:     Cervical: No cervical adenopathy.  Skin:    General: Skin is warm and dry.  Neurological:     Mental Status: She is alert. Mental status is at baseline.  Psychiatric:        Mood and Affect: Mood normal.     ASSESSMENT/ PLAN:  TODAY  Thoracic aortic atherosclerosis Interstitial lung disease Vascular dementia without behavioral disturbance  Will continue current medications Will continue current plan of care Will monitor her status.  Time spent with patient: 40 minutes: medications; dietary; activities.    Barnie Seip NP [facility]  call [phone]

## 2024-07-03 ENCOUNTER — Non-Acute Institutional Stay (SKILLED_NURSING_FACILITY): Payer: Self-pay | Admitting: Internal Medicine

## 2024-07-03 ENCOUNTER — Encounter: Payer: Self-pay | Admitting: Internal Medicine

## 2024-07-03 DIAGNOSIS — E441 Mild protein-calorie malnutrition: Secondary | ICD-10-CM | POA: Diagnosis not present

## 2024-07-03 DIAGNOSIS — E890 Postprocedural hypothyroidism: Secondary | ICD-10-CM

## 2024-07-03 DIAGNOSIS — E559 Vitamin D deficiency, unspecified: Secondary | ICD-10-CM | POA: Diagnosis not present

## 2024-07-03 DIAGNOSIS — E46 Unspecified protein-calorie malnutrition: Secondary | ICD-10-CM | POA: Insufficient documentation

## 2024-07-03 DIAGNOSIS — F0150 Vascular dementia without behavioral disturbance: Secondary | ICD-10-CM

## 2024-07-03 DIAGNOSIS — E538 Deficiency of other specified B group vitamins: Secondary | ICD-10-CM | POA: Diagnosis not present

## 2024-07-03 NOTE — Progress Notes (Unsigned)
 NURSING HOME LOCATION:  [facility] ROOM NUMBER:  [id number] P  CODE STATUS:  DNR  PCP: Landy Barnie RAMAN, NP   This is a nursing facility follow up visit of chronic medical diagnoses to document compliance with Regulation 483.30 (c) in The Long Term Care Survey Manual Phase 2 which mandates caregiver visit ( visits can alternate among physician, PA or NP as per statutes) within 10 days of 30 days / 60 days/ 90 days post admission to SNF date  .  Interim medical record and care since last SNF visit was updated with review of diagnostic studies and change in clinical status since last visit were documented.  HPI: She is a permanent resident of this facility with medical diagnoses of vitamin D  deficiency; vitamin B12 deficiency; PMH of CVA;hypothyroidism; GERD; essential hypertension; hypertrophic obstructive cardiomyopathy; and IBS.  Significant surgical history includes thyroidectomy and a breast biopsy x 2. Most current labs were completed 01/30/2024 and revealed normal hyperglycemia with glucose of 100.  There was also minimal prerenal azotemia with a BUN of 24.  Creatinine was 0.86 and GFR greater than 60 indicating CKD stage II.  Albumin was 3.4 and total protein low normal at 6.5.  CBC was normal. She has a history of thyroidectomy; the most recent TSH on record was therapeutic at 2.759 on 07/29/2023.  She is on L-thyroxine 50 mcg daily. Vitamin D , 25 OH was 34.06 on that date. Most recent B12 was 491 on 04/29/23.   Review of systems: Dementia invalidated responses.  Her 2 daughters, who are incredible care providers, were present.  She was unable to tell me the sex of her third child in addition to the  twin daughters.  She has no active complaints.  Her daughters validate that she exhibits some sundowning behavior in the afternoons at times with some variation in mood.  Constitutional: No fever, significant weight change, fatigue  Eyes: No redness, discharge, pain, vision  change ENT/mouth: No nasal congestion,  purulent discharge, earache, change in hearing, sore throat  Cardiovascular: No chest pain, palpitations, paroxysmal nocturnal dyspnea, edema  Respiratory: No cough, sputum production, hemoptysis, DOE, significant snoring, apnea   Gastrointestinal: No heartburn, dysphagia, abdominal pain, nausea /vomiting, rectal bleeding, melena, change in bowels Genitourinary: No dysuria, hematuria, pyuria, incontinence, nocturia Musculoskeletal: No joint stiffness, joint swelling, pain Dermatologic: No rash, pruritus, change in appearance of skin Neurologic: No dizziness, headache, syncope, seizures, numbness, tingling Psychiatric: No significant anxiety, depression, insomnia, anorexia Endocrine: No change in hair/skin/nails, excessive thirst, excessive hunger, excessive urination  Hematologic/lymphatic: No significant bruising, lymphadenopathy, abnormal bleeding Allergy/immunology: No itchy/watery eyes, significant sneezing, urticaria, angioedema  Physical exam:  Pertinent or positive findings: She appears her age.  Dental hygiene is extremely good.  First heart sound is increased.  She has coarse rales greatest in the right lower lobe with minimal change in the left lower lobe.  Abdomen is minimally protuberant.  She has trace edema at the sock line.  Dorsalis pedis pulses are stronger than posterior tibial pulses.  The left upper extremity is in a sling; there is essentially no strength to opposition in the left upper extremity.  General appearance:  no acute distress, increased work of breathing is present.   Lymphatic: No lymphadenopathy about the head, neck, axilla. Eyes: No conjunctival inflammation or lid edema is present. There is no scleral icterus. Ears:  External ear exam shows no significant lesions or deformities.   Nose:  External nasal examination shows no deformity or inflammation.  Nasal mucosa are pink and moist without lesions, exudates Neck:  No  thyromegaly, masses, tenderness noted.    Heart:  Normal rate and regular rhythm without gallop, murmur, click, rub .  Lungs:  without wheezes, rhonchi, rubs. Abdomen: Bowel sounds are normal. Abdomen is soft and nontender with no organomegaly, hernias, masses. GU: Deferred  Extremities:  No cyanosis, clubbing  Neurologic exam :Balance, Rhomberg, finger to nose testing could not be completed due to clinical state Skin: Warm & dry w/o tenting. No significant lesions or rash.  See summary under each active problem in the Problem List with associated updated therapeutic plan:  Vitamin B 12 deficiency 04/29/2023 B12 491. She is not on supplementation. Update with TSH next month.  Vitamin D  deficiency 07/29/23 vit D,25 OH 34.06. Update indicated.  Hypothyroidism 07/29/23 TSH therapeutic @ 2.759 on 50 mcg every day. Update TSH next month.  Unspecified protein-calorie malnutrition (HCC) Current albumin 3.4 & total protein 6.4. Nutrition to monitor.  Vascular dementia without behavioral disturbance (HCC) She is oriented only to self. For the most part she is pleasantly confused, but can be feisty as manifestation of sundowning w/o other significant behavioral pattern as per her daughters. She is always delightfully pleasant during my interactions.

## 2024-07-03 NOTE — Assessment & Plan Note (Signed)
 7/824 B12 491. She is not on supplementation.

## 2024-07-03 NOTE — Assessment & Plan Note (Addendum)
 07/29/23 TSH therapeutic @ 2.759 on 50 mcg every day. Update TSH next month.

## 2024-07-03 NOTE — Patient Instructions (Signed)
 See assessment and plan under each diagnosis in the problem list and acutely for this visit

## 2024-07-03 NOTE — Assessment & Plan Note (Signed)
 Current albumin 3.4 & total protein 6.4. Nutrition to monitor.

## 2024-07-03 NOTE — Assessment & Plan Note (Signed)
 07/29/23 vit D,25 OH 34.06. Update indicated.

## 2024-07-04 NOTE — Assessment & Plan Note (Signed)
 She is oriented only to self. For the most part she is pleasantly confused, but can be feisty as manifestation of sundowning w/o other significant behavioral pattern as per her daughters. She is always delightfully pleasant during my interactions.

## 2024-07-21 ENCOUNTER — Encounter (HOSPITAL_COMMUNITY): Payer: Self-pay

## 2024-07-21 ENCOUNTER — Emergency Department (HOSPITAL_COMMUNITY)

## 2024-07-21 ENCOUNTER — Other Ambulatory Visit: Payer: Self-pay

## 2024-07-21 ENCOUNTER — Emergency Department (HOSPITAL_COMMUNITY)
Admission: EM | Admit: 2024-07-21 | Discharge: 2024-07-21 | Disposition: A | Discharge status: Home / Self Care | Attending: Emergency Medicine | Admitting: Emergency Medicine

## 2024-07-21 DIAGNOSIS — M542 Cervicalgia: Secondary | ICD-10-CM | POA: Diagnosis not present

## 2024-07-21 DIAGNOSIS — W19XXXA Unspecified fall, initial encounter: Secondary | ICD-10-CM | POA: Insufficient documentation

## 2024-07-21 DIAGNOSIS — S0101XA Laceration without foreign body of scalp, initial encounter: Secondary | ICD-10-CM | POA: Insufficient documentation

## 2024-07-21 DIAGNOSIS — Z23 Encounter for immunization: Secondary | ICD-10-CM | POA: Diagnosis not present

## 2024-07-21 DIAGNOSIS — S0990XA Unspecified injury of head, initial encounter: Secondary | ICD-10-CM | POA: Diagnosis present

## 2024-07-21 DIAGNOSIS — Y92002 Bathroom of unspecified non-institutional (private) residence single-family (private) house as the place of occurrence of the external cause: Secondary | ICD-10-CM | POA: Diagnosis not present

## 2024-07-21 LAB — CBC WITH DIFFERENTIAL/PLATELET
Abs Immature Granulocytes: 0.05 K/uL (ref 0.00–0.07)
Basophils Absolute: 0 K/uL (ref 0.0–0.1)
Basophils Relative: 0 %
Eosinophils Absolute: 0.2 K/uL (ref 0.0–0.5)
Eosinophils Relative: 2 %
HCT: 40.9 % (ref 36.0–46.0)
Hemoglobin: 13.2 g/dL (ref 12.0–15.0)
Immature Granulocytes: 1 %
Lymphocytes Relative: 23 %
Lymphs Abs: 2.5 K/uL (ref 0.7–4.0)
MCH: 30.1 pg (ref 26.0–34.0)
MCHC: 32.3 g/dL (ref 30.0–36.0)
MCV: 93.2 fL (ref 80.0–100.0)
Monocytes Absolute: 0.8 K/uL (ref 0.1–1.0)
Monocytes Relative: 7 %
Neutro Abs: 7.4 K/uL (ref 1.7–7.7)
Neutrophils Relative %: 67 %
Platelets: 217 K/uL (ref 150–400)
RBC: 4.39 MIL/uL (ref 3.87–5.11)
RDW: 13.7 % (ref 11.5–15.5)
WBC: 10.9 K/uL — ABNORMAL HIGH (ref 4.0–10.5)
nRBC: 0 % (ref 0.0–0.2)

## 2024-07-21 LAB — COMPREHENSIVE METABOLIC PANEL WITH GFR
ALT: 7 U/L (ref 0–44)
AST: 16 U/L (ref 15–41)
Albumin: 3.9 g/dL (ref 3.5–5.0)
Alkaline Phosphatase: 59 U/L (ref 38–126)
Anion gap: 12 (ref 5–15)
BUN: 19 mg/dL (ref 8–23)
CO2: 23 mmol/L (ref 22–32)
Calcium: 9.4 mg/dL (ref 8.9–10.3)
Chloride: 105 mmol/L (ref 98–111)
Creatinine, Ser: 0.98 mg/dL (ref 0.44–1.00)
GFR, Estimated: 54 mL/min — ABNORMAL LOW (ref >60)
Glucose, Bld: 124 mg/dL — ABNORMAL HIGH (ref 70–99)
Potassium: 3.8 mmol/L (ref 3.5–5.1)
Sodium: 140 mmol/L (ref 135–145)
Total Bilirubin: 0.3 mg/dL (ref 0.0–1.2)
Total Protein: 6.7 g/dL (ref 6.5–8.1)

## 2024-07-21 MED ORDER — ACETAMINOPHEN 325 MG PO TABS
650.0000 mg | ORAL_TABLET | Freq: Once | ORAL | Status: AC
  Administered 2024-07-21: 650 mg via ORAL
  Filled 2024-07-21: qty 2

## 2024-07-21 MED ORDER — TETANUS-DIPHTH-ACELL PERTUSSIS 5-2.5-18.5 LF-MCG/0.5 IM SUSY
0.5000 mL | PREFILLED_SYRINGE | Freq: Once | INTRAMUSCULAR | Status: AC
  Administered 2024-07-21: 0.5 mL via INTRAMUSCULAR
  Filled 2024-07-21: qty 0.5

## 2024-07-21 NOTE — ED Notes (Signed)
 Pt in bed, pt has no complaints at this time, read and reviewed d/c instructions with pt and family, all verbalized understanding d/c and follow up.

## 2024-07-21 NOTE — ED Notes (Signed)
 Patient transported to CT

## 2024-07-21 NOTE — ED Provider Notes (Signed)
  EMERGENCY DEPARTMENT AT [facility] Provider Note   CSN: [id number] Arrival date & time: 07/21/24  1055     Patient presents with: Vickie Mckee is a 89 y.o. female.   Patient is a 89 year old female who presents emergency department following a fall which occurred at her long-term care facility.  Patient was attempting to ambulate to the bathroom and fell.  She complains of pain to the posterior aspect of her scalp and neck.  She denies any long bone or joint pain at this time.  She does have a superficial laceration to the posterior aspect of her scalp.  Patient denies any chest pain or abdominal pain.  She denies any active dizziness or lightheadedness.   Fall Associated symptoms include headaches.       Prior to Admission medications   Medication Sig Start Date End Date Taking? Authorizing Provider  acetaminophen  (TYLENOL ) 325 MG tablet Take 650 mg by mouth 3 (three) times daily.    [provider]  Victorio Spanner Oil [facility]) OINT Apply topically.  topical, Every Shift, Apply to sacrum, bilateral buttocks and bilateral ischial areas qshift for prevention.    [provider]  Cholecalciferol (VITAMIN D -3) 25 MCG (1000 UT) CAPS Take 1 capsule by mouth daily.    [provider]  cyanocobalamin  (VITAMIN B12) 1000 MCG tablet Take 2,000 mcg by mouth daily.    [provider]  levothyroxine  (SYNTHROID ) 50 MCG tablet Take 50 mcg by mouth daily before breakfast. 07/14/14   [provider]  memantine  (NAMENDA  XR) 7 MG CP24 24 hr capsule Take 7 mg by mouth daily.    [provider]  Menthol, Topical Analgesic, (BIOFREEZE) 4 % GEL Apply 1 application  topically 3 (three) times daily as needed.    [provider]  omeprazole (PRILOSEC) 20 MG capsule Take 20 mg by mouth 2 (two) times daily.    [provider]  oxymetazoline (AFRIN) 0.05 % nasal spray Place 1 spray into both nostrils daily  as needed for congestion.    [provider]  Phenylephrine  HCl (PREPARATION H RE) Place 1 suppository rectally at bedtime.  Insert one rectally nightly for hemorrhoids As Needed PRN 1, PRN 2, PRN 3, PRN 4, PRN 5, PRN 6    [provider]  polyethylene glycol (MIRALAX  / GLYCOLAX ) 17 g packet Take 17 g by mouth daily. 06/01/21   Pearlean Manus, MD  sennosides-docusate sodium  (SENOKOT-S) 8.6-50 MG tablet Take 1 tablet by mouth daily.    [provider]  sertraline  (ZOLOFT ) 25 MG tablet Take 25 mg by mouth daily.    [provider]  sodium chloride  (OCEAN) 0.65 % SOLN nasal spray Place 1 spray into both nostrils daily. And as needed    [provider]    Allergies: Ace inhibitors, Hydrochlorothiazide, and Codeine    Review of Systems  Musculoskeletal:  Positive for neck pain.  Neurological:  Positive for headaches.  All other systems reviewed and are negative.   Updated Vital Signs BP 128/87 (BP Location: Right Arm)   Pulse 84   Temp 98.1 F (36.7 C) (Oral)   Resp 18   Ht 5' 2 (1.575 m)   Wt 62.6 kg   SpO2 94%   BMI 25.24 kg/m   Physical Exam Vitals and nursing note reviewed.  Constitutional:      General: She is not in acute distress.    Appearance: Normal appearance. She is not ill-appearing.  HENT:     Head: Normocephalic and atraumatic.     Nose: Nose normal.     Mouth/Throat:     Mouth: Mucous membranes are moist.  Eyes:     Extraocular Movements: Extraocular movements intact.     Conjunctiva/sclera: Conjunctivae normal.     Pupils: Pupils are equal, round, and reactive to light.  Neck:     Comments: Mild midline tenderness, no step-off or deformity Cardiovascular:     Rate and Rhythm: Normal rate and regular rhythm.     Pulses: Normal pulses.     Heart sounds: Normal heart sounds. No murmur heard.    No gallop.  Pulmonary:     Effort: Pulmonary effort is normal. No respiratory distress.     Breath sounds: Normal  breath sounds. No stridor. No wheezing, rhonchi or rales.  Chest:     Chest wall: No tenderness.  Abdominal:     General: Abdomen is flat. Bowel sounds are normal. There is no distension.     Palpations: Abdomen is soft.     Tenderness: There is no abdominal tenderness. There is no guarding.  Musculoskeletal:        General: Normal range of motion.     Cervical back: Normal range of motion and neck supple. No rigidity.     Comments: Tenderness palpation noted over the upper thoracic spine, nontender palpation over remainder of thoracic spine and lumbar spine, no step-off or deformity, nontender palpation of her bilateral upper and lower extremities, pelvis stable to AP lateral compression, peripheral pulses 2+, sensation intact distally, left shoulder is currently in a sling.  No obvious deformity or bruising, no skin breakdown or ulceration, no lacerations or abrasions  Skin:    General: Skin is warm and dry.     Findings: No rash.     Comments: Superficial laceration noted to the posterior aspect of the scalp  Neurological:     General: No focal deficit present.     Mental Status: She is alert and oriented to person, place, and time. Mental status is at baseline.     Cranial Nerves: No cranial nerve deficit.     Sensory: No sensory deficit.     Motor: No weakness.     Coordination: Coordination normal.  Psychiatric:        Mood and Affect: Mood normal.        Behavior: Behavior normal.        Thought Content: Thought content normal.        Judgment: Judgment normal.     (all labs ordered are listed, but only abnormal results are displayed) Labs Reviewed  COMPREHENSIVE METABOLIC PANEL WITH GFR  CBC WITH DIFFERENTIAL/PLATELET    EKG: None  Radiology: No results found.   Procedures   Medications Ordered in the ED  Tdap (BOOSTRIX) injection 0.5 mL (has no administration in time range)                                    Medical Decision Making Amount and/or  Complexity of Data Reviewed Labs: ordered. Radiology: ordered.  Risk OTC drugs. Prescription drug management.   This patient presents to the ED for concern of fall, headache differential diagnosis includes intracranial hemorrhage, scalp laceration, contusion, lumbar joint fracture, vertebral fracture, rib fracture    Additional history obtained:  Additional history obtained from none External records from outside source obtained and reviewed including none  Lab Tests:  I Ordered, and personally interpreted labs.  The pertinent results include: Mild leukocytosis, no anemia, normal kidney function liver function, normal electrolytes   Imaging Studies ordered:  I ordered imaging studies including CT scan head, cervical spine, thoracic spine, x-ray of left shoulder, chest, left hip and pelvis I independently visualized and interpreted imaging which showed no acute intracranial hemorrhage, no vertebral fracture, no acute osseous injury noted to left shoulder, hip, no acute cardiopulmonary process I agree with the radiologist interpretation   Medicines ordered and prescription drug management:  I ordered medication including Tylenol , Tdap for laceration and headache Reevaluation of the patient after these medicines showed that the patient improved I have reviewed the patients home medicines and have made adjustments as needed   Problem List / ED Course:  Patient is doing very well at this time and is stable for discharge home.  Family is currently present at the bedside and noting that she is mentating at her baseline.  She did have a very superficial laceration noted to the posterior aspect of the scalp which did not warrant suturing or staples.  All imaging performed was unremarkable.  She was nontender palpation of remainder of long bones and joints.  X-ray of left hip order was placed by nursing staff but patient has no tenderness over the hip or pain with range of motion at  this time.  Do not suspect that CT scan is warranted.  She was not tender palpation of her lumbar spine.  She was nontender palpation of her chest wall and abdomen.  Do not suspect that further imaging is warranted at this time.  Close follow-up with primary care doctor was discussed as well as strict turn precautions for any new or worsening symptoms.  Family and patient voiced understanding and had no additional questions.   Social Determinants of Health:  Lives at long-term care facility        Final diagnoses:  None    ED Discharge Orders     None          Daralene Lonni JONETTA DEVONNA 07/21/24 1411    Suzette Pac, MD 07/22/24 1654

## 2024-07-21 NOTE — ED Notes (Signed)
 Pt in bed, cleaned pt's head, pt has small aprox 1/2 abrasion/lac on her head, no bleeding at this time.

## 2024-07-21 NOTE — ED Notes (Signed)
 Pt from department with [facility] center RN Delon.

## 2024-07-21 NOTE — ED Notes (Addendum)
 Report called to [facility] center at [phone] RN Delon

## 2024-07-21 NOTE — ED Notes (Signed)
 Pt in bed, daughters at bedside, pt oriented to self, re oriented pt, daughters states that pt is a resident at the [facility] center.

## 2024-07-21 NOTE — Discharge Instructions (Signed)
 Please follow-up closely with primary care doctor on an outpatient basis.  Return to emergency department immediately for any new or worsening symptoms.

## 2024-07-21 NOTE — ED Triage Notes (Signed)
 Pt BIB ems from a Fall. Per facility pt tried ambulating to the bathroom without assistance and fell. Pt states headache during triage. Laceration noted to back of the head. Pt on aspirin . Pt has hx of stroke left arm deficit. Pt also has hx of dementia A & O times 2.

## 2024-08-13 ENCOUNTER — Non-Acute Institutional Stay (SKILLED_NURSING_FACILITY): Payer: Self-pay | Admitting: Adult Health

## 2024-08-13 ENCOUNTER — Encounter: Payer: Self-pay | Admitting: Adult Health

## 2024-08-13 DIAGNOSIS — I69391 Dysphagia following cerebral infarction: Secondary | ICD-10-CM | POA: Diagnosis not present

## 2024-08-13 DIAGNOSIS — N1831 Chronic kidney disease, stage 3a: Secondary | ICD-10-CM

## 2024-08-13 DIAGNOSIS — G8929 Other chronic pain: Secondary | ICD-10-CM

## 2024-08-17 ENCOUNTER — Other Ambulatory Visit (HOSPITAL_COMMUNITY)
Admission: RE | Admit: 2024-08-17 | Discharge: 2024-08-17 | Disposition: A | Discharge status: Home / Self Care | Source: Skilled Nursing Facility | Attending: Adult Health | Admitting: Adult Health

## 2024-08-17 DIAGNOSIS — D649 Anemia, unspecified: Secondary | ICD-10-CM | POA: Diagnosis present

## 2024-08-17 LAB — TSH: TSH: 3.92 u[IU]/mL (ref 0.350–4.500)

## 2024-08-17 LAB — VITAMIN D 25 HYDROXY (VIT D DEFICIENCY, FRACTURES): Vit D, 25-Hydroxy: 25.3 ng/mL — ABNORMAL LOW (ref 30–100)

## 2024-08-17 LAB — VITAMIN B12: Vitamin B-12: 1273 pg/mL — ABNORMAL HIGH (ref 180–914)

## 2024-08-17 NOTE — Progress Notes (Signed)
 Location:  [facility] Room Number: [id number] Place of Service:  SNF (31)   CODE STATUS: dnr   Allergies  Allergen Reactions   Ace Inhibitors     Hyperkalemia    Hydrochlorothiazide     05/29/2021 K+ 2.6 Other reaction(s): Other (See Comments) 05/29/2021 K+ 2.6   Codeine Nausea Only    Chief Complaint  Patient presents with   Medical Management of Chronic Issues              Chronic pain syndrome:  Dysphagia: Stage 3a chronic kidney disease    HPI:  She is a 89 y.o. long term resident of this facility being seen for the management of her chronic illnesses:Chronic pain syndrome:  Dysphagia: Stage 3a chronic kidney disease. No reports of uncontrolled pain. No reports of aspiration present. No reports of anxiety present.    Past Medical History:  Diagnosis Date   Decreased sense of smell    and taste-negative CT scan-2011   Depression    Essential hypertension    GERD (gastroesophageal reflux disease)    Heart murmur    heard first time 4/12   History of mammogram    2010 and she does not want to repeat them anymore   HOCM (hypertrophic obstructive cardiomyopathy) (HCC)    a. Dx by echo 05/2014.   Hypothyroidism    IBS (irritable bowel syndrome)    Impaired fasting glucose    Low back pain    Lumbar radiculopathy    with negative MRI (1991)   Postmenopausal    with osteopenia, bisphosphonates 1/05-1/11, improved osteopenia 4/12-repeat planned late 2015   Shingles    2015   Thyroid  nodule    Vitamin D  deficiency    repleted on weekly vitamin D  50000 iu    Past Surgical History:  Procedure Laterality Date   ABDOMINAL HYSTERECTOMY     BREAST BIOPSY     x2   CATARACT EXTRACTION     removal with IOL bilateral   GLAUCOMA SURGERY     laser   skin cancer removal     THYROIDECTOMY     subtotal    Social History   Socioeconomic History   Marital status: Married    Spouse name: Not on file   Number of children: Not on file   Years of  education: Not on file   Highest education level: Not on file  Occupational History   Not on file  Tobacco Use   Smoking status: Never   Smokeless tobacco: Never  Vaping Use   Vaping status: Never Used  Substance and Sexual Activity   Alcohol use: No    Alcohol/week: 0.0 standard drinks of alcohol   Drug use: Never   Sexual activity: Not on file  Other Topics Concern   Not on file  Social History Narrative   Not on file   Social Drivers of Health   Financial Resource Strain: Not on file  Food Insecurity: No Food Insecurity (03/14/2023)   Hunger Vital Sign    Worried About Running Out of Food in the Last Year: Never true    Ran Out of Food in the Last Year: Never true  Transportation Needs: No Transportation Needs (03/14/2023)   PRAPARE - Administrator, Civil Service (Medical): No    Lack of Transportation (Non-Medical): No  Physical Activity: Not on file  Stress: Not on file  Social Connections: Not on file  Intimate Partner Violence: Not At  Risk (03/14/2023)   Humiliation, Afraid, Rape, and Kick questionnaire    Fear of Current or Ex-Partner: No    Emotionally Abused: No    Physically Abused: No    Sexually Abused: No   Family History  Problem Relation Age of Onset   Breast cancer Mother    Hypertension Father    CVA Father    Heart failure Sister    CAD Sister    Kidney disease Sister    COPD Brother    Lung cancer Brother    CAD Brother    Diabetes Brother    Stroke Father    Heart attack Neg Hx       VITAL SIGNS BP (!) 140/79   Pulse 89   Temp 97.9 F (36.6 C)   Resp 20   Ht 5' 2 (1.575 m)   Wt 137 lb 6.4 oz (62.3 kg)   SpO2 94%   BMI 25.13 kg/m   Outpatient Encounter Medications as of 08/13/2024  Medication Sig   acetaminophen  (TYLENOL ) 325 MG tablet Take 650 mg by mouth 3 (three) times daily.   Balsam [place]-Castor Oil (VENELEX) OINT Apply topically.  topical, Every Shift, Apply to sacrum, bilateral buttocks and bilateral ischial  areas qshift for prevention.   Cholecalciferol (VITAMIN D -3) 25 MCG (1000 UT) CAPS Take 1 capsule by mouth daily.   cyanocobalamin  (VITAMIN B12) 1000 MCG tablet Take 2,000 mcg by mouth daily.   levothyroxine  (SYNTHROID ) 50 MCG tablet Take 50 mcg by mouth daily before breakfast.   memantine  (NAMENDA  XR) 7 MG CP24 24 hr capsule Take 7 mg by mouth daily.   Menthol, Topical Analgesic, (BIOFREEZE) 4 % GEL Apply 1 application  topically 3 (three) times daily as needed.   omeprazole (PRILOSEC) 20 MG capsule Take 20 mg by mouth 2 (two) times daily.   oxymetazoline (AFRIN) 0.05 % nasal spray Place 1 spray into both nostrils daily as needed for congestion.   Phenylephrine  HCl (PREPARATION H RE) Place 1 suppository rectally at bedtime.  Insert one rectally nightly for hemorrhoids As Needed PRN 1, PRN 2, PRN 3, PRN 4, PRN 5, PRN 6   polyethylene glycol (MIRALAX  / GLYCOLAX ) 17 g packet Take 17 g by mouth daily.   sennosides-docusate sodium  (SENOKOT-S) 8.6-50 MG tablet Take 1 tablet by mouth daily.   sertraline  (ZOLOFT ) 25 MG tablet Take 25 mg by mouth daily.   sodium chloride  (OCEAN) 0.65 % SOLN nasal spray Place 1 spray into both nostrils daily. And as needed   No facility-administered encounter medications on file as of 08/13/2024.     SIGNIFICANT DIAGNOSTIC EXAMS  LABS REVIEWED PREVIOUS      07-29-23: tsh 2.759; vitamin D  34.06 08-21-23: glucose 98; bun 21; creat 0.88; k+ 3.9; na++ 138; ca 9.0; gfr >60 10-18-23: glucose 107; bun 30; creat 1.04; k+ 4.3; na++ 141; ca 9.6; gfr 51  12-30-23: glucose 141; bun 29; creat 0.90; k+ 5.2; na++ 139; ca 9.7; gfr >60  01-06-24: glucose 100; bun 25; creat 0.96; k+ 3.8; na++ 139; ca 9.0; gfr 56 01-30-24: wbc 9.7; hgb 14.2; hct 43.4; mcv 92.1 plt 221; glucose 100; bun 24; creat 0.86; k+ 3.9; na++ 137' ca 9.1; gfr >60; total protein 6.5; albumin 3.4   TODAY  07-21-24: wbc 10.9; hgb 13.2; hct 40.9; mcv 93.2 plt 217; glucose 124; bun 19;c reat 0.98; k+ 3.8; na++ 140;  ca 9.4 gfr 54; protein 6.7 albumin 3.9     Review of Systems  Constitutional:  Negative  for malaise/fatigue.  Respiratory:  Negative for cough and shortness of breath.   Cardiovascular:  Negative for chest pain, palpitations and leg swelling.  Gastrointestinal:  Negative for abdominal pain, constipation and heartburn.  Musculoskeletal:  Negative for back pain, joint pain and myalgias.  Skin: Negative.   Neurological:  Negative for dizziness.  Psychiatric/Behavioral:  The patient is not nervous/anxious.     Physical Exam Constitutional:      General: She is not in acute distress.    Appearance: She is well-developed. She is not diaphoretic.  Neck:     Thyroid : No thyromegaly.  Cardiovascular:     Rate and Rhythm: Normal rate and regular rhythm.     Heart sounds: Normal heart sounds.  Pulmonary:     Effort: Pulmonary effort is normal. No respiratory distress.     Breath sounds: Normal breath sounds.  Abdominal:     General: Bowel sounds are normal. There is no distension.     Palpations: Abdomen is soft.     Tenderness: There is no abdominal tenderness.  Musculoskeletal:     Cervical back: Neck supple.     Right lower leg: No edema.     Left lower leg: No edema.     Comments:  Left hemiplegia   Lymphadenopathy:     Cervical: No cervical adenopathy.  Skin:    General: Skin is warm and dry.  Neurological:     Mental Status: She is alert. Mental status is at baseline.  Psychiatric:        Mood and Affect: Mood normal.      ASSESSMENT/ PLAN:  TODAY  Chronic pain syndrome: will continue tylenol  650 mg every 6 hours  2. Dysphagia: no indications of aspiration present; will continue thin liquids  3. Stage 3a chronic kidney disease; bun 19; creat 0.98; gfr 54   PREVIOUS   4. Gastrointestinal hemorrhage associated with diverticulosis(May 2024)/GERD without esophagitis: hgb stable; the bleeding felt to be related to diverticulosis. Will continue prilosec 20 mg twice  daily   5. Interstitial lung disease (HCC) There are no reports of respiratory distress remains on room air   6. HOCM (hypertrophic obstructive cardiomyopathy) Is currently without change in status; will monitor   7. Vascular dementia without behavioral disturbance (HCC) neurocognitive deficits/cerebral atrophy:weight is 137 pounds; will continue  namenda  xr 7 mg daily;  aricept  had been stopped due to weight loss ; is slowly being weaned off namenda    8. Neurocognitive deficits Weight is 126 pounds. Remains on namenda  14 mg daily; is off aricept    9. Cerebral atrophy  MRI 05-29-21: IMPRESSION: 1. Acute right pontine infarct. Mild associated edema without mass effect. 2. Multiple small remote bilateral cerebellar lacunar infarcts. 3. Mild for age chronic microvascular ischemic disease and moderate atrophy.  10. Somnolence: post stroke: will continue to monitor  11. Hypoalbuminemia due to protein calorie malnutrition: albumin 3.9 will continue supplements as directed  12. Vitamin B 12: deficiency: level 491 will continue 2,000 units daily  13. Vitamin D  deficiency: level 34.06; will continue 1,000 units daily   14. Mixed hyperlipidemia: is off statin due to advanced age  69. Psychosis in elderly without behavioral disturbance: is off seroquel   16. Depression recurrent: will continue zoloft  25 mg daily has failed one wean.   17. Chronic constipation: will continue senna s daily miralax  daily   18. Thoracic aortic atherosclerosis (cxr 12-15-14) not on statin due to advanced age.   19. History of CVA/left hemiplegia: is of asa due  to GI bleed.     Barnie Seip NP [facility]   call [phone]

## 2024-09-15 ENCOUNTER — Non-Acute Institutional Stay (SKILLED_NURSING_FACILITY): Admitting: Adult Health

## 2024-09-15 ENCOUNTER — Encounter: Payer: Self-pay | Admitting: Adult Health

## 2024-09-15 DIAGNOSIS — I421 Obstructive hypertrophic cardiomyopathy: Secondary | ICD-10-CM | POA: Diagnosis not present

## 2024-09-15 DIAGNOSIS — J849 Interstitial pulmonary disease, unspecified: Secondary | ICD-10-CM

## 2024-09-15 DIAGNOSIS — K219 Gastro-esophageal reflux disease without esophagitis: Secondary | ICD-10-CM | POA: Diagnosis not present

## 2024-09-15 NOTE — Progress Notes (Unsigned)
 Location:  [facility] Room Number: [id number] Place of Service:  SNF (31)   CODE STATUS: dnr   Allergies  Allergen Reactions   Ace Inhibitors     Hyperkalemia    Hydrochlorothiazide     05/29/2021 K+ 2.6 Other reaction(s): Other (See Comments) 05/29/2021 K+ 2.6   Codeine Nausea Only    Chief Complaint  Patient presents with   Medical Management of Chronic Issues                 Gastrointestinal hemorrhage associated with diverticulosis(May 2024)/GERD without esophagitis:  Interstitial lung disease:  HCOM( hypertrophic obstructive cardiomyopathy)     HPI:  She is a 89 y.o. long term resident of this facility being seen for the management of her chronic illnesses: Gastrointestinal hemorrhage associated with diverticulosis(May 2024)/GERD without esophagitis:  Interstitial lung disease:  HCOM( hypertrophic obstructive cardiomyopathy) . No reports of bleeding present. No reports of heart burn. Her respiratory status is stable.    Past Medical History:  Diagnosis Date   Decreased sense of smell    and taste-negative CT scan-2011   Depression    Essential hypertension    GERD (gastroesophageal reflux disease)    Heart murmur    heard first time 4/12   History of mammogram    2010 and she does not want to repeat them anymore   HOCM (hypertrophic obstructive cardiomyopathy) (HCC)    a. Dx by echo 05/2014.   Hypothyroidism    IBS (irritable bowel syndrome)    Impaired fasting glucose    Low back pain    Lumbar radiculopathy    with negative MRI (1991)   Postmenopausal    with osteopenia, bisphosphonates 1/05-1/11, improved osteopenia 4/12-repeat planned late 2015   Shingles    2015   Thyroid  nodule    Vitamin D  deficiency    repleted on weekly vitamin D  50000 iu    Past Surgical History:  Procedure Laterality Date   ABDOMINAL HYSTERECTOMY     BREAST BIOPSY     x2   CATARACT EXTRACTION     removal with IOL bilateral   GLAUCOMA SURGERY     laser    skin cancer removal     THYROIDECTOMY     subtotal    Social History   Socioeconomic History   Marital status: Married    Spouse name: Not on file   Number of children: Not on file   Years of education: Not on file   Highest education level: Not on file  Occupational History   Not on file  Tobacco Use   Smoking status: Never   Smokeless tobacco: Never  Vaping Use   Vaping status: Never Used  Substance and Sexual Activity   Alcohol use: No    Alcohol/week: 0.0 standard drinks of alcohol   Drug use: Never   Sexual activity: Not on file  Other Topics Concern   Not on file  Social History Narrative   Not on file   Social Drivers of Health   Financial Resource Strain: Not on file  Food Insecurity: No Food Insecurity (03/14/2023)   Hunger Vital Sign    Worried About Running Out of Food in the Last Year: Never true    Ran Out of Food in the Last Year: Never true  Transportation Needs: No Transportation Needs (03/14/2023)   PRAPARE - Administrator, Civil Service (Medical): No    Lack of Transportation (Non-Medical): No  Physical Activity: Not  on file  Stress: Not on file  Social Connections: Not on file  Intimate Partner Violence: Not At Risk (03/14/2023)   Humiliation, Afraid, Rape, and Kick questionnaire    Fear of Current or Ex-Partner: No    Emotionally Abused: No    Physically Abused: No    Sexually Abused: No   Family History  Problem Relation Age of Onset   Breast cancer Mother    Hypertension Father    CVA Father    Heart failure Sister    CAD Sister    Kidney disease Sister    COPD Brother    Lung cancer Brother    CAD Brother    Diabetes Brother    Stroke Father    Heart attack Neg Hx       VITAL SIGNS BP (!) 142/81   Pulse 77   Temp 97.7 F (36.5 C)   Resp 20   Ht 5' 2 (1.575 m)   Wt 137 lb 9.6 oz (62.4 kg)   SpO2 96%   BMI 25.17 kg/m   Outpatient Encounter Medications as of 09/15/2024  Medication Sig   acetaminophen   (TYLENOL ) 325 MG tablet Take 650 mg by mouth 3 (three) times daily.   Balsam [place]-Castor Oil (VENELEX) OINT Apply topically.  topical, Every Shift, Apply to sacrum, bilateral buttocks and bilateral ischial areas qshift for prevention.   Cholecalciferol (VITAMIN D -3) 25 MCG (1000 UT) CAPS Take 1 capsule by mouth daily.   cyanocobalamin  (VITAMIN B12) 1000 MCG tablet Take 2,000 mcg by mouth daily.   levothyroxine  (SYNTHROID ) 50 MCG tablet Take 50 mcg by mouth daily before breakfast.   memantine  (NAMENDA  XR) 7 MG CP24 24 hr capsule Take 7 mg by mouth daily.   Menthol, Topical Analgesic, (BIOFREEZE) 4 % GEL Apply 1 application  topically 3 (three) times daily as needed.   omeprazole (PRILOSEC) 20 MG capsule Take 20 mg by mouth 2 (two) times daily.   oxymetazoline (AFRIN) 0.05 % nasal spray Place 1 spray into both nostrils daily as needed for congestion.   Phenylephrine  HCl (PREPARATION H RE) Place 1 suppository rectally at bedtime.  Insert one rectally nightly for hemorrhoids As Needed PRN 1, PRN 2, PRN 3, PRN 4, PRN 5, PRN 6   polyethylene glycol (MIRALAX  / GLYCOLAX ) 17 g packet Take 17 g by mouth daily.   sennosides-docusate sodium  (SENOKOT-S) 8.6-50 MG tablet Take 1 tablet by mouth daily.   sertraline  (ZOLOFT ) 25 MG tablet Take 25 mg by mouth daily.   sodium chloride  (OCEAN) 0.65 % SOLN nasal spray Place 1 spray into both nostrils daily. And as needed   No facility-administered encounter medications on file as of 09/15/2024.     SIGNIFICANT DIAGNOSTIC EXAMS  LABS REVIEWED PREVIOUS      10-18-23: glucose 107; bun 30; creat 1.04; k+ 4.3; na++ 141; ca 9.6; gfr 51  12-30-23: glucose 141; bun 29; creat 0.90; k+ 5.2; na++ 139; ca 9.7; gfr >60  01-06-24: glucose 100; bun 25; creat 0.96; k+ 3.8; na++ 139; ca 9.0; gfr 56 01-30-24: wbc 9.7; hgb 14.2; hct 43.4; mcv 92.1 plt 221; glucose 100; bun 24; creat 0.86; k+ 3.9; na++ 137' ca 9.1; gfr >60; total protein 6.5; albumin 3.4   TODAY  07-21-24: wbc  10.9; hgb 13.2; hct 40.9; mcv 93.2 plt 217; glucose 124; bun 19;c reat 0.98; k+ 3.8; na++ 140; ca 9.4 gfr 54; protein 6.7 albumin 3.9    Review of Systems  Constitutional:  Negative for malaise/fatigue.  Respiratory:  Negative for cough and shortness of breath.   Cardiovascular:  Negative for chest pain, palpitations and leg swelling.  Gastrointestinal:  Negative for abdominal pain, constipation and heartburn.  Musculoskeletal:  Negative for back pain, joint pain and myalgias.  Skin: Negative.   Neurological:  Negative for dizziness.  Psychiatric/Behavioral:  The patient is not nervous/anxious.     Physical Exam Constitutional:      General: She is not in acute distress.    Appearance: She is well-developed. She is not diaphoretic.  Neck:     Thyroid : No thyromegaly.  Cardiovascular:     Rate and Rhythm: Normal rate and regular rhythm.     Heart sounds: Normal heart sounds.  Pulmonary:     Effort: Pulmonary effort is normal. No respiratory distress.     Breath sounds: Normal breath sounds.  Abdominal:     General: Bowel sounds are normal. There is no distension.     Palpations: Abdomen is soft.     Tenderness: There is no abdominal tenderness.  Musculoskeletal:     Cervical back: Neck supple.     Right lower leg: No edema.     Left lower leg: No edema.     Comments: Left hemiplegia   Lymphadenopathy:     Cervical: No cervical adenopathy.  Skin:    General: Skin is warm and dry.  Neurological:     Mental Status: She is alert. Mental status is at baseline.  Psychiatric:        Mood and Affect: Mood normal.      ASSESSMENT/ PLAN:  TODAY   Gastrointestinal hemorrhage associated with diverticulosis(May 2024)/GERD without esophagitis: her bleeding was felt to related to diverticulitis; will continue prilosec 20 mg daily; and hgb remains stable.   2. Interstitial lung disease: no reports of respiratory distress; she is on room air.   3. HCOM( hypertrophic obstructive  cardiomyopathy) without change will monitor   PREVIOUS   4. Vascular dementia without behavioral disturbance / neurocognitive deficits/cerebral atrophy:weight is 137 pounds; will continue  namenda  xr 7 mg daily;  aricept  had been stopped due to weight loss ; is slowly being weaned off namenda    5. Neurocognitive deficits Weight is 137 pounds. Remains on namenda  xr 7  mg daily; is off aricept    6. Cerebral atrophy  MRI 05-29-21: IMPRESSION: 1. Acute right pontine infarct. Mild associated edema without mass effect. 2. Multiple small remote bilateral cerebellar lacunar infarcts. 3. Mild for age chronic microvascular ischemic disease and moderate atrophy.  7. Somnolence: post stroke: will continue to monitor  8. Hypoalbuminemia due to protein calorie malnutrition: albumin 3.9 will continue supplements as directed  9. Vitamin B 12: deficiency: level 491 will continue 2,000 units daily  10. Vitamin D  deficiency: level 34.06; will continue 1,000 units daily   11. Mixed hyperlipidemia: is off statin due to advanced age  48. Psychosis in elderly without behavioral disturbance: is off seroquel   13. Depression recurrent: will continue zoloft  25 mg daily has failed one wean.   14. Chronic constipation: will continue senna s daily miralax  daily   15. Thoracic aortic atherosclerosis (cxr 12-15-14) not on statin due to advanced age.   16. History of CVA/left hemiplegia: is off asa due to GI bleed.   17. Chronic pain syndrome: will continue tylenol  650 mg every 6 hours  18. Dysphagia: no indications of aspiration present; will continue thin liquids  19. Stage 3a chronic kidney disease; bun 19; creat 0.98; gfr 54  Barnie Seip NP [facility]  call [phone]

## 2024-09-16 ENCOUNTER — Encounter: Payer: Self-pay | Admitting: Adult Health

## 2024-09-16 ENCOUNTER — Non-Acute Institutional Stay (SKILLED_NURSING_FACILITY): Admitting: Adult Health

## 2024-09-16 DIAGNOSIS — I700 Atherosclerosis of aorta: Secondary | ICD-10-CM | POA: Diagnosis not present

## 2024-09-16 DIAGNOSIS — K5909 Other constipation: Secondary | ICD-10-CM

## 2024-09-16 DIAGNOSIS — I10 Essential (primary) hypertension: Secondary | ICD-10-CM | POA: Diagnosis not present

## 2024-09-16 NOTE — Progress Notes (Signed)
 Location:  [facility] Room Number: HA/138/P Place of Service:  SNF (31)   CODE STATUS: DNR  Allergies  Allergen Reactions   Ace Inhibitors     Hyperkalemia    Hydrochlorothiazide     05/29/2021 K+ 2.6 Other reaction(s): Other (See Comments) 05/29/2021 K+ 2.6   Codeine Nausea Only    Chief Complaint  Patient presents with   Care Plan Meeting    HPI:  We have come together for her care plan meeting. BIMS 7/15 mood 0/30. Uses wheelchair with 2 falls without injury. She requires moderate to dependent assist with her adl care. Dietary: setup for meals regular diet appetite 26-100% weight is 137.6 pounds. Therapy: none at this times. Activities: occasionally participates. She will continue to be followed for her chronic illnesses including: Essential hypertension   Chronic constipation Thoracic aortic atherosclerosis  Past Medical History:  Diagnosis Date   Decreased sense of smell    and taste-negative CT scan-2011   Depression    Essential hypertension    GERD (gastroesophageal reflux disease)    Heart murmur    heard first time 4/12   History of mammogram    2010 and she does not want to repeat them anymore   HOCM (hypertrophic obstructive cardiomyopathy) (HCC)    a. Dx by echo 05/2014.   Hypothyroidism    IBS (irritable bowel syndrome)    Impaired fasting glucose    Low back pain    Lumbar radiculopathy    with negative MRI (1991)   Postmenopausal    with osteopenia, bisphosphonates 1/05-1/11, improved osteopenia 4/12-repeat planned late 2015   Shingles    2015   Thyroid  nodule    Vitamin D  deficiency    repleted on weekly vitamin D  50000 iu    Past Surgical History:  Procedure Laterality Date   ABDOMINAL HYSTERECTOMY     BREAST BIOPSY     x2   CATARACT EXTRACTION     removal with IOL bilateral   GLAUCOMA SURGERY     laser   skin cancer removal     THYROIDECTOMY     subtotal    Social History   Socioeconomic History   Marital  status: Married    Spouse name: Not on file   Number of children: Not on file   Years of education: Not on file   Highest education level: Not on file  Occupational History   Not on file  Tobacco Use   Smoking status: Never   Smokeless tobacco: Never  Vaping Use   Vaping status: Never Used  Substance and Sexual Activity   Alcohol use: No    Alcohol/week: 0.0 standard drinks of alcohol   Drug use: Never   Sexual activity: Not on file  Other Topics Concern   Not on file  Social History Narrative   Not on file   Social Drivers of Health   Financial Resource Strain: Not on file  Food Insecurity: No Food Insecurity (03/14/2023)   Hunger Vital Sign    Worried About Running Out of Food in the Last Year: Never true    Ran Out of Food in the Last Year: Never true  Transportation Needs: No Transportation Needs (03/14/2023)   PRAPARE - Administrator, Civil Service (Medical): No    Lack of Transportation (Non-Medical): No  Physical Activity: Not on file  Stress: Not on file  Social Connections: Not on file  Intimate Partner Violence: Not At Risk (03/14/2023)  Humiliation, Afraid, Rape, and Kick questionnaire    Fear of Current or Ex-Partner: No    Emotionally Abused: No    Physically Abused: No    Sexually Abused: No   Family History  Problem Relation Age of Onset   Breast cancer Mother    Hypertension Father    CVA Father    Heart failure Sister    CAD Sister    Kidney disease Sister    COPD Brother    Lung cancer Brother    CAD Brother    Diabetes Brother    Stroke Father    Heart attack Neg Hx       VITAL SIGNS BP (!) 142/81   Pulse 77   Temp 97.7 F (36.5 C)   Resp 20   Ht 4' 6 (1.372 m)   Wt 137 lb 9.6 oz (62.4 kg)   SpO2 96%   BMI 33.18 kg/m   Outpatient Encounter Medications as of 09/16/2024  Medication Sig   acetaminophen  (TYLENOL ) 325 MG tablet Take 650 mg by mouth 3 (three) times daily.   ammonium lactate (LAC-HYDRIN) 12 % lotion  Apply 1 Application topically every other day. Special Instructions: Apply to bilateral feet and around toenails QOD. Once A Day Every Other Day; 07:15 AM - 03:15 PM   Balsam Peru-Castor Oil (VENELEX) OINT Apply topically.  topical, Every Shift, Apply to sacrum, bilateral buttocks and bilateral ischial areas qshift for prevention.   cyanocobalamin  (VITAMIN B12) 1000 MCG tablet Take 2,000 mcg by mouth daily. (Patient taking differently: Take 1,000 mcg by mouth daily.)   ergocalciferol  (VITAMIN D2) 1.25 MG (50000 UT) capsule Take 50,000 Units by mouth once a week. On Tuesday   levothyroxine  (SYNTHROID ) 50 MCG tablet Take 50 mcg by mouth daily before breakfast.   memantine  (NAMENDA  XR) 7 MG CP24 24 hr capsule Take 7 mg by mouth daily.   Menthol, Topical Analgesic, (BIOFREEZE) 4 % GEL Apply 1 application  topically 3 (three) times daily as needed.   omeprazole (PRILOSEC) 20 MG capsule Take 20 mg by mouth 2 (two) times daily.   oxymetazoline (AFRIN) 0.05 % nasal spray Place 1 spray into both nostrils daily as needed for congestion.   Phenylephrine  HCl (PREPARATION H RE) Place 1 suppository rectally at bedtime.  Insert one rectally nightly for hemorrhoids As Needed PRN 1, PRN 2, PRN 3, PRN 4, PRN 5, PRN 6   polyethylene glycol (MIRALAX  / GLYCOLAX ) 17 g packet Take 17 g by mouth daily.   sennosides-docusate sodium  (SENOKOT-S) 8.6-50 MG tablet Take 1 tablet by mouth daily.   sertraline  (ZOLOFT ) 25 MG tablet Take 25 mg by mouth daily.   sodium chloride  (OCEAN) 0.65 % SOLN nasal spray Place 1 spray into both nostrils daily. And as needed   Cholecalciferol (VITAMIN D -3) 25 MCG (1000 UT) CAPS Take 1 capsule by mouth daily. (Patient not taking: Reported on 09/16/2024)   No facility-administered encounter medications on file as of 09/16/2024.     SIGNIFICANT DIAGNOSTIC EXAMS  LABS REVIEWED PREVIOUS      10-18-23: glucose 107; bun 30; creat 1.04; k+ 4.3; na++ 141; ca 9.6; gfr 51  12-30-23: glucose 141;  bun 29; creat 0.90; k+ 5.2; na++ 139; ca 9.7; gfr >60  01-06-24: glucose 100; bun 25; creat 0.96; k+ 3.8; na++ 139; ca 9.0; gfr 56 01-30-24: wbc 9.7; hgb 14.2; hct 43.4; mcv 92.1 plt 221; glucose 100; bun 24; creat 0.86; k+ 3.9; na++ 137' ca 9.1; gfr >60; total protein 6.5;  albumin 3.4   TODAY  07-21-24: wbc 10.9; hgb 13.2; hct 40.9; mcv 93.2 plt 217; glucose 124; bun 19;c reat 0.98; k+ 3.8; na++ 140; ca 9.4 gfr 54; protein 6.7 albumin 3.9     Review of Systems  Constitutional:  Negative for malaise/fatigue.  Respiratory:  Negative for cough and shortness of breath.   Cardiovascular:  Negative for chest pain, palpitations and leg swelling.  Gastrointestinal:  Negative for abdominal pain, constipation and heartburn.  Musculoskeletal:  Negative for back pain, joint pain and myalgias.  Skin: Negative.   Neurological:  Negative for dizziness.  Psychiatric/Behavioral:  The patient is not nervous/anxious.     Physical Exam Constitutional:      General: She is not in acute distress.    Appearance: She is well-developed. She is not diaphoretic.  Neck:     Thyroid : No thyromegaly.  Cardiovascular:     Rate and Rhythm: Normal rate and regular rhythm.     Heart sounds: Normal heart sounds.  Pulmonary:     Effort: Pulmonary effort is normal. No respiratory distress.     Breath sounds: Normal breath sounds.  Abdominal:     General: Bowel sounds are normal. There is no distension.     Palpations: Abdomen is soft.     Tenderness: There is no abdominal tenderness.  Musculoskeletal:     Cervical back: Neck supple.     Right lower leg: No edema.     Left lower leg: No edema.     Comments: Left hemiplegia  Lymphadenopathy:     Cervical: No cervical adenopathy.  Skin:    General: Skin is warm and dry.  Neurological:     Mental Status: She is alert. Mental status is at baseline.  Psychiatric:        Mood and Affect: Mood normal.      ASSESSMENT/ PLAN:  TODAY  Essential  hypertension Chronic constipation Thoracic aortic atherosclerosis  Will continue current medications Will continue current plan of care Will continue to monitor her status  Time spent with patient: 40 minutes: dietary; medications; plan of care.    Barnie Seip NP [facility]  call [phone]

## 2024-09-16 NOTE — Progress Notes (Signed)
ERROR    This encounter was created in error - please disregard.

## 2024-09-21 ENCOUNTER — Non-Acute Institutional Stay (SKILLED_NURSING_FACILITY): Admitting: Adult Health

## 2024-09-21 ENCOUNTER — Encounter: Payer: Self-pay | Admitting: Adult Health

## 2024-09-21 DIAGNOSIS — Z0000 Encounter for general adult medical examination without abnormal findings: Secondary | ICD-10-CM | POA: Diagnosis not present

## 2024-09-21 NOTE — Progress Notes (Signed)
 Chief Complaint  Patient presents with   Medicare Wellness    Annual wellness visit     Subjective:   SHENISE WOLGAMOTT is a 89 y.o. female who presents for a Medicare Annual Wellness Visit.  Fall Screening Falls in the past year?: 1 Number of falls in past year: 0 Was there an injury with Fall?: 0 Fall Risk Category Calculator: 1 Patient Fall Risk Level: High fall risk  Fall Risk Patient at Risk for Falls Due to: History of fall(s); Impaired balance/gait; Impaired mobility  Advance Directives (For Healthcare) Does Patient Have a Medical Advance Directive?: Yes Does patient want to make changes to medical advance directive?: No - Patient declined Type of Advance Directive: Out of facility DNR (pink MOST or yellow form) Out of facility DNR (pink MOST or yellow form) in Chart? (Ambulatory ONLY): Yes - validated most recent copy scanned in chart Pre-existing out of facility DNR order (yellow form or pink MOST form): Pink MOST form placed in chart (order not valid for inpatient use)    Allergies (verified) Ace inhibitors, Hydrochlorothiazide, and Codeine   Current Medications (verified) Outpatient Encounter Medications as of 09/21/2024  Medication Sig   acetaminophen  (TYLENOL ) 325 MG tablet Take 650 mg by mouth 3 (three) times daily.   ammonium lactate (LAC-HYDRIN) 12 % lotion Apply 1 Application topically every other day. Special Instructions: Apply to bilateral feet and around toenails QOD. Once A Day Every Other Day; 07:15 AM - 03:15 PM   Balsam [place]-Castor Oil (VENELEX) OINT Apply topically.  topical, Every Shift, Apply to sacrum, bilateral buttocks and bilateral ischial areas qshift for prevention.   cyanocobalamin  (VITAMIN B12) 1000 MCG tablet Take 1,000 mcg by mouth daily.   ergocalciferol  (VITAMIN D2) 1.25 MG (50000 UT) capsule Take 50,000 Units by mouth once a week. On Tuesday   levothyroxine  (SYNTHROID ) 50 MCG tablet Take 50 mcg by mouth daily before breakfast.    memantine  (NAMENDA  XR) 7 MG CP24 24 hr capsule Take 7 mg by mouth daily.   Menthol, Topical Analgesic, (BIOFREEZE) 4 % GEL Apply 1 application  topically 3 (three) times daily as needed.   NON FORMULARY Diet: Regular, thin liquids- Add prune juice to Breakfast tray   Nutritional Supplement LIQD Take 237 mLs by mouth daily.   omeprazole (PRILOSEC) 20 MG capsule Take 20 mg by mouth 2 (two) times daily.   oxymetazoline (AFRIN) 0.05 % nasal spray Place 1 spray into both nostrils daily as needed for congestion.   Phenylephrine  HCl (PREPARATION H RE) Place 1 suppository rectally at bedtime as needed. PRN 1, PRN 2, PRN 3, PRN 4, PRN 5, PRN 6   polyethylene glycol (MIRALAX  / GLYCOLAX ) 17 g packet Take 17 g by mouth daily.   sennosides-docusate sodium  (SENOKOT-S) 8.6-50 MG tablet Take 1 tablet by mouth daily.   sertraline  (ZOLOFT ) 25 MG tablet Take 25 mg by mouth daily.   sodium chloride  (OCEAN) 0.65 % SOLN nasal spray Place 1 spray into both nostrils daily. And as needed   Cholecalciferol (VITAMIN D -3) 25 MCG (1000 UT) CAPS Take 1 capsule by mouth daily. (Patient not taking: Reported on 09/21/2024)   No facility-administered encounter medications on file as of 09/21/2024.    History: Past Medical History:  Diagnosis Date   Decreased sense of smell    and taste-negative CT scan-2011   Depression    Essential hypertension    GERD (gastroesophageal reflux disease)    Heart murmur    heard first time 4/12  History of mammogram    2010 and she does not want to repeat them anymore   HOCM (hypertrophic obstructive cardiomyopathy) (HCC)    a. Dx by echo 05/2014.   Hypothyroidism    IBS (irritable bowel syndrome)    Impaired fasting glucose    Low back pain    Lumbar radiculopathy    with negative MRI (1991)   Postmenopausal    with osteopenia, bisphosphonates 1/05-1/11, improved osteopenia 4/12-repeat planned late 2015   Shingles    2015   Thyroid  nodule    Vitamin D  deficiency    repleted on  weekly vitamin D  50000 iu   Past Surgical History:  Procedure Laterality Date   ABDOMINAL HYSTERECTOMY     BREAST BIOPSY     x2   CATARACT EXTRACTION     removal with IOL bilateral   GLAUCOMA SURGERY     laser   skin cancer removal     THYROIDECTOMY     subtotal   Family History  Problem Relation Age of Onset   Breast cancer Mother    Hypertension Father    CVA Father    Heart failure Sister    CAD Sister    Kidney disease Sister    COPD Brother    Lung cancer Brother    CAD Brother    Diabetes Brother    Stroke Father    Heart attack Neg Hx    Social History   Occupational History   Not on file  Tobacco Use   Smoking status: Never   Smokeless tobacco: Never  Vaping Use   Vaping status: Never Used  Substance and Sexual Activity   Alcohol use: No    Alcohol/week: 0.0 standard drinks of alcohol   Drug use: Never   Sexual activity: Not on file   Tobacco Counseling Counseling given: Not Answered  SDOH Screenings   Food Insecurity: No Food Insecurity (03/14/2023)  Housing: Low Risk  (03/14/2023)  Transportation Needs: No Transportation Needs (03/14/2023)  Utilities: Not At Risk (03/14/2023)  Depression (PHQ2-9): Low Risk  (09/03/2023)  Tobacco Use: Low Risk  (09/21/2024)   See flowsheets for full screening details  Depression Screen Depression Screening Exception Documentation Depression Screening Exception:: Other- indicate reason in comment box Depression Screening Exception Comment:: unable to fully participate     Goals Addressed   None          Objective:    Today's Vitals   09/21/24 1021 09/21/24 1025  BP: (!) 141/79 (!) 142/81  Pulse: 72   Resp: 20   Temp: 97.8 F (36.6 C)   SpO2: 97%   Weight: 137 lb 9.6 oz (62.4 kg)   Height: 4' 6 (1.372 m)    Body mass index is 33.18 kg/m.  Hearing/Vision screen No results found. Immunizations and Health Maintenance Health Maintenance  Topic Date Due   Medicare Annual Wellness (AWV)   09/02/2024   COVID-19 Vaccine (9 - 2025-26 season) 09/30/2024   DTaP/Tdap/Td (3 - Td or Tdap) 07/21/2034   Pneumococcal Vaccine: 50+ Years  Completed   Influenza Vaccine  Completed   Bone Density Scan  Completed   Zoster Vaccines- Shingrix  Completed   Meningococcal B Vaccine  Aged Out        Assessment/Plan:  This is a routine wellness examination for Vickie Mckee.  Patient Care Team: Landy Barnie RAMAN, NP as PCP - General (Geriatric Medicine)  I have personally reviewed and noted the following in the patient's chart:   Medical  and social history Use of alcohol, tobacco or illicit drugs  Current medications and supplements including opioid prescriptions. Functional ability and status Nutritional status Physical activity Advanced directives List of other physicians Hospitalizations, surgeries, and ER visits in previous 12 months Vitals Screenings to include cognitive, depression, and falls Referrals and appointments  No orders of the defined types were placed in this encounter.  In addition, I have reviewed and discussed with patient certain preventive protocols, quality metrics, and best practice recommendations. A written personalized care plan for preventive services as well as general preventive health recommendations were provided to patient.   Barnie GORMAN Seip, NP   09/21/2024   No follow-ups on file.  After Visit Summary:   Nurse Notes: this exam performed by myself at this facility

## 2024-09-21 NOTE — Patient Instructions (Signed)
 Vickie Mckee,  Thank you for taking the time for your Medicare Wellness Visit. I appreciate your continued commitment to your health goals. Please review the care plan we discussed, and feel free to reach out if I can assist you further.  Please note that Annual Wellness Visits do not include a physical exam. Some assessments may be limited, especially if the visit was conducted virtually. If needed, we may recommend an in-person follow-up with your provider.  Ongoing Care Seeing your primary care provider every 3 to 6 months helps us  monitor your health and provide consistent, personalized care.   Referrals If a referral was made during today's visit and you haven't received any updates within two weeks, please contact the referred provider directly to check on the status.  Recommended Screenings:  Health Maintenance  Topic Date Due   Medicare Annual Wellness Visit  09/02/2024   COVID-19 Vaccine (9 - 2025-26 season) 09/30/2024   DTaP/Tdap/Td vaccine (3 - Td or Tdap) 07/21/2034   Pneumococcal Vaccine for age over 66  Completed   Flu Shot  Completed   Osteoporosis screening with Bone Density Scan  Completed   Zoster (Shingles) Vaccine  Completed   Meningitis B Vaccine  Aged Out       09/21/2024    1:30 PM  Advanced Directives  Does Patient Have a Medical Advance Directive? Yes  Type of Advance Directive Out of facility DNR (pink MOST or yellow form)  Does patient want to make changes to medical advance directive? No - Guardian declined  Pre-existing out of facility DNR order (yellow form or pink MOST form) Pink MOST/Yellow Form most recent copy in chart - Physician notified to receive inpatient order    Vision: Annual vision screenings are recommended for early detection of glaucoma, cataracts, and diabetic retinopathy. These exams can also reveal signs of chronic conditions such as diabetes and high blood pressure.  Dental: Annual dental screenings help detect early signs of oral  cancer, gum disease, and other conditions linked to overall health, including heart disease and diabetes.  Please see the attached documents for additional preventive care recommendations.

## 2024-10-09 ENCOUNTER — Encounter: Payer: Self-pay | Admitting: Internal Medicine

## 2024-10-09 ENCOUNTER — Non-Acute Institutional Stay (SKILLED_NURSING_FACILITY): Admitting: Internal Medicine

## 2024-10-09 DIAGNOSIS — R4189 Other symptoms and signs involving cognitive functions and awareness: Secondary | ICD-10-CM

## 2024-10-09 DIAGNOSIS — G8194 Hemiplegia, unspecified affecting left nondominant side: Secondary | ICD-10-CM | POA: Diagnosis not present

## 2024-10-09 DIAGNOSIS — J849 Interstitial pulmonary disease, unspecified: Secondary | ICD-10-CM | POA: Diagnosis not present

## 2024-10-09 DIAGNOSIS — R29818 Other symptoms and signs involving the nervous system: Secondary | ICD-10-CM | POA: Diagnosis not present

## 2024-10-09 DIAGNOSIS — E441 Mild protein-calorie malnutrition: Secondary | ICD-10-CM | POA: Diagnosis not present

## 2024-10-09 DIAGNOSIS — E559 Vitamin D deficiency, unspecified: Secondary | ICD-10-CM | POA: Diagnosis not present

## 2024-10-09 NOTE — Patient Instructions (Signed)
 See assessment and plan under each diagnosis in the problem list and acutely for this visit

## 2024-10-09 NOTE — Progress Notes (Unsigned)
 "   NURSING HOME LOCATION:  [facility] ROOM NUMBER:  [id number]  CODE STATUS:  DNR  PCP: Landy Barnie RAMAN, NP   This is a nursing facility follow up visit of chronic medical diagnoses to document compliance with Regulation 483.30 (c) in The Long Term Care Survey Manual Phase 2 which mandates caregiver visit ( visits can alternate among physician, PA or NP as per statutes) within 10 days of 30 days / 60 days/ 90 days post admission to SNF date  .  Interim medical record and care since last SNF visit was updated with review of diagnostic studies and change in clinical status since last visit were documented.  HPI: She is a permanent resident of this facility with medical diagnoses of essential hypertension; GERD; hypertrophic obstructive cardiomyopathy; hypothyroidism; IBS; lumbar radiculopathy; and vitamin D  deficiency. Significant surgeries include thyroidectomy for thyroid  nodule. Vitamin D  level was slightly low at 25.30;  vitamin B12 was supranormal at 1273; and TSH therapeutic at 3.920 on 10/27.  She has exhibited minor hyperglycemia; A1c is not current.  Most recent glucose was 124 on 9/30.  On that same day CKD stage IIIa was documented with a GFR of 54 and creatinine of 0.98.  Total protein and albumin have normalized.  CBC was normal.  Review of systems: Dementia invalidated responses.  Initially she stated that she was not feeling very good.  When I asked her the symptoms, her response was I do not know.  She went on to say I got weak.  She could not tell me why the left upper extremity was not a sling.  She does validate numbness in the left upper extremity.  Her daughter states that she has some skin breakdown in the posterior thigh area related to pamper being wet at times. Wound Care Nurse is monitoring.  Despite persistent rales in the right lower lobe greater than the left; she denies any respiratory symptoms.  Constitutional: No fever, significant weight change,  fatigue  Eyes: No redness, discharge, pain, vision change ENT/mouth: No nasal congestion,  purulent discharge, earache, change in hearing, sore throat  Cardiovascular: No chest pain, palpitations, paroxysmal nocturnal dyspnea, edema  Respiratory: No cough, sputum production, hemoptysis, DOE, significant snoring, apnea   Gastrointestinal: No heartburn, dysphagia, abdominal pain, nausea /vomiting, rectal bleeding, melena, change in bowels Genitourinary: No dysuria, hematuria, pyuria, incontinence, nocturia Musculoskeletal: No joint stiffness, joint swelling, pain Dermatologic: No rash, pruritus Neurologic: No dizziness, headache, syncope, seizures Psychiatric: No significant anxiety, depression, insomnia, anorexia Endocrine: No change in hair/skin/nails, excessive thirst, excessive hunger, excessive urination  Hematologic/lymphatic: No significant bruising, lymphadenopathy, abnormal bleeding Allergy/immunology: No itchy/watery eyes, significant sneezing, urticaria, angioedema  Physical exam:  Pertinent or positive findings: She is pleasant and interactive but profoundly neurocognitively challenged as noted.  Op scar is present at the base of the neck anteriorly.  As noted she has rales greater in the right lower lobe than the left.  Abdomen slightly protuberant.  She has 1/2+ edema at the left lower extremity and trace of the right lower extremity at the sock line.  Dorsalis pedis pulses are stronger than posterior tibial pulses.  Limb atrophy and interosseous wasting are present. LUE is in a sling.  General appearance:  no acute distress, increased work of breathing is present.   Lymphatic: No lymphadenopathy about the head, neck, axilla. Eyes: No conjunctival inflammation or lid edema is present. There is no scleral icterus. Ears:  External ear exam shows no significant lesions or deformities.  Nose:  External nasal examination shows no deformity or inflammation. Nasal mucosa are pink and  moist without lesions, exudates Oral exam:  Lips and gums are healthy appearing. There is no oropharyngeal erythema or exudate. Neck:  No thyromegaly, masses, tenderness noted.    Heart:  Normal rate and regular rhythm. S1 and S2 normal without gallop, murmur, click, rub .  Lungs:  without wheezes, rhonchi, rubs. Abdomen: Bowel sounds are normal. Abdomen is soft and nontender with no organomegaly, hernias, masses. GU: Deferred  Extremities:  No cyanosis, clubbing  Neurologic exam :Balance, Rhomberg, finger to nose testing could not be completed due to clinical state Skin: Warm & dry w/o tenting. No significant visable rash.  See summary under each active problem in the Problem List with associated updated therapeutic plan :  Interstitial lung disease (HCC) Persistent rales right lower lobe greater than left.  She is asymptomatic & room air O2 sats are excellent @ 96%.  These findings discussed with her daughter.  Left hemiplegia (HCC) Left upper extremity is sling bound.  There has been no progression of neuromuscular deficits.  Neurocognitive deficits Dementia is severe; there are minimal behavioral issues reported by staff.  No change indicated but continue to monitor.  Vitamin D  deficiency Current vitamin D  level is slightly low; she is now on 50,000 units of vitamin D2 weekly.  Recheck vitamin D  level in 8-12 weeks.  Unspecified protein-calorie malnutrition Albumin is now normal at 3.9 and total protein normal at 6.7.  Despite this she does exhibit limb atrophy and interosseous wasting.  Nutritionist continues to monitor at [facility].   "

## 2024-10-10 NOTE — Assessment & Plan Note (Signed)
 Persistent rales right lower lobe greater than left.  She is asymptomatic in room air O2 sats are excellent 96%.  These findings discussed with her daughter.

## 2024-10-10 NOTE — Assessment & Plan Note (Signed)
 Albumin is now normal at 3.9 and total protein normal at 6.7.  Despite this she does exhibit limb atrophy and interosseous wasting.  Nutritionist continues to monitor at [facility].

## 2024-10-10 NOTE — Assessment & Plan Note (Addendum)
 Current vitamin D  level is slightly low; she is now on 50,000 units of vitamin D2 weekly.  Recheck vitamin D  level in 8-12 weeks.

## 2024-10-10 NOTE — Assessment & Plan Note (Signed)
 Left upper extremity is sling bound.  There is been no progression of neuromuscular deficits.

## 2024-10-10 NOTE — Assessment & Plan Note (Signed)
 Dementia is severe; there are minimal behavioral issues reported by staff.  No change indicated but continue to monitor.

## 2024-10-16 ENCOUNTER — Other Ambulatory Visit (HOSPITAL_COMMUNITY)
Admission: RE | Admit: 2024-10-16 | Discharge: 2024-10-16 | Disposition: A | Discharge status: Home / Self Care | Source: Skilled Nursing Facility | Attending: Adult Health | Admitting: Adult Health

## 2024-10-16 DIAGNOSIS — M6281 Muscle weakness (generalized): Secondary | ICD-10-CM | POA: Diagnosis present

## 2024-10-16 LAB — RESP PANEL BY RT-PCR (RSV, FLU A&B, COVID)  RVPGX2
Influenza A by PCR: NEGATIVE
Influenza B by PCR: NEGATIVE
Resp Syncytial Virus by PCR: NEGATIVE
SARS Coronavirus 2 by RT PCR: NEGATIVE

## 2024-10-21 ENCOUNTER — Other Ambulatory Visit (HOSPITAL_COMMUNITY)
Admission: RE | Admit: 2024-10-21 | Discharge: 2024-10-21 | Disposition: A | Discharge status: Home / Self Care | Source: Skilled Nursing Facility | Attending: Adult Health | Admitting: Adult Health

## 2024-10-21 DIAGNOSIS — N1831 Chronic kidney disease, stage 3a: Secondary | ICD-10-CM | POA: Insufficient documentation

## 2024-10-21 LAB — BASIC METABOLIC PANEL WITH GFR
Anion gap: 14 (ref 5–15)
BUN: 20 mg/dL (ref 8–23)
CO2: 25 mmol/L (ref 22–32)
Calcium: 9.4 mg/dL (ref 8.9–10.3)
Chloride: 104 mmol/L (ref 98–111)
Creatinine, Ser: 1.01 mg/dL — ABNORMAL HIGH (ref 0.44–1.00)
GFR, Estimated: 52 mL/min — ABNORMAL LOW
Glucose, Bld: 116 mg/dL — ABNORMAL HIGH (ref 70–99)
Potassium: 4.4 mmol/L (ref 3.5–5.1)
Sodium: 143 mmol/L (ref 135–145)

## 2024-11-11 ENCOUNTER — Encounter: Payer: Self-pay | Admitting: Adult Health

## 2024-11-11 ENCOUNTER — Non-Acute Institutional Stay (SKILLED_NURSING_FACILITY): Payer: Self-pay | Admitting: Adult Health

## 2024-11-11 DIAGNOSIS — R4189 Other symptoms and signs involving cognitive functions and awareness: Secondary | ICD-10-CM | POA: Diagnosis not present

## 2024-11-11 DIAGNOSIS — R29818 Other symptoms and signs involving the nervous system: Secondary | ICD-10-CM | POA: Diagnosis not present

## 2024-11-11 DIAGNOSIS — R400 Somnolence: Secondary | ICD-10-CM | POA: Diagnosis not present

## 2024-11-11 DIAGNOSIS — F0150 Vascular dementia without behavioral disturbance: Secondary | ICD-10-CM

## 2024-11-11 DIAGNOSIS — G319 Degenerative disease of nervous system, unspecified: Secondary | ICD-10-CM

## 2024-11-11 NOTE — Progress Notes (Signed)
 " Location:  [facility] Room Number: 138P Place of Service:  SNF (31)   CODE STATUS: DNR  Allergies[1]  Chief Complaint  Patient presents with   Medical Management of Chronic Issues           Vascular dementia without behavioral disturbance/neurocognitive deficits: Cerebral atrophy:  Somnolence:    HPI:  She is a 89 y.o. long term resident of this facility being seen for the management of her chronic illnesses:Vascular dementia without behavioral disturbance/neurocognitive deficits: Cerebral atrophy:  Somnolence. Weight remains stable at 139 pounds; there are no reports of uncontrolled pain.    Past Medical History:  Diagnosis Date   Decreased sense of smell    and taste-negative CT scan-2011   Depression    Essential hypertension    GERD (gastroesophageal reflux disease)    Heart murmur    heard first time 4/12   History of mammogram    2010 and she does not want to repeat them anymore   HOCM (hypertrophic obstructive cardiomyopathy) (HCC)    a. Dx by echo 05/2014.   Hypothyroidism    IBS (irritable bowel syndrome)    Impaired fasting glucose    Low back pain    Lumbar radiculopathy    with negative MRI (1991)   Postmenopausal    with osteopenia, bisphosphonates 1/05-1/11, improved osteopenia 4/12-repeat planned late 2015   Shingles    2015   Thyroid  nodule    Vitamin D  deficiency    repleted on weekly vitamin D  50000 iu    Past Surgical History:  Procedure Laterality Date   ABDOMINAL HYSTERECTOMY     BREAST BIOPSY     x2   CATARACT EXTRACTION     removal with IOL bilateral   GLAUCOMA SURGERY     laser   skin cancer removal     THYROIDECTOMY     subtotal    Social History   Socioeconomic History   Marital status: Married    Spouse name: Not on file   Number of children: Not on file   Years of education: Not on file   Highest education level: Not on file  Occupational History   Not on file  Tobacco Use   Smoking status:  Never   Smokeless tobacco: Never  Vaping Use   Vaping status: Never Used  Substance and Sexual Activity   Alcohol use: No    Alcohol/week: 0.0 standard drinks of alcohol   Drug use: Never   Sexual activity: Not on file  Other Topics Concern   Not on file  Social History Narrative   Not on file   Social Drivers of Health   Tobacco Use: Low Risk (11/11/2024)   Patient History    Smoking Tobacco Use: Never    Smokeless Tobacco Use: Never    Passive Exposure: Not on file  Financial Resource Strain: Not on file  Food Insecurity: No Food Insecurity (03/14/2023)   Hunger Vital Sign    Worried About Running Out of Food in the Last Year: Never true    Ran Out of Food in the Last Year: Never true  Transportation Needs: No Transportation Needs (03/14/2023)   PRAPARE - Administrator, Civil Service (Medical): No    Lack of Transportation (Non-Medical): No  Physical Activity: Not on file  Stress: Not on file  Social Connections: Not on file  Intimate Partner Violence: Not At Risk (03/14/2023)   Humiliation, Afraid, Rape, and Kick questionnaire  Fear of Current or Ex-Partner: No    Emotionally Abused: No    Physically Abused: No    Sexually Abused: No  Depression (PHQ2-9): Low Risk (09/21/2024)   Depression (PHQ2-9)    PHQ-2 Score: 0  Alcohol Screen: Not on file  Housing: Low Risk (03/14/2023)   Housing    Last Housing Risk Score: 0  Utilities: Not At Risk (03/14/2023)   AHC Utilities    Threatened with loss of utilities: No  Health Literacy: Not on file   Family History  Problem Relation Age of Onset   Breast cancer Mother    Hypertension Father    CVA Father    Heart failure Sister    CAD Sister    Kidney disease Sister    COPD Brother    Lung cancer Brother    CAD Brother    Diabetes Brother    Stroke Father    Heart attack Neg Hx       VITAL SIGNS BP (!) 148/76   Pulse 75   Temp (!) 97.3 F (36.3 C)   Resp 17   Ht 4' 6 (1.372 m)   Wt 139 lb 3.2  oz (63.1 kg)   SpO2 97%   BMI 33.56 kg/m   Outpatient Encounter Medications as of 11/11/2024  Medication Sig   acetaminophen  (TYLENOL ) 325 MG tablet Take 650 mg by mouth 3 (three) times daily.   ammonium lactate (LAC-HYDRIN) 12 % lotion Apply 1 Application topically every other day. Special Instructions: Apply to bilateral feet and around toenails QOD. Once A Day Every Other Day; 07:15 AM - 03:15 PM   Balsam [place]-Castor Oil (VENELEX) OINT Apply topically.  topical, Every Shift, Apply to sacrum, bilateral buttocks and bilateral ischial areas qshift for prevention.   cyanocobalamin  (VITAMIN B12) 1000 MCG tablet Take 1,000 mcg by mouth daily.   ergocalciferol  (VITAMIN D2) 1.25 MG (50000 UT) capsule Take 50,000 Units by mouth once a week. On Tuesday   levothyroxine  (SYNTHROID ) 50 MCG tablet Take 50 mcg by mouth daily before breakfast.   memantine  (NAMENDA  XR) 7 MG CP24 24 hr capsule Take 7 mg by mouth daily.   Menthol, Topical Analgesic, (BIOFREEZE) 4 % GEL Apply 1 application  topically 3 (three) times daily as needed.   NON FORMULARY Diet: Regular, thin liquids- Add prune juice to Breakfast tray   Nutritional Supplement LIQD Take 237 mLs by mouth daily.   omeprazole (PRILOSEC) 20 MG capsule Take 20 mg by mouth 2 (two) times daily.   oxymetazoline (AFRIN) 0.05 % nasal spray Place 1 spray into both nostrils daily as needed for congestion.   Phenylephrine  HCl (PREPARATION H RE) Place 1 suppository rectally at bedtime as needed. PRN 1, PRN 2, PRN 3, PRN 4, PRN 5, PRN 6   polyethylene glycol (MIRALAX  / GLYCOLAX ) 17 g packet Take 17 g by mouth daily.   sennosides-docusate sodium  (SENOKOT-S) 8.6-50 MG tablet Take 1 tablet by mouth daily.   sertraline  (ZOLOFT ) 25 MG tablet Take 25 mg by mouth daily.   sodium chloride  (OCEAN) 0.65 % SOLN nasal spray Place 1 spray into both nostrils daily. And as needed   Cholecalciferol (VITAMIN D -3) 25 MCG (1000 UT) CAPS Take 1 capsule by mouth daily. (Patient not  taking: Reported on 11/11/2024)   No facility-administered encounter medications on file as of 11/11/2024.     SIGNIFICANT DIAGNOSTIC EXAMS  LABS REVIEWED PREVIOUS   12-30-23: glucose 141; bun 29; creat 0.90; k+ 5.2; na++ 139; ca 9.7; gfr >  60  01-06-24: glucose 100; bun 25; creat 0.96; k+ 3.8; na++ 139; ca 9.0; gfr 56 01-30-24: wbc 9.7; hgb 14.2; hct 43.4; mcv 92.1 plt 221; glucose 100; bun 24; creat 0.86; k+ 3.9; na++ 137' ca 9.1; gfr >60; total protein 6.5; albumin 3.4   TODAY  07-21-24: wbc 10.9; hgb 13.2; hct 40.9; mcv 93.2 plt 217; glucose 124; bun 19;c reat 0.98; k+ 3.8; na++ 140; ca 9.4 gfr 54; protein 6.7 albumin 3.9    Review of Systems  Constitutional:  Negative for malaise/fatigue.  Respiratory:  Negative for cough and shortness of breath.   Cardiovascular:  Negative for chest pain, palpitations and leg swelling.  Gastrointestinal:  Negative for abdominal pain, constipation and heartburn.  Musculoskeletal:  Negative for back pain, joint pain and myalgias.  Skin: Negative.   Neurological:  Negative for dizziness.  Psychiatric/Behavioral:  The patient is not nervous/anxious.     Physical Exam Constitutional:      General: She is not in acute distress.    Appearance: She is well-developed. She is not diaphoretic.  Neck:     Thyroid : No thyromegaly.  Cardiovascular:     Rate and Rhythm: Normal rate and regular rhythm.     Heart sounds: Normal heart sounds.  Pulmonary:     Effort: Pulmonary effort is normal. No respiratory distress.     Breath sounds: Normal breath sounds.  Abdominal:     General: Bowel sounds are normal. There is no distension.     Palpations: Abdomen is soft.     Tenderness: There is no abdominal tenderness.  Musculoskeletal:     Cervical back: Neck supple.     Right lower leg: No edema.     Left lower leg: No edema.     Comments:  Left hemiplegia    Lymphadenopathy:     Cervical: No cervical adenopathy.  Skin:    General: Skin is warm and dry.   Neurological:     Mental Status: She is alert. Mental status is at baseline.  Psychiatric:        Mood and Affect: Mood normal.      ASSESSMENT/ PLAN:  TODAY  Vascular dementia without behavioral disturbance/neurocognitive deficits: weight is 139 pounds; is on namenda  xr 7 mg daily; is off aricept  due to weight loss   2.  Cerebral atrophy:   MRI 05-29-21: IMPRESSION: 1. Acute right pontine infarct. Mild associated edema without mass effect. 2. Multiple small remote bilateral cerebellar lacunar infarcts. 3. Mild for age chronic microvascular ischemic disease and moderate atrophy.  3. Somnolence: is post stroke; is no longer on medications.   PREVIOUS   4. Hypoalbuminemia due to protein calorie malnutrition: albumin 3.9 will continue supplements as directed  5. Vitamin B 12: deficiency: level 491 will continue 2,000 units daily  6. Vitamin D  deficiency: level 34.06; will continue 1,000 units daily   7. Mixed hyperlipidemia: is off statin due to advanced age  64. Psychosis in elderly without behavioral disturbance: is off seroquel   9. Depression recurrent: will continue zoloft  25 mg daily has failed one wean.   10. Chronic constipation: will continue senna s daily miralax  daily   11. Thoracic aortic atherosclerosis (cxr 12-15-14) not on statin due to advanced age.   12. History of CVA/left hemiplegia: is off asa due to GI bleed.   13. Chronic pain syndrome: will continue tylenol  650 mg every 6 hours  14. Dysphagia: no indications of aspiration present; will continue thin liquids  15. Stage 3a chronic  kidney disease; bun 19; creat 0.98; gfr 54   16.  Gastrointestinal hemorrhage associated with diverticulosis(May 2024)/GERD without esophagitis: her bleeding was felt to related to diverticulitis; will continue prilosec 20 mg daily; and hgb remains stable.   17. Interstitial lung disease: no reports of respiratory distress; she is on room air.   18. HCOM( hypertrophic  obstructive cardiomyopathy) without change will monitor        Barnie Seip NP [facility]  call [phone]      [1]  Allergies Allergen Reactions   Ace Inhibitors     Hyperkalemia    Hydrochlorothiazide     05/29/2021 K+ 2.6 Other reaction(s): Other (See Comments) 05/29/2021 K+ 2.6   Codeine Nausea Only   "
